# Patient Record
Sex: Female | Born: 1955 | Race: White | Hispanic: No | Smoking: Current every day smoker
Health system: Southern US, Community
[De-identification: ages and names within clinical notes are randomized; demographics above are authoritative.]

## PROBLEM LIST (undated history)

## (undated) DIAGNOSIS — Z8669 Personal history of other diseases of the nervous system and sense organs: Secondary | ICD-10-CM

## (undated) DIAGNOSIS — F419 Anxiety disorder, unspecified: Secondary | ICD-10-CM

## (undated) DIAGNOSIS — F32A Depression, unspecified: Secondary | ICD-10-CM

## (undated) DIAGNOSIS — E785 Hyperlipidemia, unspecified: Secondary | ICD-10-CM

## (undated) DIAGNOSIS — I25110 Atherosclerotic heart disease of native coronary artery with unstable angina pectoris: Secondary | ICD-10-CM

## (undated) DIAGNOSIS — K219 Gastro-esophageal reflux disease without esophagitis: Secondary | ICD-10-CM

## (undated) DIAGNOSIS — E669 Obesity, unspecified: Secondary | ICD-10-CM

## (undated) DIAGNOSIS — J431 Panlobular emphysema: Secondary | ICD-10-CM

## (undated) DIAGNOSIS — J449 Chronic obstructive pulmonary disease, unspecified: Secondary | ICD-10-CM

## (undated) HISTORY — DX: Atherosclerotic heart disease of native coronary artery with unstable angina pectoris: I25.110

## (undated) HISTORY — DX: Panlobular emphysema: J43.1

---

## 1998-03-03 ENCOUNTER — Ambulatory Visit (HOSPITAL_COMMUNITY): Admission: RE | Admit: 1998-03-03 | Discharge: 1998-03-03 | Payer: Self-pay | Admitting: Orthopedic Surgery

## 1998-07-16 ENCOUNTER — Encounter: Admission: RE | Admit: 1998-07-16 | Discharge: 1998-07-16 | Payer: Self-pay | Admitting: Infectious Diseases

## 2003-02-13 ENCOUNTER — Encounter: Admission: RE | Admit: 2003-02-13 | Discharge: 2003-02-13 | Payer: Self-pay | Admitting: Internal Medicine

## 2003-02-13 ENCOUNTER — Encounter: Payer: Self-pay | Admitting: Internal Medicine

## 2008-05-10 ENCOUNTER — Emergency Department (HOSPITAL_COMMUNITY): Admission: EM | Admit: 2008-05-10 | Discharge: 2008-05-10 | Payer: Self-pay | Admitting: Emergency Medicine

## 2009-04-21 ENCOUNTER — Other Ambulatory Visit: Admission: RE | Admit: 2009-04-21 | Discharge: 2009-04-21 | Payer: Self-pay | Admitting: Diagnostic Radiology

## 2009-04-21 ENCOUNTER — Encounter (INDEPENDENT_AMBULATORY_CARE_PROVIDER_SITE_OTHER): Payer: Self-pay | Admitting: Diagnostic Radiology

## 2009-04-21 ENCOUNTER — Encounter: Admission: RE | Admit: 2009-04-21 | Discharge: 2009-04-21 | Payer: Self-pay | Admitting: Family Medicine

## 2010-05-18 ENCOUNTER — Ambulatory Visit (HOSPITAL_COMMUNITY): Admission: RE | Admit: 2010-05-18 | Discharge: 2010-05-18 | Payer: Self-pay | Admitting: Family Medicine

## 2011-11-25 ENCOUNTER — Ambulatory Visit: Payer: BC Managed Care – PPO

## 2011-11-25 ENCOUNTER — Ambulatory Visit (INDEPENDENT_AMBULATORY_CARE_PROVIDER_SITE_OTHER): Payer: BC Managed Care – PPO | Admitting: Internal Medicine

## 2011-11-25 VITALS — BP 130/80 | HR 78 | Temp 98.5°F | Resp 16 | Ht <= 58 in | Wt 124.8 lb

## 2011-11-25 DIAGNOSIS — R071 Chest pain on breathing: Secondary | ICD-10-CM

## 2011-11-25 DIAGNOSIS — E785 Hyperlipidemia, unspecified: Secondary | ICD-10-CM

## 2011-11-25 DIAGNOSIS — F430 Acute stress reaction: Secondary | ICD-10-CM | POA: Insufficient documentation

## 2011-11-25 DIAGNOSIS — R059 Cough, unspecified: Secondary | ICD-10-CM

## 2011-11-25 DIAGNOSIS — K589 Irritable bowel syndrome without diarrhea: Secondary | ICD-10-CM

## 2011-11-25 DIAGNOSIS — F17200 Nicotine dependence, unspecified, uncomplicated: Secondary | ICD-10-CM

## 2011-11-25 DIAGNOSIS — Z720 Tobacco use: Secondary | ICD-10-CM

## 2011-11-25 DIAGNOSIS — R05 Cough: Secondary | ICD-10-CM

## 2011-11-25 DIAGNOSIS — F411 Generalized anxiety disorder: Secondary | ICD-10-CM

## 2011-11-25 DIAGNOSIS — K219 Gastro-esophageal reflux disease without esophagitis: Secondary | ICD-10-CM

## 2011-11-25 HISTORY — DX: Hyperlipidemia, unspecified: E78.5

## 2011-11-25 MED ORDER — AZITHROMYCIN 500 MG PO TABS: 500.0000 mg | ORAL_TABLET | Freq: Every day | ORAL | Status: AC

## 2011-11-25 MED ORDER — HYDROCODONE-HOMATROPINE 5-1.5 MG/5ML PO SYRP: 5.0000 mL | ORAL_SOLUTION | Freq: Four times a day (QID) | ORAL | Status: AC | PRN

## 2011-11-25 NOTE — Progress Notes (Signed)
  Subjective:    Patient ID: Colleen Fleming, female    DOB: [birth year], 56 y.o.   MRN: [id number]  HPI63 year old smoker comes for her first visit here with a history of 4 days of chest pain. For the last 2 days she hurts whenever she takes a deep breath or when she rolls over in the left side of her chest posterior laterally. She has no history of heart disease or hypertension but is on medicines for hyperlipidemia. She has less energy and a decreased appetite with this illness but notes no fever. She does report night sweats. She has had a cold for 2 or 3 weeks.    Review of SystemsBesides decreased appetite she has increased ureteral bowel symptoms. She has a diagnosis of IBS. She also is on omeprazole for GERD.     Objective:   Physical Exam Vital signs are stable HEENT is clear the neck is supple with no adenopathy The lungs are clear except diminished breath sounds in the left axilla She is very tender to palpation along the left posterolateral chest wall There is no ecchymosis or rib defect felt The heart is regular without murmurs rubs or gallops    [facility] reading (PRIMARY) by  Dr. Merla Riches: no clear infiltrate ? Fx 6th rib in axillary line     Assessment & Plan:  Problem #1 chest wall pain Problem #2 cough secondary to bronchitis  Plan Zithromax and Hycodan with close followup

## 2012-10-03 DIAGNOSIS — C3412 Malignant neoplasm of upper lobe, left bronchus or lung: Secondary | ICD-10-CM

## 2012-10-03 HISTORY — DX: Malignant neoplasm of upper lobe, left bronchus or lung: C34.12

## 2013-08-11 DIAGNOSIS — R569 Unspecified convulsions: Secondary | ICD-10-CM | POA: Insufficient documentation

## 2013-08-21 DIAGNOSIS — C3490 Malignant neoplasm of unspecified part of unspecified bronchus or lung: Secondary | ICD-10-CM | POA: Insufficient documentation

## 2015-01-03 DIAGNOSIS — E669 Obesity, unspecified: Secondary | ICD-10-CM | POA: Insufficient documentation

## 2016-10-03 DIAGNOSIS — M47814 Spondylosis without myelopathy or radiculopathy, thoracic region: Secondary | ICD-10-CM

## 2016-10-03 HISTORY — DX: Spondylosis without myelopathy or radiculopathy, thoracic region: M47.814

## 2021-01-30 ENCOUNTER — Inpatient Hospital Stay (HOSPITAL_COMMUNITY)
Admission: EM | Admit: 2021-01-30 | Discharge: 2021-02-04 | DRG: 247 | Disposition: A | Discharge status: Home Health | Payer: Medicaid Other | Attending: Cardiology | Admitting: Cardiology

## 2021-01-30 ENCOUNTER — Encounter: Payer: Self-pay | Admitting: Cardiology

## 2021-01-30 ENCOUNTER — Inpatient Hospital Stay (HOSPITAL_COMMUNITY)
Admission: EM | Disposition: A | Discharge status: Home Health | Payer: Self-pay | Source: Home / Self Care | Attending: Cardiology

## 2021-01-30 DIAGNOSIS — I25110 Atherosclerotic heart disease of native coronary artery with unstable angina pectoris: Secondary | ICD-10-CM | POA: Diagnosis not present

## 2021-01-30 DIAGNOSIS — J431 Panlobular emphysema: Secondary | ICD-10-CM

## 2021-01-30 DIAGNOSIS — Z809 Family history of malignant neoplasm, unspecified: Secondary | ICD-10-CM

## 2021-01-30 DIAGNOSIS — F17210 Nicotine dependence, cigarettes, uncomplicated: Secondary | ICD-10-CM | POA: Diagnosis present

## 2021-01-30 DIAGNOSIS — Z85118 Personal history of other malignant neoplasm of bronchus and lung: Secondary | ICD-10-CM

## 2021-01-30 DIAGNOSIS — Z880 Allergy status to penicillin: Secondary | ICD-10-CM

## 2021-01-30 DIAGNOSIS — Z79899 Other long term (current) drug therapy: Secondary | ICD-10-CM

## 2021-01-30 DIAGNOSIS — J449 Chronic obstructive pulmonary disease, unspecified: Secondary | ICD-10-CM | POA: Diagnosis present

## 2021-01-30 DIAGNOSIS — I313 Pericardial effusion (noninflammatory): Secondary | ICD-10-CM | POA: Diagnosis not present

## 2021-01-30 DIAGNOSIS — I498 Other specified cardiac arrhythmias: Secondary | ICD-10-CM | POA: Diagnosis present

## 2021-01-30 DIAGNOSIS — Z923 Personal history of irradiation: Secondary | ICD-10-CM

## 2021-01-30 DIAGNOSIS — G40909 Epilepsy, unspecified, not intractable, without status epilepticus: Secondary | ICD-10-CM | POA: Diagnosis present

## 2021-01-30 DIAGNOSIS — I2102 ST elevation (STEMI) myocardial infarction involving left anterior descending coronary artery: Secondary | ICD-10-CM | POA: Diagnosis not present

## 2021-01-30 DIAGNOSIS — K589 Irritable bowel syndrome without diarrhea: Secondary | ICD-10-CM | POA: Diagnosis present

## 2021-01-30 DIAGNOSIS — E876 Hypokalemia: Secondary | ICD-10-CM | POA: Diagnosis present

## 2021-01-30 DIAGNOSIS — I2510 Atherosclerotic heart disease of native coronary artery without angina pectoris: Secondary | ICD-10-CM | POA: Diagnosis present

## 2021-01-30 DIAGNOSIS — F419 Anxiety disorder, unspecified: Secondary | ICD-10-CM | POA: Diagnosis present

## 2021-01-30 DIAGNOSIS — I2109 ST elevation (STEMI) myocardial infarction involving other coronary artery of anterior wall: Secondary | ICD-10-CM | POA: Diagnosis present

## 2021-01-30 DIAGNOSIS — Z23 Encounter for immunization: Secondary | ICD-10-CM | POA: Diagnosis not present

## 2021-01-30 DIAGNOSIS — E785 Hyperlipidemia, unspecified: Secondary | ICD-10-CM | POA: Diagnosis present

## 2021-01-30 DIAGNOSIS — F32A Depression, unspecified: Secondary | ICD-10-CM | POA: Diagnosis present

## 2021-01-30 DIAGNOSIS — I950 Idiopathic hypotension: Secondary | ICD-10-CM | POA: Diagnosis not present

## 2021-01-30 DIAGNOSIS — E7800 Pure hypercholesterolemia, unspecified: Secondary | ICD-10-CM | POA: Diagnosis present

## 2021-01-30 DIAGNOSIS — Z9221 Personal history of antineoplastic chemotherapy: Secondary | ICD-10-CM | POA: Diagnosis not present

## 2021-01-30 DIAGNOSIS — K219 Gastro-esophageal reflux disease without esophagitis: Secondary | ICD-10-CM | POA: Diagnosis present

## 2021-01-30 DIAGNOSIS — M47814 Spondylosis without myelopathy or radiculopathy, thoracic region: Secondary | ICD-10-CM | POA: Diagnosis present

## 2021-01-30 DIAGNOSIS — Z20822 Contact with and (suspected) exposure to covid-19: Secondary | ICD-10-CM | POA: Diagnosis present

## 2021-01-30 DIAGNOSIS — Z955 Presence of coronary angioplasty implant and graft: Secondary | ICD-10-CM

## 2021-01-30 DIAGNOSIS — I213 ST elevation (STEMI) myocardial infarction of unspecified site: Secondary | ICD-10-CM | POA: Diagnosis present

## 2021-01-30 HISTORY — DX: Personal history of other diseases of the nervous system and sense organs: Z86.69

## 2021-01-30 HISTORY — DX: Depression, unspecified: F32.A

## 2021-01-30 HISTORY — DX: Anxiety disorder, unspecified: F41.9

## 2021-01-30 HISTORY — DX: Gastro-esophageal reflux disease without esophagitis: K21.9

## 2021-01-30 HISTORY — DX: Chronic obstructive pulmonary disease, unspecified: J44.9

## 2021-01-30 HISTORY — PX: LEFT HEART CATH AND CORONARY ANGIOGRAPHY: CATH118249

## 2021-01-30 HISTORY — DX: Hyperlipidemia, unspecified: E78.5

## 2021-01-30 HISTORY — DX: Obesity, unspecified: E66.9

## 2021-01-30 HISTORY — PX: CORONARY/GRAFT ACUTE MI REVASCULARIZATION: CATH118305

## 2021-01-30 HISTORY — DX: ST elevation (STEMI) myocardial infarction involving left anterior descending coronary artery: I21.02

## 2021-01-30 LAB — POCT I-STAT, CHEM 8
BUN: <3 mg/dL — ABNORMAL LOW (ref 8–23)
BUN: <3 mg/dL — ABNORMAL LOW (ref 8–23)
Calcium, Ion: 1.15 mmol/L (ref 1.15–1.40)
Calcium, Ion: 1.17 mmol/L (ref 1.15–1.40)
Chloride: 100 mmol/L (ref 98–111)
Chloride: 88 mmol/L — ABNORMAL LOW (ref 98–111)
Creatinine, Ser: 0.4 mg/dL — ABNORMAL LOW (ref 0.44–1.00)
Creatinine, Ser: 0.6 mg/dL (ref 0.44–1.00)
Glucose, Bld: 127 mg/dL — ABNORMAL HIGH (ref 70–99)
Glucose, Bld: 155 mg/dL — ABNORMAL HIGH (ref 70–99)
HCT: 34 % — ABNORMAL LOW (ref 36.0–46.0)
HCT: 38 % (ref 36.0–46.0)
Hemoglobin: 11.6 g/dL — ABNORMAL LOW (ref 12.0–15.0)
Hemoglobin: 12.9 g/dL (ref 12.0–15.0)
Potassium: 2.1 mmol/L — CL (ref 3.5–5.1)
Potassium: 2.7 mmol/L — CL (ref 3.5–5.1)
Sodium: 125 mmol/L — ABNORMAL LOW (ref 135–145)
Sodium: 135 mmol/L (ref 135–145)
TCO2: 19 mmol/L — ABNORMAL LOW (ref 22–32)
TCO2: 19 mmol/L — ABNORMAL LOW (ref 22–32)

## 2021-01-30 LAB — CBC
HCT: 35.5 % — ABNORMAL LOW (ref 36.0–46.0)
Hemoglobin: 12.6 g/dL (ref 12.0–15.0)
MCH: 31.8 pg (ref 26.0–34.0)
MCHC: 35.5 g/dL (ref 30.0–36.0)
MCV: 89.6 fL (ref 80.0–100.0)
Platelets: 340 10*3/uL (ref 150–400)
RBC: 3.96 MIL/uL (ref 3.87–5.11)
RDW: 12.1 % (ref 11.5–15.5)
WBC: 14.1 10*3/uL — ABNORMAL HIGH (ref 4.0–10.5)
nRBC: 0 % (ref 0.0–0.2)

## 2021-01-30 LAB — HIV ANTIBODY (ROUTINE TESTING W REFLEX): HIV Screen 4th Generation wRfx: NONREACTIVE

## 2021-01-30 LAB — TROPONIN I (HIGH SENSITIVITY): Troponin I (High Sensitivity): >27000 ng/L (ref <18)

## 2021-01-30 LAB — RESP PANEL BY RT-PCR (FLU A&B, COVID) ARPGX2
Influenza A by PCR: NEGATIVE
Influenza B by PCR: NEGATIVE
SARS Coronavirus 2 by RT PCR: NEGATIVE

## 2021-01-30 LAB — POCT ACTIVATED CLOTTING TIME: Activated Clotting Time: 285 seconds

## 2021-01-30 LAB — CREATININE, SERUM
Creatinine, Ser: 0.73 mg/dL (ref 0.44–1.00)
GFR, Estimated: >60 mL/min (ref >60)

## 2021-01-30 SURGERY — CORONARY/GRAFT ACUTE MI REVASCULARIZATION
Anesthesia: LOCAL

## 2021-01-30 MED ORDER — SODIUM CHLORIDE 0.9% FLUSH
10.0000 mL | Freq: Two times a day (BID) | INTRAVENOUS | Status: DC
  Administered 2021-01-30 – 2021-02-03 (×8): 10 mL

## 2021-01-30 MED ORDER — SODIUM CHLORIDE 0.9% FLUSH
3.0000 mL | Freq: Two times a day (BID) | INTRAVENOUS | Status: DC
  Administered 2021-01-31 – 2021-02-03 (×6): 3 mL via INTRAVENOUS

## 2021-01-30 MED ORDER — TIROFIBAN HCL IN NACL 5-0.9 MG/100ML-% IV SOLN
INTRAVENOUS | Status: AC | PRN
  Administered 2021-01-30: 0.15 ug/kg/min via INTRAVENOUS

## 2021-01-30 MED ORDER — TIROFIBAN (AGGRASTAT) BOLUS VIA INFUSION
INTRAVENOUS | Status: DC | PRN
  Administered 2021-01-30: 1075 ug via INTRAVENOUS

## 2021-01-30 MED ORDER — CHLORHEXIDINE GLUCONATE CLOTH 2 % EX PADS
6.0000 | MEDICATED_PAD | Freq: Every day | CUTANEOUS | Status: DC
  Administered 2021-01-30 – 2021-02-03 (×5): 6 via TOPICAL

## 2021-01-30 MED ORDER — SODIUM CHLORIDE 0.9 % IV SOLN: INTRAVENOUS | Status: AC

## 2021-01-30 MED ORDER — LABETALOL HCL 5 MG/ML IV SOLN: 10.0000 mg | INTRAVENOUS | Status: AC | PRN

## 2021-01-30 MED ORDER — ACETAMINOPHEN 325 MG PO TABS: 650.0000 mg | ORAL_TABLET | ORAL | Status: DC | PRN

## 2021-01-30 MED ORDER — NITROGLYCERIN 0.4 MG SL SUBL: 0.4000 mg | SUBLINGUAL_TABLET | SUBLINGUAL | Status: DC | PRN

## 2021-01-30 MED ORDER — SODIUM CHLORIDE 0.9 % IV SOLN: 250.0000 mL | INTRAVENOUS | Status: DC | PRN

## 2021-01-30 MED ORDER — IOHEXOL 350 MG/ML SOLN
INTRAVENOUS | Status: AC
  Filled 2021-01-30: qty 1

## 2021-01-30 MED ORDER — IOHEXOL 350 MG/ML SOLN
INTRAVENOUS | Status: DC | PRN
Start: 2021-01-30 — End: 2021-01-30
  Administered 2021-01-30: 125 mL

## 2021-01-30 MED ORDER — HEPARIN (PORCINE) IN NACL 1000-0.9 UT/500ML-% IV SOLN
INTRAVENOUS | Status: AC
  Filled 2021-01-30: qty 1000

## 2021-01-30 MED ORDER — SODIUM CHLORIDE 0.9% FLUSH: 10.0000 mL | INTRAVENOUS | Status: DC | PRN

## 2021-01-30 MED ORDER — ONDANSETRON HCL 4 MG/2ML IJ SOLN
INTRAMUSCULAR | Status: AC
  Filled 2021-01-30: qty 2

## 2021-01-30 MED ORDER — METOPROLOL TARTRATE 12.5 MG HALF TABLET
12.5000 mg | ORAL_TABLET | Freq: Two times a day (BID) | ORAL | Status: DC
  Administered 2021-01-30 – 2021-01-31 (×3): 12.5 mg via ORAL
  Filled 2021-01-30 (×3): qty 1

## 2021-01-30 MED ORDER — VERAPAMIL HCL 2.5 MG/ML IV SOLN
INTRAVENOUS | Status: AC
  Filled 2021-01-30: qty 2

## 2021-01-30 MED ORDER — HEPARIN SODIUM (PORCINE) 5000 UNIT/ML IJ SOLN
5000.0000 [IU] | Freq: Three times a day (TID) | INTRAMUSCULAR | Status: DC
  Filled 2021-01-30 (×2): qty 1

## 2021-01-30 MED ORDER — NITROGLYCERIN 1 MG/10 ML FOR IR/CATH LAB
INTRA_ARTERIAL | Status: AC
  Filled 2021-01-30: qty 10

## 2021-01-30 MED ORDER — ASPIRIN EC 81 MG PO TBEC
81.0000 mg | DELAYED_RELEASE_TABLET | Freq: Every day | ORAL | Status: DC
  Administered 2021-01-31 – 2021-02-04 (×5): 81 mg via ORAL
  Filled 2021-01-30 (×5): qty 1

## 2021-01-30 MED ORDER — TICAGRELOR 90 MG PO TABS
ORAL_TABLET | ORAL | Status: AC
  Filled 2021-01-30: qty 2

## 2021-01-30 MED ORDER — HYDRALAZINE HCL 20 MG/ML IJ SOLN: 10.0000 mg | INTRAMUSCULAR | Status: AC | PRN

## 2021-01-30 MED ORDER — VENLAFAXINE HCL 37.5 MG PO TABS: 37.5000 mg | ORAL_TABLET | Freq: Once | ORAL | Status: DC

## 2021-01-30 MED ORDER — HEPARIN SODIUM (PORCINE) 1000 UNIT/ML IJ SOLN
INTRAMUSCULAR | Status: AC
  Filled 2021-01-30: qty 1

## 2021-01-30 MED ORDER — ATROPINE SULFATE 1 MG/10ML IJ SOSY
PREFILLED_SYRINGE | INTRAMUSCULAR | Status: AC
  Filled 2021-01-30: qty 10

## 2021-01-30 MED ORDER — POTASSIUM CHLORIDE 10 MEQ/50ML IV SOLN
10.0000 meq | INTRAVENOUS | Status: AC
  Administered 2021-01-30 (×3): 10 meq via INTRAVENOUS
  Filled 2021-01-30 (×3): qty 50

## 2021-01-30 MED ORDER — ATORVASTATIN CALCIUM 80 MG PO TABS
80.0000 mg | ORAL_TABLET | Freq: Every day | ORAL | Status: DC
  Administered 2021-01-30 – 2021-02-04 (×6): 80 mg via ORAL
  Filled 2021-01-30 (×6): qty 1

## 2021-01-30 MED ORDER — TIROFIBAN HCL IN NACL 5-0.9 MG/100ML-% IV SOLN: 0.1500 ug/kg/min | INTRAVENOUS | Status: AC

## 2021-01-30 MED ORDER — MIDAZOLAM HCL 2 MG/2ML IJ SOLN
INTRAMUSCULAR | Status: DC | PRN
Start: 2021-01-30 — End: 2021-01-30
  Administered 2021-01-30: 1 mg via INTRAVENOUS

## 2021-01-30 MED ORDER — TIROFIBAN HCL IN NACL 5-0.9 MG/100ML-% IV SOLN
INTRAVENOUS | Status: AC
  Filled 2021-01-30: qty 100

## 2021-01-30 MED ORDER — FENTANYL CITRATE (PF) 100 MCG/2ML IJ SOLN
INTRAMUSCULAR | Status: AC
  Filled 2021-01-30: qty 2

## 2021-01-30 MED ORDER — FENTANYL CITRATE (PF) 100 MCG/2ML IJ SOLN
INTRAMUSCULAR | Status: DC | PRN
  Administered 2021-01-30: 25 ug via INTRAVENOUS

## 2021-01-30 MED ORDER — TICAGRELOR 90 MG PO TABS
ORAL_TABLET | ORAL | Status: DC | PRN
  Administered 2021-01-30: 180 mg via ORAL

## 2021-01-30 MED ORDER — LIDOCAINE HCL (PF) 1 % IJ SOLN
INTRAMUSCULAR | Status: AC
  Filled 2021-01-30: qty 30

## 2021-01-30 MED ORDER — MIDAZOLAM HCL 2 MG/2ML IJ SOLN
INTRAMUSCULAR | Status: AC
  Filled 2021-01-30: qty 2

## 2021-01-30 MED ORDER — MORPHINE SULFATE (PF) 2 MG/ML IV SOLN: 1.0000 mg | INTRAVENOUS | Status: DC | PRN

## 2021-01-30 MED ORDER — LIDOCAINE HCL (PF) 1 % IJ SOLN
INTRAMUSCULAR | Status: DC | PRN
  Administered 2021-01-30: 10 mL
  Administered 2021-01-30: 15 mL

## 2021-01-30 MED ORDER — ONDANSETRON HCL 4 MG/2ML IJ SOLN: 4.0000 mg | Freq: Four times a day (QID) | INTRAMUSCULAR | Status: DC | PRN

## 2021-01-30 MED ORDER — TICAGRELOR 90 MG PO TABS
90.0000 mg | ORAL_TABLET | Freq: Two times a day (BID) | ORAL | Status: DC
  Administered 2021-01-31 – 2021-02-04 (×9): 90 mg via ORAL
  Filled 2021-01-30 (×9): qty 1

## 2021-01-30 MED ORDER — DOPAMINE-DEXTROSE 3.2-5 MG/ML-% IV SOLN
INTRAVENOUS | Status: AC
  Filled 2021-01-30: qty 250

## 2021-01-30 MED ORDER — HEPARIN SODIUM (PORCINE) 1000 UNIT/ML IJ SOLN
INTRAMUSCULAR | Status: DC | PRN
Start: 2021-01-30 — End: 2021-01-30
  Administered 2021-01-30: 2000 [IU] via INTRAVENOUS

## 2021-01-30 MED ORDER — ONDANSETRON HCL 4 MG/2ML IJ SOLN
INTRAMUSCULAR | Status: DC | PRN
  Administered 2021-01-30: 4 mg via INTRAVENOUS

## 2021-01-30 MED ORDER — SODIUM CHLORIDE 0.9% FLUSH
3.0000 mL | INTRAVENOUS | Status: DC | PRN
  Administered 2021-02-03: 3 mL via INTRAVENOUS

## 2021-01-30 SURGICAL SUPPLY — 18 items
BALLN SAPPHIRE 2.0X15 (BALLOONS) ×2
BALLN SAPPHIRE ~~LOC~~ 2.75X12 (BALLOONS) ×2 IMPLANT
BALLOON SAPPHIRE 2.0X15 (BALLOONS) ×1 IMPLANT
CATH INFINITI 5FR MULTPACK ANG (CATHETERS) ×2 IMPLANT
CATH VISTA GUIDE 6FR XBLAD3.5 (CATHETERS) ×2 IMPLANT
KIT CV MULTILUMEN 7FR 20 (SET/KITS/TRAYS/PACK) ×2
KIT CV MULTILUMEN 7FR 20 SUB (SET/KITS/TRAYS/PACK) ×1 IMPLANT
KIT ENCORE 26 ADVANTAGE (KITS) ×2 IMPLANT
KIT HEART LEFT (KITS) ×2 IMPLANT
PACK CARDIAC CATHETERIZATION (CUSTOM PROCEDURE TRAY) ×2 IMPLANT
SHEATH PINNACLE 6F 10CM (SHEATH) ×2 IMPLANT
STENT SYNERGY XD 2.50X16 (Permanent Stent) ×1 IMPLANT
SYNERGY XD 2.50X16 (Permanent Stent) ×2 IMPLANT
TRANSDUCER W/STOPCOCK (MISCELLANEOUS) ×2 IMPLANT
TUBING CIL FLEX 10 FLL-RA (TUBING) ×2 IMPLANT
WIRE ASAHI PROWATER 180CM (WIRE) ×2 IMPLANT
WIRE EMERALD 3MM-J .035X150CM (WIRE) ×2 IMPLANT
WIRE RUNTHROUGH .014X180CM (WIRE) ×2 IMPLANT

## 2021-01-30 NOTE — Progress Notes (Signed)
   01/30/21 1818  Clinical Encounter Type  Visited With Patient not available  Visit Type Code  Referral From Nurse  Consult/Referral To Chaplain   Chaplain responded to Code STEMI. Pt being treated in the ED and no support person present. Chaplain remains available.   This note was prepared by Chaplain Resident, Dante Gang, MDiv. Chaplain remains available as needed through the on-call pager: [phone].

## 2021-01-30 NOTE — Progress Notes (Signed)
I called Lab for results of troponin sent at 2115.  Troponin greater than 27,000.  Patient is pain free.  Next troponin due at 0100 Paged Dr. Ellyn Hack and informed him of critical level.  No new orders will continue to monitor troponin level.

## 2021-01-30 NOTE — ED Notes (Signed)
Given 4000mg  heparin per MD verbal order

## 2021-01-30 NOTE — H&P (Addendum)
Cardiology Admission History and Physical:   Patient ID: Colleen Fleming MRN: [id number]; DOB: [birth year]   Admission date: 01/30/2021  PCP:  No primary care provider on file.   [place]  Cardiologist:  No primary care provider on file.  Advanced Practice Provider:  No care team member to display Electrophysiologist:  None       Chief Complaint:  Chest pain/STEMI  Patient Profile:   Colleen Fleming is a 65 y.o. female with history of lung cancer status post chemo and radiation on the left side, COPD, heavy tobacco abuse with a greater than 50-pack-year history and now currently smoking 2 packs/day who presents as a code STEMI.  History of Present Illness:   [name] 65 year old female with history of lung cancer status post chemo and radiation on the left side, COPD, heavy tobacco use, GERD, depression who had acute onset chest pain roughly 3 hours prior to presentation.  Noted the chest pain to be left-sided and substernal.  Progressively worsening.  Also having diaphoresis and lightheadedness.  No other acute symptoms.  Has had a cough persistently for over a year.  Notably was not not seen by a provider since 2018 and has been lost to follow-up.  Reported that she has not been taking any of her chronic medications.  Brought in via EMS.  On EKG to have ST elevation in anterior (~4-5 mm) and lateral leads (I & aVL ~3 mm).  With reciprocal depressions.  Blood pressures in the systolics 06T to 016W.  Heart rates intermittently dropping to the 50s to 60s with 2-1 AV block on telemetry.  Having active 8/10 chest pain down to the emergency department.  Cath Lab activated and brought up for intervention.   Past Medical History:  Diagnosis Date  . Anxiety and depression   . COPD (chronic obstructive pulmonary disease) (HCC)    Long-term smoker greater than 35 years 1 to 2 pack a day.  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 GERD (gastroesophageal reflux disease)   . History of seizure disorder   . Hyperlipidemia   . Obesity   . Small cell lung cancer, left upper lobe ([place]) 2014   Treated with radiation and chemotherapy [facility])   Family history unable to be obtained t given acuity. Noncontributory.   Medications Prior to Admission: Prior to Admission medications   Medication Sig Start Date End Date Taking? Authorizing Provider  hyoscyamine (ANASPAZ) 0.125 MG TBDP Place under the tongue.    [provider]  lovastatin (MEVACOR) 20 MG tablet Take 20 mg by mouth at bedtime.    [provider]  multivitamin [facility]) per tablet Take 1 tablet by mouth daily.    [provider]  omeprazole (PRILOSEC) 20 MG capsule Take 20 mg by mouth daily.    [provider]  venlafaxine (EFFEXOR) 37.5 MG tablet Take 37.5 mg by mouth once.    [provider]  -> Per her report, she has not been taking the medications for quite some time now.  Allergies:    Allergies  Allergen Reactions  . Penicillins Hives    Social History:   Social History   Socioeconomic History  . Marital status: Divorced    Spouse name: Not on file  . Number of children: 2  . Years of education: Not on file  . Highest education level: Not on file  Occupational History  . Not  on file  Tobacco Use  . Smoking status: Current Every Day Smoker    Packs/day: 1.50    Years: 40.00    Pack years: 60.00  . Smokeless tobacco: Never Used  Substance and Sexual Activity  . Alcohol use: Not Currently  . Drug use: Never  . Sexual activity: Not on file  Other Topics Concern  . Not on file  Social History Narrative   Currently divorced.  No longer working.  Previously worked in [employer].   Lives near [place].   Still smokes about 2 packs a day.   Social Determinants of Health   Financial Resource Strain: Not on file  Food Insecurity: Not on file  Transportation Needs:  Not on file  Physical Activity: Not on file  Stress: Not on file  Social Connections: Not on file  Intimate Partner Violence: Not on file   . Social History   Social History Narrative   Currently divorced.  No longer working.  Previously worked in [employer].   Lives near [place].   Still smokes about 2 packs a day.     Family History:   The patient's family history includes Cancer in her paternal aunt and paternal grandmother.    ROS:  Please see the history of present illness.  All other ROS reviewed and negative.     Physical Exam/Data:   Vitals:   01/30/21 2100 01/30/21 2115 01/30/21 2130 01/30/21 2200  BP: (!) 103/91 111/86 117/82 120/83  Pulse: 95 (!) 117 95 76  Resp: (!) 27 (!) 24 (!) 27 (!) 27  Temp:      TempSrc:      SpO2: 95% 93% 100% 98%   No intake or output data in the 24 hours ending 01/30/21 2232 Last 3 Weights 11/25/2011  Weight (lbs) 124 lb 12.8 oz  Weight (kg) 56.609 kg     There is no height or weight on file to calculate BMI.  Per her report, 4 feet, 9 inches General:  Pale appearing and in pain HEENT: normal Lymph: no adenopathy Neck: no JVD Endocrine:  No thryomegaly Vascular: No carotid bruits; FA pulses 2+ bilaterally without bruits  Cardiac:  normal S1, S2; RRR; no murmur  Lungs:  clear to auscultation bilaterally, no wheezing, rhonchi or rales  Abd: soft, nontender, no hepatomegaly  Ext: no edema Musculoskeletal:  No deformities, BUE and BLE strength normal and equal Skin: warm and dry  Neuro:  CNs 2-12 intact, no focal abnormalities noted Psych:  Normal affect    EKG:  The ECG that was done  was personally reviewed and demonstrates ST elevation anterior leads  Relevant CV Studies: none  Laboratory Data:  High Sensitivity Troponin:  No results for input(s): TROPONINIHS in the last 720 hours.    Chemistry Recent Labs  Lab 01/30/21 1911 01/30/21 1924  NA 135 125*  K 2.7* 2.1*  CL 100 88*  GLUCOSE 155* 127*   BUN <3* <3*  CREATININE 0.60 0.40*    No results for input(s): PROT, ALBUMIN, AST, ALT, ALKPHOS, BILITOT in the last 168 hours. Hematology Recent Labs  Lab 01/30/21 1924 01/30/21 2115  WBC  --  14.1*  RBC  --  3.96  HGB 11.6* 12.6  HCT 34.0* 35.5*  MCV  --  89.6  MCH  --  31.8  MCHC  --  35.5  RDW  --  12.1  PLT  --  340   BNPNo results for input(s): BNP, PROBNP  in the last 168 hours.  DDimer No results for input(s): DDIMER in the last 168 hours.   Radiology/Studies:  CARDIAC CATHETERIZATION  Result Date: 01/30/2021  The left ventricular ejection fraction is 45-50% by visual estimate. Mid to apical anterior hypokinesis.  LV end diastolic pressure is mildly elevated at 13 mmHg, with systolic 98 mmHg.  There is trivial (1+) mitral regurgitation.  --------------------------------------------  Culprit lesion: Prox LAD lesion is 99% stenosed. (Calcified, thrombotic)  A drug-eluting stent was successfully placed using a SYNERGY XD 2.50X16 -> postdilated 2.8 mm  Post intervention, there is a 0% residual stenosis.  Dist LAD lesion is 80% stenosed -> there is TIMI 0 flow to the apex, post PCI TIMI II flow, but still notable irregularities distally, likely distal embolism.  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  Distal LAD has TIMI I 0 flow.  Successful DES PCI of the LAD using a synergy DES 2.5 mm x 16 mm postdilated to 2.8 mm.  Otherwise angiographically normal coronary arteries with exception of the apical LAD.  Mild mid to apical anterior hypokinesis on LV gram with mildly reduced EF of roughly 45%.  Upper limit of normal LVEDP.  Borderline hypotension with systolic pressures ranging in the 95 to 105 mmHg range -> no evidence of shock  Severe hypOkalemia, i-STAT potassium read is 2.1, labs from ER read is 2.7.  Resolution of ventricular bigeminy. RECOMMENDATION  Admit to CVICU  Will run 3 rounds  of IV potassium and recheck in 3 hours.  Continue IV Aggrastat for another 1 hour post PCI.  Check 2D echocardiogram, A1c and lipid panel the morning.  High-dose high intensity statin  Will attempt to start low-dose beta-blocker given tachycardia, however with borderline pressures, may need to hold.  Cardiac rehab consult  Smoking cessation consult Glenetta Hew, MD    Assessment and Plan:   1. STEMI. Brought to the Cath lab and notably has LAD occlusion that will undergo PCI at this time. Loaded with ASA and started on heparin in the ED. We will continue daily asa, HD statin. No BB for now given AVB in ED. Ordered echo for post cath. Will monitor closely post procedure.  Cycle troponin levels 2. Hypokalemia-potassium 2.1 on i-STAT, 2.7 on ER labs.  Will replete.   Risk Assessment/Risk Scores:     TIMI Risk Score for ST  Elevation MI:   The patient's TIMI risk score is 8, which indicates a 26.8% risk of all cause mortality at 30 days.        Severity of Illness: The appropriate patient status for this patient is INPATIENT. Inpatient status is judged to be reasonable and necessary in order to provide the required intensity of service to ensure the patient's safety. The patient's presenting symptoms, physical exam findings, and initial radiographic and laboratory data in the context of their chronic comorbidities is felt to place them at high risk for further clinical deterioration. Furthermore, it is not anticipated that the patient will be medically stable for discharge from the hospital within 2 midnights of admission. The following factors support the patient status of inpatient.   " The patient's presenting symptoms include chest pain. " The worrisome physical exam findings include chest pain, SOB, distress. " The initial radiographic and laboratory data are worrisome because of STEMI - anterior ST elevations on EKG " The chronic co-morbidities include lung cancer, COPD, heavy  tobacco abuse.   * I certify  that at the point of admission it is my clinical judgment that the patient will require inpatient hospital care spanning beyond 2 midnights from the point of admission due to high intensity of service, high risk for further deterioration and high frequency of surveillance required.*    For questions or updates, please contact [place] Please consult [email or web address] for contact info under     Signed, Doyne Keel, MD  01/30/2021 7:16 PM   ATTENDING ATTESTATION  I have seen, examined and evaluated the patient this evening along with Dr. Marcelle Smiling.  After reviewing all the available data and chart, we discussed the patients laboratory, study & physical findings as well as symptoms in detail. I agree with his findings, examination as well as impression recommendations as per our discussion.    Attending adjustments noted in italics.   I personally performed the chart review including past medical history and medication review.  Also performed the interview and examination along with Dr.Narcisse.  With significant anterior ST elevation on EKG and ectopy on rhythm, she is taken to the cardiac catheterization lab with presumed anterior/LAD involvement- > confirmed on cardiac cath-LAD PCI.Marland Kitchen  We will need to monitor on telemetry post cath.  Labs been drawn, not available until i-STAT in the Cath Lab where she was found to have hypokalemia. . Will need to cycle troponin levels and check echocardiogram the morning. Potassium supplementation has been written for. Take lipid panel start statin Attempt to start low-dose beta-blocker tonight. Needs cardiac rehab and smoking cessation counseling.    Glenetta Hew, M.D     Glenetta Hew, MD., M.S. Interventional Cardiologist   [address] [place], [place] [id number] 01/30/2021 10:32 PM

## 2021-01-30 NOTE — Progress Notes (Signed)
Dr. Ellyn Hack at bedside.  Orders to stop aggrastat in one hour at 2130.  Troponin stat, another at 0100 and last troponin morning labs at 0500.

## 2021-01-30 NOTE — Progress Notes (Signed)
Patient admit to unit from cath lab via bed.  Patient A&Ox4 able to verbalize all needs. Aggratstat infusing per MD's orders.   Patient has sheath in right groin.  Right groin is benign Patient has  +2 bilateral pedal pulses.  Right leg warm and dry.  Patient denies all pain.  Patient has sensation to right leg cap refill <3 seconds.  Patient orient to room and call bell.  Patient instructed that she must lay flat until sheath has been from  Right groin for 6 hours.  Patient verbalizes understanding of all instructions.

## 2021-01-30 NOTE — ED Triage Notes (Signed)
Nausea, vomiting since this AM. Active chest pain since 1-2 hrs. Code STEMI. A&Ox4.

## 2021-01-30 NOTE — Progress Notes (Signed)
I called lab department asking for results of troponins.  They have not resulted they are still working on it.

## 2021-01-30 NOTE — ED Provider Notes (Signed)
[facility] CATH LAB Provider Note   CSN: [id number] Arrival date & time: 01/30/21  [place]     History Chief Complaint  Patient presents with  . Code STEMI    Colleen Fleming is a 65 y.o. female presented emerge department as a code STEMI.  The patient reports onset of chest pressure earlier today.  Describes like someone sitting on her chest.  Intensity is 8 out of 10.  She was given aspirin by EMS.  She is never had this pain before.  She denies any history of cardiac disease, MI, cardiac stents.  She reports she is a smoker.  She denies history of hypertension.  She does report high cholesterol.  She says she does not take any medications.  HPI     Past Medical History:  Diagnosis Date  . Anxiety and depression   . COPD (chronic obstructive pulmonary disease) (HCC)    Long-term smoker greater than 35 years 1 to 2 pack a day.  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 GERD (gastroesophageal reflux disease)   . History of seizure disorder   . Hyperlipidemia   . Obesity   . Small cell lung cancer, left upper lobe ([place]) 2014   Treated with radiation and chemotherapy [facility])    Patient Active Problem List   Diagnosis Date Noted  . STEMI (ST elevation myocardial infarction) ([place]) 01/30/2021  . GERD (gastroesophageal reflux disease) 11/25/2011  . Nicotine abuse 11/25/2011  . Hyperlipemia 11/25/2011  . IBS (irritable bowel syndrome) 11/25/2011  . Anxiety as acute reaction to exceptional stress 11/25/2011     OB History   No obstetric history on file.     Family History  Problem Relation Age of Onset  . Cancer Paternal Grandmother   . Cancer Paternal Aunt     Social History   Tobacco Use  . Smoking status: Current Every Day Smoker    Packs/day: 1.50    Years: 40.00    Pack years: 60.00  . Smokeless tobacco: Never Used  Substance Use Topics  . Alcohol use: Not Currently  . Drug use: Never     Home Medications Prior to Admission medications   Medication Sig Start Date End Date Taking? Authorizing Provider  hyoscyamine (ANASPAZ) 0.125 MG TBDP Place under the tongue.    [provider]  lovastatin (MEVACOR) 20 MG tablet Take 20 mg by mouth at bedtime.    [provider]  multivitamin [facility]) per tablet Take 1 tablet by mouth daily.    [provider]  omeprazole (PRILOSEC) 20 MG capsule Take 20 mg by mouth daily.    [provider]  venlafaxine (EFFEXOR) 37.5 MG tablet Take 37.5 mg by mouth once.    [provider]    Allergies    Penicillins  Review of Systems   Review of Systems  Constitutional: Negative for chills and fever.  Eyes: Negative for pain and visual disturbance.  Respiratory: Positive for shortness of breath. Negative for cough.   Cardiovascular: Positive for chest pain. Negative for palpitations.  Gastrointestinal: Positive for nausea. Negative for abdominal pain and vomiting.  Genitourinary: Negative for dysuria and hematuria.  Musculoskeletal: Negative for arthralgias and back pain.  Skin: Negative for color change and rash.  Neurological: Negative for seizures and syncope.  All other systems reviewed and are negative.   Physical Exam Updated Vital Signs BP 104/74 (BP Location: Right Arm)  Pulse 66   Temp (!) 97.4 F (36.3 C) (Oral)   Resp 15   SpO2 99%   Physical Exam Constitutional:      General: She is not in acute distress. HENT:     Head: Normocephalic and atraumatic.  Eyes:     Conjunctiva/sclera: Conjunctivae normal.     Pupils: Pupils are equal, round, and reactive to light.  Cardiovascular:     Rate and Rhythm: Normal rate and regular rhythm.  Pulmonary:     Effort: Pulmonary effort is normal. No respiratory distress.  Abdominal:     General: There is no distension.     Tenderness: There is no abdominal tenderness.  Skin:    General: Skin is warm and dry.  Neurological:      General: No focal deficit present.     Mental Status: She is alert. Mental status is at baseline.  Psychiatric:        Mood and Affect: Mood normal.        Behavior: Behavior normal.     ED Results / Procedures / Treatments   Labs (all labs ordered are listed, but only abnormal results are displayed) Labs Reviewed  HIV ANTIBODY (ROUTINE TESTING W REFLEX)  BASIC METABOLIC PANEL  LIPID PANEL  CBC  PROTIME-INR    EKG None  Radiology No results found.   Medications Ordered in ED Medications  aspirin EC tablet 81 mg (has no administration in time range)  nitroGLYCERIN (NITROSTAT) SL tablet 0.4 mg (has no administration in time range)  acetaminophen (TYLENOL) tablet 650 mg (has no administration in time range)  ondansetron (ZOFRAN) injection 4 mg (has no administration in time range)  atorvastatin (LIPITOR) tablet 80 mg (has no administration in time range)  lidocaine (PF) (XYLOCAINE) 1 % injection (15 mLs  Given 01/30/21 1905)  ondansetron (ZOFRAN) injection (4 mg Intravenous Given 01/30/21 1906)  fentaNYL (SUBLIMAZE) injection (25 mcg Intravenous Given 01/30/21 1907)  midazolam (VERSED) injection (1 mg Intravenous Given 01/30/21 1907)  heparin sodium (porcine) injection (2,000 Units Intravenous Given 01/30/21 1915)    ED Course  I have reviewed the triage vital signs and the nursing notes.  Pertinent labs & imaging results that were available during my care of the patient were reviewed by me and considered in my medical decision making (see chart for details).  65 year old female presented to ED as a code STEMI.  EKG on arrival shows ST elevations anterior leads consistent with myocardial infarct.  Heparin ordered by cardiology attending and staff, who are present at bedside, will be taken the patient to the Cath Lab.  Blood pressure stable.  1 L of IV fluids ordered.     Final Clinical Impression(s) / ED Diagnoses Final diagnoses:  ST elevation myocardial infarction  (STEMI), unspecified artery [facility])    Rx / DC Orders ED Discharge Orders    None       Tynlee Bayle, Carola Rhine, MD 01/30/21 Lurena Nida

## 2021-01-31 ENCOUNTER — Inpatient Hospital Stay (HOSPITAL_COMMUNITY): Payer: Medicaid Other

## 2021-01-31 ENCOUNTER — Other Ambulatory Visit: Payer: Self-pay

## 2021-01-31 DIAGNOSIS — J431 Panlobular emphysema: Secondary | ICD-10-CM | POA: Diagnosis not present

## 2021-01-31 DIAGNOSIS — I2102 ST elevation (STEMI) myocardial infarction involving left anterior descending coronary artery: Secondary | ICD-10-CM

## 2021-01-31 HISTORY — PX: TRANSTHORACIC ECHOCARDIOGRAM: SHX275

## 2021-01-31 LAB — TROPONIN I (HIGH SENSITIVITY)
Troponin I (High Sensitivity): >27000 ng/L (ref <18)
Troponin I (High Sensitivity): >27000 ng/L (ref <18)

## 2021-01-31 LAB — CBC
HCT: 34.5 % — ABNORMAL LOW (ref 36.0–46.0)
HCT: 34.4 % — ABNORMAL LOW (ref 36.0–46.0)
Hemoglobin: 11.8 g/dL — ABNORMAL LOW (ref 12.0–15.0)
Hemoglobin: 11.8 g/dL — ABNORMAL LOW (ref 12.0–15.0)
MCH: 31.6 pg (ref 26.0–34.0)
MCH: 31.9 pg (ref 26.0–34.0)
MCHC: 34.2 g/dL (ref 30.0–36.0)
MCHC: 34.3 g/dL (ref 30.0–36.0)
MCV: 92.2 fL (ref 80.0–100.0)
MCV: 93 fL (ref 80.0–100.0)
Platelets: 339 10*3/uL (ref 150–400)
Platelets: 323 10*3/uL (ref 150–400)
RBC: 3.74 MIL/uL — ABNORMAL LOW (ref 3.87–5.11)
RBC: 3.7 MIL/uL — ABNORMAL LOW (ref 3.87–5.11)
RDW: 12.4 % (ref 11.5–15.5)
RDW: 12.4 % (ref 11.5–15.5)
WBC: 10.5 10*3/uL (ref 4.0–10.5)
WBC: 10.9 10*3/uL — ABNORMAL HIGH (ref 4.0–10.5)
nRBC: 0 % (ref 0.0–0.2)
nRBC: 0 % (ref 0.0–0.2)

## 2021-01-31 LAB — BASIC METABOLIC PANEL
Anion gap: 7 (ref 5–15)
Anion gap: 9 (ref 5–15)
BUN: 5 mg/dL — ABNORMAL LOW (ref 8–23)
BUN: 5 mg/dL — ABNORMAL LOW (ref 8–23)
CO2: 23 mmol/L (ref 22–32)
CO2: 23 mmol/L (ref 22–32)
Calcium: 8.4 mg/dL — ABNORMAL LOW (ref 8.9–10.3)
Calcium: 8.7 mg/dL — ABNORMAL LOW (ref 8.9–10.3)
Chloride: 103 mmol/L (ref 98–111)
Chloride: 101 mmol/L (ref 98–111)
Creatinine, Ser: 0.63 mg/dL (ref 0.44–1.00)
Creatinine, Ser: 0.67 mg/dL (ref 0.44–1.00)
GFR, Estimated: >60 mL/min (ref >60)
GFR, Estimated: >60 mL/min (ref >60)
Glucose, Bld: 151 mg/dL — ABNORMAL HIGH (ref 70–99)
Glucose, Bld: 149 mg/dL — ABNORMAL HIGH (ref 70–99)
Potassium: 3.9 mmol/L (ref 3.5–5.1)
Potassium: 3.5 mmol/L (ref 3.5–5.1)
Sodium: 133 mmol/L — ABNORMAL LOW (ref 135–145)
Sodium: 133 mmol/L — ABNORMAL LOW (ref 135–145)

## 2021-01-31 LAB — HEMOGLOBIN A1C
Hgb A1c MFr Bld: 5.4 % (ref 4.8–5.6)
Mean Plasma Glucose: 108.28 mg/dL

## 2021-01-31 LAB — MAGNESIUM: Magnesium: 1.7 mg/dL (ref 1.7–2.4)

## 2021-01-31 LAB — ECHOCARDIOGRAM COMPLETE
AR max vel: 1.65 cm2
AV Area VTI: 1.59 cm2
AV Area mean vel: 1.6 cm2
AV Mean grad: 2 mmHg
AV Peak grad: 3.3 mmHg
Ao pk vel: 0.91 m/s
Area-P 1/2: 3.77 cm2
MV VTI: 1.81 cm2
S' Lateral: 2.3 cm
Weight: 1601.42 oz

## 2021-01-31 LAB — LIPID PANEL
Cholesterol: 209 mg/dL — ABNORMAL HIGH (ref 0–200)
HDL: 59 mg/dL (ref >40)
LDL Cholesterol: 139 mg/dL — ABNORMAL HIGH (ref 0–99)
Total CHOL/HDL Ratio: 3.5 RATIO
Triglycerides: 57 mg/dL (ref <150)
VLDL: 11 mg/dL (ref 0–40)

## 2021-01-31 LAB — PROTIME-INR
INR: 1 (ref 0.8–1.2)
Prothrombin Time: 12.9 seconds (ref 11.4–15.2)

## 2021-01-31 LAB — MRSA PCR SCREENING: MRSA by PCR: NEGATIVE

## 2021-01-31 MED ORDER — HEPARIN SODIUM (PORCINE) 5000 UNIT/ML IJ SOLN
5000.0000 [IU] | Freq: Three times a day (TID) | INTRAMUSCULAR | Status: DC
  Administered 2021-01-31 – 2021-02-03 (×11): 5000 [IU] via SUBCUTANEOUS
  Filled 2021-01-31 (×10): qty 1

## 2021-01-31 MED ORDER — POTASSIUM CHLORIDE CRYS ER 20 MEQ PO TBCR
20.0000 meq | EXTENDED_RELEASE_TABLET | Freq: Two times a day (BID) | ORAL | Status: AC
  Administered 2021-01-31 (×2): 20 meq via ORAL
  Filled 2021-01-31 (×2): qty 1

## 2021-01-31 MED ORDER — MAGNESIUM OXIDE -MG SUPPLEMENT 400 (240 MG) MG PO TABS
400.0000 mg | ORAL_TABLET | Freq: Every day | ORAL | Status: DC
  Administered 2021-01-31 – 2021-02-04 (×5): 400 mg via ORAL
  Filled 2021-01-31 (×5): qty 1

## 2021-01-31 MED ORDER — LOSARTAN POTASSIUM 25 MG PO TABS
25.0000 mg | ORAL_TABLET | Freq: Every day | ORAL | Status: DC
  Administered 2021-01-31 – 2021-02-01 (×2): 25 mg via ORAL
  Filled 2021-01-31 (×2): qty 1

## 2021-01-31 MED ORDER — ATROPINE SULFATE 1 MG/10ML IJ SOSY
PREFILLED_SYRINGE | INTRAMUSCULAR | Status: AC
  Filled 2021-01-31: qty 10

## 2021-01-31 MED FILL — Heparin Sod (Porcine)-NaCl IV Soln 1000 Unit/500ML-0.9%: INTRAVENOUS | Qty: 1000 | Status: AC

## 2021-01-31 MED FILL — Verapamil HCl IV Soln 2.5 MG/ML: INTRAVENOUS | Qty: 2 | Status: AC

## 2021-01-31 MED FILL — Dopamine in Dextrose 5% Inj 3.2 MG/ML: INTRAVENOUS | Qty: 250 | Status: AC

## 2021-01-31 NOTE — Progress Notes (Addendum)
Right groin site with mild bruising, soft,no hematoma,  palpable +2 Right pedal pulse.  Patient denies low back pain and abd pain.  VSS see epic. Patient instructed to lay flat for next six hours.

## 2021-01-31 NOTE — Progress Notes (Signed)
Progress Note  Patient Name: Colleen Fleming Date of Encounter: 01/31/2021  [facility] Cardiologist: Glenetta Hew, MD   Subjective   Denies angina or dyspnea. Minimal groin discomfort.  Inpatient Medications    Scheduled Meds: . aspirin EC  81 mg Oral Daily  . atorvastatin  80 mg Oral Daily  . Chlorhexidine Gluconate Cloth  6 each Topical Daily  . heparin  5,000 Units Subcutaneous Q8H  . metoprolol tartrate  12.5 mg Oral BID  . sodium chloride flush  10-40 mL Intracatheter Q12H  . sodium chloride flush  3 mL Intravenous Q12H  . ticagrelor  90 mg Oral BID   Continuous Infusions: . sodium chloride     PRN Meds: sodium chloride, acetaminophen, morphine injection, nitroGLYCERIN, ondansetron (ZOFRAN) IV, sodium chloride flush, sodium chloride flush   Vital Signs    Vitals:   01/31/21 0500 01/31/21 0600 01/31/21 0732 01/31/21 0800  BP: (!) 142/100 (!) 139/94  (!) 135/96  Pulse: 77 73  84  Resp: (!) 25 (!) 27  (!) 30  Temp:   98.2 F (36.8 C)   TempSrc:   Oral   SpO2: 98% 95%  96%  Weight: 45.4 kg       Intake/Output Summary (Last 24 hours) at 01/31/2021 0825 Last data filed at 01/31/2021 0600 Gross per 24 hour  Intake 675.22 ml  Output 550 ml  Net 125.22 ml   Last 3 Weights 01/31/2021 01/30/2021 11/25/2011  Weight (lbs) 100 lb 1.4 oz 97 lb 3.6 oz 124 lb 12.8 oz  Weight (kg) 45.4 kg 44.1 kg 56.609 kg      Telemetry    NSR, occ PVCs. Last eopisode of brief NSVT around midnight - Personally Reviewed  ECG    NSR, QS V1-V2, improving ST elevation V2-V4, evolving T waves - Personally Reviewed  Physical Exam  Lying fully horizontally in bed GEN: No acute distress.   Neck: No JVD Cardiac: RRR, no murmurs, rubs, or gallops.  Respiratory: Clear to auscultation bilaterally. GI: Soft, nontender, non-distended  MS: No edema; No deformity. There is a soft and flat ecchymosis, approx 8-9 cm diameter at R femoral access site. No palpable hematoma or audible bruit.  Mildly tender. Neuro:  Nonfocal  Psych: Normal affect   Labs    High Sensitivity Troponin:   Recent Labs  Lab 01/30/21 2115 01/31/21 0122  TROPONINIHS >27,000* >27,000*      Chemistry Recent Labs  Lab 01/30/21 1924 01/30/21 2115 01/31/21 0122 01/31/21 0448  NA 125*  --  133* 133*  K 2.1*  --  3.9 3.5  CL 88*  --  103 101  CO2  --   --  23 23  GLUCOSE 127*  --  151* 149*  BUN <3*  --  5* 5*  CREATININE 0.40* 0.73 0.63 0.67  CALCIUM  --   --  8.4* 8.7*  GFRNONAA  --  >60 >60 >60  ANIONGAP  --   --  7 9     Hematology Recent Labs  Lab 01/30/21 2115 01/31/21 0122 01/31/21 0448  WBC 14.1* 10.5 10.9*  RBC 3.96 3.74* 3.70*  HGB 12.6 11.8* 11.8*  HCT 35.5* 34.5* 34.4*  MCV 89.6 92.2 93.0  MCH 31.8 31.6 31.9  MCHC 35.5 34.2 34.3  RDW 12.1 12.4 12.4  PLT 340 339 323    BNPNo results for input(s): BNP, PROBNP in the last 168 hours.   DDimer No results for input(s): DDIMER in the last 168 hours.  Radiology    CARDIAC CATHETERIZATION  Result Date: 01/30/2021  The left ventricular ejection fraction is 45-50% by visual estimate. Mid to apical anterior hypokinesis.  LV end diastolic pressure is mildly elevated at 13 mmHg, with systolic 98 mmHg.  There is trivial (1+) mitral regurgitation.  --------------------------------------------  Culprit lesion: Prox LAD lesion is 99% stenosed. (Calcified, thrombotic)  A drug-eluting stent was successfully placed using a SYNERGY XD 2.50X16 -> postdilated 2.8 mm  Post intervention, there is a 0% residual stenosis.  Dist LAD lesion is 80% stenosed -> there is TIMI 0 flow to the apex, post PCI TIMI II flow, but still notable irregularities distally, likely distal embolism.  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  Distal LAD has TIMI I 0 flow.  Successful DES PCI of the LAD using a synergy DES 2.5 mm x 16 mm postdilated to 2.8 mm.  Otherwise  angiographically normal coronary arteries with exception of the apical LAD.  Mild mid to apical anterior hypokinesis on LV gram with mildly reduced EF of roughly 45%.  Upper limit of normal LVEDP.  Borderline hypotension with systolic pressures ranging in the 95 to 105 mmHg range -> no evidence of shock  Severe hypOkalemia, i-STAT potassium read is 2.1, labs from ER read is 2.7.  Resolution of ventricular bigeminy. RECOMMENDATION  Admit to CVICU  Will run 3 rounds of IV potassium and recheck in 3 hours.  Continue IV Aggrastat for another 1 hour post PCI.  Check 2D echocardiogram, A1c and lipid panel the morning.  High-dose high intensity statin  Will attempt to start low-dose beta-blocker given tachycardia, however with borderline pressures, may need to hold.  Cardiac rehab consult  Smoking cessation consult Glenetta Hew, MD   Cardiac Studies     The left ventricular ejection fraction is 45-50% by visual estimate. Mid to apical anterior hypokinesis.  LV end diastolic pressure is mildly elevated at 13 mmHg, with systolic 98 mmHg.  There is trivial (1+) mitral regurgitation.  --------------------------------------------  Culprit lesion: Prox LAD lesion is 99% stenosed. (Calcified, thrombotic)  A drug-eluting stent was successfully placed using a SYNERGY XD 2.50X16 -> postdilated 2.8 mm  Post intervention, there is a 0% residual stenosis.  Dist LAD lesion is 80% stenosed -> there is TIMI 0 flow to the apex, post PCI TIMI II flow, but still notable irregularities distally, likely distal embolism.  Otherwise normal coronaries   SUMMARY  Single-vessel CAD with ostial and proximal LAD subtotal 99% thrombotic stenosis.  Distal LAD has TIMI I 0 flow. ? Successful DES PCI of the LAD using a synergy DES 2.5 mm x 16 mm postdilated to 2.8 mm. ? Otherwise angiographically normal coronary arteries with exception of the apical LAD.  Mild mid to apical anterior hypokinesis on LV gram with  mildly reduced EF of roughly 45%.  Upper limit of normal LVEDP.  Borderline hypotension with systolic pressures ranging in the 95 to 105 mmHg range -> no evidence of shock  Severe hypOkalemia, i-STAT potassium read is 2.1, labs from ER read is 2.7.  Resolution of ventricular bigeminy.   RECOMMENDATION  Admit to CVICU  Will run 3 rounds of IV potassium and recheck in 3 hours.  Continue IV Aggrastat for another 1 hour post PCI.  Check 2D echocardiogram, A1c and lipid panel the morning.  High-dose high intensity statin  Will attempt to start low-dose beta-blocker given tachycardia, however with borderline pressures, may need to  hold.  Cardiac rehab consult  Smoking cessation consult  Diagnostic Dominance: Right    Intervention     Implants    Permanent Stent   Synergy Xd 2.50x16 - [id number] - Implanted  Inventory item: SYNERGY XD 2.50X16      Patient Profile     64 y.o. female with history of lung cancer status post chemo and radiation on the left side, COPD, heavy tobacco abuse with a greater than 50-pack-year history and now currently smoking 2 packs/day who presents with anterior STEMI and severe hypokalemia, now s/p PCI-DES proximal LAD.  Assessment & Plan    1. CAD s/p ant STEMI s/p LAD-DES: no further angina. No clinical CHF so far. Echo pending, expect EF moderately depressed. Mandatory DAPT x 12 months. Hi-dose statin. Beta blockers. Add ARB. 2. Hypokalemia: unexplained. Denies vomiting, diarrhea, diuretic or laxative use. K and Mg daily supplement. If recurs, consider w/u for renal artery stenosis or primary hyperaldosteronism, although BP not particularly high. 3. R groin hematoma: just a flat soft ecchymosis and stable Hgb today. 4. Hx lung Ca s/p chemo/XRT: last documented f/u was in 2018.   For questions or updates, please contact [place] Please consult [email or web address] for contact info under        Signed, Sanda Klein, MD   01/31/2021, 8:25 AM

## 2021-01-31 NOTE — Progress Notes (Signed)
Called Lab Requesting the result for the Mag add ons they still have yet to result.  They state they will add them to the blood in the lab.

## 2021-01-31 NOTE — Progress Notes (Addendum)
0126 Femstop removed from Right groin.  Right groin soft, no hematoma, mild bruising, doppler pedal pulses, cap refill ,3 seconds.  Patient denies low back or abdominal pain.  Stat labs collected and sent per MD's order.

## 2021-01-31 NOTE — Progress Notes (Signed)
Called Lab Magnesium level has not resulted.  Request that Magnesium level to be added to blood in lab.

## 2021-01-31 NOTE — Progress Notes (Signed)
Notified Cardiac Fellow Dr. Marcelle Smiling Critical Troponin >27,000 same as before Next troponin at 0730.  Potassium is now 3.9 requesting if I can have a mag level.  Patient continues to have frequent PVCs.

## 2021-01-31 NOTE — Progress Notes (Signed)
Called Lab and request that Mag level be added to blood send down to lab.

## 2021-01-31 NOTE — Progress Notes (Signed)
Sheath pulled at 0051 by this RN. Distal pulses palpable and site soft prior to pull. As RN was holding pressure, hematoma began to develop. Patient voided prior to sheath, patient bladder scanned during process and showed ~230 cc. Cardiology fellow called and ordered for femstop to be placed and held for 20 minutes - placed at [facility].  Distal pulses dopplered post fem-stop placement.    Vital signs remained stable throughout entire process.   0115: Patient having more ectopy, verbal orders for CBC, BMP.

## 2021-01-31 NOTE — Plan of Care (Signed)
Problem: Education: Goal: Knowledge of General Education information will improve Description: Including pain rating scale, medication(s)/side effects and non-pharmacologic comfort measures 01/31/2021 0338 by Teena Irani, RN Outcome: Progressing 01/31/2021 0337 by Teena Irani, RN Outcome: Progressing   Problem: Health Behavior/Discharge Planning: Goal: Ability to manage health-related needs will improve 01/31/2021 0338 by Teena Irani, RN Outcome: Progressing 01/31/2021 0337 by Teena Irani, RN Outcome: Progressing   Problem: Clinical Measurements: Goal: Ability to maintain clinical measurements within normal limits will improve 01/31/2021 0338 by Teena Irani, RN Outcome: Progressing 01/31/2021 0337 by Teena Irani, RN Outcome: Progressing Goal: Will remain free from infection 01/31/2021 0338 by Teena Irani, RN Outcome: Progressing 01/31/2021 0337 by Teena Irani, RN Outcome: Progressing Goal: Diagnostic test results will improve 01/31/2021 0338 by Teena Irani, RN Outcome: Progressing 01/31/2021 0337 by Teena Irani, RN Outcome: Progressing Goal: Respiratory complications will improve 01/31/2021 0338 by Teena Irani, RN Outcome: Progressing 01/31/2021 0337 by Teena Irani, RN Outcome: Progressing Goal: Cardiovascular complication will be avoided 01/31/2021 0338 by Teena Irani, RN Outcome: Progressing 01/31/2021 0337 by Teena Irani, RN Outcome: Progressing   Problem: Activity: Goal: Risk for activity intolerance will decrease 01/31/2021 0338 by Teena Irani, RN Outcome: Progressing 01/31/2021 0337 by Teena Irani, RN Outcome: Progressing   Problem: Nutrition: Goal: Adequate nutrition will be maintained 01/31/2021 0338 by Teena Irani, RN Outcome: Progressing 01/31/2021 0337 by Teena Irani, RN Outcome: Progressing   Problem: Nutrition: Goal:  Adequate nutrition will be maintained 01/31/2021 0338 by Teena Irani, RN Outcome: Progressing 01/31/2021 0337 by Teena Irani, RN Outcome: Progressing   Problem: Coping: Goal: Level of anxiety will decrease 01/31/2021 0338 by Teena Irani, RN Outcome: Progressing 01/31/2021 0337 by Teena Irani, RN Outcome: Progressing   Problem: Elimination: Goal: Will not experience complications related to bowel motility 01/31/2021 0338 by Teena Irani, RN Outcome: Progressing 01/31/2021 0337 by Teena Irani, RN Outcome: Progressing Goal: Will not experience complications related to urinary retention 01/31/2021 0338 by Teena Irani, RN Outcome: Progressing 01/31/2021 0337 by Teena Irani, RN Outcome: Progressing   Problem: Elimination: Goal: Will not experience complications related to bowel motility 01/31/2021 0338 by Teena Irani, RN Outcome: Progressing 01/31/2021 0337 by Teena Irani, RN Outcome: Progressing Goal: Will not experience complications related to urinary retention 01/31/2021 0338 by Teena Irani, RN Outcome: Progressing 01/31/2021 0337 by Teena Irani, RN Outcome: Progressing   Problem: Pain Managment: Goal: General experience of comfort will improve 01/31/2021 0338 by Teena Irani, RN Outcome: Progressing 01/31/2021 0337 by Teena Irani, RN Outcome: Progressing   Problem: Safety: Goal: Ability to remain free from injury will improve 01/31/2021 0338 by Teena Irani, RN Outcome: Progressing 01/31/2021 0337 by Teena Irani, RN Outcome: Progressing   Problem: Skin Integrity: Goal: Risk for impaired skin integrity will decrease 01/31/2021 0338 by Teena Irani, RN Outcome: Progressing 01/31/2021 0337 by Teena Irani, RN Outcome: Progressing   Problem: Education: Goal: Understanding of CV disease, CV risk reduction, and recovery process will  improve 01/31/2021 0338 by Teena Irani, RN Outcome: Progressing 01/31/2021 0337 by Teena Irani, RN Outcome: Progressing Goal: Individualized Educational Video(s) 01/31/2021 0338 by Teena Irani, RN Outcome: Progressing 01/31/2021 0337 by Teena Irani, RN Outcome: Progressing   Problem: Activity: Goal: Ability to return to baseline activity level will improve 01/31/2021 0338 by Teena Irani,  RN Outcome: Progressing 01/31/2021 0337 by Teena Irani, RN Outcome: Progressing   Problem: Cardiovascular: Goal: Ability to achieve and maintain adequate cardiovascular perfusion will improve 01/31/2021 0338 by Teena Irani, RN Outcome: Progressing 01/31/2021 0337 by Teena Irani, RN Outcome: Progressing Goal: Vascular access site(s) Level 0-1 will be maintained 01/31/2021 0338 by Teena Irani, RN Outcome: Progressing 01/31/2021 0337 by Teena Irani, RN Outcome: Progressing   Problem: Health Behavior/Discharge Planning: Goal: Ability to safely manage health-related needs after discharge will improve 01/31/2021 0338 by Teena Irani, RN Outcome: Progressing 01/31/2021 0337 by Teena Irani, RN Outcome: Progressing   Problem: Education: Goal: Understanding of cardiac disease, CV risk reduction, and recovery process will improve 01/31/2021 0338 by Teena Irani, RN Outcome: Progressing 01/31/2021 0337 by Teena Irani, RN Outcome: Progressing Goal: Understanding of medication regimen will improve 01/31/2021 0338 by Teena Irani, RN Outcome: Progressing 01/31/2021 0337 by Teena Irani, RN Outcome: Progressing Goal: Individualized Educational Video(s) 01/31/2021 0338 by Teena Irani, RN Outcome: Progressing 01/31/2021 0337 by Teena Irani, RN Outcome: Progressing   Problem: Activity: Goal: Ability to tolerate increased activity will improve 01/31/2021 0338 by Teena Irani, RN Outcome: Progressing 01/31/2021 0337 by Teena Irani, RN Outcome: Progressing   Problem: Cardiac: Goal: Ability to achieve and maintain adequate cardiopulmonary perfusion will improve 01/31/2021 0338 by Teena Irani, RN Outcome: Progressing 01/31/2021 0337 by Teena Irani, RN Outcome: Progressing Goal: Vascular access site(s) Level 0-1 will be maintained 01/31/2021 0338 by Teena Irani, RN Outcome: Progressing 01/31/2021 0337 by Teena Irani, RN Outcome: Progressing   Problem: Health Behavior/Discharge Planning: Goal: Ability to safely manage health-related needs after discharge will improve 01/31/2021 0338 by Teena Irani, RN Outcome: Progressing 01/31/2021 0337 by Teena Irani, RN Outcome: Progressing

## 2021-01-31 NOTE — Progress Notes (Signed)
  Echocardiogram 2D Echocardiogram has been performed.  Colleen Fleming 01/31/2021, 11:44 AM

## 2021-02-01 ENCOUNTER — Encounter (HOSPITAL_COMMUNITY): Payer: Self-pay | Admitting: Cardiology

## 2021-02-01 ENCOUNTER — Other Ambulatory Visit (HOSPITAL_COMMUNITY): Payer: Self-pay

## 2021-02-01 ENCOUNTER — Inpatient Hospital Stay (HOSPITAL_COMMUNITY): Payer: Medicaid Other

## 2021-02-01 DIAGNOSIS — I241 Dressler's syndrome: Secondary | ICD-10-CM | POA: Insufficient documentation

## 2021-02-01 DIAGNOSIS — I950 Idiopathic hypotension: Secondary | ICD-10-CM

## 2021-02-01 DIAGNOSIS — I313 Pericardial effusion (noninflammatory): Secondary | ICD-10-CM | POA: Diagnosis not present

## 2021-02-01 HISTORY — PX: TRANSTHORACIC ECHOCARDIOGRAM: SHX275

## 2021-02-01 LAB — BASIC METABOLIC PANEL
Anion gap: 9 (ref 5–15)
Anion gap: 9 (ref 5–15)
BUN: <5 mg/dL — ABNORMAL LOW (ref 8–23)
BUN: <5 mg/dL — ABNORMAL LOW (ref 8–23)
CO2: 25 mmol/L (ref 22–32)
CO2: 21 mmol/L — ABNORMAL LOW (ref 22–32)
Calcium: 8.7 mg/dL — ABNORMAL LOW (ref 8.9–10.3)
Calcium: 8.3 mg/dL — ABNORMAL LOW (ref 8.9–10.3)
Chloride: 101 mmol/L (ref 98–111)
Chloride: 103 mmol/L (ref 98–111)
Creatinine, Ser: 0.66 mg/dL (ref 0.44–1.00)
Creatinine, Ser: 0.77 mg/dL (ref 0.44–1.00)
GFR, Estimated: >60 mL/min (ref >60)
GFR, Estimated: >60 mL/min (ref >60)
Glucose, Bld: 114 mg/dL — ABNORMAL HIGH (ref 70–99)
Glucose, Bld: 110 mg/dL — ABNORMAL HIGH (ref 70–99)
Potassium: 3.7 mmol/L (ref 3.5–5.1)
Potassium: 3.2 mmol/L — ABNORMAL LOW (ref 3.5–5.1)
Sodium: 135 mmol/L (ref 135–145)
Sodium: 133 mmol/L — ABNORMAL LOW (ref 135–145)

## 2021-02-01 LAB — COOXEMETRY PANEL
Carboxyhemoglobin: 0.8 % (ref 0.5–1.5)
Carboxyhemoglobin: 0.8 % (ref 0.5–1.5)
Methemoglobin: 1.1 % (ref 0.0–1.5)
Methemoglobin: 0.7 % (ref 0.0–1.5)
O2 Saturation: 67.5 %
O2 Saturation: 67.8 %
Total hemoglobin: 11.4 g/dL — ABNORMAL LOW (ref 12.0–16.0)
Total hemoglobin: 10.9 g/dL — ABNORMAL LOW (ref 12.0–16.0)

## 2021-02-01 LAB — ECHOCARDIOGRAM LIMITED
Height: 57 in
Weight: 1552.04 oz

## 2021-02-01 LAB — CBC
HCT: 34.1 % — ABNORMAL LOW (ref 36.0–46.0)
Hemoglobin: 11.6 g/dL — ABNORMAL LOW (ref 12.0–15.0)
MCH: 31.6 pg (ref 26.0–34.0)
MCHC: 34 g/dL (ref 30.0–36.0)
MCV: 92.9 fL (ref 80.0–100.0)
Platelets: 273 10*3/uL (ref 150–400)
RBC: 3.67 MIL/uL — ABNORMAL LOW (ref 3.87–5.11)
RDW: 12.5 % (ref 11.5–15.5)
WBC: 8.4 10*3/uL (ref 4.0–10.5)
nRBC: 0 % (ref 0.0–0.2)

## 2021-02-01 LAB — POTASSIUM: Potassium: 3.5 mmol/L (ref 3.5–5.1)

## 2021-02-01 LAB — BRAIN NATRIURETIC PEPTIDE: B Natriuretic Peptide: 792.9 pg/mL — ABNORMAL HIGH (ref 0.0–100.0)

## 2021-02-01 LAB — POCT ACTIVATED CLOTTING TIME: Activated Clotting Time: 142 seconds

## 2021-02-01 MED ORDER — POTASSIUM CHLORIDE CRYS ER 20 MEQ PO TBCR
20.0000 meq | EXTENDED_RELEASE_TABLET | Freq: Once | ORAL | Status: AC
  Administered 2021-02-01: 20 meq via ORAL
  Filled 2021-02-01: qty 1

## 2021-02-01 MED ORDER — POTASSIUM CHLORIDE CRYS ER 20 MEQ PO TBCR
40.0000 meq | EXTENDED_RELEASE_TABLET | Freq: Once | ORAL | Status: AC
  Administered 2021-02-01: 40 meq via ORAL
  Filled 2021-02-01: qty 2

## 2021-02-01 MED ORDER — DAPAGLIFLOZIN PROPANEDIOL 5 MG PO TABS
5.0000 mg | ORAL_TABLET | Freq: Every day | ORAL | Status: DC
  Administered 2021-02-01: 5 mg via ORAL
  Filled 2021-02-01: qty 1

## 2021-02-01 MED ORDER — NOREPINEPHRINE 4 MG/250ML-% IV SOLN
0.0000 ug/min | INTRAVENOUS | Status: DC
  Administered 2021-02-01: 1 ug/min via INTRAVENOUS
  Filled 2021-02-01: qty 250

## 2021-02-01 MED ORDER — SODIUM CHLORIDE 0.9 % IV BOLUS
250.0000 mL | Freq: Once | INTRAVENOUS | Status: AC
  Administered 2021-02-01: 250 mL via INTRAVENOUS

## 2021-02-01 MED ORDER — SODIUM CHLORIDE 0.9 % IV SOLN: INTRAVENOUS | Status: AC

## 2021-02-01 MED ORDER — CARVEDILOL 3.125 MG PO TABS
3.1250 mg | ORAL_TABLET | Freq: Two times a day (BID) | ORAL | Status: DC
  Administered 2021-02-01: 3.125 mg via ORAL
  Filled 2021-02-01: qty 1

## 2021-02-01 MED ORDER — POTASSIUM CHLORIDE 10 MEQ/50ML IV SOLN
10.0000 meq | Freq: Once | INTRAVENOUS | Status: AC
  Administered 2021-02-01: 10 meq via INTRAVENOUS
  Filled 2021-02-01: qty 50

## 2021-02-01 NOTE — Care Management (Signed)
02-01-21 1720 Patient has Medicaid and the cost for Brilinta should be no more than $3.00. Graves-Bigelow, Ocie Cornfield, RN, BSN Case Manager

## 2021-02-01 NOTE — Progress Notes (Addendum)
Progress Note  Patient Name: Colleen Fleming Date of Encounter: 02/01/2021  [facility] Cardiologist: Glenetta Hew, MD   Subjective   She does not recognize a history of COPD and prior lung cancer.  States that she just wants to go home.  Says that she lives with her sons.  Inpatient Medications    Scheduled Meds: . aspirin EC  81 mg Oral Daily  . atorvastatin  80 mg Oral Daily  . carvedilol  3.125 mg Oral BID WC  . Chlorhexidine Gluconate Cloth  6 each Topical Daily  . dapagliflozin propanediol  5 mg Oral Daily  . heparin  5,000 Units Subcutaneous Q8H  . losartan  25 mg Oral Daily  . magnesium oxide  400 mg Oral Daily  . sodium chloride flush  10-40 mL Intracatheter Q12H  . sodium chloride flush  3 mL Intravenous Q12H  . ticagrelor  90 mg Oral BID   Continuous Infusions: . sodium chloride     PRN Meds: sodium chloride, acetaminophen, morphine injection, nitroGLYCERIN, ondansetron (ZOFRAN) IV, sodium chloride flush, sodium chloride flush   Vital Signs    Vitals:   02/01/21 0800 02/01/21 0900 02/01/21 0921 02/01/21 1000  BP: 106/81 105/75  95/74  Pulse: 85 81 87 91  Resp: (!) 24 (!) 29  20  Temp:      TempSrc:      SpO2: 96% 99%  97%  Weight:      Height:        Intake/Output Summary (Last 24 hours) at 02/01/2021 1006 Last data filed at 02/01/2021 0800 Gross per 24 hour  Intake 170 ml  Output 1500 ml  Net -1330 ml   Last 3 Weights 02/01/2021 01/31/2021 01/30/2021  Weight (lbs) 97 lb 100 lb 1.4 oz 97 lb 3.6 oz  Weight (kg) 44 kg 45.4 kg 44.1 kg      Telemetry    Normal sinus rhythm.  Occasional PVCs.- Personally Reviewed  ECG    QS pattern V1 and V2, T wave inversion V1 through V3, borderline long QT.  ST elevation V3, minimal.  Marked improvement compared to 01/30/2021- Personally Reviewed  Physical Exam  Frail-appearing older than stated age. GEN: No acute distress.   Neck: No JVD Cardiac: RRR, no murmurs, rubs, or gallops.  Vascular: Significant right  inguinal ecchymosis but no firm hematoma is noted.  The femoral pulses 2+. Respiratory: Clear to auscultation bilaterally. GI: Soft, nontender, non-distended  MS: No edema; No deformity. Neuro:  Nonfocal  Psych: Normal affect   Labs    High Sensitivity Troponin:   Recent Labs  Lab 01/30/21 2115 01/31/21 0122 01/31/21 0730  TROPONINIHS >27,000* >27,000* >27,000*      Chemistry Recent Labs  Lab 01/31/21 0122 01/31/21 0448 02/01/21 0516  NA 133* 133* 135  K 3.9 3.5 3.7  CL 103 101 101  CO2 23 23 25   GLUCOSE 151* 149* 114*  BUN 5* 5* <5*  CREATININE 0.63 0.67 0.66  CALCIUM 8.4* 8.7* 8.7*  GFRNONAA >60 >60 >60  ANIONGAP 7 9 9      Hematology Recent Labs  Lab 01/31/21 0122 01/31/21 0448 02/01/21 0516  WBC 10.5 10.9* 8.4  RBC 3.74* 3.70* 3.67*  HGB 11.8* 11.8* 11.6*  HCT 34.5* 34.4* 34.1*  MCV 92.2 93.0 92.9  MCH 31.6 31.9 31.6  MCHC 34.2 34.3 34.0  RDW 12.4 12.4 12.5  PLT 339 323 273    BNPNo results for input(s): BNP, PROBNP in the last 168 hours.  DDimer No results for input(s): DDIMER in the last 168 hours.   Radiology    DG Chest 2 View  Result Date: 02/01/2021 CLINICAL DATA:  History of lung cancer EXAM: CHEST - 2 VIEW COMPARISON:  Radiograph 11/25/2011 FINDINGS: Right IJ central venous catheter tip terminates at the right atrium. Telemetry leads overlie the chest. Chronically coarsened interstitial changes and bronchitic features. Bandlike opacity is seen in the left lung apex with some tenting of the left hemidiaphragm, could reflect subsegmental atelectasis or volume loss though underlying airspace disease or architectural distortion could have a similar appearance. No focal consolidative opacity is seen. The cardiomediastinal contours are unremarkable. Few coronary stents are noted. Age-indeterminate left sixth and seventh posterolateral left rib fractures. No other acute osseous or soft tissue abnormalities the chest wall. IMPRESSION: Chronically  coarsened interstitial and bronchitic features. New bandlike opacity in left lung apex with tenting of the left hemidiaphragm. Could reflect scarring or atelectatic change with volume loss. Underlying airspace disease or mass is less favored though not fully excluded. Right IJ approach central venous catheter at the right atrium Posterolateral left sixth and seventh rib fractures, age indeterminate, correlate for point tenderness. Electronically Signed   By: Lovena Le M.D.   On: 02/01/2021 04:25   CARDIAC CATHETERIZATION  Result Date: 01/30/2021  The left ventricular ejection fraction is 45-50% by visual estimate. Mid to apical anterior hypokinesis.  LV end diastolic pressure is mildly elevated at 13 mmHg, with systolic 98 mmHg.  There is trivial (1+) mitral regurgitation.  --------------------------------------------  Culprit lesion: Prox LAD lesion is 99% stenosed. (Calcified, thrombotic)  A drug-eluting stent was successfully placed using a SYNERGY XD 2.50X16 -> postdilated 2.8 mm  Post intervention, there is a 0% residual stenosis.  Dist LAD lesion is 80% stenosed -> there is TIMI 0 flow to the apex, post PCI TIMI II flow, but still notable irregularities distally, likely distal embolism.  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  Distal LAD has TIMI I 0 flow.  Successful DES PCI of the LAD using a synergy DES 2.5 mm x 16 mm postdilated to 2.8 mm.  Otherwise angiographically normal coronary arteries with exception of the apical LAD.  Mild mid to apical anterior hypokinesis on LV gram with mildly reduced EF of roughly 45%.  Upper limit of normal LVEDP.  Borderline hypotension with systolic pressures ranging in the 95 to 105 mmHg range -> no evidence of shock  Severe hypOkalemia, i-STAT potassium read is 2.1, labs from ER read is 2.7.  Resolution of ventricular bigeminy. RECOMMENDATION  Admit to CVICU  Will run 3  rounds of IV potassium and recheck in 3 hours.  Continue IV Aggrastat for another 1 hour post PCI.  Check 2D echocardiogram, A1c and lipid panel the morning.  High-dose high intensity statin  Will attempt to start low-dose beta-blocker given tachycardia, however with borderline pressures, may need to hold.  Cardiac rehab consult  Smoking cessation consult Glenetta Hew, MD  ECHOCARDIOGRAM COMPLETE  Result Date: 01/31/2021    ECHOCARDIOGRAM REPORT   Patient Name:   HARVEST DEIST Date of Exam: 01/31/2021 Medical Rec #:  [id number]         Height:       58.0 in Accession #:    [id number]        Weight:       100.1 lb Date of Birth:  10/01/1956  BSA:          1.357 m Patient Age:    27 years          BP:           133/91 mmHg Patient Gender: F                 HR:           73 bpm. Exam Location:  Inpatient Procedure: 2D Echo, Cardiac Doppler, Color Doppler and 3D Echo Indications:    Acute myocardial infarction  History:        Patient has no prior history of Echocardiogram examinations.                 Acute MI and CAD; Risk Factors:Current Smoker. GERD.  Sonographer:    Clayton Lefort RDCS (AE) Referring Phys: [place]  1. No left ventricular thrombus. Left ventricular ejection fraction, by estimation, is 25 to 30%. The left ventricle has severely decreased function. The left ventricle demonstrates regional wall motion abnormalities (see scoring diagram/findings for description). Left ventricular diastolic parameters are consistent with Grade II diastolic dysfunction (pseudonormalization). Elevated left atrial pressure. There is severe hypokinesis of the left ventricular, entire anteroseptal wall and anterior wall. There is mild dyskinesis of the left ventricular, apical anterior wall and apical segment.  2. Right ventricular systolic function is normal. The right ventricular size is normal. There is mildly elevated pulmonary artery systolic pressure.  3. A small pericardial  effusion is present. The pericardial effusion is circumferential.  4. The mitral valve is normal in structure. No evidence of mitral valve regurgitation.  5. The aortic valve is normal in structure. Aortic valve regurgitation is mild.  6. The inferior vena cava is normal in size with <50% respiratory variability, suggesting right atrial pressure of 8 mmHg. FINDINGS  Left Ventricle: No left ventricular thrombus. Left ventricular ejection fraction, by estimation, is 25 to 30%. The left ventricle has severely decreased function. The left ventricle demonstrates regional wall motion abnormalities. Severe hypokinesis of the left ventricular, entire anteroseptal wall and anterior wall. Mild dyskinesis of the left ventricular, apical anterior wall and apical segment. 3D left ventricular ejection fraction analysis performed but not reported based on interpreter judgement due to suboptimal quality. The left ventricular internal cavity size was normal in size. There is no left ventricular hypertrophy. Left ventricular diastolic parameters are consistent with Grade II diastolic dysfunction (pseudonormalization). Elevated left atrial pressure.  LV Wall Scoring: The apical anterior segment and apex are dyskinetic. The anterior wall, entire anterior septum, apical lateral segment, mid inferoseptal segment, and apical inferior segment are hypokinetic. The antero-lateral wall, inferior wall, posterior wall, and basal inferoseptal segment are normal. Right Ventricle: The right ventricular size is normal. No increase in right ventricular wall thickness. Right ventricular systolic function is normal. There is mildly elevated pulmonary artery systolic pressure. The tricuspid regurgitant velocity is 2.63  m/s, and with an assumed right atrial pressure of 8 mmHg, the estimated right ventricular systolic pressure is 95.6 mmHg. Left Atrium: Left atrial size was normal in size. Right Atrium: Right atrial size was normal in size. Pericardium:  A small pericardial effusion is present. The pericardial effusion is circumferential. Mitral Valve: The mitral valve is normal in structure. No evidence of mitral valve regurgitation. MV peak gradient, 2.7 mmHg. The mean mitral valve gradient is 1.0 mmHg. Tricuspid Valve: The tricuspid valve is normal in structure. Tricuspid valve regurgitation is mild. Aortic Valve: The aortic  valve is normal in structure. Aortic valve regurgitation is mild. Aortic valve mean gradient measures 2.0 mmHg. Aortic valve peak gradient measures 3.3 mmHg. Aortic valve area, by VTI measures 1.59 cm. Pulmonic Valve: The pulmonic valve was normal in structure. Pulmonic valve regurgitation is trivial. Aorta: The aortic root is normal in size and structure. Venous: The inferior vena cava is normal in size with less than 50% respiratory variability, suggesting right atrial pressure of 8 mmHg. IAS/Shunts: No atrial level shunt detected by color flow Doppler. Additional Comments: A venous catheter is visualized in the right atrium.  LEFT VENTRICLE PLAX 2D LVIDd:         3.80 cm  Diastology LVIDs:         2.30 cm  LV e' medial:    4.46 cm/s LV PW:         1.10 cm  LV E/e' medial:  11.5 LV IVS:        0.90 cm  LV e' lateral:   5.44 cm/s LVOT diam:     1.70 cm  LV E/e' lateral: 9.4 LV SV:         28 LV SV Index:   21 LVOT Area:     2.27 cm                          3D Volume EF:                         3D EF:        36 %                         LV EDV:       97 ml                         LV ESV:       62 ml                         LV SV:        35 ml RIGHT VENTRICLE            IVC RV Basal diam:  2.50 cm    IVC diam: 1.80 cm RV S prime:     9.79 cm/s TAPSE (M-mode): 1.2 cm LEFT ATRIUM             Index       RIGHT ATRIUM          Index LA diam:        1.90 cm 1.40 cm/m  RA Area:     7.28 cm LA Vol (A2C):   23.0 ml 16.95 ml/m RA Volume:   13.40 ml 9.88 ml/m LA Vol (A4C):   23.2 ml 17.10 ml/m LA Biplane Vol: 24.4 ml 17.98 ml/m  AORTIC VALVE AV  Area (Vmax):    1.65 cm AV Area (Vmean):   1.60 cm AV Area (VTI):     1.59 cm AV Vmax:           91.00 cm/s AV Vmean:          64.500 cm/s AV VTI:            0.176 m AV Peak Grad:      3.3 mmHg AV Mean Grad:      2.0 mmHg LVOT Vmax:  66.20 cm/s LVOT Vmean:        45.600 cm/s LVOT VTI:          0.123 m LVOT/AV VTI ratio: 0.70  AORTA Ao Root diam: 2.90 cm Ao Asc diam:  3.10 cm MITRAL VALVE               TRICUSPID VALVE MV Area (PHT): 3.77 cm    TR Peak grad:   27.7 mmHg MV Area VTI:   1.81 cm    TR Vmax:        263.00 cm/s MV Peak grad:  2.7 mmHg MV Mean grad:  1.0 mmHg    SHUNTS MV Vmax:       0.81 m/s    Systemic VTI:  0.12 m MV Vmean:      49.0 cm/s   Systemic Diam: 1.70 cm MV Decel Time: 201 msec MV E velocity: 51.40 cm/s MV A velocity: 41.60 cm/s MV E/A ratio:  1.24 Mihai Croitoru MD Electronically signed by Sanda Klein MD Signature Date/Time: 01/31/2021/12:09:51 PM    Final     Cardiac Studies   ECHOCARDIOGRAPHY 01/31/2021:  LVEF 25 to 30%.  Small pericardial effusion  Normal right ventricular function   Cardiac cath/PCI 01/30/2021: Diagnostic Dominance: Right    Intervention       Patient Profile     65 y.o. female with history of lung cancer status post chemo and radiation on the left side, COPD, heavy tobacco abuse with a greater than 50-pack-year history and now currently smoking 2 packs/day who presents with anterior STEMI and severe hypokalemia, now s/p PCI-DES proximal LAD.  Assessment & Plan    1. Anterior ST elevation MI: Troponin I serial determination suggests late presentation.  Initial troponin I greater than 27,000 although first determination was after reperfusion.  Begin cardiac rehab later today.  Ambulate as tolerated.  Okay to transfer out of unit if ambulates well. 2. Acute systolic heart failure: Switch to carvedilol 3.125 mg p.o. twice daily but give only if systolic pressures greater than 100 mmHg.  Continue low-dose losartan.  Hope to  transition to [place].  Farxiga 5 mg/day. 3. Hypotension: We will watch closely.  Not attempting diuresis.  No clinical evidence of heart failure on exam. 4. COPD: No specific therapy currently. 5. History of lung cancer: No prognostic information available but will research.  The EF by echo is much lower than anticipated.  Plan to use guideline directed medical therapy for systolic dysfunction as tolerated by blood pressure.  We need to watch for evidence of congestion.  Blood pressures are borderline.  We will attempt to begin some form of ambulation today with cardiac rehab looking in.   For questions or updates, please contact [place] Please consult [email or web address] for contact info under        Signed, Sinclair Grooms, MD  02/01/2021, 10:06 AM

## 2021-02-01 NOTE — Progress Notes (Addendum)
CARDIAC REHAB PHASE I   PRE:  Rate/Rhythm: 96 SR                 Before that normal beat followed by 2 to 3 irregular beats  With clumping appearance  BP:  Supine:   Sitting: 95/74  Standing:    SaO2: 99%RA  MODE:  Ambulation: 170 ft   POST:  Rate/Rhythm: 116 ST PVCs  BP:  Supine:   Sitting: 105/76  Standing:    SaO2: 100%RA 1000-1112 Pt constantly playing with phone. Playing connect the dots. Played with phone during education but could answer teach back questions. Left materials and encouraged her to have sons read also. She stated her sons take care of her. Pt had irregular rhythm prior to walk but became regular and stayed regular during walk. No complaints. Walked 173ft on RA with rolling walker and asst x 1. Pt stated that she has balance issues at home but does not fall. Has cane. Assisted to recliner with call bell. Gave MI and CHF booklets. Discussed MI restrictions, importance of brilinta with stent, NTG use, and smoking cessation. Pt stated she has tried many ways to quit including medication and patches and has not succeeded. Gave handout and encouraged her to call [phone]. Discussed CRP 2 and referred to [place]. Pt stated she was not interested but felt her sons would want her to do.  Will continue ed tomorrow. RN aware of irregular and then regular rhythm.   Graylon Good, RN BSN  02/01/2021 11:00 AM

## 2021-02-01 NOTE — Consult Note (Addendum)
Advanced Heart Failure Team Consult Note   Primary Physician: No primary care provider on file. PCP-Cardiologist:  Glenetta Hew, MD  Reason for Consultation: acute systolic heart failure, post anterior MI  HPI:    Colleen Fleming is seen today for evaluation of acute systolic heart failure, post anterior MI>> ? early developing cardiogenic shock, at the request of Dr. Tamala Julian, Cardiology.   65 y/o WF, long time heavy smoker, w/ COPD and h/o lung cancer treated w/ chemo and radiation, now in remission, admitted on 4/30 for acute anterior STEMI. Emergent LHC showed 99% pLAD occlusion treated w/ PCI + DES, also w/ 80% dLAD stenosis to be treated medically but no other coronary disease. Hs trop >27,000. LDL 139. Initial LVG at time of cath w/ mildly reduced LVEF, estimated ~45-50%. However 2D echo showed severely reduced LVEF, 25-30% w/ severe HK of the left ventricular, entire anteroseptal and anterior wall. No MR. RV normal. Small pericardial effusion.   She was placed on DAPT w/ ASA + Brilinta + high intensity statin + low dose  blocker and SGLT2i (hgb A1c 5.4).   Today, she became hypotensive w/ concerns for early developing CGS. CO2 21, though Co-ox was ok at 68%. She has been started on NE for hypotension. SBPs now improved, up from 93G>>18E systolic Repeat limited echo has been ordered. D/w w/ Dr. Tamala Julian who has reviewed images at bedside. RV is small. Small pericardial effusion also noted. No mechanical complications observed. CVP is low at 2. She was given 300 cc NaCl fluid bolus. Scr normal 0.77.   She is currently resting comfortably w/ sister present at beside. Denies CP. No dyspnea. CXR earlier today showed no overt edema but does show posterolateral left sixth and seventh rib fractures, age Indeterminate. She denies any recent falls and she did not require any CPR on admit. CXR did show new bandlike opacity in left lung apex with tenting of the left hemidiaphragm. Could reflect  scarring or atelectatic change with volume loss. Underlying airspace disease or mass is less favored though not fully excluded.    Emergent LHC 01/30/21  Culprit lesion: Prox LAD lesion is 99% stenosed. (Calcified, thrombotic)  A drug-eluting stent was successfully placed using a SYNERGY XD 2.50X16 -> postdilated 2.8 mm  Post intervention, there is a 0% residual stenosis.  Dist LAD lesion is 80% stenosed -> there is TIMI 0 flow to the apex, post PCI TIMI II flow, but still notable irregularities distally, likely distal embolism.  Otherwise normal coronaries     Echo 01/31/21 1. No left ventricular thrombus. Left ventricular ejection fraction, by estimation, is 25 to 30%. The left ventricle has severely decreased function. The left ventricle demonstrates regional wall motion abnormalities (see scoring diagram/findings for description). Left ventricular diastolic parameters are consistent with Grade II diastolic dysfunction (pseudonormalization). Elevated left atrial pressure. There is severe hypokinesis of the left ventricular, entire anteroseptal wall and anterior wall. There is mild dyskinesis of the left ventricular, apical anterior wall and apical segment. 2. Right ventricular systolic function is normal. The right ventricular size is normal. There is mildly elevated pulmonary artery systolic pressure. 3. A small pericardial effusion is present. The pericardial effusion is circumferential. 4. The mitral valve is normal in structure. No evidence of mitral valve regurgitation. 5. The aortic valve is normal in structure. Aortic valve regurgitation is mild. 6. The inferior vena cava is normal in size with <50% respiratory variability, suggesting right atrial pressure of 8 mmHg.  Review of Systems: [y] = yes, [ ]  = no   . General: Weight gain [ ] ; Weight loss [ ] ; Anorexia [ ] ; Fatigue [ ] ; Fever [ ] ; Chills [ ] ; Weakness [ ]   . Cardiac: Chest pain/pressure [Y on admit, now  resolved ]; Resting SOB [ ] ; Exertional SOB [ ] ; Orthopnea [ ] ; Pedal Edema [ ] ; Palpitations [ ] ; Syncope [ ] ; Presyncope [ ] ; Paroxysmal nocturnal dyspnea[ ]   . Pulmonary: Cough [ ] ; Wheezing[ ] ; Hemoptysis[ ] ; Sputum [ ] ; Snoring [ ]   . GI: Vomiting[ ] ; Dysphagia[ ] ; Melena[ ] ; Hematochezia [ ] ; Heartburn[ ] ; Abdominal pain [ ] ; Constipation [ ] ; Diarrhea [ ] ; BRBPR [ ]   . GU: Hematuria[ ] ; Dysuria [ ] ; Nocturia[ ]   . Vascular: Pain in legs with walking [ ] ; Pain in feet with lying flat [ ] ; Non-healing sores [ ] ; Stroke [ ] ; TIA [ ] ; Slurred speech [ ] ;  . Neuro: Headaches[ ] ; Vertigo[ ] ; Seizures[ ] ; Paresthesias[ ] ;Blurred vision [ ] ; Diplopia [ ] ; Vision changes [ ]   . Ortho/Skin: Arthritis [ ] ; Joint pain [Y ]; Muscle pain [ ] ; Joint swelling [ ] ; Back Pain [ Y]; Rash [ ]   . Psych: Depression[ ] ; Anxiety[ ]   . Heme: Bleeding problems [ ] ; Clotting disorders [ ] ; Anemia [ ]   . Endocrine: Diabetes [ ] ; Thyroid dysfunction[ ]   Home Medications Prior to Admission medications   Not on File    Past Medical History: Past Medical History:  Diagnosis Date  . Anxiety and depression   . COPD (chronic obstructive pulmonary disease) (HCC)    Long-term smoker greater than 35 years 1 to 2 pack a day.  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 GERD (gastroesophageal reflux disease)   . History of seizure disorder   . Hyperlipidemia   . Obesity   . Small cell lung cancer, left upper lobe ([place]) 2014   Treated with radiation and chemotherapy [facility])    Past Surgical History: Past Surgical History:  Procedure Laterality Date  . CORONARY/GRAFT ACUTE MI REVASCULARIZATION N/A 01/30/2021   Procedure: Coronary/Graft Acute MI Revascularization;  Surgeon: Leonie Man, MD;  Location: [facility];  Service: Cardiovascular;  Laterality: N/A;  . LEFT HEART CATH AND CORONARY ANGIOGRAPHY N/A 01/30/2021   Procedure: LEFT HEART CATH AND CORONARY  ANGIOGRAPHY;  Surgeon: Leonie Man, MD;  Location: [place] [facility];  Service: Cardiovascular;  Laterality: N/A;    Family History: Family History  Problem Relation Age of Onset  . Cancer Paternal Grandmother   . Cancer Paternal Aunt     Social History: Social History   Socioeconomic History  . Marital status: Divorced    Spouse name: Not on file  . Number of children: 2  . Years of education: Not on file  . Highest education level: Not on file  Occupational History  . Not on file  Tobacco Use  . Smoking status: Current Every Day Smoker    Packs/day: 1.50    Years: 40.00    Pack years: 60.00  . Smokeless tobacco: Never Used  Substance and Sexual Activity  . Alcohol use: Not Currently  . Drug use: Never  . Sexual activity: Not on file  Other Topics Concern  . Not on file  Social History Narrative   Currently divorced.  No longer working.  Previously worked in [employer].   [place].   Still smokes about 2 packs a day.   Social Determinants of Health   Financial Resource Strain: Not on file  Food Insecurity: Not on file  Transportation Needs: Not on file  Physical Activity: Not on file  Stress: Not on file  Social Connections: Not on file    Allergies:  Allergies  Allergen Reactions  . Penicillins Hives    Objective:    Vital Signs:   Temp:  [97.4 F (36.3 C)-98.2 F (36.8 C)] 97.4 F (36.3 C) (05/02 1112) Pulse Rate:  [33-117] 33 (05/02 1600) Resp:  [15-33] 29 (05/02 1600) BP: (75-138)/(58-109) 97/71 (05/02 1600) SpO2:  [96 %-100 %] 100 % (05/02 1600) Weight:  [44 kg] 44 kg (05/02 0500) Last BM Date: 01/30/21  Weight change: Filed Weights   01/30/21 2038 01/31/21 0500 02/01/21 0500  Weight: 44.1 kg 45.4 kg 44 kg    Intake/Output:   Intake/Output Summary (Last 24 hours) at 02/01/2021 1631 Last data filed at 02/01/2021 1600 Gross per 24 hour  Intake 470.81 ml  Output 1650 ml  Net -1179.19 ml      Physical  Exam    CVP 2  General:  Well appearing but looks older than actual age. No resp difficulty HEENT: + CVC Rt IJ, otherwise normal Neck: supple. JVP not elevated . Carotids 2+ bilat; no bruits. No lymphadenopathy or thyromegaly appreciated. Cor: PMI nondisplaced. Regular rate & rhythm. No rubs, gallops or murmurs. Lungs: clear Abdomen: soft, nontender, nondistended. No hepatosplenomegaly. No bruits or masses. Good bowel sounds. Extremities: no cyanosis, clubbing, rash, edema Neuro: alert & orientedx3, cranial nerves grossly intact. moves all 4 extremities w/o difficulty. Affect pleasant   Telemetry   SR w/ frequent PACs, runs of atrial tach   EKG    No new EKG to review today, admit EKG w/ anterior STE c/w acute MI   Labs   Basic Metabolic Panel: Recent Labs  Lab 01/30/21 1924 01/30/21 2115 01/31/21 0122 01/31/21 0122 01/31/21 0448 02/01/21 0516 02/01/21 1048  NA 125*  --  133*  --  133* 135 133*  K 2.1*  --  3.9  --  3.5 3.7 3.2*  CL 88*  --  103  --  101 101 103  CO2  --   --  23  --  23 25 21*  GLUCOSE 127*  --  151*  --  149* 114* 110*  BUN <3*  --  5*  --  5* <5* <5*  CREATININE 0.40* 0.73 0.63  --  0.67 0.66 0.77  CALCIUM  --   --  8.4*   < > 8.7* 8.7* 8.3*  MG  --   --   --   --  1.7  --   --    < > = values in this interval not displayed.    Liver Function Tests: No results for input(s): AST, ALT, ALKPHOS, BILITOT, PROT, ALBUMIN in the last 168 hours. No results for input(s): LIPASE, AMYLASE in the last 168 hours. No results for input(s): AMMONIA in the last 168 hours.  CBC: Recent Labs  Lab 01/30/21 1924 01/30/21 2115 01/31/21 0122 01/31/21 0448 02/01/21 0516  WBC  --  14.1* 10.5 10.9* 8.4  HGB 11.6* 12.6 11.8* 11.8* 11.6*  HCT 34.0* 35.5* 34.5* 34.4* 34.1*  MCV  --  89.6 92.2 93.0 92.9  PLT  --  340 339 323 273    Cardiac Enzymes: No results for input(s): CKTOTAL, CKMB, CKMBINDEX, TROPONINI in the last  168 hours.  BNP: BNP (last 3  results) Recent Labs    02/01/21 1100  BNP 792.9*    ProBNP (last 3 results) No results for input(s): PROBNP in the last 8760 hours.   CBG: No results for input(s): GLUCAP in the last 168 hours.  Coagulation Studies: Recent Labs    01/31/21 0448  LABPROT 12.9  INR 1.0     Imaging   DG Chest 2 View  Result Date: 02/01/2021 CLINICAL DATA:  History of lung cancer EXAM: CHEST - 2 VIEW COMPARISON:  Radiograph 11/25/2011 FINDINGS: Right IJ central venous catheter tip terminates at the right atrium. Telemetry leads overlie the chest. Chronically coarsened interstitial changes and bronchitic features. Bandlike opacity is seen in the left lung apex with some tenting of the left hemidiaphragm, could reflect subsegmental atelectasis or volume loss though underlying airspace disease or architectural distortion could have a similar appearance. No focal consolidative opacity is seen. The cardiomediastinal contours are unremarkable. Few coronary stents are noted. Age-indeterminate left sixth and seventh posterolateral left rib fractures. No other acute osseous or soft tissue abnormalities the chest wall. IMPRESSION: Chronically coarsened interstitial and bronchitic features. New bandlike opacity in left lung apex with tenting of the left hemidiaphragm. Could reflect scarring or atelectatic change with volume loss. Underlying airspace disease or mass is less favored though not fully excluded. Right IJ approach central venous catheter at the right atrium Posterolateral left sixth and seventh rib fractures, age indeterminate, correlate for point tenderness. Electronically Signed   By: Lovena Le M.D.   On: 02/01/2021 04:25     Medications:     Current Medications: . aspirin EC  81 mg Oral Daily  . atorvastatin  80 mg Oral Daily  . Chlorhexidine Gluconate Cloth  6 each Topical Daily  . dapagliflozin propanediol  5 mg Oral Daily  . heparin  5,000 Units Subcutaneous Q8H  . magnesium oxide  400 mg  Oral Daily  . sodium chloride flush  10-40 mL Intracatheter Q12H  . sodium chloride flush  3 mL Intravenous Q12H  . ticagrelor  90 mg Oral BID    Infusions: . sodium chloride    . sodium chloride 10 mL/hr at 02/01/21 1500  . sodium chloride 300 mL/hr at 02/01/21 1622  . norepinephrine (LEVOPHED) Adult infusion 1 mcg/min (02/01/21 1500)     Assessment/Plan   1. Acute Anterior STEMI/ CAD - cath w/ 99% pLAD occlusion treated w/ PCI/ DES + 80% dLAD to be treated medically - Hs trop >27,000 - Echo w/ EF 25-30%, repeat limited echo today w/o signs of mechanical complications  - Now CP free - Continue DAPT w/ ASA + Brilinta + high intensity statin - Smoking Cessation imperative    2. 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 No MR. RV normal. - initial concerns for early developing CGS after development of hypotension, now on NE at 13mcg. However Co-ox ok at 68%. STAT limited echo unchanged w/ no signs of new mechanical complications post MI. CXR showed no overt edema and CVP low at 2. Suspect she is dry, causing hypotension. BP now improved w/ low dose NE + after IVF bolus.  - Hold Farxiga for now w/ low CVP. No lasix - Wean NE as tolerated - Closely follow co-ox and CVPs  - BP will not tolerate addition of ARB/ARNi nor spiro at this time  3. HLD - LDL elevated at 139 mg/dL. Goal < 70 - now on high intensity statin - cardiology to follow lipids as outpatient   4. Rib Fractures - incidental finding on CXR, posterolateral left sixth and seventh rib - pt denies recent injury/ trauma - concern for pathologic fx in the setting of lung cancer history  - will need close f/u w/ PCP/oncology for further w/u. Consider CT scan while inpatient   5. H/o Lung Cancer - s/p chemo + radiation. Continues to smoke. CXR/ nontraumatic rib fx concerning for  recurrence. See above  6. COPD - active smoker    7. Hypokalemia - K 3.2 - supp KCl given  - follow BMP      Length of Stay: 2  Lyda Jester, [name]  02/01/2021, 4:31 PM  Advanced Heart Failure Team Pager [phone] (M-F; 7a - 5p)  Please contact [facility] for night-coverage after hours (4p -7a ) and weekends on [email or web address]   Agree with above.   65 y/o female smoker with COPD. Previous lung CA admitted with anterior STEMI. Found to have severe single vessel CAD and underwent emergent PCI of pLAD. We are asked to see for hypotension and possible developing cardiogenic shock.   Echo shows EF 25% with normal RV. No obvious mechanical complications.   Patient denies CP or SOB. Wants to go home.   SBP 80-90s (personally checked BP in both arms) on NE 1. CVP 1. Co-ox 68%. Frequent PACs on tele.   General:  Thin. Appears older than stated age 65: normal Neck: supple. no JVD. Carotids 2+ bilat; no bruits. No lymphadenopathy or thryomegaly appreciated. Cor: PMI nondisplaced. Regular rate & rhythm. No rubs, gallops or murmurs. Lungs: decreased throughout  Abdomen: soft, nontender, nondistended. No hepatosplenomegaly. No bruits or masses. Good bowel sounds. Extremities: no cyanosis, clubbing, rash, edema Neuro: alert & orientedx3, cranial nerves grossly intact. moves all 4 extremities w/o difficulty. Affect pleasant  She has severely depressed EF on echo but fortunately co-ox is ok. CVP is quite low. Will hold Farxiga and b-blocker for now. GIve 250-500cc NS and try to wean NE (keep MAP >= 70). Will add back GDMT slowly and follow CVP and co-ox. Frequent PACs may also be an issue and may need short course of amio  But we will follow for now.   Continue DAPT/statin. Continue CR. Place TED hose as needed.   AHF team till continue to follow. We appreciate the consult.   CRITICAL CARE Performed by: Glori Bickers  Total critical care time: 35 minutes  Critical care time  was exclusive of separately billable procedures and treating other patients.  Critical care was necessary to treat or prevent imminent or life-threatening deterioration.  Critical care was time spent personally by me (independent of midlevel providers or residents) on the following activities: development of treatment plan with patient and/or surrogate as well as nursing, discussions with consultants, evaluation of patient's response to treatment, examination of patient, obtaining history from patient or surrogate, ordering and performing treatments and interventions, ordering and review of laboratory studies, ordering and review of radiographic studies, pulse oximetry and re-evaluation of patient's condition.  Glori Bickers, MD  11:29 PM

## 2021-02-01 NOTE — Progress Notes (Signed)
  Echocardiogram 2D Echocardiogram has been performed.  Colleen Fleming F 02/01/2021, 3:49 PM

## 2021-02-01 NOTE — Progress Notes (Signed)
RN aware of order to remove CVC.

## 2021-02-01 NOTE — Progress Notes (Addendum)
   Having significant increase in supraventricular and ventricular ectopy.  Cannot exclude supraventricular ectopy with aberrancy.  Plan to replete potassium and pull central line.  Will give 10 mEq run intravenously over an hour and 20 mEq orally.  Repeat potassium at 6 PM.  Transduce the central line.  We will determine if a fluid challenge is possible.  We will give IV fluid only if right heart filling pressures are low.  Co. oximetry from the PICC line distal port.  We will hold on fluid challenge until this value is known. Initial CVP 8 mmHg. Repeat 2 mmHg. 300 cc saline bolus ordered.  Will hold beta-blocker therapy for now.  ? Developing cardiogenic shock. Start Levophed. Consult AHF service.  Stat repeat echo: Small to Moderate effusion about the same as yesterday. IVC collapses. RV is small.

## 2021-02-01 NOTE — TOC Benefit Eligibility Note (Signed)
Patient Teacher, English as a foreign language completed.    The patient is currently admitted and upon discharge could be taking Brilinta 90 mg.  The current 30 day co-pay is, $3.00.   The patient is insured through [employer] [place] Patient Advocate Specialist [place] Team Direct Number: [phone]  Fax: [phone]

## 2021-02-02 LAB — CBC
HCT: 31.5 % — ABNORMAL LOW (ref 36.0–46.0)
Hemoglobin: 10.6 g/dL — ABNORMAL LOW (ref 12.0–15.0)
MCH: 32.1 pg (ref 26.0–34.0)
MCHC: 33.7 g/dL (ref 30.0–36.0)
MCV: 95.5 fL (ref 80.0–100.0)
Platelets: 236 10*3/uL (ref 150–400)
RBC: 3.3 MIL/uL — ABNORMAL LOW (ref 3.87–5.11)
RDW: 12.5 % (ref 11.5–15.5)
WBC: 8.2 10*3/uL (ref 4.0–10.5)
nRBC: 0 % (ref 0.0–0.2)

## 2021-02-02 LAB — BASIC METABOLIC PANEL
Anion gap: 6 (ref 5–15)
BUN: <5 mg/dL — ABNORMAL LOW (ref 8–23)
CO2: 21 mmol/L — ABNORMAL LOW (ref 22–32)
Calcium: 8 mg/dL — ABNORMAL LOW (ref 8.9–10.3)
Chloride: 108 mmol/L (ref 98–111)
Creatinine, Ser: 0.66 mg/dL (ref 0.44–1.00)
GFR, Estimated: >60 mL/min (ref >60)
Glucose, Bld: 92 mg/dL (ref 70–99)
Potassium: 4.1 mmol/L (ref 3.5–5.1)
Sodium: 135 mmol/L (ref 135–145)

## 2021-02-02 LAB — COOXEMETRY PANEL
Carboxyhemoglobin: 1 % (ref 0.5–1.5)
Methemoglobin: 1 % (ref 0.0–1.5)
O2 Saturation: 72 %
Total hemoglobin: 10.4 g/dL — ABNORMAL LOW (ref 12.0–16.0)

## 2021-02-02 MED ORDER — PNEUMOCOCCAL VAC POLYVALENT 25 MCG/0.5ML IJ INJ
0.5000 mL | INJECTION | INTRAMUSCULAR | Status: AC
  Administered 2021-02-04: 0.5 mL via INTRAMUSCULAR
  Filled 2021-02-02: qty 0.5

## 2021-02-02 MED ORDER — LOPERAMIDE HCL 2 MG PO CAPS
2.0000 mg | ORAL_CAPSULE | ORAL | Status: DC | PRN
  Administered 2021-02-02: 2 mg via ORAL
  Filled 2021-02-02: qty 1

## 2021-02-02 MED ORDER — METOPROLOL SUCCINATE ER 25 MG PO TB24
25.0000 mg | ORAL_TABLET | Freq: Every day | ORAL | Status: DC
  Administered 2021-02-02 – 2021-02-04 (×3): 25 mg via ORAL
  Filled 2021-02-02 (×3): qty 1

## 2021-02-02 MED ORDER — SPIRONOLACTONE 12.5 MG HALF TABLET
12.5000 mg | ORAL_TABLET | Freq: Every day | ORAL | Status: DC
  Administered 2021-02-02 – 2021-02-04 (×3): 12.5 mg via ORAL
  Filled 2021-02-02 (×3): qty 1

## 2021-02-02 NOTE — Progress Notes (Addendum)
Advanced Heart Failure Rounding Note  PCP-Cardiologist: Glenetta Hew, MD   Subjective:    NE weaned off yesterday evening. No pressor requirements overnight. SBPs remain soft but stable, low 100s. CVP still low at 2. Co-ox pending.   K 4.1 Na 135 Scr 0.66  Continues w/ frequent PACs.   No events overnight. Resting comfortably. Denies CP. No dyspnea.    Objective:   Weight Range: 41.8 kg Body mass index is 19.94 kg/m.   Vital Signs:   Temp:  [97.4 F (36.3 C)-98.6 F (37 C)] 97.7 F (36.5 C) (05/03 0337) Pulse Rate:  [30-117] 87 (05/03 0600) Resp:  [15-31] 25 (05/03 0600) BP: (75-130)/(54-91) 102/91 (05/03 0600) SpO2:  [87 %-100 %] 99 % (05/03 0600) Weight:  [41.8 kg] 41.8 kg (05/03 0600) Last BM Date: 01/30/21  Weight change: Filed Weights   01/31/21 0500 02/01/21 0500 02/02/21 0600  Weight: 45.4 kg 44 kg 41.8 kg    Intake/Output:   Intake/Output Summary (Last 24 hours) at 02/02/2021 0734 Last data filed at 02/01/2021 2000 Gross per 24 hour  Intake 771.61 ml  Output 1200 ml  Net -428.39 ml      Physical Exam    CVP 2 General:  Well appearing appear looks much older than actual age. No resp difficulty HEENT: Normal Neck: Supple. JVP not elevated . + Rt IJ CVC. Carotids 2+ bilat; no bruits. No lymphadenopathy or thyromegaly appreciated. Cor: PMI nondisplaced. Irregular rhythm and rate. No rubs, gallops or murmurs. Lungs: Clear Abdomen: Soft, nontender, nondistended. No hepatosplenomegaly. No bruits or masses. Good bowel sounds. Extremities: No cyanosis, clubbing, rash, edema Neuro: Alert & orientedx3, cranial nerves grossly intact. moves all 4 extremities w/o difficulty. Affect pleasant   Telemetry   NSR w/ frequent PACs, 90s   EKG    No new EKG to review   Labs    CBC Recent Labs    02/01/21 0516 02/02/21 0531  WBC 8.4 8.2  HGB 11.6* 10.6*  HCT 34.1* 31.5*  MCV 92.9 95.5  PLT 273 944   Basic Metabolic Panel Recent Labs     01/31/21 0448 02/01/21 0516 02/01/21 1048 02/01/21 1800 02/02/21 0531  NA 133*   < > 133*  --  135  K 3.5   < > 3.2* 3.5 4.1  CL 101   < > 103  --  108  CO2 23   < > 21*  --  21*  GLUCOSE 149*   < > 110*  --  92  BUN 5*   < > <5*  --  <5*  CREATININE 0.67   < > 0.77  --  0.66  CALCIUM 8.7*   < > 8.3*  --  8.0*  MG 1.7  --   --   --   --    < > = values in this interval not displayed.   Liver Function Tests No results for input(s): AST, ALT, ALKPHOS, BILITOT, PROT, ALBUMIN in the last 72 hours. No results for input(s): LIPASE, AMYLASE in the last 72 hours. Cardiac Enzymes No results for input(s): CKTOTAL, CKMB, CKMBINDEX, TROPONINI in the last 72 hours.  BNP: BNP (last 3 results) Recent Labs    02/01/21 1100  BNP 792.9*    ProBNP (last 3 results) No results for input(s): PROBNP in the last 8760 hours.   D-Dimer No results for input(s): DDIMER in the last 72 hours. Hemoglobin A1C Recent Labs    01/31/21 0632  HGBA1C 5.4   Fasting  Lipid Panel Recent Labs    01/31/21 0632  CHOL 209*  HDL 59  LDLCALC 139*  TRIG 57  CHOLHDL 3.5   Thyroid Function Tests No results for input(s): TSH, T4TOTAL, T3FREE, THYROIDAB in the last 72 hours.  Invalid input(s): FREET3  Other results:   Imaging    ECHOCARDIOGRAM LIMITED  Result Date: 02/01/2021    ECHOCARDIOGRAM LIMITED REPORT   Patient Name:   Colleen Fleming Date of Exam: 02/01/2021 Medical Rec #:  [id number]     Height:       57.0 in Accession #:    [id number]    Weight:       97.0 lb Date of Birth:  26-Jan-1956     BSA:          1.322 m Patient Age:    65 years      BP:           94/64 mmHg Patient Gender: F             HR:           92 bpm. Exam Location:  Inpatient Procedure: Limited Color Doppler, Cardiac Doppler, Color Doppler and Limited            Echo STAT ECHO Indications:    Pericardial effusion with hypotension. Assess for tamponade  History:        Patient has prior history of Echocardiogram examinations, most                  recent 01/31/2021. CAD and Previous Myocardial Infarction,                 Abnormal ECG and Left heart cath; Signs/Symptoms:Hypotension.                 Frequent falls with broken ribs. Lung cancer.  Sonographer:    Merrie Roof Referring Phys: Wilson Comments: This was a limited echo to rule out tamponade. IMPRESSIONS  1. Left ventricular ejection fraction, by estimation, is 25%. The left ventricle has severely decreased function. The left ventricle demonstrates regional wall motion abnormalities with mid to apical anteroseptal/inferoseptal/anterior akinesis, apical lateral akinesis, and akinesis of the true apex. Consistent with large LAD territory MI. There is mild left ventricular hypertrophy.  2. Right ventricular systolic function is normal. The right ventricular size is normal.  3. Small to moderate pericardial effusion primarily adjacent to the RV. The IVC collapses around 50% with respiration. Difficult to interpret respirometry due to frequent ectopy. RV diastolic collapse is not present. No overt pericardial tamponade. Pericardial effusion is similar to the prior study.  4. The inferior vena cava is normal in size with <50% respiratory variability, suggesting right atrial pressure of 8 mmHg.  5. Limited echo FINDINGS  Left Ventricle: Left ventricular ejection fraction, by estimation, is 25%. The left ventricle has severely decreased function. The left ventricle demonstrates regional wall motion abnormalities. The left ventricular internal cavity size was normal in size. There is mild left ventricular hypertrophy. Right Ventricle: The right ventricular size is normal. No increase in right ventricular wall thickness. Right ventricular systolic function is normal. Left Atrium: Left atrial size was normal in size. Right Atrium: Right atrial size was normal in size. Pericardium: Small to moderate pericardial effusion primarily adjacent to the RV. The IVC collapses around 50%  with respiration. Difficult to interpret respirometry due to frequent ectopy. RV diastolic collapse is not present. No overt pericardial tamponade. Venous: The inferior  vena cava is normal in size with less than 50% respiratory variability, suggesting right atrial pressure of 8 mmHg. IAS/Shunts: No atrial level shunt detected by color flow Doppler. IVC IVC diam: 2.00 cm Loralie Champagne MD Electronically signed by Loralie Champagne MD Signature Date/Time: 02/01/2021/4:32:21 PM    Final       Medications:     Scheduled Medications: . aspirin EC  81 mg Oral Daily  . atorvastatin  80 mg Oral Daily  . Chlorhexidine Gluconate Cloth  6 each Topical Daily  . heparin  5,000 Units Subcutaneous Q8H  . magnesium oxide  400 mg Oral Daily  . sodium chloride flush  10-40 mL Intracatheter Q12H  . sodium chloride flush  3 mL Intravenous Q12H  . ticagrelor  90 mg Oral BID     Infusions: . sodium chloride    . sodium chloride 10 mL/hr at 02/01/21 1500  . sodium chloride 250 mL/hr at 02/01/21 1806  . norepinephrine (LEVOPHED) Adult infusion Stopped (02/01/21 1806)     PRN Medications:  sodium chloride, acetaminophen, morphine injection, nitroGLYCERIN, ondansetron (ZOFRAN) IV, sodium chloride flush, sodium chloride flush    Assessment/Plan   1. Acute Anterior STEMI/ CAD - cath w/ 99% pLAD occlusion treated w/ PCI/ DES + 80% dLAD to be treated medically - Hs trop >27,000 - Echo w/ EF 25-30%, no signs of mechanical complications  - Now CP free - Continue DAPT w/ ASA + Brilinta + high intensity statin - Continue to hold  blocker for now w/ low BP  - Smoking Cessation imperative    2. 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 No MR. RV normal. - initial concerns for early developing CGS after development of hypotension requiring initiation of NE. However Co-ox  ok at 68%. STAT limited echo unchanged w/ no signs of new mechanical complications post MI. CXR showed no overt edema and CVP low at 2. IVFs given. Diuretics and BP active meds held. - Now off NE. BP improved but remains soft.  - CVP still low at 2. Continue to hold [place].  - Closely follow co-ox and CVPs  - BP will not tolerate addition of ARB/ARNi nor spiro at this time    3. HLD - LDL elevated at 139 mg/dL. Goal < 70 - now on high intensity statin - cardiology to follow lipids as outpatient   4. Rib Fractures - incidental finding on CXR, posterolateral left sixth and seventh rib - pt denies recent injury/ trauma - concern for pathologic fx in the setting of lung cancer history  - will need close f/u w/ PCP/oncology for further w/u. Consider CT scan while inpatient   5. H/o Lung Cancer - s/p chemo + radiation. Continues to smoke. CXR/ nontraumatic rib fx concerning for recurrence. See above  6. COPD - active smoker    7. Hypokalemia - resolved w/ supplementation - K 4.1  8. PACs - still frequent, K 4.1 -  blocker on hold for soft BP - continue to monitor, may need short course of amio   Length of Stay: 3  Colleen Jester, [name]  02/02/2021, 7:34 AM  Advanced Heart Failure Team Pager [phone] (M-F; 7a - 5p)  Please contact [facility] for night-coverage after hours (5p -7a ) and weekends on [email or web address]  Patient seen and examined with the above-signed Advanced Practice Provider and/or Housestaff. I personally reviewed  laboratory data, imaging studies and relevant notes. I independently examined the patient and formulated the important aspects of the plan. I have edited the note to reflect any of my changes or salient points. I have personally discussed the plan with the patient and/or family.  BP soft but improved with IVFs. CVP still 2. Still with frequent PACs. Denies CP or SOB  General:  Weak appearing. No resp difficulty HEENT: normal Neck: supple. no JVD.  Carotids 2+ bilat; no bruits. No lymphadenopathy or thryomegaly appreciated. Cor: PMI nondisplaced. Irregular rate & rhythm. No rubs, gallops or murmurs. Lungs: clear Abdomen: soft, nontender, nondistended. No hepatosplenomegaly. No bruits or masses. Good bowel sounds. Extremities: no cyanosis, clubbing, rash, edema Neuro: alert & orientedx3, cranial nerves grossly intact. moves all 4 extremities w/o difficulty. Affect pleasant  BP improved. Co-ox stable. Will try to add low-dose b-blocker and spiro and see how she tolerates. Push po intake. Can go to the floor.   Glori Bickers, MD  8:22 AM

## 2021-02-02 NOTE — Progress Notes (Signed)
[id number] Talked with RN. Pt having diarrhea so will hold walk right now and focus on education. Pt to get immodium. Son in and education completed with him as per pt request. Discussed MI restrictions, NTG use, walking for ed, heart healthy and low sodium diets (keeping sodium to 2000 mg), daily weights and when to call MD, signs/symptoms of CHF, importance of brilinta with stent, and CRP 2. Discussed need for pt to quit smoking. Understanding voiced by son. Pt has also received education.  Will follow up tomorrow to walk. Graylon Good RN BSN 02/02/2021 2:07 PM

## 2021-02-02 NOTE — Progress Notes (Signed)
   She had a quiet night.  She looks comfortable.  Improved with hydration.  Heart failure team now managing.

## 2021-02-03 LAB — BASIC METABOLIC PANEL
Anion gap: 5 (ref 5–15)
BUN: <5 mg/dL — ABNORMAL LOW (ref 8–23)
CO2: 23 mmol/L (ref 22–32)
Calcium: 8.3 mg/dL — ABNORMAL LOW (ref 8.9–10.3)
Chloride: 103 mmol/L (ref 98–111)
Creatinine, Ser: 0.71 mg/dL (ref 0.44–1.00)
GFR, Estimated: >60 mL/min (ref >60)
Glucose, Bld: 111 mg/dL — ABNORMAL HIGH (ref 70–99)
Potassium: 3.8 mmol/L (ref 3.5–5.1)
Sodium: 131 mmol/L — ABNORMAL LOW (ref 135–145)

## 2021-02-03 LAB — MAGNESIUM: Magnesium: 1.7 mg/dL (ref 1.7–2.4)

## 2021-02-03 LAB — COOXEMETRY PANEL
Carboxyhemoglobin: 1 % (ref 0.5–1.5)
Methemoglobin: 1.6 % — ABNORMAL HIGH (ref 0.0–1.5)
O2 Saturation: 75 %
Total hemoglobin: 10.7 g/dL — ABNORMAL LOW (ref 12.0–16.0)

## 2021-02-03 MED ORDER — MAGNESIUM SULFATE 2 GM/50ML IV SOLN
2.0000 g | Freq: Once | INTRAVENOUS | Status: AC
  Administered 2021-02-03: 2 g via INTRAVENOUS
  Filled 2021-02-03: qty 50

## 2021-02-03 NOTE — Progress Notes (Signed)
Instructed patient on procedure. Monica RN @ BS. HOB less than 45*. Pt held breath. Pressure applied for 5 min. No s/sx of bleeding. Pressure drsg applied. Instructed pt to remain in bed for 34min. And report any s/sx of bleeding. Instructed to keep pressure drsg CDI for 24 hr. Fran Lowes, RN VAST

## 2021-02-03 NOTE — Progress Notes (Signed)
CARDIAC REHAB PHASE I   PRE:  Rate/Rhythm: 95 SR  BP:  Supine: 97/69  Sitting:   Standing:    SaO2: 96%RA  MODE:  Ambulation: 200 ft   POST:  Rate/Rhythm: 106 ST  BP:  Supine:   Sitting: 107/74  Standing:    SaO2: 96%RA 0939-1025 Pt walked 200 ft on RA with rolling walker, gait belt and asst x 1. Tolerated well with no CP or dizziness. To bed and then to [facility]. Pt with call bell. Encouraged to walk with sons at home after discharge.   Graylon Good, RN BSN  02/03/2021 10:21 AM

## 2021-02-03 NOTE — Progress Notes (Addendum)
Advanced Heart Failure Rounding Note  PCP-Cardiologist: Glenetta Hew, MD   Subjective:    Transferred out of ICU yesterday. No events overnight.    Tolerating  blocker and spiro ok. BP stable. Rhythm improved. No further PACs on tele. BMP pending. CVP still low 2-3. Not eating much. Dosent like the hospital food.   Much perkier today. Reports feeling better. No CP or dyspnea. Ambulated w/ CR and did well.    Objective:   Weight Range: 43.7 kg Body mass index is 20.84 kg/m.   Vital Signs:   Temp:  [98 F (36.7 C)-98.4 F (36.9 C)] 98 F (36.7 C) (05/04 0600) Pulse Rate:  [29-97] 84 (05/04 0950) Resp:  [17-32] 20 (05/03 2105) BP: (88-106)/(62-84) 97/69 (05/04 0950) SpO2:  [97 %-100 %] 99 % (05/04 0600) Weight:  [43.7 kg] 43.7 kg (05/04 0600) Last BM Date: 02/01/21  Weight change: Filed Weights   02/01/21 0500 02/02/21 0600 02/03/21 0600  Weight: 44 kg 41.8 kg 43.7 kg    Intake/Output:   Intake/Output Summary (Last 24 hours) at 02/03/2021 1041 Last data filed at 02/02/2021 2000 Gross per 24 hour  Intake 660 ml  Output --  Net 660 ml      Physical Exam    CVP 2-3 General:  Frail appearing. Looks older than actual age. No respiratory difficulty HEENT: normal Neck: supple. no JVD. +Rt IJ CVC Carotids 2+ bilat; no bruits. No lymphadenopathy or thyromegaly appreciated. Cor: PMI nondisplaced. Regular rate & rhythm. No rubs, gallops or murmurs. Lungs: clear Abdomen: soft, nontender, nondistended. No hepatosplenomegaly. No bruits or masses. Good bowel sounds. Extremities: no cyanosis, clubbing, rash, edema Neuro: alert & oriented x 3, cranial nerves grossly intact. moves all 4 extremities w/o difficulty. Affect pleasant.    Telemetry   NSR 80s. No further PACs  EKG    No new EKG to review   Labs    CBC Recent Labs    02/01/21 0516 02/02/21 0531  WBC 8.4 8.2  HGB 11.6* 10.6*  HCT 34.1* 31.5*  MCV 92.9 95.5  PLT 273 638   Basic Metabolic  Panel Recent Labs    02/01/21 1048 02/01/21 1800 02/02/21 0531 02/03/21 0500  NA 133*  --  135  --   K 3.2* 3.5 4.1  --   CL 103  --  108  --   CO2 21*  --  21*  --   GLUCOSE 110*  --  92  --   BUN <5*  --  <5*  --   CREATININE 0.77  --  0.66  --   CALCIUM 8.3*  --  8.0*  --   MG  --   --   --  1.7   Liver Function Tests No results for input(s): AST, ALT, ALKPHOS, BILITOT, PROT, ALBUMIN in the last 72 hours. No results for input(s): LIPASE, AMYLASE in the last 72 hours. Cardiac Enzymes No results for input(s): CKTOTAL, CKMB, CKMBINDEX, TROPONINI in the last 72 hours.  BNP: BNP (last 3 results) Recent Labs    02/01/21 1100  BNP 792.9*    ProBNP (last 3 results) No results for input(s): PROBNP in the last 8760 hours.   D-Dimer No results for input(s): DDIMER in the last 72 hours. Hemoglobin A1C No results for input(s): HGBA1C in the last 72 hours. Fasting Lipid Panel No results for input(s): CHOL, HDL, LDLCALC, TRIG, CHOLHDL, LDLDIRECT in the last 72 hours. Thyroid Function Tests No results for input(s): TSH, T4TOTAL, T3FREE,  THYROIDAB in the last 72 hours.  Invalid input(s): FREET3  Other results:   Imaging    No results found.   Medications:     Scheduled Medications: . aspirin EC  81 mg Oral Daily  . atorvastatin  80 mg Oral Daily  . Chlorhexidine Gluconate Cloth  6 each Topical Daily  . heparin  5,000 Units Subcutaneous Q8H  . magnesium oxide  400 mg Oral Daily  . metoprolol succinate  25 mg Oral Daily  . pneumococcal 23 valent vaccine  0.5 mL Intramuscular Tomorrow-1000  . sodium chloride flush  10-40 mL Intracatheter Q12H  . sodium chloride flush  3 mL Intravenous Q12H  . spironolactone  12.5 mg Oral Daily  . ticagrelor  90 mg Oral BID    Infusions: . sodium chloride    . norepinephrine (LEVOPHED) Adult infusion Stopped (02/01/21 1806)    PRN Medications: sodium chloride, acetaminophen, loperamide, morphine injection, nitroGLYCERIN,  ondansetron (ZOFRAN) IV, sodium chloride flush, sodium chloride flush    Assessment/Plan   1. Acute Anterior STEMI/ CAD - cath w/ 99% pLAD occlusion treated w/ PCI/ DES + 80% dLAD to be treated medically - Hs trop >27,000 - Echo w/ EF 25-30%, no signs of mechanical complications  - Now CP free - Continue DAPT w/ ASA + Brilinta + high intensity statin - Continue low dose Toprol 25 mg daily  - Continue CR  - Smoking Cessation imperative    2. 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 No MR. RV normal. - initial concerns for early developing CGS after development of hypotension requiring initiation of NE. However Co-ox ok at 68%. STAT limited echo unchanged w/ no signs of new mechanical complications post MI. CXR showed no overt edema and CVP low at 2. IVFs given. Diuretics and BP active meds held. - Now off NE. BP improved but remains soft, tolerating low dose  blocker and spiro ok  - Continue to hold [place].  - may try low dose losartan tomorrow if stable    3. HLD - LDL elevated at 139 mg/dL. Goal < 70 - now on high intensity statin - cardiology to follow lipids as outpatient   4. Rib Fractures - incidental finding on CXR, posterolateral left sixth and seventh rib - pt denies recent injury/ trauma - concern for pathologic fx in the setting of lung cancer history  - will need close f/u w/ PCP/oncology for further w/u. Consider CT scan while inpatient   5. H/o Lung Cancer - s/p chemo + radiation. Continues to smoke. CXR/ nontraumatic rib fx concerning for recurrence. See above  6. COPD - active smoker    7. Hypokalemia - BMP pendnig  8. PACs - resolved - continue low dose  blocker - keep K and Mg WNL   Continue to ambulate w/ CR. Will also place PT consult to assess for home health needs. Possible d/c home in next 24-48 hrs    Length of Stay: [address] [name]  02/03/2021, 10:41 AM  Advanced Heart Failure Team Pager [phone] (M-F; 7a - 5p)  Please contact [facility] for night-coverage after hours (5p -7a ) and weekends on [email or web address]   Patient seen and examined with the above-signed Advanced Practice Provider and/or Housestaff. I personally reviewed laboratory data, imaging studies and relevant notes. I independently examined the patient and formulated the important aspects of  the plan. I have edited the note to reflect any of my changes or salient points. I have personally discussed the plan with the patient and/or family.  She is much improved today. No CP or SOB. BP still a little soft but not symptomatic.   General:  Well appearing. No resp difficulty HEENT: normal Neck: supple. no JVD. Carotids 2+ bilat; no bruits. No lymphadenopathy or thryomegaly appreciated. Cor: PMI nondisplaced. Regular rate & rhythm. No rubs, gallops or murmurs. Lungs: clear Abdomen: soft, nontender, nondistended. No hepatosplenomegaly. No bruits or masses. Good bowel sounds. Extremities: no cyanosis, clubbing, rash, edema Neuro: alert & orientedx3, cranial nerves grossly intact. moves all 4 extremities w/o difficulty. Affect pleasant  She is tolerating low-dose GDMT. Will continue to ambulate. Possibly home tomorrow am.  Glori Bickers, MD  5:38 PM

## 2021-02-03 NOTE — TOC Initial Note (Addendum)
Transition of Care (TOC) - Initial/Assessment Note  Heart Failure   Patient Details  Name: Colleen Fleming MRN: [id number] Date of Birth: 1955/10/25  Transition of Care [facility]) CM/SW Contact:    Colleen Fleming Phone Number: 02/03/2021, 10:56 AM  Clinical Narrative:    CSW spoke with the patient at bedside and completed a very brief SDOH screening with the patient who denied having any needs at this time. Patient reported they do have a PCP, Colleen Fleming and they can get to the pharmacy to pick up their medications. [name] said that her son provides her with transportation and she is able to get to her appointments and pick up medications. [name] indicated that she used to receive Food Stamps roughly $300 monthly but hasn't had them for the past few months. CSW obtained [name]'s signature for the Food Stamp referral for the [facility] to reach out to them. CSW faxed over the Food Stamp application and informed the patient that the [facility] will be in touch. CSW provided [name] with the social workers name, number and position and to reach out to Colleen Fleming as other social needs arise.   4:30pm: CSW scheduled patient a hospital follow up with her primary care doctor, Colleen Fleming for Thursday 02/18/2021 at 11:00am.      TOC will continue to follow for d/c needs.     Barriers to Discharge: Continued Medical Work up   Patient Goals and CMS Choice        Expected Discharge Plan and Services   In-house Referral: Clinical Social Work                                            Prior Living Arrangements/Services   Lives with:: Colleen Fleming Patient language and need for interpreter reviewed:: Yes Do you feel safe going back to the place where you live?: Yes      Need for Family Participation in Patient Care: No (Comment) Care giver support system in place?: No (comment)   Criminal Activity/Legal Involvement Pertinent to Current  Situation/Hospitalization: No - Comment as needed  Activities of Daily Living Home Assistive Devices/Equipment: None ADL Screening (condition at time of admission) Patient's cognitive ability adequate to safely complete daily activities?: Yes Is the patient deaf or have difficulty hearing?: Yes Does the patient have difficulty seeing, even when wearing glasses/contacts?: No Does the patient have difficulty concentrating, remembering, or making decisions?: No Patient able to express need for assistance with ADLs?: No Does the patient have difficulty dressing or bathing?: No Independently performs ADLs?: Yes (appropriate for developmental age) Does the patient have difficulty walking or climbing stairs?: No Weakness of Legs: None Weakness of Arms/Hands: None  Permission Sought/Granted                  Emotional Assessment Appearance:: Appears stated age Attitude/Demeanor/Rapport: Engaged Affect (typically observed): Pleasant Orientation: : Oriented to Self,Oriented to Place,Oriented to  Time,Oriented to Situation Alcohol / Substance Use: Not Applicable Psych Involvement: No (comment)  Admission diagnosis:  STEMI (ST elevation myocardial infarction) [facility]) [I21.3] Patient Active Problem List   Diagnosis Date Noted  . STEMI (ST elevation myocardial infarction) ([place]) 01/30/2021  . Acute ST elevation myocardial infarction (STEMI) involving left anterior descending (LAD) coronary artery ([place])   . Hypokalemia   . Panlobular emphysema ([place])   . Coronary artery  disease involving native coronary artery of native heart with unstable angina pectoris ([place])   . GERD (gastroesophageal reflux disease) 11/25/2011  . Nicotine abuse 11/25/2011  . Hyperlipemia 11/25/2011  . IBS (irritable bowel syndrome) 11/25/2011  . Anxiety as acute reaction to exceptional stress 11/25/2011   PCP:  No primary care provider on file. Pharmacy:   [facility] [address], [place] [address] Phone: [phone] Fax: [place], [place]  [address] Phone: [phone] Fax: [phone]     Social Determinants of Health (SDOH) Interventions Food Insecurity Interventions: Intervention Not Indicated,Assist with SNAP Application (Patient reports she was receiving food stamps but needs to reapply. CSW to help with application.) Financial Strain Interventions: Intervention Not Indicated Housing Interventions: Intervention Not Indicated Transportation Interventions: Intervention Not Indicated  Readmission Risk Interventions No flowsheet data found.

## 2021-02-04 ENCOUNTER — Other Ambulatory Visit (HOSPITAL_COMMUNITY): Payer: Self-pay

## 2021-02-04 LAB — BASIC METABOLIC PANEL
Anion gap: 9 (ref 5–15)
BUN: 5 mg/dL — ABNORMAL LOW (ref 8–23)
CO2: 21 mmol/L — ABNORMAL LOW (ref 22–32)
Calcium: 8.2 mg/dL — ABNORMAL LOW (ref 8.9–10.3)
Chloride: 103 mmol/L (ref 98–111)
Creatinine, Ser: 0.72 mg/dL (ref 0.44–1.00)
GFR, Estimated: >60 mL/min (ref >60)
Glucose, Bld: 107 mg/dL — ABNORMAL HIGH (ref 70–99)
Potassium: 4.4 mmol/L (ref 3.5–5.1)
Sodium: 133 mmol/L — ABNORMAL LOW (ref 135–145)

## 2021-02-04 LAB — MAGNESIUM: Magnesium: 2.2 mg/dL (ref 1.7–2.4)

## 2021-02-04 MED ORDER — METOPROLOL SUCCINATE ER 25 MG PO TB24: 12.5000 mg | ORAL_TABLET | Freq: Every day | ORAL | Status: DC

## 2021-02-04 MED ORDER — TICAGRELOR 90 MG PO TABS
90.0000 mg | ORAL_TABLET | Freq: Two times a day (BID) | ORAL | 5 refills | Status: DC
  Filled 2021-02-04: qty 60, 30d supply, fill #0

## 2021-02-04 MED ORDER — ASPIRIN 81 MG PO TBEC
81.0000 mg | DELAYED_RELEASE_TABLET | Freq: Every day | ORAL | 11 refills | Status: DC
  Filled 2021-02-04: qty 30, 30d supply, fill #0

## 2021-02-04 MED ORDER — METOPROLOL SUCCINATE ER 25 MG PO TB24
12.5000 mg | ORAL_TABLET | Freq: Every day | ORAL | 5 refills | Status: DC
  Filled 2021-02-04: qty 30, 60d supply, fill #0

## 2021-02-04 MED ORDER — NITROGLYCERIN 0.4 MG SL SUBL
0.4000 mg | SUBLINGUAL_TABLET | SUBLINGUAL | 2 refills | Status: DC | PRN
  Filled 2021-02-04: qty 25, 8d supply, fill #0

## 2021-02-04 MED ORDER — ATORVASTATIN CALCIUM 80 MG PO TABS
80.0000 mg | ORAL_TABLET | Freq: Every day | ORAL | 5 refills | Status: AC
  Filled 2021-02-04: qty 30, 30d supply, fill #0

## 2021-02-04 MED ORDER — SPIRONOLACTONE 25 MG PO TABS
12.5000 mg | ORAL_TABLET | Freq: Every day | ORAL | 5 refills | Status: DC
  Filled 2021-02-04: qty 30, 60d supply, fill #0

## 2021-02-04 NOTE — Plan of Care (Signed)
  Problem: Education: Goal: Knowledge of General Education information will improve Description: Including pain rating scale, medication(s)/side effects and non-pharmacologic comfort measures Outcome: Adequate for Discharge   Problem: Health Behavior/Discharge Planning: Goal: Ability to manage health-related needs will improve Outcome: Adequate for Discharge   Problem: Clinical Measurements: Goal: Ability to maintain clinical measurements within normal limits will improve Outcome: Adequate for Discharge Goal: Will remain free from infection Outcome: Adequate for Discharge Goal: Diagnostic test results will improve Outcome: Adequate for Discharge Goal: Respiratory complications will improve Outcome: Adequate for Discharge Goal: Cardiovascular complication will be avoided Outcome: Adequate for Discharge   Problem: Activity: Goal: Risk for activity intolerance will decrease Outcome: Adequate for Discharge   Problem: Nutrition: Goal: Adequate nutrition will be maintained Outcome: Adequate for Discharge   Problem: Coping: Goal: Level of anxiety will decrease Outcome: Adequate for Discharge   Problem: Elimination: Goal: Will not experience complications related to bowel motility Outcome: Adequate for Discharge Goal: Will not experience complications related to urinary retention Outcome: Adequate for Discharge   Problem: Pain Managment: Goal: General experience of comfort will improve Outcome: Adequate for Discharge   Problem: Safety: Goal: Ability to remain free from injury will improve Outcome: Adequate for Discharge   Problem: Skin Integrity: Goal: Risk for impaired skin integrity will decrease Outcome: Adequate for Discharge   Problem: Education: Goal: Understanding of CV disease, CV risk reduction, and recovery process will improve Outcome: Adequate for Discharge Goal: Individualized Educational Video(s) Outcome: Adequate for Discharge   Problem:  Activity: Goal: Ability to return to baseline activity level will improve Outcome: Adequate for Discharge   Problem: Cardiovascular: Goal: Ability to achieve and maintain adequate cardiovascular perfusion will improve Outcome: Adequate for Discharge Goal: Vascular access site(s) Level 0-1 will be maintained Outcome: Adequate for Discharge   Problem: Health Behavior/Discharge Planning: Goal: Ability to safely manage health-related needs after discharge will improve Outcome: Adequate for Discharge   Problem: Education: Goal: Understanding of cardiac disease, CV risk reduction, and recovery process will improve Outcome: Adequate for Discharge Goal: Understanding of medication regimen will improve Outcome: Adequate for Discharge Goal: Individualized Educational Video(s) Outcome: Adequate for Discharge   Problem: Activity: Goal: Ability to tolerate increased activity will improve Outcome: Adequate for Discharge   Problem: Cardiac: Goal: Ability to achieve and maintain adequate cardiopulmonary perfusion will improve Outcome: Adequate for Discharge Goal: Vascular access site(s) Level 0-1 will be maintained Outcome: Adequate for Discharge   Problem: Health Behavior/Discharge Planning: Goal: Ability to safely manage health-related needs after discharge will improve Outcome: Adequate for Discharge

## 2021-02-04 NOTE — Progress Notes (Addendum)
Advanced Heart Failure Rounding Note  PCP-Cardiologist: Glenetta Hew, MD  AHF: Dr. Haroldine Laws   Subjective:    Feels well today. CVC out. BP remains soft, low 81X systolic but denies any orthostatic symptoms w/ standing/ambulation. Rhythm improved. No further PACs.   CO2 slightly low at 21  Denies CP. No dyspnea.    Objective:   Weight Range: 43.4 kg Body mass index is 20.71 kg/m.   Vital Signs:   Temp:  [97.7 F (36.5 C)-98.1 F (36.7 C)] 97.9 F (36.6 C) (05/05 0509) Pulse Rate:  [83-87] 83 (05/05 0509) Resp:  [15-16] 15 (05/04 2323) BP: (89-97)/(50-70) 89/60 (05/05 0509) SpO2:  [96 %-100 %] 96 % (05/04 2323) Weight:  [43.4 kg] 43.4 kg (05/05 0509) Last BM Date: 02/02/21  Weight change: Filed Weights   02/02/21 0600 02/03/21 0600 02/04/21 0509  Weight: 41.8 kg 43.7 kg 43.4 kg    Intake/Output:   Intake/Output Summary (Last 24 hours) at 02/04/2021 0946 Last data filed at 02/03/2021 2000 Gross per 24 hour  Intake 410 ml  Output --  Net 410 ml      Physical Exam    General:  Well appearing/ thin. No respiratory difficulty HEENT: normal Neck: supple. no JVD. Carotids 2+ bilat; no bruits. No lymphadenopathy or thyromegaly appreciated. Cor: PMI nondisplaced. Regular rate & rhythm. No rubs, gallops or murmurs. Lungs: clear Abdomen: soft, nontender, nondistended. No hepatosplenomegaly. No bruits or masses. Good bowel sounds. Extremities: no cyanosis, clubbing, rash, edema Neuro: alert & oriented x 3, cranial nerves grossly intact. moves all 4 extremities w/o difficulty. Affect pleasant.  Telemetry   NSR 80s. No further PACs  EKG    No new EKG to review   Labs    CBC Recent Labs    02/02/21 0531  WBC 8.2  HGB 10.6*  HCT 31.5*  MCV 95.5  PLT 914   Basic Metabolic Panel Recent Labs    02/03/21 0500 02/03/21 1122 02/04/21 0348  NA  --  131* 133*  K  --  3.8 4.4  CL  --  103 103  CO2  --  23 21*  GLUCOSE  --  111* 107*  BUN  --  <5* 5*   CREATININE  --  0.71 0.72  CALCIUM  --  8.3* 8.2*  MG 1.7  --  2.2   Liver Function Tests No results for input(s): AST, ALT, ALKPHOS, BILITOT, PROT, ALBUMIN in the last 72 hours. No results for input(s): LIPASE, AMYLASE in the last 72 hours. Cardiac Enzymes No results for input(s): CKTOTAL, CKMB, CKMBINDEX, TROPONINI in the last 72 hours.  BNP: BNP (last 3 results) Recent Labs    02/01/21 1100  BNP 792.9*    ProBNP (last 3 results) No results for input(s): PROBNP in the last 8760 hours.   D-Dimer No results for input(s): DDIMER in the last 72 hours. Hemoglobin A1C No results for input(s): HGBA1C in the last 72 hours. Fasting Lipid Panel No results for input(s): CHOL, HDL, LDLCALC, TRIG, CHOLHDL, LDLDIRECT in the last 72 hours. Thyroid Function Tests No results for input(s): TSH, T4TOTAL, T3FREE, THYROIDAB in the last 72 hours.  Invalid input(s): FREET3  Other results:   Imaging    No results found.   Medications:     Scheduled Medications: . aspirin EC  81 mg Oral Daily  . atorvastatin  80 mg Oral Daily  . Chlorhexidine Gluconate Cloth  6 each Topical Daily  . heparin  5,000 Units Subcutaneous Q8H  .  magnesium oxide  400 mg Oral Daily  . metoprolol succinate  25 mg Oral Daily  . pneumococcal 23 valent vaccine  0.5 mL Intramuscular Tomorrow-1000  . sodium chloride flush  10-40 mL Intracatheter Q12H  . sodium chloride flush  3 mL Intravenous Q12H  . spironolactone  12.5 mg Oral Daily  . ticagrelor  90 mg Oral BID    Infusions: . sodium chloride      PRN Medications: sodium chloride, acetaminophen, loperamide, morphine injection, nitroGLYCERIN, ondansetron (ZOFRAN) IV, sodium chloride flush, sodium chloride flush    Assessment/Plan   1. Acute Anterior STEMI/ CAD - cath w/ 99% pLAD occlusion treated w/ PCI/ DES + 80% dLAD to be treated medically - Hs trop >27,000 - Echo w/ EF 25-30%, no signs of mechanical complications  - Now CP free -  Continue DAPT w/ ASA + Brilinta + high intensity statin - Continue low dose Toprol, reduce to 12.5 mg daily  - Continue CR  - Smoking Cessation imperative    2. 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 No MR. RV normal. - initial concerns for early developing CGS after development of hypotension requiring initiation of NE. However Co-ox ok at 68%. STAT limited echo unchanged w/ no signs of new mechanical complications post MI. CXR showed no overt edema and CVP low at 2. IVFs given. Diuretics and BP active meds held. - Now off NE. BP improved but remains soft, tolerating low dose  blocker and spiro ok  - Continue to hold Farxiga.  - BP too soft for ARB/ARNi    3. HLD - LDL elevated at 139 mg/dL. Goal < 70 - now on high intensity statin - cardiology to follow lipids as outpatient   4. Rib Fractures - incidental finding on CXR, posterolateral left sixth and seventh rib - pt denies recent injury/ trauma - concern for pathologic fx in the setting of lung cancer history  - will need close f/u w/ PCP/oncology for further w/u. Consider CT scan while inpatient   5. H/o Lung Cancer - s/p chemo + radiation. Continues to smoke. CXR/ nontraumatic rib fx concerning for recurrence. See above  6. COPD - active smoker    7. Hypokalemia - BMP pendnig  8. PACs - resolved - continue low dose  blocker - keep K and Mg WNL   W/ soft BP, resolution of PACs and slightly low CO2 in setting of LV dysfunction, will reduce Toprol XL to 12.5 mg daily. Home later today.    Length of Stay: [address], [name]  02/04/2021, 9:46 AM  Advanced Heart Failure Team Pager [phone] (M-F; 7a - 5p)  Please contact [facility] for night-coverage after hours (5p -7a ) and weekends on [email or web address]   Patient seen and examined with the above-signed Advanced  Practice Provider and/or Housestaff. I personally reviewed laboratory data, imaging studies and relevant notes. I independently examined the patient and formulated the important aspects of the plan. I have edited the note to reflect any of my changes or salient points. I have personally discussed the plan with the patient and/or family.  Doing well. BP soft. Agree with med changes as above. Home today. F/u in HF Clinic next week.   Glori Bickers, MD  12:13 PM

## 2021-02-04 NOTE — TOC Progression Note (Addendum)
Transition of Care [facility] - Progression Note    Patient Details  Name: Colleen Fleming MRN: [id number] Date of Birth: 04-23-1956  Transition of Care [facility]) CM/SW Contact  Graves-Bigelow, Ocie Cornfield, RN Phone Number: 02/04/2021, 1:02 PM  Clinical Narrative: Case Manager spoke with patient regarding home health services. Patient is agreeable to home health services- patient has no preference for agency. [facility] was called to see if they can service- no staff availability. Case Manager then called [facility]- awaiting to hear back. Patient states she lives at home with her two sons-per patient her sons transport her to all appointments. Patient has a PCP Darnell Level. Case Manager will await to hear back from [facility].  02-04-21 [facility] will be able to service the patient with a delay in seeing the patient. Patient can be seen on Sunday at the earliest. Office to call the patient with updates. MD please place order for [facility] PT and F2F. No further needs from Case Manager at this time.      Expected Discharge Plan: [place] Barriers to Discharge: No Barriers Identified  Expected Discharge Plan and Services Expected Discharge Plan: [place] In-house Referral: NA Discharge Planning Services: CM Consult Post Acute Care Choice: [place] arrangements for the past 2 months: Single Family Home                 DME Arranged: N/A DME Agency: NA       HH Arranged: PT HH Agency: [place] ([place]) Date HH Agency Contacted: 02/04/21 Time [place]: 1301 Representative spoke with at [place]: [place] Determinants of Health ([place]) Interventions Food Insecurity Interventions: Intervention Not Indicated,Assist with [employer] Application (Patient reports she was receiving food stamps but needs to reapply. CSW to help with application.) Financial Strain Interventions: Intervention Not Indicated Housing  Interventions: Intervention Not Indicated Transportation Interventions: Intervention Not Indicated  Readmission Risk Interventions Readmission Risk Prevention Plan 02/04/2021  Post Dischage Appt Complete  Transportation Screening Complete  Some recent data might be hidden

## 2021-02-04 NOTE — Discharge Summary (Addendum)
Advanced Heart Failure Team  Discharge Summary   Patient ID: Colleen Fleming MRN: [id number], DOB/AGE: 02/09/1956 65 y.o. 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 Emergent LHC showed 99% pLAD occlusion treated w/ PCI + DES, also w/ 80% dLAD stenosis to be treated medically but no other coronary disease. Hs trop >27,000. LDL 139. Initial LVG at time of cath w/ mildly reduced LVEF, estimated ~45-50%. However 2D echo showed severely reduced LVEF, 25-30% w/ severe HK of the left ventricular, entire anteroseptal and anterior wall. No MR. RV normal. Small pericardial effusion.    She was placed on DAPT w/ ASA + Brilinta + high intensity statin + low dose  blocker and SGLT2i (hgb A1c 5.4).    On 5/2, she became hypotensive w/ concerns for early developing CGS and AHF team was consulted. CO2 was 21, though Co-ox was ok at 68%. She was started on NE for hypotension. Repeat limited echo showed no mechanical complications. RV was small. Small pericardial effusion also noted. CVP was low at 2.  Scr was normal at 0.77. Felt to be dry. Diuretics and [place] were discontinued and she was given IVF boluses. BP improved and she was able to wean off NE w/ stable co-ox. Her BP did remain soft but she was able to tolerate low dose Toprol XL (added for PACs) and low dose spiro. PACs resolved w/ low dose BP and correction of hypokalemia.   She ambulated w/ CR and did well. No exertional CP or dyspnea. PT was also consulted and recommended HHPT.   On 5/5, she was last seen and examined by Dr. Haroldine Laws and  felt stable for d/c home.  Close f/u in the [facility] arranged.   Cardiac Studies     Emergent LHC 01/30/21 Culprit lesion: Prox LAD lesion is 99% stenosed. (Calcified, thrombotic) A drug-eluting stent was successfully placed using a SYNERGY XD 2.50X16 -> postdilated 2.8 mm Post intervention, there is a 0% residual stenosis. Dist LAD lesion is 80% stenosed -> there is TIMI 0 flow to the apex, post PCI TIMI II flow, but still notable irregularities distally, likely distal embolism. Otherwise normal coronaries       Echo 01/31/21 1. No left ventricular thrombus. Left ventricular ejection fraction, by estimation, is 25 to 30%. The left ventricle has severely decreased function. The left ventricle demonstrates regional wall motion abnormalities (see scoring diagram/findings for description). Left ventricular diastolic parameters are consistent with Grade II diastolic dysfunction (pseudonormalization). Elevated left atrial pressure. There is severe hypokinesis of the left ventricular, entire anteroseptal wall and anterior wall. There is mild dyskinesis of the left ventricular, apical anterior wall and apical segment. 2. Right ventricular systolic function is normal. The right ventricular size is normal. There is mildly elevated pulmonary artery systolic pressure. 3. A small pericardial effusion is present. The pericardial effusion is circumferential. 4. The mitral valve is normal in structure. No evidence of mitral valve regurgitation. 5. The aortic valve is normal in structure. Aortic valve regurgitation is mild. 6. The inferior vena cava is normal in size with <  50% respiratory variability, suggesting right atrial pressure of 8 mmHg.  Discharge Weight Range: 95 lb Discharge Vitals: Blood pressure 91/72, pulse 79, temperature 97.7 F (36.5 C), temperature source Oral, resp. rate 16, height 4\' 9"  (1.448 m), weight 43.4 kg, SpO2 97 %.  Labs: Lab Results  Component Value Date   WBC 8.2 02/02/2021    HGB 10.6 (L) 02/02/2021   HCT 31.5 (L) 02/02/2021   MCV 95.5 02/02/2021   PLT 236 02/02/2021    Recent Labs  Lab 02/04/21 0348  NA 133*  K 4.4  CL 103  CO2 21*  BUN 5*  CREATININE 0.72  CALCIUM 8.2*  GLUCOSE 107*   Lab Results  Component Value Date   CHOL 209 (H) 01/31/2021   HDL 59 01/31/2021   LDLCALC 139 (H) 01/31/2021   TRIG 57 01/31/2021   BNP (last 3 results) Recent Labs    02/01/21 1100  BNP 792.9*    ProBNP (last 3 results) No results for input(s): PROBNP in the last 8760 hours.   Diagnostic Studies/Procedures   No results found.  Discharge Medications   Allergies as of 02/04/2021       Reactions   Penicillins Hives        Medication List     TAKE these medications    Aspirin Low Dose 81 MG EC tablet Generic drug: aspirin Take 1 tablet (81 mg total) by mouth daily. Swallow whole. Start taking on: Feb 05, 2021   atorvastatin 80 MG tablet Commonly known as: LIPITOR Take 1 tablet (80 mg total) by mouth daily. Start taking on: Feb 05, 2021   Brilinta 90 MG Tabs tablet Generic drug: ticagrelor Take 1 tablet (90 mg total) by mouth 2 (two) times daily.   metoprolol succinate 25 MG 24 hr tablet Commonly known as: TOPROL-XL Take 0.5 tablets (12.5 mg total) by mouth daily. Start taking on: Feb 05, 2021   nitroGLYCERIN 0.4 MG SL tablet Commonly known as: NITROSTAT Place 1 tablet (0.4 mg total) under the tongue every 5 (five) minutes x 3 doses as needed for chest pain.   spironolactone 25 MG tablet Commonly known as: ALDACTONE Take 0.5 tablets (12.5 mg total) by mouth daily.        Disposition   The patient will be discharged in stable condition to home. Discharge Instructions     Amb Referral to Cardiac Rehabilitation   Complete by: As directed    Diagnosis:  STEMI Coronary Stents     After initial evaluation and assessments completed: Virtual Based Care may be provided alone or in conjunction with Phase 2 Cardiac Rehab based  on patient barriers.: Yes       Follow-up Information     [place], [place], [name] Follow up.   Specialty: Family Medicine Why: Hospital follow up with your primary care doctor [facility] [name] for Thursday 02/18/21 at 2:00pm Contact information: [address] [place] [address] [phone]         [facility] Follow up.   Specialty: Home Health Services Why: Office to call with a visit time and start of care has a slight delay. Hope to receive a call by Sunday Feb 07, 2021. If you need to call the office please call [phone]         [facility] Follow up.   Specialty: Cardiology Why: Feb 18, 2021 and 10:30 AM  At The [facility] at [facility]  Hospital, [place] [phone] Contact information: [address] [id number] [place] [phone]                  Duration of Discharge Encounter: Greater than 35 minutes   Signed, Nelida Gores  02/04/2021, 5:27 PM  Patient seen and examined with the above-signed Advanced Practice Provider and/or Housestaff. I personally reviewed laboratory data, imaging studies and relevant notes. I independently examined the patient and formulated the important aspects of the plan. I have edited the note to reflect any of my changes or salient points. I have personally discussed the plan with the patient and/or family.  Of for d/c with close f/u in HF Clinic.  Glori Bickers, MD  10:43 PM

## 2021-02-04 NOTE — Evaluation (Signed)
Physical Therapy Evaluation Patient Details Name: Colleen Fleming MRN: [id number] DOB: 1956-05-24 Today's Date: 02/04/2021   History of Present Illness  65 y.o. female presents to [facility] ED on 01/30/2021 as a code STEMI. Pt underwent heart cath with successful DES PCI of the LAD on 01/30/2021. PMH of lung cancer, COPD, anxiety, DJD, GERD.  Clinical Impression  Pt demonstrates ability to ambulate for community distances with RW, without physical assistance. Pt tends to drift to the R and L during gait, but reports this is similar to her baseline. Pt feels she is limited by her balance at this time. When ambulating, pt becomes distracted by her surroundings, posing a safety concern. Pt will benefit from continued acute PT to address deficits in mobility and improve safety. Pt may benefit from HHPT for improved balance and independent mobility.    Follow Up Recommendations Home health PT    Equipment Recommendations  Rolling walker with 5" wheels    Recommendations for Other Services       Precautions / Restrictions Precautions Precautions: Fall Restrictions Weight Bearing Restrictions: No      Mobility  Bed Mobility Overal bed mobility: Needs Assistance Bed Mobility: Supine to Sit     Supine to sit: Modified independent (Device/Increase time) (HOB elevated)          Transfers Overall transfer level: Needs assistance Equipment used: Rolling walker (2 wheeled) Transfers: Sit to/from Stand Sit to Stand: Min guard            Ambulation/Gait Ambulation/Gait assistance: Min guard Gait Distance (Feet): 500 Feet Assistive device: Rolling walker (2 wheeled) Gait Pattern/deviations: Step-through pattern;Decreased stride length;Drifts right/left Gait velocity: reduced Gait velocity interpretation: 1.31 - 2.62 ft/sec, indicative of limited community ambulator General Gait Details: Pt ambulates at reduced speed, veering to R and L. Pt reports she's normally all over the place when  ambulating.  Stairs            Wheelchair Mobility    Modified Rankin (Stroke Patients Only)       Balance Overall balance assessment: Needs assistance Sitting-balance support: No upper extremity supported;Feet supported Sitting balance-Leahy Scale: Good     Standing balance support: No upper extremity supported;Bilateral upper extremity supported;During functional activity Standing balance-Leahy Scale: Fair Standing balance comment: Pt able to don face mask during static standing, not requiring UE support.                             Pertinent Vitals/Pain Pain Assessment: No/denies pain    Home Living Family/patient expects to be discharged to:: Private residence Living Arrangements: Children Available Help at Discharge: Family;Available 24 hours/day Type of Home: House Home Access: Level entry     Home Layout: One level Home Equipment: Cane - single point      Prior Function Level of Independence: Independent with assistive device(s)         Comments: independent with cane.     Hand Dominance        Extremity/Trunk Assessment   Upper Extremity Assessment Upper Extremity Assessment: Overall WFL for tasks assessed    Lower Extremity Assessment Lower Extremity Assessment: Overall WFL for tasks assessed    Cervical / Trunk Assessment Cervical / Trunk Assessment: Normal  Communication   Communication: HOH  Cognition Arousal/Alertness: Awake/alert Behavior During Therapy: WFL for tasks assessed/performed Overall Cognitive Status: Within Functional Limits for tasks assessed  General Comments General comments (skin integrity, edema, etc.): Pt demonstrates veering to R and L with ambulation, reporting that this is normal for her. Pt appears somewhat distracted during gait, scanning her environment and not paying attention to obstacles around her. Pt reports feelings of instability  with mobility.    Exercises     Assessment/Plan    PT Assessment Patient needs continued PT services  PT Problem List Decreased balance;Decreased mobility       PT Treatment Interventions DME instruction;Gait training;Functional mobility training;Therapeutic activities;Therapeutic exercise;Balance training    PT Goals (Current goals can be found in the Care Plan section)  Acute Rehab PT Goals Patient Stated Goal: To return home PT Goal Formulation: With patient Time For Goal Achievement: 02/18/21 Potential to Achieve Goals: Good Additional Goals Additional Goal #1: Pt will score >19/24 on DGI to indicate a reduced risk for falls    Frequency Min 3X/week   Barriers to discharge        Co-evaluation               AM-PAC PT "6 Clicks" Mobility  Outcome Measure Help needed turning from your back to your side while in a flat bed without using bedrails?: A Little Help needed moving from lying on your back to sitting on the side of a flat bed without using bedrails?: A Little Help needed moving to and from a bed to a chair (including a wheelchair)?: A Little Help needed standing up from a chair using your arms (e.g., wheelchair or bedside chair)?: A Little Help needed to walk in hospital room?: A Little Help needed climbing 3-5 steps with a railing? : A Little 6 Click Score: 18    End of Session Equipment Utilized During Treatment: Gait belt Activity Tolerance: Patient tolerated treatment well Patient left: in bed;with call bell/phone within reach;with bed alarm set Nurse Communication: Mobility status PT Visit Diagnosis: Unsteadiness on feet (R26.81);Other abnormalities of gait and mobility (R26.89)    Time: 1941-7408 PT Time Calculation (min) (ACUTE ONLY): 21 min   Charges:   PT Evaluation $PT Eval Low Complexity: 1 Low          Acute Rehab  Pager: [phone]   Garwin Brothers, SPT 02/04/2021, 11:26 AM

## 2021-02-04 NOTE — TOC Transition Note (Addendum)
Transition of Care [facility]) - CM/SW Discharge Note Heart Failure   Patient Details  Name: Colleen Fleming MRN: [id number] Date of Birth: 02-16-1956  [facility] CM/SW Contact:  [place], [place] Phone Number: 02/04/2021, 12:13 PM   Clinical Narrative:    CSW spoke with the patient at bedside and confirmed the patient has transportation home and patient reported her friend in the room would take her and her son can take her to her appointments. CSW brought the patient an appointment card and reiterated to the patient to follow up with the [place] clinic appointment. CSW scheduled the patient a hospital follow up with her primary care doctor and the patient confirmed both of those appointments would work for her. Ms. Dettloff asked for her medications to be sent to the [facility] on [address] and CSW informed the Cardiologist who reported we will give her the first 30 days and refills will go to [facility]. CSW informed the patient of this information and she reported understanding.  CSW will sign off for now as social work intervention is no longer needed. Please consult [place] again if new needs arise.      Barriers to Discharge: Continued Medical Work up   Patient Goals and CMS Choice        Discharge Placement                       Discharge Plan and Services In-house Referral: Clinical Social Work                                   Social Determinants of Health (SDOH) Interventions Food Insecurity Interventions: Intervention Not Indicated,Assist with [employer] Application (Patient reports she was receiving food stamps but needs to reapply. CSW to help with application.) Financial Strain Interventions: Intervention Not Indicated Housing Interventions: Intervention Not Indicated Transportation Interventions: Intervention Not Indicated   Readmission Risk Interventions No flowsheet data found.   Nasirah Sachs, MSW, [place] Heart Failure Social Worker

## 2021-02-05 ENCOUNTER — Telehealth (HOSPITAL_COMMUNITY): Payer: Self-pay | Admitting: Pharmacist

## 2021-02-05 ENCOUNTER — Other Ambulatory Visit (HOSPITAL_COMMUNITY): Payer: Self-pay

## 2021-02-05 NOTE — Progress Notes (Addendum)
Ms. Fiorello called CSW today 02/05/21 at 4:35pm asking about Medicare and finding a dentist. CSW informed Ms. Erney that typically Medicare is for individuals 29 years and older unless she has a qualifying condition. Ms. Courser reported she also needs a dentist. CSW informed Ms. Bovey that she will reach out to the outpatient social worker who will follow up with her at her [facility] outpatient appointment coming up.  CSW sent an in basket message to [place], the outpatient CSW with the [facility] clinic to follow up with the patient at her upcoming appointment.     Amel Gianino, MSW, [place] Heart Failure Social Worker

## 2021-02-05 NOTE — Telephone Encounter (Signed)
Reached pt to follow up on Brilinta.  Was able to review lovastatin is replaced by Atorvastatin and to continue Sertraline and Bupropion.  Son is primary caregiver and is helping patient with medications but was at work.  Discussed side effects of blood thinner and signs of bleeding.  (bleeding gums and coffee ground appearance of stool)  Advised patient to take all medications to office visit next week.

## 2021-02-08 ENCOUNTER — Telehealth (HOSPITAL_COMMUNITY): Payer: Self-pay | Admitting: Licensed Clinical Social Worker

## 2021-02-08 NOTE — Telephone Encounter (Signed)
CSW received consult from inpatient HF TOC CSW to follow up with pt regarding questions with getting Medicare and finding a dental provider.  Pt wondering when she will be eligible for Medicare.  Pt on SSI so will not be eligible for Medicare until she turns 66yo- informed pt who expressed understanding.  Pt also wondering about getting a dentist.  Informed pt that she can see a dentist under her Medicaid and should just call the member services number on her card in order to speak with someone and get list of in network dental providers.  No further questions at this time  Will continue to follow and assist as needed  Jorge Ny, [facility] Desk#: [phone] Cell#: [phone]

## 2021-02-09 ENCOUNTER — Inpatient Hospital Stay (HOSPITAL_COMMUNITY)
Admission: EM | Admit: 2021-02-09 | Discharge: 2021-02-20 | DRG: 177 | Disposition: A | Discharge status: Skilled Nursing Facility | Payer: Medicaid Other | Attending: Internal Medicine | Admitting: Internal Medicine

## 2021-02-09 ENCOUNTER — Other Ambulatory Visit: Payer: Self-pay

## 2021-02-09 ENCOUNTER — Encounter (HOSPITAL_COMMUNITY): Payer: Self-pay | Admitting: Emergency Medicine

## 2021-02-09 ENCOUNTER — Emergency Department (HOSPITAL_COMMUNITY): Payer: Medicaid Other

## 2021-02-09 DIAGNOSIS — M47814 Spondylosis without myelopathy or radiculopathy, thoracic region: Secondary | ICD-10-CM | POA: Diagnosis present

## 2021-02-09 DIAGNOSIS — J449 Chronic obstructive pulmonary disease, unspecified: Secondary | ICD-10-CM | POA: Diagnosis present

## 2021-02-09 DIAGNOSIS — U071 COVID-19: Principal | ICD-10-CM | POA: Diagnosis present

## 2021-02-09 DIAGNOSIS — I313 Pericardial effusion (noninflammatory): Secondary | ICD-10-CM | POA: Diagnosis present

## 2021-02-09 DIAGNOSIS — E871 Hypo-osmolality and hyponatremia: Secondary | ICD-10-CM | POA: Diagnosis present

## 2021-02-09 DIAGNOSIS — G40909 Epilepsy, unspecified, not intractable, without status epilepticus: Secondary | ICD-10-CM | POA: Diagnosis present

## 2021-02-09 DIAGNOSIS — F419 Anxiety disorder, unspecified: Secondary | ICD-10-CM | POA: Diagnosis present

## 2021-02-09 DIAGNOSIS — S2242XA Multiple fractures of ribs, left side, initial encounter for closed fracture: Secondary | ICD-10-CM | POA: Diagnosis present

## 2021-02-09 DIAGNOSIS — I951 Orthostatic hypotension: Secondary | ICD-10-CM | POA: Diagnosis not present

## 2021-02-09 DIAGNOSIS — F17210 Nicotine dependence, cigarettes, uncomplicated: Secondary | ICD-10-CM | POA: Diagnosis present

## 2021-02-09 DIAGNOSIS — Z880 Allergy status to penicillin: Secondary | ICD-10-CM

## 2021-02-09 DIAGNOSIS — Z9221 Personal history of antineoplastic chemotherapy: Secondary | ICD-10-CM

## 2021-02-09 DIAGNOSIS — I255 Ischemic cardiomyopathy: Secondary | ICD-10-CM | POA: Diagnosis present

## 2021-02-09 DIAGNOSIS — R531 Weakness: Secondary | ICD-10-CM

## 2021-02-09 DIAGNOSIS — Z955 Presence of coronary angioplasty implant and graft: Secondary | ICD-10-CM

## 2021-02-09 DIAGNOSIS — E872 Acidosis: Secondary | ICD-10-CM | POA: Diagnosis present

## 2021-02-09 DIAGNOSIS — Z28310 Unvaccinated for covid-19: Secondary | ICD-10-CM

## 2021-02-09 DIAGNOSIS — F32A Depression, unspecified: Secondary | ICD-10-CM | POA: Diagnosis present

## 2021-02-09 DIAGNOSIS — I2109 ST elevation (STEMI) myocardial infarction involving other coronary artery of anterior wall: Secondary | ICD-10-CM | POA: Diagnosis present

## 2021-02-09 DIAGNOSIS — K219 Gastro-esophageal reflux disease without esophagitis: Secondary | ICD-10-CM | POA: Diagnosis present

## 2021-02-09 DIAGNOSIS — Z923 Personal history of irradiation: Secondary | ICD-10-CM

## 2021-02-09 DIAGNOSIS — X58XXXA Exposure to other specified factors, initial encounter: Secondary | ICD-10-CM | POA: Diagnosis present

## 2021-02-09 DIAGNOSIS — K529 Noninfective gastroenteritis and colitis, unspecified: Secondary | ICD-10-CM | POA: Diagnosis present

## 2021-02-09 DIAGNOSIS — D649 Anemia, unspecified: Secondary | ICD-10-CM | POA: Diagnosis present

## 2021-02-09 DIAGNOSIS — I252 Old myocardial infarction: Secondary | ICD-10-CM

## 2021-02-09 DIAGNOSIS — Z79899 Other long term (current) drug therapy: Secondary | ICD-10-CM

## 2021-02-09 DIAGNOSIS — E861 Hypovolemia: Secondary | ICD-10-CM | POA: Diagnosis present

## 2021-02-09 DIAGNOSIS — I5022 Chronic systolic (congestive) heart failure: Secondary | ICD-10-CM | POA: Diagnosis present

## 2021-02-09 DIAGNOSIS — E785 Hyperlipidemia, unspecified: Secondary | ICD-10-CM | POA: Diagnosis present

## 2021-02-09 DIAGNOSIS — Z85118 Personal history of other malignant neoplasm of bronchus and lung: Secondary | ICD-10-CM

## 2021-02-09 DIAGNOSIS — E876 Hypokalemia: Secondary | ICD-10-CM | POA: Diagnosis present

## 2021-02-09 DIAGNOSIS — I2510 Atherosclerotic heart disease of native coronary artery without angina pectoris: Secondary | ICD-10-CM | POA: Diagnosis present

## 2021-02-09 DIAGNOSIS — Z7982 Long term (current) use of aspirin: Secondary | ICD-10-CM

## 2021-02-09 LAB — CBC
HCT: 32.7 % — ABNORMAL LOW (ref 36.0–46.0)
Hemoglobin: 11.5 g/dL — ABNORMAL LOW (ref 12.0–15.0)
MCH: 32.1 pg (ref 26.0–34.0)
MCHC: 35.2 g/dL (ref 30.0–36.0)
MCV: 91.3 fL (ref 80.0–100.0)
Platelets: 282 10*3/uL (ref 150–400)
RBC: 3.58 MIL/uL — ABNORMAL LOW (ref 3.87–5.11)
RDW: 12.1 % (ref 11.5–15.5)
WBC: 4.1 10*3/uL (ref 4.0–10.5)
nRBC: 0 % (ref 0.0–0.2)

## 2021-02-09 LAB — BASIC METABOLIC PANEL
Anion gap: 10 (ref 5–15)
BUN: 5 mg/dL — ABNORMAL LOW (ref 8–23)
CO2: 20 mmol/L — ABNORMAL LOW (ref 22–32)
Calcium: 7.7 mg/dL — ABNORMAL LOW (ref 8.9–10.3)
Chloride: 87 mmol/L — ABNORMAL LOW (ref 98–111)
Creatinine, Ser: 0.77 mg/dL (ref 0.44–1.00)
GFR, Estimated: >60 mL/min (ref >60)
Glucose, Bld: 134 mg/dL — ABNORMAL HIGH (ref 70–99)
Potassium: 2.8 mmol/L — ABNORMAL LOW (ref 3.5–5.1)
Sodium: 117 mmol/L — CL (ref 135–145)

## 2021-02-09 MED ORDER — LACTATED RINGERS IV BOLUS: 1000.0000 mL | Freq: Once | INTRAVENOUS | Status: DC

## 2021-02-09 MED ORDER — LACTATED RINGERS IV BOLUS
500.0000 mL | Freq: Once | INTRAVENOUS | Status: AC
  Administered 2021-02-09: 500 mL via INTRAVENOUS

## 2021-02-09 NOTE — ED Triage Notes (Signed)
Pt BIB [facility] from home, d/c from hospital yesterday after being admitted for a STEMI. Pt c/o diarrhea and increased weakness. Denies chest pain/shortness of breath. Hypotensive with EMS, initial SBP 90, given 500ccNS with improvement to 789 systolic.

## 2021-02-09 NOTE — ED Notes (Signed)
Received pt from lobby per wheelchair at this time.

## 2021-02-10 ENCOUNTER — Telehealth (HOSPITAL_COMMUNITY): Payer: Self-pay

## 2021-02-10 DIAGNOSIS — Z951 Presence of aortocoronary bypass graft: Secondary | ICD-10-CM | POA: Diagnosis not present

## 2021-02-10 DIAGNOSIS — Z7982 Long term (current) use of aspirin: Secondary | ICD-10-CM | POA: Diagnosis not present

## 2021-02-10 DIAGNOSIS — S0591XA Unspecified injury of right eye and orbit, initial encounter: Secondary | ICD-10-CM | POA: Diagnosis present

## 2021-02-10 DIAGNOSIS — E785 Hyperlipidemia, unspecified: Secondary | ICD-10-CM | POA: Diagnosis present

## 2021-02-10 DIAGNOSIS — I959 Hypotension, unspecified: Secondary | ICD-10-CM | POA: Diagnosis not present

## 2021-02-10 DIAGNOSIS — E876 Hypokalemia: Secondary | ICD-10-CM | POA: Diagnosis present

## 2021-02-10 DIAGNOSIS — K529 Noninfective gastroenteritis and colitis, unspecified: Secondary | ICD-10-CM | POA: Diagnosis present

## 2021-02-10 DIAGNOSIS — F17210 Nicotine dependence, cigarettes, uncomplicated: Secondary | ICD-10-CM | POA: Diagnosis not present

## 2021-02-10 DIAGNOSIS — Z85118 Personal history of other malignant neoplasm of bronchus and lung: Secondary | ICD-10-CM | POA: Diagnosis not present

## 2021-02-10 DIAGNOSIS — Z923 Personal history of irradiation: Secondary | ICD-10-CM | POA: Diagnosis not present

## 2021-02-10 DIAGNOSIS — G40909 Epilepsy, unspecified, not intractable, without status epilepticus: Secondary | ICD-10-CM | POA: Diagnosis present

## 2021-02-10 DIAGNOSIS — W010XXA Fall on same level from slipping, tripping and stumbling without subsequent striking against object, initial encounter: Secondary | ICD-10-CM | POA: Diagnosis not present

## 2021-02-10 DIAGNOSIS — U071 COVID-19: Principal | ICD-10-CM

## 2021-02-10 DIAGNOSIS — I5021 Acute systolic (congestive) heart failure: Secondary | ICD-10-CM | POA: Diagnosis not present

## 2021-02-10 DIAGNOSIS — I2510 Atherosclerotic heart disease of native coronary artery without angina pectoris: Secondary | ICD-10-CM | POA: Diagnosis not present

## 2021-02-10 DIAGNOSIS — K219 Gastro-esophageal reflux disease without esophagitis: Secondary | ICD-10-CM | POA: Diagnosis present

## 2021-02-10 DIAGNOSIS — S2242XA Multiple fractures of ribs, left side, initial encounter for closed fracture: Secondary | ICD-10-CM | POA: Diagnosis present

## 2021-02-10 DIAGNOSIS — I313 Pericardial effusion (noninflammatory): Secondary | ICD-10-CM | POA: Diagnosis present

## 2021-02-10 DIAGNOSIS — I5022 Chronic systolic (congestive) heart failure: Secondary | ICD-10-CM | POA: Diagnosis present

## 2021-02-10 DIAGNOSIS — E871 Hypo-osmolality and hyponatremia: Secondary | ICD-10-CM | POA: Diagnosis present

## 2021-02-10 DIAGNOSIS — E861 Hypovolemia: Secondary | ICD-10-CM | POA: Diagnosis present

## 2021-02-10 DIAGNOSIS — D649 Anemia, unspecified: Secondary | ICD-10-CM | POA: Diagnosis present

## 2021-02-10 DIAGNOSIS — M47814 Spondylosis without myelopathy or radiculopathy, thoracic region: Secondary | ICD-10-CM | POA: Diagnosis present

## 2021-02-10 DIAGNOSIS — S01111A Laceration without foreign body of right eyelid and periocular area, initial encounter: Secondary | ICD-10-CM | POA: Diagnosis not present

## 2021-02-10 DIAGNOSIS — J449 Chronic obstructive pulmonary disease, unspecified: Secondary | ICD-10-CM | POA: Diagnosis not present

## 2021-02-10 DIAGNOSIS — I255 Ischemic cardiomyopathy: Secondary | ICD-10-CM | POA: Diagnosis present

## 2021-02-10 DIAGNOSIS — X58XXXA Exposure to other specified factors, initial encounter: Secondary | ICD-10-CM | POA: Diagnosis present

## 2021-02-10 DIAGNOSIS — F32A Depression, unspecified: Secondary | ICD-10-CM | POA: Diagnosis present

## 2021-02-10 DIAGNOSIS — I2109 ST elevation (STEMI) myocardial infarction involving other coronary artery of anterior wall: Secondary | ICD-10-CM | POA: Diagnosis present

## 2021-02-10 DIAGNOSIS — E872 Acidosis: Secondary | ICD-10-CM | POA: Diagnosis present

## 2021-02-10 DIAGNOSIS — F419 Anxiety disorder, unspecified: Secondary | ICD-10-CM | POA: Diagnosis present

## 2021-02-10 DIAGNOSIS — Z23 Encounter for immunization: Secondary | ICD-10-CM | POA: Diagnosis not present

## 2021-02-10 LAB — COMPREHENSIVE METABOLIC PANEL
ALT: 25 U/L (ref 0–44)
AST: 28 U/L (ref 15–41)
Albumin: 3.1 g/dL — ABNORMAL LOW (ref 3.5–5.0)
Alkaline Phosphatase: 72 U/L (ref 38–126)
Anion gap: 10 (ref 5–15)
BUN: 5 mg/dL — ABNORMAL LOW (ref 8–23)
CO2: 20 mmol/L — ABNORMAL LOW (ref 22–32)
Calcium: 7.9 mg/dL — ABNORMAL LOW (ref 8.9–10.3)
Chloride: 91 mmol/L — ABNORMAL LOW (ref 98–111)
Creatinine, Ser: 0.76 mg/dL (ref 0.44–1.00)
GFR, Estimated: >60 mL/min (ref >60)
Glucose, Bld: 118 mg/dL — ABNORMAL HIGH (ref 70–99)
Potassium: 3.1 mmol/L — ABNORMAL LOW (ref 3.5–5.1)
Sodium: 121 mmol/L — ABNORMAL LOW (ref 135–145)
Total Bilirubin: 0.7 mg/dL (ref 0.3–1.2)
Total Protein: 5.4 g/dL — ABNORMAL LOW (ref 6.5–8.1)

## 2021-02-10 LAB — SODIUM, URINE, RANDOM: Sodium, Ur: 18 mmol/L

## 2021-02-10 LAB — URINALYSIS, ROUTINE W REFLEX MICROSCOPIC
Bilirubin Urine: NEGATIVE
Glucose, UA: NEGATIVE mg/dL
Ketones, ur: NEGATIVE mg/dL
Leukocytes,Ua: NEGATIVE
Nitrite: NEGATIVE
Protein, ur: NEGATIVE mg/dL
Specific Gravity, Urine: 1.004 — ABNORMAL LOW (ref 1.005–1.030)
pH: 6 (ref 5.0–8.0)

## 2021-02-10 LAB — TSH: TSH: 1.14 u[IU]/mL (ref 0.350–4.500)

## 2021-02-10 LAB — LACTATE DEHYDROGENASE: LDH: 225 U/L — ABNORMAL HIGH (ref 98–192)

## 2021-02-10 LAB — MAGNESIUM
Magnesium: 1.6 mg/dL — ABNORMAL LOW (ref 1.7–2.4)
Magnesium: 1.5 mg/dL — ABNORMAL LOW (ref 1.7–2.4)

## 2021-02-10 LAB — CBC
HCT: 30.9 % — ABNORMAL LOW (ref 36.0–46.0)
Hemoglobin: 10.6 g/dL — ABNORMAL LOW (ref 12.0–15.0)
MCH: 31.8 pg (ref 26.0–34.0)
MCHC: 34.3 g/dL (ref 30.0–36.0)
MCV: 92.8 fL (ref 80.0–100.0)
Platelets: 256 10*3/uL (ref 150–400)
RBC: 3.33 MIL/uL — ABNORMAL LOW (ref 3.87–5.11)
RDW: 12.2 % (ref 11.5–15.5)
WBC: 3.8 10*3/uL — ABNORMAL LOW (ref 4.0–10.5)
nRBC: 0 % (ref 0.0–0.2)

## 2021-02-10 LAB — SODIUM
Sodium: 121 mmol/L — ABNORMAL LOW (ref 135–145)
Sodium: 127 mmol/L — ABNORMAL LOW (ref 135–145)
Sodium: 129 mmol/L — ABNORMAL LOW (ref 135–145)
Sodium: 128 mmol/L — ABNORMAL LOW (ref 135–145)
Sodium: 126 mmol/L — ABNORMAL LOW (ref 135–145)

## 2021-02-10 LAB — SARS CORONAVIRUS 2 (TAT 6-24 HRS): SARS Coronavirus 2: POSITIVE — AB

## 2021-02-10 LAB — C-REACTIVE PROTEIN: CRP: 0.6 mg/dL (ref <1.0)

## 2021-02-10 LAB — D-DIMER, QUANTITATIVE: D-Dimer, Quant: 1.29 ug/mL-FEU — ABNORMAL HIGH (ref 0.00–0.50)

## 2021-02-10 LAB — PHOSPHORUS: Phosphorus: 3.1 mg/dL (ref 2.5–4.6)

## 2021-02-10 LAB — PROCALCITONIN: Procalcitonin: <0.1 ng/mL

## 2021-02-10 LAB — OSMOLALITY: Osmolality: 252 mOsm/kg — ABNORMAL LOW (ref 275–295)

## 2021-02-10 LAB — POTASSIUM: Potassium: 3.9 mmol/L (ref 3.5–5.1)

## 2021-02-10 LAB — OSMOLALITY, URINE: Osmolality, Ur: 152 mOsm/kg — ABNORMAL LOW (ref 300–900)

## 2021-02-10 MED ORDER — ASPIRIN EC 81 MG PO TBEC
81.0000 mg | DELAYED_RELEASE_TABLET | Freq: Every day | ORAL | Status: DC
  Administered 2021-02-10 – 2021-02-20 (×10): 81 mg via ORAL
  Filled 2021-02-10 (×11): qty 1

## 2021-02-10 MED ORDER — ACETAMINOPHEN 325 MG PO TABS: 650.0000 mg | ORAL_TABLET | Freq: Four times a day (QID) | ORAL | Status: DC | PRN

## 2021-02-10 MED ORDER — CALCIUM GLUCONATE 500 MG PO TABS
1.0000 | ORAL_TABLET | Freq: Three times a day (TID) | ORAL | Status: DC
  Filled 2021-02-10: qty 1

## 2021-02-10 MED ORDER — ATORVASTATIN CALCIUM 80 MG PO TABS
80.0000 mg | ORAL_TABLET | Freq: Every day | ORAL | Status: DC
  Administered 2021-02-10 – 2021-02-19 (×10): 80 mg via ORAL
  Filled 2021-02-10 (×11): qty 1

## 2021-02-10 MED ORDER — MELATONIN 3 MG PO TABS
3.0000 mg | ORAL_TABLET | Freq: Every evening | ORAL | Status: DC | PRN
  Administered 2021-02-14 – 2021-02-16 (×3): 3 mg via ORAL
  Filled 2021-02-10 (×4): qty 1

## 2021-02-10 MED ORDER — TICAGRELOR 90 MG PO TABS
90.0000 mg | ORAL_TABLET | Freq: Two times a day (BID) | ORAL | Status: DC
  Administered 2021-02-10 – 2021-02-20 (×22): 90 mg via ORAL
  Filled 2021-02-10 (×23): qty 1

## 2021-02-10 MED ORDER — SPIRONOLACTONE 12.5 MG HALF TABLET
12.5000 mg | ORAL_TABLET | Freq: Every day | ORAL | Status: DC
  Administered 2021-02-10 – 2021-02-11 (×2): 12.5 mg via ORAL
  Filled 2021-02-10 (×3): qty 1

## 2021-02-10 MED ORDER — SODIUM CHLORIDE 0.9 % IV SOLN: INTRAVENOUS | Status: DC

## 2021-02-10 MED ORDER — CALCIUM CARBONATE 1250 (500 CA) MG PO TABS
1.0000 | ORAL_TABLET | Freq: Three times a day (TID) | ORAL | Status: AC
  Administered 2021-02-10 (×3): 500 mg via ORAL
  Filled 2021-02-10 (×3): qty 1

## 2021-02-10 MED ORDER — PROCHLORPERAZINE EDISYLATE 10 MG/2ML IJ SOLN
10.0000 mg | Freq: Four times a day (QID) | INTRAMUSCULAR | Status: DC | PRN
  Administered 2021-02-13: 10 mg via INTRAVENOUS
  Filled 2021-02-10 (×2): qty 2

## 2021-02-10 MED ORDER — METOPROLOL SUCCINATE ER 25 MG PO TB24
12.5000 mg | ORAL_TABLET | Freq: Every day | ORAL | Status: DC
  Administered 2021-02-10 – 2021-02-12 (×3): 12.5 mg via ORAL
  Filled 2021-02-10 (×3): qty 1

## 2021-02-10 MED ORDER — SODIUM CHLORIDE 0.9 % IV SOLN
100.0000 mg | Freq: Every day | INTRAVENOUS | Status: AC
  Administered 2021-02-11 – 2021-02-12 (×2): 100 mg via INTRAVENOUS
  Filled 2021-02-10 (×2): qty 20

## 2021-02-10 MED ORDER — POTASSIUM CHLORIDE 10 MEQ/100ML IV SOLN
10.0000 meq | INTRAVENOUS | Status: AC
  Administered 2021-02-10 – 2021-02-11 (×3): 10 meq via INTRAVENOUS
  Filled 2021-02-10 (×5): qty 100

## 2021-02-10 MED ORDER — NITROGLYCERIN 0.4 MG SL SUBL: 0.4000 mg | SUBLINGUAL_TABLET | SUBLINGUAL | Status: DC | PRN

## 2021-02-10 MED ORDER — POTASSIUM CHLORIDE CRYS ER 20 MEQ PO TBCR
40.0000 meq | EXTENDED_RELEASE_TABLET | Freq: Two times a day (BID) | ORAL | Status: AC
  Administered 2021-02-10 (×2): 40 meq via ORAL
  Filled 2021-02-10 (×2): qty 2

## 2021-02-10 MED ORDER — SODIUM CHLORIDE 0.9 % IV SOLN
200.0000 mg | Freq: Once | INTRAVENOUS | Status: AC
  Administered 2021-02-10: 200 mg via INTRAVENOUS
  Filled 2021-02-10: qty 40

## 2021-02-10 NOTE — ED Notes (Signed)
Attempted to give report x2 

## 2021-02-10 NOTE — Telephone Encounter (Signed)
Pt insurance is active and benefits verified through Medicaid. Co-pay $0.00, DED $0.00/$0.00 met, out of pocket $0.00/$0.00 met, co-insurance 0%. No pre-authorization required. Passport, 02/10/21 @ 9:01AM, ZES#[id number]  Will contact patient to see if she is interested in the Cardiac Rehab Program. If interested, patient will need to complete follow up appt. Once completed, patient will be contacted for scheduling upon review by the RN Navigator.

## 2021-02-10 NOTE — Telephone Encounter (Signed)
Pt currently in the hospital.

## 2021-02-10 NOTE — Progress Notes (Signed)
Occupational Therapy Discharge Patient Details Name: ZORIANA OATS MRN: [id number] DOB: 1956/09/18 Today's Date: 02/10/2021 Time:  -     Patient discharged from OT services secondary to pt is toileting mod i in room at this time on isolation. NO acute needs after talking to PT and RN about currently admission course. will defer to home health.  Please see latest therapy progress note for current level of functioning and progress toward goals.    Progress and discharge plan discussed with patient and/or caregiver: Patient/Caregiver agrees with plan  GO     Jeri Modena 02/10/2021, 1:02 PM

## 2021-02-10 NOTE — Evaluation (Signed)
Physical Therapy Evaluation Patient Details Name: Colleen Fleming MRN: [id number] DOB: November 05, 1955 Today's Date: 02/10/2021   History of Present Illness  Pt is a 65 y.o. F who presents with acute onset of diarrhea and resulting hyponatremia, hypokalemia, hypocalcemia. Found to be covid+. Recently admitted for ST elevation MI, CAD s/p PCI. Significant PMH: HFrEF 25%, tobacco use, HLD, COPD, lung CA.  Clinical Impression  Pt admitted with above. Presents with generalized weakness, deconditioning, decreased activity tolerance. Ambulating to bathroom and back with a walker and min guard assist. Desaturation to 77% on RA (poor waveform) and once returned to sitting, SpO2 96%. Further distance deferred in setting of pt reporting fatigue and general malaise. Will continue to follow acutely to promote mobility as tolerated.     Follow Up Recommendations Home health PT    Equipment Recommendations  None recommended by PT    Recommendations for Other Services       Precautions / Restrictions Precautions Precautions: Fall;Other (comment) Precaution Comments: watch O2, covid+ Restrictions Weight Bearing Restrictions: No      Mobility  Bed Mobility Overal bed mobility: Modified Independent                  Transfers Overall transfer level: Needs assistance Equipment used: Rolling walker (2 wheeled) Transfers: Sit to/from Stand Sit to Stand: Min guard            Ambulation/Gait Ambulation/Gait assistance: Min guard Gait Distance (Feet): 25 Feet Assistive device: Rolling walker (2 wheeled) Gait Pattern/deviations: Step-through pattern;Decreased stride length     General Gait Details: Min guard assist, no overt LOB noted, slower speed  Stairs            Wheelchair Mobility    Modified Rankin (Stroke Patients Only)       Balance Overall balance assessment: Needs assistance Sitting-balance support: No upper extremity supported;Feet supported Sitting  balance-Leahy Scale: Good     Standing balance support: No upper extremity supported;During functional activity Standing balance-Leahy Scale: Fair                               Pertinent Vitals/Pain Pain Assessment: Faces Faces Pain Scale: Hurts a little bit Pain Location: general malaise Pain Intervention(s): Monitored during session    Home Living Family/patient expects to be discharged to:: Private residence Living Arrangements: Children Available Help at Discharge: Family;Available 24 hours/day Type of Home: House Home Access: Level entry     Home Layout: One level Home Equipment: Cane - single point;Walker - 2 wheels      Prior Function Level of Independence: Independent with assistive device(s)         Comments: using walker v cane     Hand Dominance        Extremity/Trunk Assessment   Upper Extremity Assessment Upper Extremity Assessment: Overall WFL for tasks assessed    Lower Extremity Assessment Lower Extremity Assessment: Overall WFL for tasks assessed       Communication   Communication: HOH  Cognition Arousal/Alertness: Awake/alert Behavior During Therapy: WFL for tasks assessed/performed Overall Cognitive Status: Within Functional Limits for tasks assessed                                 General Comments: WFL for basic mobility, Internally distracted by malaise, fatigue      General Comments      Exercises  Assessment/Plan    PT Assessment Patient needs continued PT services  PT Problem List Decreased balance;Decreased mobility       PT Treatment Interventions DME instruction;Gait training;Functional mobility training;Therapeutic activities;Therapeutic exercise;Balance training    PT Goals (Current goals can be found in the Care Plan section)  Acute Rehab PT Goals Potential to Achieve Goals: Good    Frequency Min 3X/week   Barriers to discharge        Co-evaluation                AM-PAC PT "6 Clicks" Mobility  Outcome Measure Help needed turning from your back to your side while in a flat bed without using bedrails?: None Help needed moving from lying on your back to sitting on the side of a flat bed without using bedrails?: None Help needed moving to and from a bed to a chair (including a wheelchair)?: A Little Help needed standing up from a chair using your arms (e.g., wheelchair or bedside chair)?: A Little Help needed to walk in hospital room?: A Little Help needed climbing 3-5 steps with a railing? : A Little 6 Click Score: 20    End of Session   Activity Tolerance: Patient tolerated treatment well Patient left: in bed;with call bell/phone within reach;with bed alarm set Nurse Communication: Mobility status PT Visit Diagnosis: Unsteadiness on feet (R26.81);Other abnormalities of gait and mobility (R26.89)    Time: 3244-0102 PT Time Calculation (min) (ACUTE ONLY): 33 min   Charges:   PT Evaluation $PT Eval Moderate Complexity: 1 Mod PT Treatments $Therapeutic Activity: 8-22 mins        Wyona Almas, PT, DPT Acute Rehabilitation Services Pager [phone] Office [phone]   Deno Etienne 02/10/2021, 1:10 PM

## 2021-02-10 NOTE — ED Provider Notes (Signed)
[facility] EMERGENCY DEPARTMENT Provider Note   CSN: [id number] Arrival date & time: 02/09/21  2229     History Chief Complaint  Patient presents with  . Weakness    Colleen Fleming is a 65 y.o. female.  The history is provided by the patient.  Weakness Severity:  Moderate Onset quality:  Gradual Duration:  1 day Timing:  Intermittent Progression:  Worsening Chronicity:  New Relieved by:  Rest Worsened by:  Activity Associated symptoms: diarrhea and headaches   Associated symptoms: no abdominal pain, no chest pain, no fever, no hematochezia, no loss of consciousness and no vomiting   Patient with history of COPD, CAD with recent STEMI presents with generalized weakness and diarrhea.  Patient reports over the past day she has been having multiple episodes of nonbloody diarrhea.  No vomiting.  No abdominal pain.  She reports her family encouraged her to call EMS to be evaluated.  EMS reported patient was mildly hypotensive and was given IV fluids prior to arrival No chest pain or shortness of breath is reported    Past Medical History:  Diagnosis Date  . Anxiety and depression   . COPD (chronic obstructive pulmonary disease) (HCC)    Long-term smoker greater than 35 years 1 to 2 pack a day.  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 GERD (gastroesophageal reflux disease)   . History of seizure disorder   . Hyperlipidemia   . Obesity   . Small cell lung cancer, left upper lobe ([place]) 2014   Treated with radiation and chemotherapy [facility])    Patient Active Problem List   Diagnosis Date Noted  . Hyponatremia 02/10/2021  . STEMI (ST elevation myocardial infarction) ([place]) 01/30/2021  . Acute ST elevation myocardial infarction (STEMI) involving left anterior descending (LAD) coronary artery ([place])   . Hypokalemia   . Panlobular emphysema ([place])   . Coronary artery disease involving native coronary artery of native  heart with unstable angina pectoris ([place])   . GERD (gastroesophageal reflux disease) 11/25/2011  . Nicotine abuse 11/25/2011  . Hyperlipemia 11/25/2011  . IBS (irritable bowel syndrome) 11/25/2011  . Anxiety as acute reaction to exceptional stress 11/25/2011    Past Surgical History:  Procedure Laterality Date  . CORONARY/GRAFT ACUTE MI REVASCULARIZATION N/A 01/30/2021   Procedure: Coronary/Graft Acute MI Revascularization;  Surgeon: Leonie Man, MD;  Location: [place] CV LAB;  Service: Cardiovascular;  Laterality: N/A;  . LEFT HEART CATH AND CORONARY ANGIOGRAPHY N/A 01/30/2021   Procedure: LEFT HEART CATH AND CORONARY ANGIOGRAPHY;  Surgeon: Leonie Man, MD;  Location: [place] CV LAB;  Service: Cardiovascular;  Laterality: N/A;     OB History   No obstetric history on file.     Family History  Problem Relation Age of Onset  . Cancer Paternal Grandmother   . Cancer Paternal Aunt     Social History   Tobacco Use  . Smoking status: Current Every Day Smoker    Packs/day: 1.50    Years: 40.00    Pack years: 60.00  . Smokeless tobacco: Never Used  Substance Use Topics  . Alcohol use: Not Currently  . Drug use: Never    Home Medications Prior to Admission medications   Medication Sig Start Date End Date Taking? Authorizing Provider  aspirin 81 MG EC tablet Take 1 tablet (81 mg total) by mouth daily. Swallow whole. 02/05/21   Consuelo Pandy, [name]  atorvastatin (LIPITOR) 80 MG tablet Take 1 tablet (80 mg total) by mouth daily. 02/05/21   Lyda Jester M, [name]  metoprolol succinate (TOPROL-XL) 25 MG 24 hr tablet Take 0.5 tablets (12.5 mg total) by mouth daily. 02/05/21   Lyda Jester M, [name]  nitroGLYCERIN (NITROSTAT) 0.4 MG SL tablet Place 1 tablet (0.4 mg total) under the tongue every 5 (five) minutes x 3 doses as needed for chest pain. 02/04/21   Consuelo Pandy, [name]  spironolactone (ALDACTONE) 25 MG tablet Take 0.5 tablets (12.5 mg total) by  mouth daily. 02/04/21   Consuelo Pandy, [name]  ticagrelor (BRILINTA) 90 MG TABS tablet Take 1 tablet (90 mg total) by mouth 2 (two) times daily. 02/04/21   Consuelo Pandy, [name]    Allergies    Penicillins  Review of Systems   Review of Systems  Constitutional: Negative for fever.  Cardiovascular: Negative for chest pain.  Gastrointestinal: Positive for diarrhea. Negative for abdominal pain, hematochezia and vomiting.  Neurological: Positive for weakness and headaches. Negative for loss of consciousness.  All other systems reviewed and are negative.   Physical Exam Updated Vital Signs BP 109/68   Pulse 80   Temp 97.8 F (36.6 C) (Oral)   Resp 16   SpO2 100%   Physical Exam CONSTITUTIONAL: Elderly and frail HEAD: Normocephalic/atraumatic EYES: EOMI/PERRL ENMT: Mucous membranes dry NECK: supple no meningeal signs SPINE/BACK:entire spine nontender CV: S1/S2 noted, no murmurs/rubs/gallops noted LUNGS: Lungs are clear to auscultation bilaterally, no apparent distress ABDOMEN: soft, nontender, no rebound or guarding, bowel sounds noted throughout abdomen GU:no cva tenderness NEURO: Pt is awake/alert/appropriate, moves all extremitiesx4.  No facial droop.  No arm or leg drift. EXTREMITIES: pulses normal/equal, full ROM SKIN: warm, color normal PSYCH: no abnormalities of mood noted, alert and oriented to situation  ED Results / Procedures / Treatments   Labs (all labs ordered are listed, but only abnormal results are displayed) Labs Reviewed  BASIC METABOLIC PANEL - Abnormal; Notable for the following components:      Result Value   Sodium 117 (*)    Potassium 2.8 (*)    Chloride 87 (*)    CO2 20 (*)    Glucose, Bld 134 (*)    BUN 5 (*)    Calcium 7.7 (*)    All other components within normal limits  CBC - Abnormal; Notable for the following components:   RBC 3.58 (*)    Hemoglobin 11.5 (*)    HCT 32.7 (*)    All other components within normal limits  SARS  CORONAVIRUS 2 (TAT 6-24 HRS)  URINALYSIS, ROUTINE W REFLEX MICROSCOPIC  TSH  OSMOLALITY  SODIUM, URINE, RANDOM  OSMOLALITY, URINE  SODIUM  SODIUM  SODIUM  SODIUM  SODIUM  MAGNESIUM  CBC  COMPREHENSIVE METABOLIC PANEL  MAGNESIUM  PHOSPHORUS    EKG EKG Interpretation  Date/Time:  Tuesday Feb 09 2021 22:40:29 EDT Ventricular Rate:  80 PR Interval:  174 QRS Duration: 76 QT Interval:  424 QTC Calculation: 489 R Axis:   94 Text Interpretation: Normal sinus rhythm Anterolateral infarct , age undetermined Abnormal ECG Confirmed by Ripley Fraise [phone]) on 02/09/2021 11:11:28 PM   Radiology DG Chest Port 1 View  Result Date: 02/09/2021 CLINICAL DATA:  Weakness, history of recent STEMI EXAM: PORTABLE CHEST 1 VIEW COMPARISON:  01/31/2021 FINDINGS: Cardiac shadow is at the upper limits of normal in size. Prior right jugular central line has been removed in the interval. The lungs are well aerated  without focal infiltrate or sizable effusion. Chronic band like density is noted in the left upper lobe medially consistent with prior radiation therapy. This is stable from a prior CT from 12/23/2020 IMPRESSION: No acute abnormality noted. Prior radiation change in the medial left upper lobe. Electronically Signed   By: Inez Catalina M.D.   On: 02/09/2021 23:34    Procedures .Critical Care Performed by: Ripley Fraise, MD Authorized by: Ripley Fraise, MD   Critical care provider statement:    Critical care time (minutes):  45   Critical care start time:  02/09/2021 11:15 PM   Critical care end time:  02/10/2021 12:00 AM   Critical care time was exclusive of:  Separately billable procedures and treating other patients   Critical care was necessary to treat or prevent imminent or life-threatening deterioration of the following conditions:  Metabolic crisis and dehydration   Critical care was time spent personally by me on the following activities:  Review of old charts, re-evaluation  of patient's condition, pulse oximetry, ordering and review of laboratory studies, examination of patient, ordering and review of radiographic studies, development of treatment plan with patient or surrogate, discussions with consultants and evaluation of patient's response to treatment   I assumed direction of critical care for this patient from another provider in my specialty: no     Care discussed with: admitting provider       Medications Ordered in ED Medications  lactated ringers bolus 500 mL (500 mLs Intravenous New Bag/Given 02/09/21 2336)    ED Course  I have reviewed the triage vital signs and the nursing notes.  Pertinent labs & imaging results that were available during my care of the patient were reviewed by me and considered in my medical decision making (see chart for details).    MDM Rules/Calculators/A&P                         12:46 AM Patient with recent admission for STEMI, now presenting with diarrhea and generalized weakness.  Patient had no vomiting or abdominal pain.  She denies any chest pain.  Patient's found to have significant dehydration, and hyponatremia.  Patient also noted to have hypokalemia Patient be admitted for rehydration and correction of hyponatremia.  Discussed with Dr. Nevada Crane with [facility] for admission Final Clinical Impression(s) / ED Diagnoses Final diagnoses:  Weakness  Hyponatremia    Rx / DC Orders ED Discharge Orders    None       Ripley Fraise, MD 02/10/21 [phone]

## 2021-02-10 NOTE — Progress Notes (Signed)
Briefly, patient is a 65 year old female with PMH significant for CAD s/p  recent STEMI with PCI of LAD on DAPT, HFrEF with EF of 25% who was discharged on 02/04/2021 was readmitted earlier this morning for profuse diarrhea for 1 to 2 days.  Patient states that she feels okay but came in primarily because her son said she was not looking well.  Valuation in the ER was notable for sodium of 117 with potassium of 2.8, museum 1.5 and creatinine of 0.77.  Patient was admitted with gentle hydration of normal saline at 50 cc an hour.  After admission, patient's COVID test came back positive.  Patient herself tells me she has no shortness of breath whatsoever.  No fevers or chills.  She does admit to some anorexia and general lethargy as well.  She is unvaccinated.  Signs are notable for improved blood pressure at 114/84 with 99% oxygenation on room air.  Diminished soft and benign.  Will hold normal saline at present as sodium is up to 129 today.  However since this is most likely acute secondary to intravascular volume depletion, there is much less concern for any intracranial pressure and fluid shift changes.  We started patient on remdesivir for 3 days.  Agree with continuation of other medications as per admission orders earlier today.

## 2021-02-10 NOTE — H&P (Addendum)
History and Physical  Colleen Fleming XHB:[id number] DOB: [birth year] DOA: 02/09/2021  Referring physician: Dr. Christy Gentles, [place]. PCP: No primary care provider on file.  Outpatient Specialists: Cardiology. Patient coming from: Home.  Chief Complaint: Diarrhea since this morning.  HPI: Colleen Fleming is a 65 y.o. female with medical history significant for recently admitted for ST elevation MI, coronary artery disease status post PCI of proximal LAD on DAPT and discharged on 05/03/174, acute systolic heart failure secondary to ischemic cardiomyopathy, HFrEF 25%, tobacco use disorder, hyperlipidemia, physical deconditioning who presented to [facility] ED due to acute onset of diarrhea since this morning.  She denies any associated nausea vomiting or abdominal cramping.  No reported fever chills sweats.  Her mentation is a little slow however she answers questions appropriately.  EMS was activated.  She was brought into the ED for further evaluation.  Work-up in the ED revealed electrolyte disturbances with serum sodium 117, potassium 2.8, serum bicarb 20, serum calcium 7.7, anion gap 10.  TRH, hospitalist team, was asked to admit.  ED Course: Afebrile.  BP 95/67, pulse 82, respiration rate 23, O2 saturation 98% on room air.  Lab studies remarkable for serum sodium 117, potassium 2.8, serum bicarb 20, serum calcium 7.7, anion gap 10.  WBC 4.1, hemoglobin 11.5, platelet count 282.  Review of Systems: Review of systems as noted in the HPI. All other systems reviewed and are negative.   Past Medical History:  Diagnosis Date  . Anxiety and depression   . COPD (chronic obstructive pulmonary disease) (HCC)    Long-term smoker greater than 35 years 1 to 2 pack a day.  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 GERD (gastroesophageal reflux disease)   . History of seizure disorder   . Hyperlipidemia   . Obesity   . Small cell lung cancer, left upper lobe ([place]) 2014   Treated with  radiation and chemotherapy [facility])   Past Surgical History:  Procedure Laterality Date  . CORONARY/GRAFT ACUTE MI REVASCULARIZATION N/A 01/30/2021   Procedure: Coronary/Graft Acute MI Revascularization;  Surgeon: Leonie Man, MD;  Location: [place] CV LAB;  Service: Cardiovascular;  Laterality: N/A;  . LEFT HEART CATH AND CORONARY ANGIOGRAPHY N/A 01/30/2021   Procedure: LEFT HEART CATH AND CORONARY ANGIOGRAPHY;  Surgeon: Leonie Man, MD;  Location: [place] CV LAB;  Service: Cardiovascular;  Laterality: N/A;    Social History:  reports that she has been smoking. She has a 60.00 pack-year smoking history. She has never used smokeless tobacco. She reports previous alcohol use. She reports that she does not use drugs.   Allergies  Allergen Reactions  . Penicillins Hives    Family History  Problem Relation Age of Onset  . Cancer Paternal Grandmother   . Cancer Paternal Aunt       Prior to Admission medications   Medication Sig Start Date End Date Taking? Authorizing Provider  aspirin 81 MG EC tablet Take 1 tablet (81 mg total) by mouth daily. Swallow whole. 02/05/21   Lyda Jester M, [name]  atorvastatin (LIPITOR) 80 MG tablet Take 1 tablet (80 mg total) by mouth daily. 02/05/21   Lyda Jester M, [name]  metoprolol succinate (TOPROL-XL) 25 MG 24 hr tablet Take 0.5 tablets (12.5 mg total) by mouth daily. 02/05/21   Lyda Jester M, [name]  nitroGLYCERIN (NITROSTAT) 0.4 MG SL tablet Place 1 tablet (0.4 mg total) under the tongue every 5 (five) minutes x  3 doses as needed for chest pain. 02/04/21   Consuelo Pandy, [name]  spironolactone (ALDACTONE) 25 MG tablet Take 0.5 tablets (12.5 mg total) by mouth daily. 02/04/21   Consuelo Pandy, [name]  ticagrelor (BRILINTA) 90 MG TABS tablet Take 1 tablet (90 mg total) by mouth 2 (two) times daily. 02/04/21   Consuelo Pandy, [name]    Physical Exam: BP 109/68   Pulse 80   Temp 97.8 F (36.6 C) (Oral)   Resp  16   SpO2 100%   . General: 65 y.o. year-old female frail-appearing in no acute distress.  Alert and oriented x3. . Cardiovascular: Regular rate and rhythm with no rubs or gallops.  No thyromegaly or JVD noted.  No lower extremity edema. 2/4 pulses in all 4 extremities. Marland Kitchen Respiratory: Clear to auscultation with no wheezes or rales. Good inspiratory effort. . Abdomen: Soft nontender nondistended with normal bowel sounds x4 quadrants. . Muskuloskeletal: No cyanosis, clubbing or edema noted bilaterally . Neuro: CN II-XII intact, strength, sensation, reflexes . Skin: No ulcerative lesions noted or rashes . Psychiatry: Judgement and insight appear normal. Mood is appropriate for condition and setting          Labs on Admission:  Basic Metabolic Panel: Recent Labs  Lab 02/03/21 0500 02/03/21 1122 02/04/21 0348 02/09/21 2248  NA  --  131* 133* 117*  K  --  3.8 4.4 2.8*  CL  --  103 103 87*  CO2  --  23 21* 20*  GLUCOSE  --  111* 107* 134*  BUN  --  <5* 5* 5*  CREATININE  --  0.71 0.72 0.77  CALCIUM  --  8.3* 8.2* 7.7*  MG 1.7  --  2.2  --    Liver Function Tests: No results for input(s): AST, ALT, ALKPHOS, BILITOT, PROT, ALBUMIN in the last 168 hours. No results for input(s): LIPASE, AMYLASE in the last 168 hours. No results for input(s): AMMONIA in the last 168 hours. CBC: Recent Labs  Lab 02/09/21 2248  WBC 4.1  HGB 11.5*  HCT 32.7*  MCV 91.3  PLT 282   Cardiac Enzymes: No results for input(s): CKTOTAL, CKMB, CKMBINDEX, TROPONINI in the last 168 hours.  BNP (last 3 results) Recent Labs    02/01/21 1100  BNP 792.9*    ProBNP (last 3 results) No results for input(s): PROBNP in the last 8760 hours.  CBG: No results for input(s): GLUCAP in the last 168 hours.  Radiological Exams on Admission: DG Chest Port 1 View  Result Date: 02/09/2021 CLINICAL DATA:  Weakness, history of recent STEMI EXAM: PORTABLE CHEST 1 VIEW COMPARISON:  01/31/2021 FINDINGS: Cardiac  shadow is at the upper limits of normal in size. Prior right jugular central line has been removed in the interval. The lungs are well aerated without focal infiltrate or sizable effusion. Chronic band like density is noted in the left upper lobe medially consistent with prior radiation therapy. This is stable from a prior CT from 12/23/2020 IMPRESSION: No acute abnormality noted. Prior radiation change in the medial left upper lobe. Electronically Signed   By: Inez Catalina M.D.   On: 02/09/2021 23:34    EKG: I independently viewed the EKG done and my findings are as followed: Sinus rhythm rate of 80.  Nonspecific ST-T changes.  QTc 489.  Assessment/Plan Present on Admission: . Hyponatremia  Active Problems:   Hyponatremia  Hypovolemic hyponatremia in the setting of acute diarrhea Sudden onset diarrhea that started the  morning of 02/09/2021. Presented with serum sodium 117, serum potassium 2.8 Continue IV fluid hydration normal saline at 50 cc/h x 2 days. Avoid rapid correction of hyponatremia no more than 8 mEq x 24-hour. Serum sodium every 4 hours. Obtain urine osmolality, urine sodium, serum osmolality Obtain TSH to rule out other causes of hyponatremia  Acute diarrhea, unclear etiology Rule out active infective process Obtain C. difficile PCR and GI panel  Hypokalemia likely from GI losses with diarrhea. Serum potassium 2.8, repleted orally. Repeat serum potassium level in the morning.  Hypocalcemia Serum calcium 7.7 Obtain albumin level frorm CMP in the morning to calculate calcium correction for albumin P.o. calcium gluconate 500 mg 3 times daily x3 doses.  Mild normal anion gap metabolic acidosis Serum bicarb 20 anion gap 10 Continue IV fluid hydration.  Recent STEMI/coronary artery disease status post PCI with stent to the proximal LAD She denies any anginal symptoms at the time of this visit. Resume home DAPT Resume home Lipitor  HFrEF 25%/ischemic  cardiomyopathy Resume home regimen Strict I's and O's and daily weight Closely monitor volume status while on IV fluid  Chronic normocytic anemia She appears to be at her baseline hemoglobin 11.5 with MCV of 91. No overt bleeding Continue to monitor.  Physical deconditioning PT OT to assess Fall precautions    DVT prophylaxis: SCDs.  Code Status: Full code  Family Communication: None at bedside.  Disposition Plan: Admit to progressive unit.  Consults called: None.  Admission status: Inpatient status.  Patient will require at least 2 midnights for further evaluation and treatment of present condition.   Status is: Inpatient    Dispo:  Patient From: Home  Planned Disposition: Home, possibly on 02/12/2021 or when symptomatology has improved.  Medically stable for discharge: No         Kayleen Memos MD [facility] Pager [phone]  If 7PM-7AM, please contact night-coverage [email or web address] Password [facility]  02/10/2021, 12:38 AM

## 2021-02-10 NOTE — Progress Notes (Signed)
Patient arrived to unit via ED RN. Patient is A&Ox4, vitals are WNL. Completed focused assessment.    Personal Items: Purse, clothes, shoes and cell phone at bedside.   Patient oriented to room, call bell in reach and bed in lowest position. As well as placed patient on enteric precautions per order.

## 2021-02-11 DIAGNOSIS — I5022 Chronic systolic (congestive) heart failure: Secondary | ICD-10-CM | POA: Diagnosis not present

## 2021-02-11 DIAGNOSIS — I2510 Atherosclerotic heart disease of native coronary artery without angina pectoris: Secondary | ICD-10-CM

## 2021-02-11 DIAGNOSIS — E871 Hypo-osmolality and hyponatremia: Secondary | ICD-10-CM | POA: Diagnosis not present

## 2021-02-11 DIAGNOSIS — U071 COVID-19: Secondary | ICD-10-CM | POA: Diagnosis not present

## 2021-02-11 LAB — COMPREHENSIVE METABOLIC PANEL
ALT: 24 U/L (ref 0–44)
AST: 27 U/L (ref 15–41)
Albumin: 3.2 g/dL — ABNORMAL LOW (ref 3.5–5.0)
Alkaline Phosphatase: 81 U/L (ref 38–126)
Anion gap: 6 (ref 5–15)
BUN: <5 mg/dL — ABNORMAL LOW (ref 8–23)
CO2: 21 mmol/L — ABNORMAL LOW (ref 22–32)
Calcium: 8.4 mg/dL — ABNORMAL LOW (ref 8.9–10.3)
Chloride: 98 mmol/L (ref 98–111)
Creatinine, Ser: 0.78 mg/dL (ref 0.44–1.00)
GFR, Estimated: >60 mL/min (ref >60)
Glucose, Bld: 92 mg/dL (ref 70–99)
Potassium: 4.7 mmol/L (ref 3.5–5.1)
Sodium: 125 mmol/L — ABNORMAL LOW (ref 135–145)
Total Bilirubin: 0.4 mg/dL (ref 0.3–1.2)
Total Protein: 5.9 g/dL — ABNORMAL LOW (ref 6.5–8.1)

## 2021-02-11 LAB — C-REACTIVE PROTEIN: CRP: 0.7 mg/dL (ref <1.0)

## 2021-02-11 LAB — CBC WITH DIFFERENTIAL/PLATELET
Abs Immature Granulocytes: 0.03 10*3/uL (ref 0.00–0.07)
Basophils Absolute: 0 10*3/uL (ref 0.0–0.1)
Basophils Relative: 0 %
Eosinophils Absolute: 0 10*3/uL (ref 0.0–0.5)
Eosinophils Relative: 0 %
HCT: 35 % — ABNORMAL LOW (ref 36.0–46.0)
Hemoglobin: 12.4 g/dL (ref 12.0–15.0)
Immature Granulocytes: 0 %
Lymphocytes Relative: 12 %
Lymphs Abs: 0.9 10*3/uL (ref 0.7–4.0)
MCH: 32.3 pg (ref 26.0–34.0)
MCHC: 35.4 g/dL (ref 30.0–36.0)
MCV: 91.1 fL (ref 80.0–100.0)
Monocytes Absolute: 0.7 10*3/uL (ref 0.1–1.0)
Monocytes Relative: 10 %
Neutro Abs: 5.7 10*3/uL (ref 1.7–7.7)
Neutrophils Relative %: 78 %
Platelets: 279 10*3/uL (ref 150–400)
RBC: 3.84 MIL/uL — ABNORMAL LOW (ref 3.87–5.11)
RDW: 12.8 % (ref 11.5–15.5)
WBC: 7.4 10*3/uL (ref 4.0–10.5)
nRBC: 0 % (ref 0.0–0.2)

## 2021-02-11 LAB — SODIUM: Sodium: 126 mmol/L — ABNORMAL LOW (ref 135–145)

## 2021-02-11 MED ORDER — ENOXAPARIN SODIUM 300 MG/3ML IJ SOLN
20.0000 mg | INTRAMUSCULAR | Status: DC
  Administered 2021-02-11 – 2021-02-19 (×7): 20 mg via SUBCUTANEOUS
  Filled 2021-02-11 (×10): qty 0.2

## 2021-02-11 MED ORDER — MAGNESIUM SULFATE 2 GM/50ML IV SOLN
2.0000 g | Freq: Once | INTRAVENOUS | Status: AC
  Administered 2021-02-11: 2 g via INTRAVENOUS
  Filled 2021-02-11: qty 50

## 2021-02-11 NOTE — Progress Notes (Signed)
   02/11/21 1922  Assess: MEWS Score  Temp 98.3 F (36.8 C)  BP (!) 71/58  Pulse Rate 74  ECG Heart Rate 74  Resp 19  Level of Consciousness Alert  SpO2 97 %  O2 Device Room Air  Assess: MEWS Score  MEWS Temp 0  MEWS Systolic 2  MEWS Pulse 0  MEWS RR 0  MEWS LOC 0  MEWS Score 2  MEWS Score Color Yellow  Assess: if the MEWS score is Yellow or Red  Were vital signs taken at a resting state? Yes  Focused Assessment No change from prior assessment  Early Detection of Sepsis Score *See Row Information* Low  MEWS guidelines implemented *See Row Information* Yes  Take Vital Signs  Increase Vital Sign Frequency  Yellow: Q 2hr X 2 then Q 4hr X 2, if remains yellow, continue Q 4hrs  Notify: Charge Nurse/RN  Name of Charge Nurse/RN Notified Daja, RN  Date Charge Nurse/RN Notified 02/11/21  Time Charge Nurse/RN Notified 1946  Document  Patient Outcome Other (Comment) (no intervention at this time)  Progress note created (see row info) Yes  Assess: SIRS CRITERIA  SIRS Temperature  0  SIRS Pulse 0  SIRS Respirations  0  SIRS WBC 0  SIRS Score Sum  0

## 2021-02-11 NOTE — Progress Notes (Signed)
PROGRESS NOTE                                                                                                                                                                                                             Patient Demographics:    Colleen Fleming, is a 65 y.o. female, DOB - [birth year], KWI:[id number]  Outpatient Primary MD for the patient is Colleen Fleming, [place]   Admit date - 02/09/2021   LOS - 1  Chief Complaint  Patient presents with  . Weakness       Brief Narrative: Patient is a 65 y.o. female with PMHx of CAD-recent PCI, chronic systolic heart failure-presented with diarrhea-found to have severe hyponatremia.  Found to have COVID-19 infection.  COVID-19 vaccinated status:  Significant Events: 5/10>> Admit to [facility] for diarrhea-vit D-found to have hyponatremia-sodium 117.  Significant studies: 5/10>>Chest x-ray: No pneumonia  COVID-19 medications: Remdesivir:5/11>>  Antibiotics: None  Microbiology data: None  Procedures: None  Consults: None  DVT prophylaxis: Place and maintain sequential compression device Start: 02/10/21 0202    Subjective:    Colleen Fleming today feels better-claims her diarrhea has slowed down quite a lot.  She did not have any BM overnight per patient   Assessment  & Plan :   Hyponatremia: Due to hypovolemia in a setting of diarrhea-improving with IV fluids.   Hypokalemia: Due to GI loss-diuretic use-resolved  Diarrhea: Likely from COVID 19 infection-improved-Per patient-no BM overnight-stool studies not sent due to lack of BM.  Watch closely-if diarrhea reoccurs-May be worth sending out stool studies.  COVID-19 infection: Likely causing diarrhea-on Remdesivir x3 days.  Recent STEMI-s/p PCI: No anginal symptoms-on antiplatelets/statin/beta-blocker.  Chronic systolic heart failure (EF 25% on echo 5/2): Volume status stable  COVID-19 Labs: Recent Labs     02/10/21 1629  DDIMER 1.29*  LDH 225*  CRP 0.6       Component Value Date/Time   BNP 792.9 (H) 02/01/2021 1100    Recent Labs  Lab 02/10/21 1629  PROCALCITON <0.10    Lab Results  Component Value Date   SARSCOV2NAA POSITIVE (A) 02/10/2021   [place] NEGATIVE 01/30/2021     ABG:    Component Value Date/Time   TCO2 19 (L) 01/30/2021 1924   O2SAT 75.0 02/03/2021 0555    Vent Settings: N/A  Condition - Stable  Family  Communication  : Son-Colleen Fleming-[phone] updated over the phone on 5/12  Code Status :  Full Code  Diet :  Diet Order            Diet Heart Room service appropriate? Yes; Fluid consistency: Thin  Diet effective now                  Disposition Plan  :   Status is: Inpatient  Remains inpatient appropriate because:Inpatient level of care appropriate due to severity of illness   Dispo:  Patient From: Home  Planned Disposition: Home  Medically stable for discharge: No      Barriers to discharge: Hypoxia requiring O2 supplementation/complete 5 days of IV Remdesivir  Antimicorbials  :    Anti-infectives (From admission, onward)   Start     Dose/Rate Route Frequency Ordered Stop   02/11/21 1000  remdesivir 100 mg in sodium chloride 0.9 % 100 mL IVPB       "Followed by" Linked Group Details   100 mg 200 mL/hr over 30 Minutes Intravenous Daily 02/10/21 1410 02/13/21 0959   02/10/21 1515  remdesivir 200 mg in sodium chloride 0.9% 250 mL IVPB       "Followed by" Linked Group Details   200 mg 580 mL/hr over 30 Minutes Intravenous Once 02/10/21 1410 02/10/21 1733      Inpatient Medications  Scheduled Meds: . aspirin EC  81 mg Oral Daily  . atorvastatin  80 mg Oral Daily  . metoprolol succinate  12.5 mg Oral Daily  . spironolactone  12.5 mg Oral Daily  . ticagrelor  90 mg Oral BID   Continuous Infusions: . remdesivir 100 mg in NS 100 mL     PRN Meds:.acetaminophen, melatonin, nitroGLYCERIN, prochlorperazine   Time Spent in  minutes  35    See all Orders from today for further details   Oren Binet M.D on 02/11/2021 at 10:42 AM  To page go to [email or web address] - use universal password  [facility] -  Office  [phone]    Objective:   Vitals:   02/10/21 0827 02/10/21 0900 02/10/21 2333 02/11/21 0850  BP: 114/84 100/67 (!) 104/58 (!) 111/57  Pulse: 81 75 75 71  Resp: 20 20  (!) 21  Temp: 97.8 F (36.6 C) 98.7 F (37.1 C) 98.2 F (36.8 C) 98.9 F (37.2 C)  TempSrc: Oral Oral Oral Oral  SpO2: 99% 98% 95% 98%    Wt Readings from Last 3 Encounters:  02/04/21 43.4 kg  11/25/11 56.6 kg     Intake/Output Summary (Last 24 hours) at 02/11/2021 1042 Last data filed at 02/10/2021 2200 Gross per 24 hour  Intake 240 ml  Output --  Net 240 ml     Physical Exam Gen Exam:Alert awake-not in any distress HEENT:atraumatic, normocephalic Chest: B/L clear to auscultation anteriorly CVS:S1S2 regular Abdomen:soft non tender, non distended Extremities:no edema Neurology: Non focal Skin: no rash   Data Review:    CBC Recent Labs  Lab 02/09/21 2248 02/10/21 0226 02/11/21 0914  WBC 4.1 3.8* 7.4  HGB 11.5* 10.6* 12.4  HCT 32.7* 30.9* 35.0*  PLT 282 256 279  MCV 91.3 92.8 91.1  MCH 32.1 31.8 32.3  MCHC 35.2 34.3 35.4  RDW 12.1 12.2 12.8  LYMPHSABS  --   --  PENDING  MONOABS  --   --  PENDING  EOSABS  --   --  PENDING  BASOSABS  --   --  PENDING    Chemistries  Recent  Labs  Lab 02/09/21 2248 02/10/21 0034 02/10/21 0226 02/10/21 0900 02/10/21 1253 02/10/21 1629 02/10/21 2048 02/11/21 0102  NA 117* 121* 121* 127* 129* 128* 126* 126*  K 2.8*  --  3.1*  --   --  3.9  --   --   CL 87*  --  91*  --   --   --   --   --   CO2 20*  --  20*  --   --   --   --   --   GLUCOSE 134*  --  118*  --   --   --   --   --   BUN 5*  --  5*  --   --   --   --   --   CREATININE 0.77  --  0.76  --   --   --   --   --   CALCIUM 7.7*  --  7.9*  --   --   --   --   --   MG  --  1.6* 1.5*  --    --   --   --   --   AST  --   --  28  --   --   --   --   --   ALT  --   --  25  --   --   --   --   --   ALKPHOS  --   --  72  --   --   --   --   --   BILITOT  --   --  0.7  --   --   --   --   --    ------------------------------------------------------------------------------------------------------------------ No results for input(s): CHOL, HDL, LDLCALC, TRIG, CHOLHDL, LDLDIRECT in the last 72 hours.  Lab Results  Component Value Date   HGBA1C 5.4 01/31/2021   ------------------------------------------------------------------------------------------------------------------ Recent Labs    02/10/21 0031  TSH 1.140   ------------------------------------------------------------------------------------------------------------------ No results for input(s): VITAMINB12, FOLATE, FERRITIN, TIBC, IRON, RETICCTPCT in the last 72 hours.  Coagulation profile No results for input(s): INR, PROTIME in the last 168 hours.  Recent Labs    02/10/21 1629  DDIMER 1.29*    Cardiac Enzymes No results for input(s): CKMB, TROPONINI, MYOGLOBIN in the last 168 hours.  Invalid input(s): CK ------------------------------------------------------------------------------------------------------------------    Component Value Date/Time   BNP 792.9 (H) 02/01/2021 1100    Micro Results Recent Results (from the past 240 hour(s))  SARS CORONAVIRUS 2 (TAT 6-24 HRS) Nasopharyngeal Nasopharyngeal Swab     Status: Abnormal   Collection Time: 02/10/21 12:18 AM   Specimen: Nasopharyngeal Swab  Result Value Ref Range Status   SARS Coronavirus 2 POSITIVE (A) NEGATIVE Final    Comment: (NOTE) SARS-CoV-2 target nucleic acids are DETECTED.  The SARS-CoV-2 RNA is generally detectable in upper and lower respiratory specimens during the acute phase of infection. Positive results are indicative of the presence of SARS-CoV-2 RNA. Clinical correlation with patient history and other diagnostic information is   necessary to determine patient infection status. Positive results do not rule out bacterial infection or co-infection with other viruses.  The expected result is Negative.  Fact Sheet for Patients: SugarRoll.be  Fact Sheet for Healthcare Providers: https://www.woods-mathews.com/  This test is not yet approved or cleared by the [place] FDA and  has been authorized for detection and/or diagnosis of SARS-CoV-2 by FDA under an Emergency Use Authorization (EUA). This  EUA will remain  in effect (meaning this test can be used) for the duration of the COVID-19 declaration under Section 564(b)(1) of the Act, 21 U. S.C. section 360bbb-3(b)(1), unless the authorization is terminated or revoked sooner.   Performed at [facility], [place] [address] [place], [place] [address]     Radiology Reports DG Chest 2 View  Result Date: 02/01/2021 CLINICAL DATA:  History of lung cancer EXAM: CHEST - 2 VIEW COMPARISON:  Radiograph 11/25/2011 FINDINGS: Right IJ central venous catheter tip terminates at the right atrium. Telemetry leads overlie the chest. Chronically coarsened interstitial changes and bronchitic features. Bandlike opacity is seen in the left lung apex with some tenting of the left hemidiaphragm, could reflect subsegmental atelectasis or volume loss though underlying airspace disease or architectural distortion could have a similar appearance. No focal consolidative opacity is seen. The cardiomediastinal contours are unremarkable. Few coronary stents are noted. Age-indeterminate left sixth and seventh posterolateral left rib fractures. No other acute osseous or soft tissue abnormalities the chest wall. IMPRESSION: Chronically coarsened interstitial and bronchitic features. New bandlike opacity in left lung apex with tenting of the left hemidiaphragm. Could reflect scarring or atelectatic change with volume loss. Underlying airspace disease or mass  is less favored though not fully excluded. Right IJ approach central venous catheter at the right atrium Posterolateral left sixth and seventh rib fractures, age indeterminate, correlate for point tenderness. Electronically Signed   By: Lovena Le M.D.   On: 02/01/2021 04:25   CARDIAC CATHETERIZATION  Result Date: 01/30/2021  The left ventricular ejection fraction is 45-50% by visual estimate. Mid to apical anterior hypokinesis.  LV end diastolic pressure is mildly elevated at 13 mmHg, with systolic 98 mmHg.  There is trivial (1+) mitral regurgitation.  --------------------------------------------  Culprit lesion: Prox LAD lesion is 99% stenosed. (Calcified, thrombotic)  A drug-eluting stent was successfully placed using a SYNERGY XD 2.50X16 -> postdilated 2.8 mm  Post intervention, there is a 0% residual stenosis.  Dist LAD lesion is 80% stenosed -> there is TIMI 0 flow to the apex, post PCI TIMI II flow, but still notable irregularities distally, likely distal embolism.  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  Distal LAD has TIMI I 0 flow.  Successful DES PCI of the LAD using a synergy DES 2.5 mm x 16 mm postdilated to 2.8 mm.  Otherwise angiographically normal coronary arteries with exception of the apical LAD.  Mild mid to apical anterior hypokinesis on LV gram with mildly reduced EF of roughly 45%.  Upper limit of normal LVEDP.  Borderline hypotension with systolic pressures ranging in the 95 to 105 mmHg range -> no evidence of shock  Severe hypOkalemia, i-STAT potassium read is 2.1, labs from ER read is 2.7.  Resolution of ventricular bigeminy. RECOMMENDATION  Admit to CVICU  Will run 3 rounds of IV potassium and recheck in 3 hours.  Continue IV Aggrastat for another 1 hour post PCI.  Check 2D echocardiogram, A1c and lipid panel the morning.  High-dose high intensity statin  Will attempt to start  low-dose beta-blocker given tachycardia, however with borderline pressures, may need to hold.  Cardiac rehab consult  Smoking cessation consult Glenetta Hew, MD  DG Chest [facility] 1 View  Result Date: 02/09/2021 CLINICAL DATA:  Weakness, history of recent STEMI EXAM: PORTABLE CHEST 1 VIEW COMPARISON:  01/31/2021 FINDINGS: Cardiac shadow is at the upper limits of normal in size. Prior  right jugular central line has been removed in the interval. The lungs are well aerated without focal infiltrate or sizable effusion. Chronic band like density is noted in the left upper lobe medially consistent with prior radiation therapy. This is stable from a prior CT from 12/23/2020 IMPRESSION: No acute abnormality noted. Prior radiation change in the medial left upper lobe. Electronically Signed   By: Inez Catalina M.D.   On: 02/09/2021 23:34   ECHOCARDIOGRAM COMPLETE  Result Date: 01/31/2021    ECHOCARDIOGRAM REPORT   Patient Name:   CHANTERIA HAGGARD Date of Exam: 01/31/2021 Medical Rec #:  [id number]         Height:       58.0 in Accession #:    [id number]        Weight:       100.1 lb Date of Birth:  1956-10-02         BSA:          1.357 m Patient Age:    89 years          BP:           133/91 mmHg Patient Gender: F                 HR:           73 bpm. Exam Location:  Inpatient Procedure: 2D Echo, Cardiac Doppler, Color Doppler and 3D Echo Indications:    Acute myocardial infarction  History:        Patient has no prior history of Echocardiogram examinations.                 Acute MI and CAD; Risk Factors:Current Smoker. GERD.  Sonographer:    Clayton Lefort RDCS (AE) Referring Phys: [place]  1. No left ventricular thrombus. Left ventricular ejection fraction, by estimation, is 25 to 30%. The left ventricle has severely decreased function. The left ventricle demonstrates regional wall motion abnormalities (see scoring diagram/findings for description). Left ventricular diastolic parameters are  consistent with Grade II diastolic dysfunction (pseudonormalization). Elevated left atrial pressure. There is severe hypokinesis of the left ventricular, entire anteroseptal wall and anterior wall. There is mild dyskinesis of the left ventricular, apical anterior wall and apical segment.  2. Right ventricular systolic function is normal. The right ventricular size is normal. There is mildly elevated pulmonary artery systolic pressure.  3. A small pericardial effusion is present. The pericardial effusion is circumferential.  4. The mitral valve is normal in structure. No evidence of mitral valve regurgitation.  5. The aortic valve is normal in structure. Aortic valve regurgitation is mild.  6. The inferior vena cava is normal in size with <50% respiratory variability, suggesting right atrial pressure of 8 mmHg. FINDINGS  Left Ventricle: No left ventricular thrombus. Left ventricular ejection fraction, by estimation, is 25 to 30%. The left ventricle has severely decreased function. The left ventricle demonstrates regional wall motion abnormalities. Severe hypokinesis of the left ventricular, entire anteroseptal wall and anterior wall. Mild dyskinesis of the left ventricular, apical anterior wall and apical segment. 3D left ventricular ejection fraction analysis performed but not reported based on interpreter judgement due to suboptimal quality. The left ventricular internal cavity size was normal in size. There is no left ventricular hypertrophy. Left ventricular diastolic parameters are consistent with Grade II diastolic dysfunction (pseudonormalization). Elevated left atrial pressure.  LV Wall Scoring: The apical anterior segment and apex are dyskinetic. The anterior wall, entire anterior septum,  apical lateral segment, mid inferoseptal segment, and apical inferior segment are hypokinetic. The antero-lateral wall, inferior wall, posterior wall, and basal inferoseptal segment are normal. Right Ventricle: The right  ventricular size is normal. No increase in right ventricular wall thickness. Right ventricular systolic function is normal. There is mildly elevated pulmonary artery systolic pressure. The tricuspid regurgitant velocity is 2.63  m/s, and with an assumed right atrial pressure of 8 mmHg, the estimated right ventricular systolic pressure is 90.2 mmHg. Left Atrium: Left atrial size was normal in size. Right Atrium: Right atrial size was normal in size. Pericardium: A small pericardial effusion is present. The pericardial effusion is circumferential. Mitral Valve: The mitral valve is normal in structure. No evidence of mitral valve regurgitation. MV peak gradient, 2.7 mmHg. The mean mitral valve gradient is 1.0 mmHg. Tricuspid Valve: The tricuspid valve is normal in structure. Tricuspid valve regurgitation is mild. Aortic Valve: The aortic valve is normal in structure. Aortic valve regurgitation is mild. Aortic valve mean gradient measures 2.0 mmHg. Aortic valve peak gradient measures 3.3 mmHg. Aortic valve area, by VTI measures 1.59 cm. Pulmonic Valve: The pulmonic valve was normal in structure. Pulmonic valve regurgitation is trivial. Aorta: The aortic root is normal in size and structure. Venous: The inferior vena cava is normal in size with less than 50% respiratory variability, suggesting right atrial pressure of 8 mmHg. IAS/Shunts: No atrial level shunt detected by color flow Doppler. Additional Comments: A venous catheter is visualized in the right atrium.  LEFT VENTRICLE PLAX 2D LVIDd:         3.80 cm  Diastology LVIDs:         2.30 cm  LV e' medial:    4.46 cm/s LV PW:         1.10 cm  LV E/e' medial:  11.5 LV IVS:        0.90 cm  LV e' lateral:   5.44 cm/s LVOT diam:     1.70 cm  LV E/e' lateral: 9.4 LV SV:         28 LV SV Index:   21 LVOT Area:     2.27 cm                          3D Volume EF:                         3D EF:        36 %                         LV EDV:       97 ml                         LV  ESV:       62 ml                         LV SV:        35 ml RIGHT VENTRICLE            IVC RV Basal diam:  2.50 cm    IVC diam: 1.80 cm RV S prime:     9.79 cm/s TAPSE (M-mode): 1.2 cm LEFT ATRIUM             Index       RIGHT ATRIUM  Index LA diam:        1.90 cm 1.40 cm/m  RA Area:     7.28 cm LA Vol (A2C):   23.0 ml 16.95 ml/m RA Volume:   13.40 ml 9.88 ml/m LA Vol (A4C):   23.2 ml 17.10 ml/m LA Biplane Vol: 24.4 ml 17.98 ml/m  AORTIC VALVE AV Area (Vmax):    1.65 cm AV Area (Vmean):   1.60 cm AV Area (VTI):     1.59 cm AV Vmax:           91.00 cm/s AV Vmean:          64.500 cm/s AV VTI:            0.176 m AV Peak Grad:      3.3 mmHg AV Mean Grad:      2.0 mmHg LVOT Vmax:         66.20 cm/s LVOT Vmean:        45.600 cm/s LVOT VTI:          0.123 m LVOT/AV VTI ratio: 0.70  AORTA Ao Root diam: 2.90 cm Ao Asc diam:  3.10 cm MITRAL VALVE               TRICUSPID VALVE MV Area (PHT): 3.77 cm    TR Peak grad:   27.7 mmHg MV Area VTI:   1.81 cm    TR Vmax:        263.00 cm/s MV Peak grad:  2.7 mmHg MV Mean grad:  1.0 mmHg    SHUNTS MV Vmax:       0.81 m/s    Systemic VTI:  0.12 m MV Vmean:      49.0 cm/s   Systemic Diam: 1.70 cm MV Decel Time: 201 msec MV E velocity: 51.40 cm/s MV A velocity: 41.60 cm/s MV E/A ratio:  1.24 Mihai Croitoru MD Electronically signed by Sanda Klein MD Signature Date/Time: 01/31/2021/12:09:51 PM    Final    ECHOCARDIOGRAM LIMITED  Result Date: 02/01/2021    ECHOCARDIOGRAM LIMITED REPORT   Patient Name:   MICHAILA KENNEY Date of Exam: 02/01/2021 Medical Rec #:  [id number]     Height:       57.0 in Accession #:    [id number]    Weight:       97.0 lb Date of Birth:  [birth year]     BSA:          1.322 m Patient Age:    47 years      BP:           94/64 mmHg Patient Gender: F             HR:           92 bpm. Exam Location:  Inpatient Procedure: Limited Color Doppler, Cardiac Doppler, Color Doppler and Limited            Echo STAT ECHO Indications:    Pericardial effusion with  hypotension. Assess for tamponade  History:        Patient has prior history of Echocardiogram examinations, most                 recent 01/31/2021. CAD and Previous Myocardial Infarction,                 Abnormal ECG and Left heart cath; Signs/Symptoms:Hypotension.                 Frequent falls with broken ribs.  Lung cancer.  Sonographer:    Merrie Roof Referring Phys: [place] Comments: This was a limited echo to rule out tamponade. IMPRESSIONS  1. Left ventricular ejection fraction, by estimation, is 25%. The left ventricle has severely decreased function. The left ventricle demonstrates regional wall motion abnormalities with mid to apical anteroseptal/inferoseptal/anterior akinesis, apical lateral akinesis, and akinesis of the true apex. Consistent with large LAD territory MI. There is mild left ventricular hypertrophy.  2. Right ventricular systolic function is normal. The right ventricular size is normal.  3. Small to moderate pericardial effusion primarily adjacent to the RV. The IVC collapses around 50% with respiration. Difficult to interpret respirometry due to frequent ectopy. RV diastolic collapse is not present. No overt pericardial tamponade. Pericardial effusion is similar to the prior study.  4. The inferior vena cava is normal in size with <50% respiratory variability, suggesting right atrial pressure of 8 mmHg.  5. Limited echo FINDINGS  Left Ventricle: Left ventricular ejection fraction, by estimation, is 25%. The left ventricle has severely decreased function. The left ventricle demonstrates regional wall motion abnormalities. The left ventricular internal cavity size was normal in size. There is mild left ventricular hypertrophy. Right Ventricle: The right ventricular size is normal. No increase in right ventricular wall thickness. Right ventricular systolic function is normal. Left Atrium: Left atrial size was normal in size. Right Atrium: Right atrial size was normal in  size. Pericardium: Small to moderate pericardial effusion primarily adjacent to the RV. The IVC collapses around 50% with respiration. Difficult to interpret respirometry due to frequent ectopy. RV diastolic collapse is not present. No overt pericardial tamponade. Venous: The inferior vena cava is normal in size with less than 50% respiratory variability, suggesting right atrial pressure of 8 mmHg. IAS/Shunts: No atrial level shunt detected by color flow Doppler. IVC IVC diam: 2.00 cm Loralie Champagne MD Electronically signed by Loralie Champagne MD Signature Date/Time: 02/01/2021/4:32:21 PM    Final

## 2021-02-12 ENCOUNTER — Other Ambulatory Visit (HOSPITAL_COMMUNITY): Payer: Medicaid Other

## 2021-02-12 ENCOUNTER — Inpatient Hospital Stay: Payer: Self-pay

## 2021-02-12 ENCOUNTER — Inpatient Hospital Stay (HOSPITAL_COMMUNITY): Payer: Medicaid Other

## 2021-02-12 DIAGNOSIS — I5022 Chronic systolic (congestive) heart failure: Secondary | ICD-10-CM | POA: Diagnosis not present

## 2021-02-12 DIAGNOSIS — U071 COVID-19: Secondary | ICD-10-CM

## 2021-02-12 DIAGNOSIS — E871 Hypo-osmolality and hyponatremia: Secondary | ICD-10-CM | POA: Diagnosis not present

## 2021-02-12 DIAGNOSIS — I2510 Atherosclerotic heart disease of native coronary artery without angina pectoris: Secondary | ICD-10-CM | POA: Diagnosis not present

## 2021-02-12 DIAGNOSIS — E876 Hypokalemia: Secondary | ICD-10-CM

## 2021-02-12 LAB — MAGNESIUM: Magnesium: 1.9 mg/dL (ref 1.7–2.4)

## 2021-02-12 LAB — COMPREHENSIVE METABOLIC PANEL
ALT: 23 U/L (ref 0–44)
AST: 23 U/L (ref 15–41)
Albumin: 3.1 g/dL — ABNORMAL LOW (ref 3.5–5.0)
Alkaline Phosphatase: 77 U/L (ref 38–126)
Anion gap: 8 (ref 5–15)
BUN: 8 mg/dL (ref 8–23)
CO2: 22 mmol/L (ref 22–32)
Calcium: 8.5 mg/dL — ABNORMAL LOW (ref 8.9–10.3)
Chloride: 93 mmol/L — ABNORMAL LOW (ref 98–111)
Creatinine, Ser: 0.76 mg/dL (ref 0.44–1.00)
GFR, Estimated: >60 mL/min (ref >60)
Glucose, Bld: 93 mg/dL (ref 70–99)
Potassium: 4.6 mmol/L (ref 3.5–5.1)
Sodium: 123 mmol/L — ABNORMAL LOW (ref 135–145)
Total Bilirubin: 0.6 mg/dL (ref 0.3–1.2)
Total Protein: 5.8 g/dL — ABNORMAL LOW (ref 6.5–8.1)

## 2021-02-12 LAB — BASIC METABOLIC PANEL
Anion gap: 6 (ref 5–15)
BUN: 12 mg/dL (ref 8–23)
CO2: 24 mmol/L (ref 22–32)
Calcium: 7.7 mg/dL — ABNORMAL LOW (ref 8.9–10.3)
Chloride: 95 mmol/L — ABNORMAL LOW (ref 98–111)
Creatinine, Ser: 0.75 mg/dL (ref 0.44–1.00)
GFR, Estimated: >60 mL/min (ref >60)
Glucose, Bld: 107 mg/dL — ABNORMAL HIGH (ref 70–99)
Potassium: 4.2 mmol/L (ref 3.5–5.1)
Sodium: 125 mmol/L — ABNORMAL LOW (ref 135–145)

## 2021-02-12 MED ORDER — SODIUM CHLORIDE 0.9% FLUSH
10.0000 mL | Freq: Two times a day (BID) | INTRAVENOUS | Status: DC
  Administered 2021-02-13: 3 mL
  Administered 2021-02-15 – 2021-02-19 (×8): 10 mL

## 2021-02-12 MED ORDER — SODIUM CHLORIDE 0.9 % IV SOLN: INTRAVENOUS | Status: AC

## 2021-02-12 MED ORDER — SODIUM CHLORIDE 0.9 % IV BOLUS
500.0000 mL | Freq: Once | INTRAVENOUS | Status: AC
  Administered 2021-02-12: 500 mL via INTRAVENOUS

## 2021-02-12 MED ORDER — MAGNESIUM SULFATE 2 GM/50ML IV SOLN
2.0000 g | Freq: Once | INTRAVENOUS | Status: AC
  Administered 2021-02-12: 2 g via INTRAVENOUS
  Filled 2021-02-12: qty 50

## 2021-02-12 MED ORDER — MIDODRINE HCL 5 MG PO TABS
5.0000 mg | ORAL_TABLET | Freq: Three times a day (TID) | ORAL | Status: DC
  Administered 2021-02-12 – 2021-02-13 (×3): 5 mg via ORAL
  Filled 2021-02-12 (×3): qty 1

## 2021-02-12 MED ORDER — CHLORHEXIDINE GLUCONATE CLOTH 2 % EX PADS
6.0000 | MEDICATED_PAD | Freq: Every day | CUTANEOUS | Status: DC
  Administered 2021-02-12 – 2021-02-18 (×5): 6 via TOPICAL

## 2021-02-12 MED ORDER — MIDODRINE HCL 5 MG PO TABS: 5.0000 mg | ORAL_TABLET | Freq: Three times a day (TID) | ORAL | Status: DC

## 2021-02-12 MED ORDER — SODIUM CHLORIDE 0.9% FLUSH: 10.0000 mL | INTRAVENOUS | Status: DC | PRN

## 2021-02-12 NOTE — Progress Notes (Addendum)
Patient pulled out PICC line, no bleeding noted.  Pressure held, occlusive dressing applied.  Physician notified,  Lab called to draw labs previously ordered.  IV team consulted to insert central line per Dr Sloan Leiter.   Line appears to be intact, 38 cm length noted

## 2021-02-12 NOTE — Progress Notes (Addendum)
PROGRESS NOTE                                                                                                                                                                                                             Patient Demographics:    Colleen Fleming, is a 65 y.o. female, DOB - [birth year], CLE:[id number]  Outpatient Primary MD for the patient is Belva Bertin, [place]   Admit date - 02/09/2021   LOS - 2  Chief Complaint  Patient presents with  . Weakness       Brief Narrative: Patient is a 65 y.o. female with PMHx of CAD-recent PCI, chronic systolic heart failure-presented with diarrhea-found to have severe hyponatremia.  Found to have COVID-19 infection.  COVID-19 vaccinated status:  Significant Events: 5/10>> Admit to [facility] for diarrhea-vit D-found to have hyponatremia-sodium 117.  Significant studies: 5/10>>Chest x-ray: No pneumonia  COVID-19 medications: Remdesivir:5/11>>  Antibiotics: None  Microbiology data: None  Procedures: None  Consults: Advanced heart failure team.  DVT prophylaxis: Place TED hose Start: 02/12/21 1320 Place and maintain sequential compression device Start: 02/10/21 0202    Subjective:   No shortness of breath-no leg edema.  Diarrhea has completely resolved.  Anxious to go home.   Assessment  & Plan :   Hyponatremia: Due to hypovolemia-initially improved with IVF-subsequently IVF stopped yesterday-however sodium levels have decreased to 123.  She claims she has no appetite-and has been mostly drinking water and soda.  Suspect that worsening sodium still may be from hypovolemia-worsened by excessive free water intake.  We will attempt volume expansion-resume normal saline at 50 cc an hour x10 hours and see if this helps correct hyponatremia.  Have asked patient to limit free water intake-will place on 1200 cc of fluid restrictions daily.  Repeat electrolytes later  today-if sodium still has not improved-we may need to consider initiation of diuretics however blood pressure is on the lower side today.  Hypokalemia: Due to GI losses-resolved.  Diarrhea: Likely from COVID 19 infection-resolved.  COVID-19 infection: Likely causing diarrhea-on Remdesivir x3 days.  Hypotension: She is asymptomatic-her volume status is much improved compared to yesterday-attempting to see if this improves with IV fluids.  Given her recent STEMI-and low EF-concerned that this may be cardiogenic in nature-however clinically she does not appear to have overt cardiogenic shock.  I will go  ahead and consult the advanced heart failure team for further assistance/guidance.  Recent STEMI-s/p PCI: No anginal symptoms-on antiplatelets/statin/beta-blocker.  Chronic systolic heart failure (EF 25% on echo 5/2): Volume status stable-see above.  COVID-19 Labs: Recent Labs    02/10/21 1629 02/11/21 0914  DDIMER 1.29*  --   LDH 225*  --   CRP 0.6 0.7       Component Value Date/Time   BNP 792.9 (H) 02/01/2021 1100    Recent Labs  Lab 02/10/21 1629  PROCALCITON <0.10    Lab Results  Component Value Date   SARSCOV2NAA POSITIVE (A) 02/10/2021   SARSCOV2NAA NEGATIVE 01/30/2021     ABG:    Component Value Date/Time   TCO2 19 (L) 01/30/2021 1924   O2SAT 75.0 02/03/2021 0555    Vent Settings: N/A  Condition - Stable  Family Communication  : Son-[name] updated over the phone on 5/13  Code Status :  Full Code  Diet :  Diet Order            Diet Heart Room service appropriate? Yes; Fluid consistency: Thin; Fluid restriction: 1200 mL Fluid  Diet effective now                  Disposition Plan  :   Status is: Inpatient  Remains inpatient appropriate because:Inpatient level of care appropriate due to severity of illness   Dispo:  Patient From: Home  Planned Disposition: Home  Medically stable for discharge: No      Barriers to discharge:  Hypoxia requiring O2 supplementation/complete 5 days of IV Remdesivir  Antimicorbials  :    Anti-infectives (From admission, onward)   Start     Dose/Rate Route Frequency Ordered Stop   02/11/21 1000  remdesivir 100 mg in sodium chloride 0.9 % 100 mL IVPB       "Followed by" Linked Group Details   100 mg 200 mL/hr over 30 Minutes Intravenous Daily 02/10/21 1410 02/12/21 0951   02/10/21 1515  remdesivir 200 mg in sodium chloride 0.9% 250 mL IVPB       "Followed by" Linked Group Details   200 mg 580 mL/hr over 30 Minutes Intravenous Once 02/10/21 1410 02/10/21 1733      Inpatient Medications  Scheduled Meds: . aspirin EC  81 mg Oral Daily  . atorvastatin  80 mg Oral Daily  . enoxaparin (LOVENOX) injection  20 mg Subcutaneous Q24H  . midodrine  5 mg Oral TID WC  . ticagrelor  90 mg Oral BID   Continuous Infusions: . sodium chloride 50 mL/hr at 02/12/21 0919  . sodium chloride     PRN Meds:.acetaminophen, melatonin, nitroGLYCERIN, prochlorperazine   Time Spent in minutes  35    See all Orders from today for further details   Oren Binet M.D on 02/12/2021 at 1:32 PM  To page go to [email or web address] - use universal password  [facility] -  Office  [phone]    Objective:   Vitals:   02/11/21 2200 02/12/21 0348 02/12/21 0819 02/12/21 1143  BP: (!) 74/60 106/75 (!) 83/61 (!) 80/56  Pulse: 71 75 63 66  Resp: 20 (!) 23 16 18   Temp: 98.2 F (36.8 C) 98.2 F (36.8 C) 98.7 F (37.1 C) 97.6 F (36.4 C)  TempSrc: Oral Oral Oral Oral  SpO2: 96% 95% 96% 97%    Wt Readings from Last 3 Encounters:  02/04/21 43.4 kg  11/25/11 56.6 kg     Intake/Output Summary (Last 24 hours) at 02/12/2021  1332 Last data filed at 02/12/2021 0353 Gross per 24 hour  Intake 340 ml  Output --  Net 340 ml     Physical Exam Gen Exam:Alert awake-not in any distress HEENT:atraumatic, normocephalic Chest: B/L clear to auscultation anteriorly CVS:S1S2 regular Abdomen:soft non  tender, non distended Extremities:no edema Neurology: Non focal Skin: no rash   Data Review:    CBC Recent Labs  Lab 02/09/21 2248 02/10/21 0226 02/11/21 0914  WBC 4.1 3.8* 7.4  HGB 11.5* 10.6* 12.4  HCT 32.7* 30.9* 35.0*  PLT 282 256 279  MCV 91.3 92.8 91.1  MCH 32.1 31.8 32.3  MCHC 35.2 34.3 35.4  RDW 12.1 12.2 12.8  LYMPHSABS  --   --  0.9  MONOABS  --   --  0.7  EOSABS  --   --  0.0  BASOSABS  --   --  0.0    Chemistries  Recent Labs  Lab 02/09/21 2248 02/10/21 0034 02/10/21 0226 02/10/21 0900 02/10/21 1629 02/10/21 2048 02/11/21 0102 02/11/21 0914 02/12/21 0544  NA 117* 121* 121*   < > 128* 126* 126* 125* 123*  K 2.8*  --  3.1*  --  3.9  --   --  4.7 4.6  CL 87*  --  91*  --   --   --   --  98 93*  CO2 20*  --  20*  --   --   --   --  21* 22  GLUCOSE 134*  --  118*  --   --   --   --  92 93  BUN 5*  --  5*  --   --   --   --  <5* 8  CREATININE 0.77  --  0.76  --   --   --   --  0.78 0.76  CALCIUM 7.7*  --  7.9*  --   --   --   --  8.4* 8.5*  MG  --  1.6* 1.5*  --   --   --   --   --  1.9  AST  --   --  28  --   --   --   --  27 23  ALT  --   --  25  --   --   --   --  24 23  ALKPHOS  --   --  72  --   --   --   --  81 77  BILITOT  --   --  0.7  --   --   --   --  0.4 0.6   < > = values in this interval not displayed.   ------------------------------------------------------------------------------------------------------------------ No results for input(s): CHOL, HDL, LDLCALC, TRIG, CHOLHDL, LDLDIRECT in the last 72 hours.  Lab Results  Component Value Date   HGBA1C 5.4 01/31/2021   ------------------------------------------------------------------------------------------------------------------ Recent Labs    02/10/21 0031  TSH 1.140   ------------------------------------------------------------------------------------------------------------------ No results for input(s): VITAMINB12, FOLATE, FERRITIN, TIBC, IRON, RETICCTPCT in the last 72  hours.  Coagulation profile No results for input(s): INR, PROTIME in the last 168 hours.  Recent Labs    02/10/21 1629  DDIMER 1.29*    Cardiac Enzymes No results for input(s): CKMB, TROPONINI, MYOGLOBIN in the last 168 hours.  Invalid input(s): CK ------------------------------------------------------------------------------------------------------------------    Component Value Date/Time   BNP 792.9 (H) 02/01/2021 1100    Micro Results Recent Results (from the past 240 hour(s))  SARS  CORONAVIRUS 2 (TAT 6-24 HRS) Nasopharyngeal Nasopharyngeal Swab     Status: Abnormal   Collection Time: 02/10/21 12:18 AM   Specimen: Nasopharyngeal Swab  Result Value Ref Range Status   SARS Coronavirus 2 POSITIVE (A) NEGATIVE Final    Comment: (NOTE) SARS-CoV-2 target nucleic acids are DETECTED.  The SARS-CoV-2 RNA is generally detectable in upper and lower respiratory specimens during the acute phase of infection. Positive results are indicative of the presence of SARS-CoV-2 RNA. Clinical correlation with patient history and other diagnostic information is  necessary to determine patient infection status. Positive results do not rule out bacterial infection or co-infection with other viruses.  The expected result is Negative.  Fact Sheet for Patients: SugarRoll.be  Fact Sheet for Healthcare Providers: https://www.woods-mathews.com/  This test is not yet approved or cleared by the [place] FDA and  has been authorized for detection and/or diagnosis of SARS-CoV-2 by FDA under an Emergency Use Authorization (EUA). This EUA will remain  in effect (meaning this test can be used) for the duration of the COVID-19 declaration under Section 564(b)(1) of the Act, 21 U. S.C. section 360bbb-3(b)(1), unless the authorization is terminated or revoked sooner.   Performed at [facility], [place] [address], [place], [place] [address]      Radiology Reports DG Chest 2 View  Result Date: 02/01/2021 CLINICAL DATA:  History of lung cancer EXAM: CHEST - 2 VIEW COMPARISON:  Radiograph 11/25/2011 FINDINGS: Right IJ central venous catheter tip terminates at the right atrium. Telemetry leads overlie the chest. Chronically coarsened interstitial changes and bronchitic features. Bandlike opacity is seen in the left lung apex with some tenting of the left hemidiaphragm, could reflect subsegmental atelectasis or volume loss though underlying airspace disease or architectural distortion could have a similar appearance. No focal consolidative opacity is seen. The cardiomediastinal contours are unremarkable. Few coronary stents are noted. Age-indeterminate left sixth and seventh posterolateral left rib fractures. No other acute osseous or soft tissue abnormalities the chest wall. IMPRESSION: Chronically coarsened interstitial and bronchitic features. New bandlike opacity in left lung apex with tenting of the left hemidiaphragm. Could reflect scarring or atelectatic change with volume loss. Underlying airspace disease or mass is less favored though not fully excluded. Right IJ approach central venous catheter at the right atrium Posterolateral left sixth and seventh rib fractures, age indeterminate, correlate for point tenderness. Electronically Signed   By: Lovena Le M.D.   On: 02/01/2021 04:25   CARDIAC CATHETERIZATION  Result Date: 01/30/2021  The left ventricular ejection fraction is 45-50% by visual estimate. Mid to apical anterior hypokinesis.  LV end diastolic pressure is mildly elevated at 13 mmHg, with systolic 98 mmHg.  There is trivial (1+) mitral regurgitation.  --------------------------------------------  Culprit lesion: Prox LAD lesion is 99% stenosed. (Calcified, thrombotic)  A drug-eluting stent was successfully placed using a SYNERGY XD 2.50X16 -> postdilated 2.8 mm  Post intervention, there is a 0% residual stenosis.  Dist  LAD lesion is 80% stenosed -> there is TIMI 0 flow to the apex, post PCI TIMI II flow, but still notable irregularities distally, likely distal embolism.  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  Distal LAD has TIMI I 0 flow.  Successful DES PCI of the LAD using a synergy DES 2.5 mm x 16 mm postdilated to 2.8 mm.  Otherwise angiographically normal coronary arteries with exception of the apical LAD.  Mild mid to apical anterior hypokinesis  on LV gram with mildly reduced EF of roughly 45%.  Upper limit of normal LVEDP.  Borderline hypotension with systolic pressures ranging in the 95 to 105 mmHg range -> no evidence of shock  Severe hypOkalemia, i-STAT potassium read is 2.1, labs from ER read is 2.7.  Resolution of ventricular bigeminy. RECOMMENDATION  Admit to CVICU  Will run 3 rounds of IV potassium and recheck in 3 hours.  Continue IV Aggrastat for another 1 hour post PCI.  Check 2D echocardiogram, A1c and lipid panel the morning.  High-dose high intensity statin  Will attempt to start low-dose beta-blocker given tachycardia, however with borderline pressures, may need to hold.  Cardiac rehab consult  Smoking cessation consult Glenetta Hew, MD  DG Chest [facility] 1 View  Result Date: 02/09/2021 CLINICAL DATA:  Weakness, history of recent STEMI EXAM: PORTABLE CHEST 1 VIEW COMPARISON:  01/31/2021 FINDINGS: Cardiac shadow is at the upper limits of normal in size. Prior right jugular central line has been removed in the interval. The lungs are well aerated without focal infiltrate or sizable effusion. Chronic band like density is noted in the left upper lobe medially consistent with prior radiation therapy. This is stable from a prior CT from 12/23/2020 IMPRESSION: No acute abnormality noted. Prior radiation change in the medial left upper lobe. Electronically Signed   By: Inez Catalina M.D.   On: 02/09/2021 23:34    ECHOCARDIOGRAM COMPLETE  Result Date: 01/31/2021    ECHOCARDIOGRAM REPORT   Patient Name:   TILA MILLIRONS Date of Exam: 01/31/2021 Medical Rec #:  [id number]         Height:       58.0 in Accession #:    [id number]        Weight:       100.1 lb Date of Birth:  17-Mar-1956         BSA:          1.357 m Patient Age:    33 years          BP:           133/91 mmHg Patient Gender: F                 HR:           73 bpm. Exam Location:  Inpatient Procedure: 2D Echo, Cardiac Doppler, Color Doppler and 3D Echo Indications:    Acute myocardial infarction  History:        Patient has no prior history of Echocardiogram examinations.                 Acute MI and CAD; Risk Factors:Current Smoker. GERD.  Sonographer:    Clayton Lefort RDCS (AE) Referring Phys: [place]  1. No left ventricular thrombus. Left ventricular ejection fraction, by estimation, is 25 to 30%. The left ventricle has severely decreased function. The left ventricle demonstrates regional wall motion abnormalities (see scoring diagram/findings for description). Left ventricular diastolic parameters are consistent with Grade II diastolic dysfunction (pseudonormalization). Elevated left atrial pressure. There is severe hypokinesis of the left ventricular, entire anteroseptal wall and anterior wall. There is mild dyskinesis of the left ventricular, apical anterior wall and apical segment.  2. Right ventricular systolic function is normal. The right ventricular size is normal. There is mildly elevated pulmonary artery systolic pressure.  3. A small pericardial effusion is present. The pericardial effusion is circumferential.  4. The mitral valve is normal in structure. No evidence  of mitral valve regurgitation.  5. The aortic valve is normal in structure. Aortic valve regurgitation is mild.  6. The inferior vena cava is normal in size with <50% respiratory variability, suggesting right atrial pressure of 8 mmHg. FINDINGS  Left Ventricle: No  left ventricular thrombus. Left ventricular ejection fraction, by estimation, is 25 to 30%. The left ventricle has severely decreased function. The left ventricle demonstrates regional wall motion abnormalities. Severe hypokinesis of the left ventricular, entire anteroseptal wall and anterior wall. Mild dyskinesis of the left ventricular, apical anterior wall and apical segment. 3D left ventricular ejection fraction analysis performed but not reported based on interpreter judgement due to suboptimal quality. The left ventricular internal cavity size was normal in size. There is no left ventricular hypertrophy. Left ventricular diastolic parameters are consistent with Grade II diastolic dysfunction (pseudonormalization). Elevated left atrial pressure.  LV Wall Scoring: The apical anterior segment and apex are dyskinetic. The anterior wall, entire anterior septum, apical lateral segment, mid inferoseptal segment, and apical inferior segment are hypokinetic. The antero-lateral wall, inferior wall, posterior wall, and basal inferoseptal segment are normal. Right Ventricle: The right ventricular size is normal. No increase in right ventricular wall thickness. Right ventricular systolic function is normal. There is mildly elevated pulmonary artery systolic pressure. The tricuspid regurgitant velocity is 2.63  m/s, and with an assumed right atrial pressure of 8 mmHg, the estimated right ventricular systolic pressure is 09.9 mmHg. Left Atrium: Left atrial size was normal in size. Right Atrium: Right atrial size was normal in size. Pericardium: A small pericardial effusion is present. The pericardial effusion is circumferential. Mitral Valve: The mitral valve is normal in structure. No evidence of mitral valve regurgitation. MV peak gradient, 2.7 mmHg. The mean mitral valve gradient is 1.0 mmHg. Tricuspid Valve: The tricuspid valve is normal in structure. Tricuspid valve regurgitation is mild. Aortic Valve: The aortic valve  is normal in structure. Aortic valve regurgitation is mild. Aortic valve mean gradient measures 2.0 mmHg. Aortic valve peak gradient measures 3.3 mmHg. Aortic valve area, by VTI measures 1.59 cm. Pulmonic Valve: The pulmonic valve was normal in structure. Pulmonic valve regurgitation is trivial. Aorta: The aortic root is normal in size and structure. Venous: The inferior vena cava is normal in size with less than 50% respiratory variability, suggesting right atrial pressure of 8 mmHg. IAS/Shunts: No atrial level shunt detected by color flow Doppler. Additional Comments: A venous catheter is visualized in the right atrium.  LEFT VENTRICLE PLAX 2D LVIDd:         3.80 cm  Diastology LVIDs:         2.30 cm  LV e' medial:    4.46 cm/s LV PW:         1.10 cm  LV E/e' medial:  11.5 LV IVS:        0.90 cm  LV e' lateral:   5.44 cm/s LVOT diam:     1.70 cm  LV E/e' lateral: 9.4 LV SV:         28 LV SV Index:   21 LVOT Area:     2.27 cm                          3D Volume EF:                         3D EF:        36 %  LV EDV:       97 ml                         LV ESV:       62 ml                         LV SV:        35 ml RIGHT VENTRICLE            IVC RV Basal diam:  2.50 cm    IVC diam: 1.80 cm RV S prime:     9.79 cm/s TAPSE (M-mode): 1.2 cm LEFT ATRIUM             Index       RIGHT ATRIUM          Index LA diam:        1.90 cm 1.40 cm/m  RA Area:     7.28 cm LA Vol (A2C):   23.0 ml 16.95 ml/m RA Volume:   13.40 ml 9.88 ml/m LA Vol (A4C):   23.2 ml 17.10 ml/m LA Biplane Vol: 24.4 ml 17.98 ml/m  AORTIC VALVE AV Area (Vmax):    1.65 cm AV Area (Vmean):   1.60 cm AV Area (VTI):     1.59 cm AV Vmax:           91.00 cm/s AV Vmean:          64.500 cm/s AV VTI:            0.176 m AV Peak Grad:      3.3 mmHg AV Mean Grad:      2.0 mmHg LVOT Vmax:         66.20 cm/s LVOT Vmean:        45.600 cm/s LVOT VTI:          0.123 m LVOT/AV VTI ratio: 0.70  AORTA Ao Root diam: 2.90 cm Ao Asc diam:  3.10 cm  MITRAL VALVE               TRICUSPID VALVE MV Area (PHT): 3.77 cm    TR Peak grad:   27.7 mmHg MV Area VTI:   1.81 cm    TR Vmax:        263.00 cm/s MV Peak grad:  2.7 mmHg MV Mean grad:  1.0 mmHg    SHUNTS MV Vmax:       0.81 m/s    Systemic VTI:  0.12 m MV Vmean:      49.0 cm/s   Systemic Diam: 1.70 cm MV Decel Time: 201 msec MV E velocity: 51.40 cm/s MV A velocity: 41.60 cm/s MV E/A ratio:  1.24 Mihai Croitoru MD Electronically signed by Sanda Klein MD Signature Date/Time: 01/31/2021/12:09:51 PM    Final    ECHOCARDIOGRAM LIMITED  Result Date: 02/01/2021    ECHOCARDIOGRAM LIMITED REPORT   Patient Name:   SHWETA AMAN Date of Exam: 02/01/2021 Medical Rec #:  [id number]     Height:       57.0 in Accession #:    [id number]    Weight:       97.0 lb Date of Birth:  April 16, 1956     BSA:          1.322 m Patient Age:    26 years      BP:           94/64 mmHg  Patient Gender: F             HR:           92 bpm. Exam Location:  Inpatient Procedure: Limited Color Doppler, Cardiac Doppler, Color Doppler and Limited            Echo STAT ECHO Indications:    Pericardial effusion with hypotension. Assess for tamponade  History:        Patient has prior history of Echocardiogram examinations, most                 recent 01/31/2021. CAD and Previous Myocardial Infarction,                 Abnormal ECG and Left heart cath; Signs/Symptoms:Hypotension.                 Frequent falls with broken ribs. Lung cancer.  Sonographer:    Merrie Roof Referring Phys: [place] Comments: This was a limited echo to rule out tamponade. IMPRESSIONS  1. Left ventricular ejection fraction, by estimation, is 25%. The left ventricle has severely decreased function. The left ventricle demonstrates regional wall motion abnormalities with mid to apical anteroseptal/inferoseptal/anterior akinesis, apical lateral akinesis, and akinesis of the true apex. Consistent with large LAD territory MI. There is mild left ventricular  hypertrophy.  2. Right ventricular systolic function is normal. The right ventricular size is normal.  3. Small to moderate pericardial effusion primarily adjacent to the RV. The IVC collapses around 50% with respiration. Difficult to interpret respirometry due to frequent ectopy. RV diastolic collapse is not present. No overt pericardial tamponade. Pericardial effusion is similar to the prior study.  4. The inferior vena cava is normal in size with <50% respiratory variability, suggesting right atrial pressure of 8 mmHg.  5. Limited echo FINDINGS  Left Ventricle: Left ventricular ejection fraction, by estimation, is 25%. The left ventricle has severely decreased function. The left ventricle demonstrates regional wall motion abnormalities. The left ventricular internal cavity size was normal in size. There is mild left ventricular hypertrophy. Right Ventricle: The right ventricular size is normal. No increase in right ventricular wall thickness. Right ventricular systolic function is normal. Left Atrium: Left atrial size was normal in size. Right Atrium: Right atrial size was normal in size. Pericardium: Small to moderate pericardial effusion primarily adjacent to the RV. The IVC collapses around 50% with respiration. Difficult to interpret respirometry due to frequent ectopy. RV diastolic collapse is not present. No overt pericardial tamponade. Venous: The inferior vena cava is normal in size with less than 50% respiratory variability, suggesting right atrial pressure of 8 mmHg. IAS/Shunts: No atrial level shunt detected by color flow Doppler. IVC IVC diam: 2.00 cm Loralie Champagne MD Electronically signed by Loralie Champagne MD Signature Date/Time: 02/01/2021/4:32:21 PM    Final    [place] EKG SITE RITE  Result Date: 02/12/2021 If Site Rite image not attached, placement could not be confirmed due to current cardiac rhythm.

## 2021-02-12 NOTE — Progress Notes (Signed)
Report called to [place], Therapist, sports. Low BP's,pt asymptomatic.

## 2021-02-12 NOTE — Progress Notes (Signed)
CVP set up. Co-ox sent to lab. Lab called to verified.

## 2021-02-12 NOTE — Consult Note (Addendum)
Advanced Heart Failure Team Consult Note   Primary Physician: Elisabeth Cara, [name] PCP-Cardiologist:  Glenetta Hew, MD  [facility]: Dr. Haroldine Laws   Reason for Consultation: Systolic Heart Failure Management in setting of hypotension and hyponatremia   HPI:    Colleen Fleming is seen today for evaluation of systolic heart failure management in the setting of hypotension and hyponatremia at the request of Dr. Evalee Mutton, Internal Medicine.   65 y/o WF, long time heavy smoker, w/ COPD and h/o lung cancer treated w/ chemo and radiation, now in remission, admitted on 4/30 for acute anterior STEMI. Emergent LHC showed 99% pLAD occlusion treated w/ PCI + DES, also w/ 80% dLAD stenosis to be treated medically but no other coronary disease. Hs trop >27,000. LDL 139. Initial LVG at time of cath w/ mildly reduced LVEF, estimated ~45-50%. However 2D echo showed severely reduced LVEF, 25-30% w/ severe HK of the left ventricular, entire anteroseptal and anterior wall. No MR. RV normal.Small pericardial effusion.  She was placed on DAPT w/ ASA + Brilinta + high intensity statin + low dose?blocker and SGLT2i (hgb A1c 5.4).   On 5/2, she became hypotensive w/ concerns for early developing CGS and AHF team was consulted. CO2 was 21, though Co-ox was ok at 68%. She was started on NE for hypotension. Repeat limited echo showed no mechanical complications. RV was small. Small pericardial effusion also noted. CVP was low at 2.  Scr was normal at 0.77. Felt to be dry. Diuretics and Wilder Glade were discontinued and she was given IVF boluses. BP improved and she was able to wean off NE w/ stable co-ox. Her BP did remain soft but she was able to tolerate low dose Toprol XL (added for PACs) and low dose spiro. PACs resolved w/ low dose BP and correction of hypokalemia.   Also of note, she had mild diarrhea during prior admit that resolved w/ imodium. Also w/ incidental finding of atraumatic posterolateral left sixth  and seventh rib fractures noted on CXR. Given h/o lung cancer and ongoing tobacco use, there was concern for pathologic fx from possible recurrence of lung CA. She was advised to f/u w/ her PCP and oncologist for further evaluation. She was discharged home on 5/5.  Returns back to the ED on 5/11 w/ diarrhea and weakness. Found to be severely hyponatremic w/ Na of 117 and hypokalemic at 2.8. Hypocalcemic 7.7 CO2 20. Hypotensive w/ SBPs 80s-90s. COVID +. She was admitted and started on IVF hydration w/ normal saline and started on K and Ca supplementation. C-diff testing and GI panel pending.   Na level improved up to 129, but steadily trending back down at 123 today. Net I/Os + 1.1L. Wt not charted this admit. She was 95 lb when discharged on 5/5.   SBPs remains low in the upper 70s. SCr/BUN ok 0.76/8.   CO2 20>>21>>22.    Echo 01/31/21 (previous admit) LVEF 25-30%, GIIDD, RV normal   Review of Systems: [y] = yes, [ ]  = no   . General: Weight gain [ ] ; Weight loss [ ] ; Anorexia [Y ]; Fatigue [ Y]; Fever [ ] ; Chills [ ] ; Weakness [ Y]  . Cardiac: Chest pain/pressure [ ] ; Resting SOB [ ] ; Exertional SOB [ ] ; Orthopnea [ ] ; Pedal Edema [ ] ; Palpitations [ ] ; Syncope [ ] ; Presyncope [ ] ; Paroxysmal nocturnal dyspnea[ ]   . Pulmonary: Cough [ ] ; Wheezing[ ] ; Hemoptysis[ ] ; Sputum [ ] ; Snoring [ ]   . GI: Vomiting[ ] ;  Dysphagia[ ] ; Melena[ ] ; Hematochezia [ ] ; Heartburn[ ] ; Abdominal pain [ ] ; Constipation [ ] ; Diarrhea [Y ]; BRBPR [ ]   . GU: Hematuria[ ] ; Dysuria [ ] ; Nocturia[ ]   . Vascular: Pain in legs with walking [ ] ; Pain in feet with lying flat [ ] ; Non-healing sores [ ] ; Stroke [ ] ; TIA [ ] ; Slurred speech [ ] ;  . Neuro: Headaches[ ] ; Vertigo[ ] ; Seizures[ ] ; Paresthesias[ ] ;Blurred vision [ ] ; Diplopia [ ] ; Vision changes [ ]   . Ortho/Skin: Arthritis [ ] ; Joint pain [ ] ; Muscle pain [ ] ; Joint swelling [ ] ; Back Pain [ ] ; Rash [ ]   . Psych: Depression[ ] ; Anxiety[ ]   . Heme: Bleeding problems  [ ] ; Clotting disorders [ ] ; Anemia [ ]   . Endocrine: Diabetes [ ] ; Thyroid dysfunction[ ]   Home Medications Prior to Admission medications   Medication Sig Start Date End Date Taking? Authorizing Provider  aspirin 81 MG EC tablet Take 1 tablet (81 mg total) by mouth daily. Swallow whole. 02/05/21   Lyda Jester M, [name]  atorvastatin (LIPITOR) 80 MG tablet Take 1 tablet (80 mg total) by mouth daily. 02/05/21   Lyda Jester M, [name]  metoprolol succinate (TOPROL-XL) 25 MG 24 hr tablet Take 0.5 tablets (12.5 mg total) by mouth daily. 02/05/21   Lyda Jester M, [name]  nitroGLYCERIN (NITROSTAT) 0.4 MG SL tablet Place 1 tablet (0.4 mg total) under the tongue every 5 (five) minutes x 3 doses as needed for chest pain. 02/04/21   Consuelo Pandy, [name]  spironolactone (ALDACTONE) 25 MG tablet Take 0.5 tablets (12.5 mg total) by mouth daily. 02/04/21   Consuelo Pandy, [name]  ticagrelor (BRILINTA) 90 MG TABS tablet Take 1 tablet (90 mg total) by mouth 2 (two) times daily. 02/04/21   Consuelo Pandy, [name]    Past Medical History: Past Medical History:  Diagnosis Date  . Anxiety and depression   . COPD (chronic obstructive pulmonary disease) (HCC)    Long-term smoker greater than 35 years 1 to 2 pack a day.  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 GERD (gastroesophageal reflux disease)   . History of seizure disorder   . Hyperlipidemia   . Obesity   . Small cell lung cancer, left upper lobe ([place]) 2014   Treated with radiation and chemotherapy [facility])    Past Surgical History: Past Surgical History:  Procedure Laterality Date  . CORONARY/GRAFT ACUTE MI REVASCULARIZATION N/A 01/30/2021   Procedure: Coronary/Graft Acute MI Revascularization;  Surgeon: Leonie Man, MD;  Location: [facility];  Service: Cardiovascular;  Laterality: N/A;  . LEFT HEART CATH AND CORONARY ANGIOGRAPHY N/A 01/30/2021   Procedure: LEFT HEART CATH  AND CORONARY ANGIOGRAPHY;  Surgeon: Leonie Man, MD;  Location: [facility];  Service: Cardiovascular;  Laterality: N/A;    Family History: Family History  Problem Relation Age of Onset  . Cancer Paternal Grandmother   . Cancer Paternal Aunt     Social History: Social History   Socioeconomic History  . Marital status: Divorced    Spouse name: Not on file  . Number of children: 2  . Years of education: Not on file  . Highest education level: Not on file  Occupational History  . Not on file  Tobacco Use  . Smoking status: Current Every Day Smoker    Packs/day: 1.50    Years: 40.00    Pack years:  60.00  . Smokeless tobacco: Never Used  Substance and Sexual Activity  . Alcohol use: Not Currently  . Drug use: Never  . Sexual activity: Not on file  Other Topics Concern  . Not on file  Social History Narrative   Currently divorced.  No longer working.  Previously worked in [employer].   Lives near [place].   Still smokes about 2 packs a day.   Social Determinants of Health   Financial Resource Strain: Low Risk   . Difficulty of Paying Living Expenses: Not very hard  Food Insecurity: No Food Insecurity  . Worried About Charity fundraiser in the Last Year: Never true  . Ran Out of Food in the Last Year: Never true  Transportation Needs: No Transportation Needs  . Lack of Transportation (Medical): No  . Lack of Transportation (Non-Medical): No  Physical Activity: Not on file  Stress: Not on file  Social Connections: Not on file    Allergies:  Allergies  Allergen Reactions  . Penicillins Hives    Objective:    Vital Signs:   Temp:  [97.6 F (36.4 C)-98.7 F (37.1 C)] 97.6 F (36.4 C) (05/13 1143) Pulse Rate:  [63-93] 66 (05/13 1143) Resp:  [16-23] 18 (05/13 1143) BP: (71-106)/(56-75) 80/56 (05/13 1143) SpO2:  [95 %-100 %] 97 % (05/13 1143) Last BM Date: 02/10/21  Weight change: There were no vitals filed for this  visit.  Intake/Output:   Intake/Output Summary (Last 24 hours) at 02/12/2021 1226 Last data filed at 02/12/2021 0353 Gross per 24 hour  Intake 391.55 ml  Output --  Net 391.55 ml      Physical Exam    General:  Frail/ weak appearing. Looks older than actual age No resp difficulty HEENT: normal Neck: supple. JVP not elevated . Carotids 2+ bilat; no bruits. No lymphadenopathy or thyromegaly appreciated. Cor: PMI nondisplaced. Regular rate & rhythm. No rubs, gallops or murmurs. Lungs: clear Abdomen: soft, nontender, nondistended. No hepatosplenomegaly. No bruits or masses. Good bowel sounds. Extremities: no cyanosis, clubbing, rash, edema Neuro: alert & orientedx3, cranial nerves grossly intact. moves all 4 extremities w/o difficulty. Affect pleasant   Telemetry   NSR 70s   EKG    Admit EKG NSR w/ ST abnormality   Labs   Basic Metabolic Panel: Recent Labs  Lab 02/09/21 2248 02/10/21 0034 02/10/21 0226 02/10/21 0900 02/10/21 1629 02/10/21 2048 02/11/21 0102 02/11/21 0914 02/12/21 0544  NA 117* 121* 121*   < > 128* 126* 126* 125* 123*  K 2.8*  --  3.1*  --  3.9  --   --  4.7 4.6  CL 87*  --  91*  --   --   --   --  98 93*  CO2 20*  --  20*  --   --   --   --  21* 22  GLUCOSE 134*  --  118*  --   --   --   --  92 93  BUN 5*  --  5*  --   --   --   --  <5* 8  CREATININE 0.77  --  0.76  --   --   --   --  0.78 0.76  CALCIUM 7.7*  --  7.9*  --   --   --   --  8.4* 8.5*  MG  --  1.6* 1.5*  --   --   --   --   --  1.9  PHOS  --   --  3.1  --   --   --   --   --   --    < > = values in this interval not displayed.    Liver Function Tests: Recent Labs  Lab 02/10/21 0226 02/11/21 0914 02/12/21 0544  AST 28 27 23   ALT 25 24 23   ALKPHOS 72 81 77  BILITOT 0.7 0.4 0.6  PROT 5.4* 5.9* 5.8*  ALBUMIN 3.1* 3.2* 3.1*   No results for input(s): LIPASE, AMYLASE in the last 168 hours. No results for input(s): AMMONIA in the last 168 hours.  CBC: Recent Labs  Lab  02/09/21 2248 02/10/21 0226 02/11/21 0914  WBC 4.1 3.8* 7.4  NEUTROABS  --   --  5.7  HGB 11.5* 10.6* 12.4  HCT 32.7* 30.9* 35.0*  MCV 91.3 92.8 91.1  PLT 282 256 279    Cardiac Enzymes: No results for input(s): CKTOTAL, CKMB, CKMBINDEX, TROPONINI in the last 168 hours.  BNP: BNP (last 3 results) Recent Labs    02/01/21 1100  BNP 792.9*    ProBNP (last 3 results) No results for input(s): PROBNP in the last 8760 hours.   CBG: No results for input(s): GLUCAP in the last 168 hours.  Coagulation Studies: No results for input(s): LABPROT, INR in the last 72 hours.   Imaging    No results found.   Medications:     Current Medications: . aspirin EC  81 mg Oral Daily  . atorvastatin  80 mg Oral Daily  . enoxaparin (LOVENOX) injection  20 mg Subcutaneous Q24H  . metoprolol succinate  12.5 mg Oral Daily  . ticagrelor  90 mg Oral BID     Infusions: . sodium chloride 50 mL/hr at 02/12/21 0919       Assessment/Plan   1. Hyponatremia  - Na 117 on admit. Due to hypovolemia in a setting of diarrhea-improving with IV fluids.  - 123 today   2. Hypokalemia - K 2.8 on admit, due to GI loses + diuretic use - improved w/ supplementation, 4.6 today   3. COVID-19 Infection - no respiratory symptoms - Remdesivir x 3 days   4. Diarrhea - suspect due to COVID 19  - GI panel and C-diff pending   5. CAD w/ Recent Anterior STEMI  - Admitted earlier this month w/ STEMI - cath w/ 99% pLAD occlusion treated w/ PCI/ DES + 80% dLAD to be treated medically - stable w/o CP. Admit EKG w/ no ischemic changes - c/w DAPT w/ ASA + Brilinta  - c/w statin   6. 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 No MR. RV normal.  - Required NE briefly previous admit for low BP but co-ox was ok. GDMT limited by low volume/ CVPs and soft BP - Now  readmitted w/ COVID and metabolic derangement in setting of diarrhea. Na low. CO low on admit. Hypotensive. ? Low output - place PICC line to check Co-ox and CVPs   - start midodrine 5 mg tid to support BP   7. H/o Lung Cancer - s/p chemo + radiation. Continues to smoke. CXR prior admit showed atraumatic rib fx. W/ hyponatremia, ? Recurrence  - consider chest CT. Differ w/u to primary team   Length of Stay: 2  Lyda Jester, [name]  02/12/2021, 12:26 PM  Advanced Heart Failure  Team Pager [phone] (M-F; 7a - 5p)  Please contact [facility] for night-coverage after hours (4p -7a ) and weekends on [email or web address]  Patient seen and examined with the above-signed Advanced Practice Provider and/or Housestaff. I personally reviewed laboratory data, imaging studies and relevant notes. I independently examined the patient and formulated the important aspects of the plan. I have edited the note to reflect any of my changes or salient points. I have personally discussed the plan with the patient and/or family.  65 y/o woman with COPD and recent cardiogenic shock in setting of large LAD infarct about 2 weeks ago. EF 25%. Now admitted with weakness, hypotension and severe electrolyte abnormalities. SBP 70s. Found to be covid +. Feels weak. Denies CP or SOB.   General:  NAD No resp difficulty. Lying flat HEENT: normal Neck: supple. no JVD. Carotids 2+ bilat; no bruits. No lymphadenopathy or thryomegaly appreciated. Cor: PMI nondisplaced. Regular rate & rhythm. No rubs, gallops or murmurs. Lungs: clear Abdomen: soft, nontender, nondistended. No hepatosplenomegaly. No bruits or masses. Good bowel sounds. Extremities: no cyanosis, clubbing, rash, edema Neuro: alert & orientedx3, cranial nerves grossly intact. moves all 4 extremities w/o difficulty. Affect pleasant  BP is low but does not appear to be in overt cardiogenic shock. Extremities are warm. Lungs clear.  Will give more IVFs. Add midodrine.  Repeat echo. Place PICC to check CVP and Co-ox. Continue DAPT.   Glori Bickers, MD  1:17 PM

## 2021-02-12 NOTE — Progress Notes (Signed)
OT NOTE  Pt screens on 5/11 via RN and PT without any new changes at this time. Ot will screen as pt is doing adls and self transfer to bathroom with RW. Energy conservation being covered by PT during last session.  Fleeta Emmer, OTR/L  Acute Rehabilitation Services Pager: [phone] Office: [phone] .

## 2021-02-12 NOTE — Progress Notes (Signed)
Peripherally Inserted Central Catheter Placement  The IV Nurse has discussed with the patient and/or persons authorized to consent for the patient, the purpose of this procedure and the potential benefits and risks involved with this procedure.  The benefits include less needle sticks, lab draws from the catheter, and the patient may be discharged home with the catheter. Risks include, but not limited to, infection, bleeding, blood clot (thrombus formation), and puncture of an artery; nerve damage and irregular heartbeat and possibility to perform a PICC exchange if needed/ordered by physician.  Alternatives to this procedure were also discussed.  B[employer] PICC patient education guide, fact sheet on infection prevention and patient information card has been provided to patient /or left at bedside.    PICC Placement Documentation  PICC Double Lumen 01/75/10 PICC Right Basilic 38 cm 2 cm (Active)  Indication for Insertion or Continuance of Line Chronic illness with exacerbations (CF, Sickle Cell, etc.) 02/12/21 1600  Exposed Catheter (cm) 2 cm 02/12/21 1600  Site Assessment Clean;Dry;Intact 02/12/21 1600  Lumen #1 Status Flushed;Blood return noted 02/12/21 1600  Lumen #2 Status Flushed;Blood return noted 02/12/21 1600  Dressing Type Transparent 02/12/21 1600  Dressing Status Clean;Dry;Intact 02/12/21 1600  Antimicrobial disc in place? Yes 02/12/21 1600  Dressing Change Due 02/19/21 02/12/21 1600       Nikaya, Nasby Horton 02/12/2021, 4:18 PM

## 2021-02-12 NOTE — Progress Notes (Signed)
Attempted echo, but patient getting central line placement at this time, then moving to a cardiac unit.

## 2021-02-13 ENCOUNTER — Inpatient Hospital Stay (HOSPITAL_COMMUNITY): Payer: Medicaid Other

## 2021-02-13 DIAGNOSIS — I5022 Chronic systolic (congestive) heart failure: Secondary | ICD-10-CM | POA: Diagnosis not present

## 2021-02-13 DIAGNOSIS — E876 Hypokalemia: Secondary | ICD-10-CM | POA: Diagnosis not present

## 2021-02-13 DIAGNOSIS — I5042 Chronic combined systolic (congestive) and diastolic (congestive) heart failure: Secondary | ICD-10-CM | POA: Insufficient documentation

## 2021-02-13 DIAGNOSIS — I2510 Atherosclerotic heart disease of native coronary artery without angina pectoris: Secondary | ICD-10-CM | POA: Diagnosis not present

## 2021-02-13 DIAGNOSIS — U071 COVID-19: Secondary | ICD-10-CM | POA: Diagnosis not present

## 2021-02-13 DIAGNOSIS — I5021 Acute systolic (congestive) heart failure: Secondary | ICD-10-CM | POA: Diagnosis not present

## 2021-02-13 DIAGNOSIS — E871 Hypo-osmolality and hyponatremia: Secondary | ICD-10-CM | POA: Diagnosis not present

## 2021-02-13 HISTORY — DX: Chronic combined systolic (congestive) and diastolic (congestive) heart failure: I50.42

## 2021-02-13 LAB — COMPREHENSIVE METABOLIC PANEL
ALT: 19 U/L (ref 0–44)
AST: 23 U/L (ref 15–41)
Albumin: 2.8 g/dL — ABNORMAL LOW (ref 3.5–5.0)
Alkaline Phosphatase: 66 U/L (ref 38–126)
Anion gap: 8 (ref 5–15)
BUN: 10 mg/dL (ref 8–23)
CO2: 21 mmol/L — ABNORMAL LOW (ref 22–32)
Calcium: 7.8 mg/dL — ABNORMAL LOW (ref 8.9–10.3)
Chloride: 96 mmol/L — ABNORMAL LOW (ref 98–111)
Creatinine, Ser: 0.69 mg/dL (ref 0.44–1.00)
GFR, Estimated: >60 mL/min (ref >60)
Glucose, Bld: 97 mg/dL (ref 70–99)
Potassium: 3.7 mmol/L (ref 3.5–5.1)
Sodium: 125 mmol/L — ABNORMAL LOW (ref 135–145)
Total Bilirubin: 0.7 mg/dL (ref 0.3–1.2)
Total Protein: 5.2 g/dL — ABNORMAL LOW (ref 6.5–8.1)

## 2021-02-13 LAB — ECHOCARDIOGRAM LIMITED
Calc EF: 27.3 %
S' Lateral: 2.7 cm
Single Plane A2C EF: 30.2 %
Single Plane A4C EF: 21.7 %
Weight: 1523.82 oz

## 2021-02-13 LAB — CORTISOL: Cortisol, Plasma: 10.7 ug/dL

## 2021-02-13 MED ORDER — MIDODRINE HCL 5 MG PO TABS
10.0000 mg | ORAL_TABLET | Freq: Three times a day (TID) | ORAL | Status: DC
  Administered 2021-02-13 – 2021-02-20 (×22): 10 mg via ORAL
  Filled 2021-02-13 (×22): qty 2

## 2021-02-13 NOTE — Progress Notes (Signed)
PROGRESS NOTE                                                                                                                                                                                                             Patient Demographics:    Colleen Fleming, is a 65 y.o. female, DOB - Oct 08, 1955, PNT:[id number]  Outpatient Primary MD for the patient is Belva Bertin, [place]   Admit date - 02/09/2021   LOS - 3  Chief Complaint  Patient presents with  . Weakness       Brief Narrative: Patient is a 65 y.o. female with PMHx of CAD-recent PCI, chronic systolic heart failure-presented with diarrhea-found to have severe hyponatremia.  Found to have COVID-19 infection.  COVID-19 vaccinated status:  Significant Events: 5/10>> Admit to [facility] for diarrhea-vit D-found to have hyponatremia-sodium 117.  Significant studies: 5/10>>Chest x-ray: No pneumonia  COVID-19 medications: Remdesivir:5/11>> 5/13  Antibiotics: None  Microbiology data: None  Procedures: None  Consults: Advanced heart failure team.  DVT prophylaxis: Place TED hose Start: 02/12/21 1320 Place and maintain sequential compression device Start: 02/10/21 0202    Subjective:   Lying comfortably in bed-denies any nausea vomiting.  She was lying flat.  Denies any shortness of breath.   Assessment  & Plan :   Hyponatremia: Due to hypervolemia in the setting of COVID-19 diarrhea-improved with IV fluids-but subsequently worsened due to excessive free water intake.  Developed hypotension on 5/13-given IV fluid-with some improvement in sodium overnight.  Has underlying chronic systolic heart failure-and we need to be cautious with hydration-however suspect she may have a little bit more room left for IV fluids.  We will await cardiology evaluation-but suspect at some point probably will require reinitiation of diuretic regimen-if her blood pressure permits-as  hyponatremia now could be related to her underlying CHF.  Have discussed with nursing staff-maintain oral fluid restriction.  Hypokalemia: Due to GI losses-resolved.  Diarrhea: Likely from COVID 19 infection-resolved.  COVID-19 infection: Likely causing diarrhea-on Remdesivir x3 days.  Hypotension: Asymptomatic-probably a combination of hypovolemia-and CHF.  On midodrine-BP still soft-increase midodrine to 10 mg.  Recent STEMI-s/p PCI: No anginal symptoms-on antiplatelets/statin/beta-blocker.  Chronic systolic heart failure (EF 25% on echo 5/2): Volume status stable-see above.  COVID-19 Labs: Recent Labs    02/10/21 1629 02/11/21 0914  DDIMER 1.29*  --  LDH 225*  --   CRP 0.6 0.7       Component Value Date/Time   BNP 792.9 (H) 02/01/2021 1100    Recent Labs  Lab 02/10/21 1629  PROCALCITON <0.10    Lab Results  Component Value Date   SARSCOV2NAA POSITIVE (A) 02/10/2021   SARSCOV2NAA NEGATIVE 01/30/2021     ABG:    Component Value Date/Time   TCO2 19 (L) 01/30/2021 1924   O2SAT 75.0 02/03/2021 0555    Vent Settings: N/A  Condition - Stable  Family Communication  : Son-Colleen [phone] updated over the phone on 5/14  Code Status :  Full Code  Diet :  Diet Order            Diet Heart Room service appropriate? Yes; Fluid consistency: Thin; Fluid restriction: 1200 mL Fluid  Diet effective now                  Disposition Plan  :   Status is: Inpatient  Remains inpatient appropriate because:Inpatient level of care appropriate due to severity of illness   Dispo:  Patient From: Home  Planned Disposition: Home  Medically stable for discharge: No      Barriers to discharge: Slowly improving hyponatremia-hypotension-see above documentation.  Antimicorbials  :    Anti-infectives (From admission, onward)   Start     Dose/Rate Route Frequency Ordered Stop   02/11/21 1000  remdesivir 100 mg in sodium chloride 0.9 % 100 mL IVPB       "Followed  by" Linked Group Details   100 mg 200 mL/hr over 30 Minutes Intravenous Daily 02/10/21 1410 02/12/21 0951   02/10/21 1515  remdesivir 200 mg in sodium chloride 0.9% 250 mL IVPB       "Followed by" Linked Group Details   200 mg 580 mL/hr over 30 Minutes Intravenous Once 02/10/21 1410 02/10/21 1733      Inpatient Medications  Scheduled Meds: . aspirin EC  81 mg Oral Daily  . atorvastatin  80 mg Oral Daily  . Chlorhexidine Gluconate Cloth  6 each Topical Daily  . enoxaparin (LOVENOX) injection  20 mg Subcutaneous Q24H  . midodrine  10 mg Oral TID WC  . sodium chloride flush  10-40 mL Intracatheter Q12H  . ticagrelor  90 mg Oral BID   Continuous Infusions:  PRN Meds:.acetaminophen, melatonin, nitroGLYCERIN, prochlorperazine, sodium chloride flush   Time Spent in minutes  35    See all Orders from today for further details   Oren Binet M.D on 02/13/2021 at 11:25 AM  To page go to [email or web address] - use universal password  [facility] -  Office  [phone]    Objective:   Vitals:   02/13/21 0143 02/13/21 0314 02/13/21 0531 02/13/21 0800  BP:      Pulse:      Resp:      Temp: (!) 97.4 F (36.3 C) 97.8 F (36.6 C) 97.7 F (36.5 C) 98.4 F (36.9 C)  TempSrc: Oral Oral Axillary Oral  SpO2:      Weight:        Wt Readings from Last 3 Encounters:  02/13/21 43.2 kg  02/04/21 43.4 kg  11/25/11 56.6 kg     Intake/Output Summary (Last 24 hours) at 02/13/2021 1125 Last data filed at 02/13/2021 0945 Gross per 24 hour  Intake 867.17 ml  Output 650 ml  Net 217.17 ml     Physical Exam Gen Exam:Alert awake-not in any distress HEENT:atraumatic, normocephalic Chest: B/L clear to  auscultation anteriorly CVS:S1S2 regular Abdomen:soft non tender, non distended Extremities:no edema Neurology: Non focal Skin: no rash  Data Review:    CBC Recent Labs  Lab 02/09/21 2248 02/10/21 0226 02/11/21 0914  WBC 4.1 3.8* 7.4  HGB 11.5* 10.6* 12.4  HCT 32.7*  30.9* 35.0*  PLT 282 256 279  MCV 91.3 92.8 91.1  MCH 32.1 31.8 32.3  MCHC 35.2 34.3 35.4  RDW 12.1 12.2 12.8  LYMPHSABS  --   --  0.9  MONOABS  --   --  0.7  EOSABS  --   --  0.0  BASOSABS  --   --  0.0    Chemistries  Recent Labs  Lab 02/10/21 0034 02/10/21 0226 02/10/21 0900 02/10/21 1629 02/10/21 2048 02/11/21 0102 02/11/21 0914 02/12/21 0544 02/12/21 2043 02/13/21 0319  NA 121* 121*   < > 128*   < > 126* 125* 123* 125* 125*  K  --  3.1*  --  3.9  --   --  4.7 4.6 4.2 3.7  CL  --  91*  --   --   --   --  98 93* 95* 96*  CO2  --  20*  --   --   --   --  21* 22 24 21*  GLUCOSE  --  118*  --   --   --   --  92 93 107* 97  BUN  --  5*  --   --   --   --  <5* 8 12 10   CREATININE  --  0.76  --   --   --   --  0.78 0.76 0.75 0.69  CALCIUM  --  7.9*  --   --   --   --  8.4* 8.5* 7.7* 7.8*  MG 1.6* 1.5*  --   --   --   --   --  1.9  --   --   AST  --  28  --   --   --   --  27 23  --  23  ALT  --  25  --   --   --   --  24 23  --  19  ALKPHOS  --  72  --   --   --   --  81 77  --  66  BILITOT  --  0.7  --   --   --   --  0.4 0.6  --  0.7   < > = values in this interval not displayed.   ------------------------------------------------------------------------------------------------------------------ No results for input(s): CHOL, HDL, LDLCALC, TRIG, CHOLHDL, LDLDIRECT in the last 72 hours.  Lab Results  Component Value Date   HGBA1C 5.4 01/31/2021   ------------------------------------------------------------------------------------------------------------------ No results for input(s): TSH, T4TOTAL, T3FREE, THYROIDAB in the last 72 hours.  Invalid input(s): FREET3 ------------------------------------------------------------------------------------------------------------------ No results for input(s): VITAMINB12, FOLATE, FERRITIN, TIBC, IRON, RETICCTPCT in the last 72 hours.  Coagulation profile No results for input(s): INR, PROTIME in the last 168 hours.  Recent  Labs    02/10/21 1629  DDIMER 1.29*    Cardiac Enzymes No results for input(s): CKMB, TROPONINI, MYOGLOBIN in the last 168 hours.  Invalid input(s): CK ------------------------------------------------------------------------------------------------------------------    Component Value Date/Time   BNP 792.9 (H) 02/01/2021 1100    Micro Results Recent Results (from the past 240 hour(s))  SARS CORONAVIRUS 2 (TAT 6-24 HRS) Nasopharyngeal Nasopharyngeal Swab     Status: Abnormal  Collection Time: 02/10/21 12:18 AM   Specimen: Nasopharyngeal Swab  Result Value Ref Range Status   SARS Coronavirus 2 POSITIVE (A) NEGATIVE Final    Comment: (NOTE) SARS-CoV-2 target nucleic acids are DETECTED.  The SARS-CoV-2 RNA is generally detectable in upper and lower respiratory specimens during the acute phase of infection. Positive results are indicative of the presence of SARS-CoV-2 RNA. Clinical correlation with patient history and other diagnostic information is  necessary to determine patient infection status. Positive results do not rule out bacterial infection or co-infection with other viruses.  The expected result is Negative.  Fact Sheet for Patients: SugarRoll.be  Fact Sheet for Healthcare Providers: https://www.woods-mathews.com/  This test is not yet approved or cleared by the [place] FDA and  has been authorized for detection and/or diagnosis of SARS-CoV-2 by FDA under an Emergency Use Authorization (EUA). This EUA will remain  in effect (meaning this test can be used) for the duration of the COVID-19 declaration under Section 564(b)(1) of the Act, 21 U. S.C. section 360bbb-3(b)(1), unless the authorization is terminated or revoked sooner.   Performed at [facility], [place] [address], [place], [place] [address]     Radiology Reports DG Chest 2 View  Result Date: 02/01/2021 CLINICAL DATA:  History of lung cancer EXAM:  CHEST - 2 VIEW COMPARISON:  Radiograph 11/25/2011 FINDINGS: Right IJ central venous catheter tip terminates at the right atrium. Telemetry leads overlie the chest. Chronically coarsened interstitial changes and bronchitic features. Bandlike opacity is seen in the left lung apex with some tenting of the left hemidiaphragm, could reflect subsegmental atelectasis or volume loss though underlying airspace disease or architectural distortion could have a similar appearance. No focal consolidative opacity is seen. The cardiomediastinal contours are unremarkable. Few coronary stents are noted. Age-indeterminate left sixth and seventh posterolateral left rib fractures. No other acute osseous or soft tissue abnormalities the chest wall. IMPRESSION: Chronically coarsened interstitial and bronchitic features. New bandlike opacity in left lung apex with tenting of the left hemidiaphragm. Could reflect scarring or atelectatic change with volume loss. Underlying airspace disease or mass is less favored though not fully excluded. Right IJ approach central venous catheter at the right atrium Posterolateral left sixth and seventh rib fractures, age indeterminate, correlate for point tenderness. Electronically Signed   By: Lovena Le M.D.   On: 02/01/2021 04:25   CARDIAC CATHETERIZATION  Result Date: 01/30/2021  The left ventricular ejection fraction is 45-50% by visual estimate. Mid to apical anterior hypokinesis.  LV end diastolic pressure is mildly elevated at 13 mmHg, with systolic 98 mmHg.  There is trivial (1+) mitral regurgitation.  --------------------------------------------  Culprit lesion: Prox LAD lesion is 99% stenosed. (Calcified, thrombotic)  A drug-eluting stent was successfully placed using a SYNERGY XD 2.50X16 -> postdilated 2.8 mm  Post intervention, there is a 0% residual stenosis.  Dist LAD lesion is 80% stenosed -> there is TIMI 0 flow to the apex, post PCI TIMI II flow, but still notable  irregularities distally, likely distal embolism.  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  Distal LAD has TIMI I 0 flow.  Successful DES PCI of the LAD using a synergy DES 2.5 mm x 16 mm postdilated to 2.8 mm.  Otherwise angiographically normal coronary arteries with exception of the apical LAD.  Mild mid to apical anterior hypokinesis on LV gram with mildly reduced EF of roughly 45%.  Upper limit of normal LVEDP.  Borderline hypotension with systolic pressures ranging in the 95 to 105 mmHg range -> no evidence of shock  Severe hypOkalemia, i-STAT potassium read is 2.1, labs from ER read is 2.7.  Resolution of ventricular bigeminy. RECOMMENDATION  Admit to CVICU  Will run 3 rounds of IV potassium and recheck in 3 hours.  Continue IV Aggrastat for another 1 hour post PCI.  Check 2D echocardiogram, A1c and lipid panel the morning.  High-dose high intensity statin  Will attempt to start low-dose beta-blocker given tachycardia, however with borderline pressures, may need to hold.  Cardiac rehab consult  Smoking cessation consult Glenetta Hew, MD  DG Chest [facility] 1 View  Result Date: 02/09/2021 CLINICAL DATA:  Weakness, history of recent STEMI EXAM: PORTABLE CHEST 1 VIEW COMPARISON:  01/31/2021 FINDINGS: Cardiac shadow is at the upper limits of normal in size. Prior right jugular central line has been removed in the interval. The lungs are well aerated without focal infiltrate or sizable effusion. Chronic band like density is noted in the left upper lobe medially consistent with prior radiation therapy. This is stable from a prior CT from 12/23/2020 IMPRESSION: No acute abnormality noted. Prior radiation change in the medial left upper lobe. Electronically Signed   By: Inez Catalina M.D.   On: 02/09/2021 23:34   ECHOCARDIOGRAM COMPLETE  Result Date: 01/31/2021    ECHOCARDIOGRAM REPORT   Patient Name:   Colleen Fleming  Date of Exam: 01/31/2021 Medical Rec #:  [id number]         Height:       58.0 in Accession #:    [id number]        Weight:       100.1 lb Date of Birth:  Jul 21, 1956         BSA:          1.357 m Patient Age:    22 years          BP:           133/91 mmHg Patient Gender: F                 HR:           73 bpm. Exam Location:  Inpatient Procedure: 2D Echo, Cardiac Doppler, Color Doppler and 3D Echo Indications:    Acute myocardial infarction  History:        Patient has no prior history of Echocardiogram examinations.                 Acute MI and CAD; Risk Factors:Current Smoker. GERD.  Sonographer:    Clayton Lefort RDCS (AE) Referring Phys: [place]  1. No left ventricular thrombus. Left ventricular ejection fraction, by estimation, is 25 to 30%. The left ventricle has severely decreased function. The left ventricle demonstrates regional wall motion abnormalities (see scoring diagram/findings for description). Left ventricular diastolic parameters are consistent with Grade II diastolic dysfunction (pseudonormalization). Elevated left atrial pressure. There is severe hypokinesis of the left ventricular, entire anteroseptal wall and anterior wall. There is mild dyskinesis of the left ventricular, apical anterior wall and apical segment.  2. Right ventricular systolic function is normal. The right ventricular size is normal. There is mildly elevated pulmonary artery systolic pressure.  3. A small pericardial effusion is present. The pericardial effusion is circumferential.  4. The mitral valve is normal in structure. No evidence of mitral valve regurgitation.  5. The aortic valve is normal in structure. Aortic valve regurgitation is  mild.  6. The inferior vena cava is normal in size with <50% respiratory variability, suggesting right atrial pressure of 8 mmHg. FINDINGS  Left Ventricle: No left ventricular thrombus. Left ventricular ejection fraction, by estimation, is 25 to 30%. The left ventricle has  severely decreased function. The left ventricle demonstrates regional wall motion abnormalities. Severe hypokinesis of the left ventricular, entire anteroseptal wall and anterior wall. Mild dyskinesis of the left ventricular, apical anterior wall and apical segment. 3D left ventricular ejection fraction analysis performed but not reported based on interpreter judgement due to suboptimal quality. The left ventricular internal cavity size was normal in size. There is no left ventricular hypertrophy. Left ventricular diastolic parameters are consistent with Grade II diastolic dysfunction (pseudonormalization). Elevated left atrial pressure.  LV Wall Scoring: The apical anterior segment and apex are dyskinetic. The anterior wall, entire anterior septum, apical lateral segment, mid inferoseptal segment, and apical inferior segment are hypokinetic. The antero-lateral wall, inferior wall, posterior wall, and basal inferoseptal segment are normal. Right Ventricle: The right ventricular size is normal. No increase in right ventricular wall thickness. Right ventricular systolic function is normal. There is mildly elevated pulmonary artery systolic pressure. The tricuspid regurgitant velocity is 2.63  m/s, and with an assumed right atrial pressure of 8 mmHg, the estimated right ventricular systolic pressure is 94.4 mmHg. Left Atrium: Left atrial size was normal in size. Right Atrium: Right atrial size was normal in size. Pericardium: A small pericardial effusion is present. The pericardial effusion is circumferential. Mitral Valve: The mitral valve is normal in structure. No evidence of mitral valve regurgitation. MV peak gradient, 2.7 mmHg. The mean mitral valve gradient is 1.0 mmHg. Tricuspid Valve: The tricuspid valve is normal in structure. Tricuspid valve regurgitation is mild. Aortic Valve: The aortic valve is normal in structure. Aortic valve regurgitation is mild. Aortic valve mean gradient measures 2.0 mmHg. Aortic  valve peak gradient measures 3.3 mmHg. Aortic valve area, by VTI measures 1.59 cm. Pulmonic Valve: The pulmonic valve was normal in structure. Pulmonic valve regurgitation is trivial. Aorta: The aortic root is normal in size and structure. Venous: The inferior vena cava is normal in size with less than 50% respiratory variability, suggesting right atrial pressure of 8 mmHg. IAS/Shunts: No atrial level shunt detected by color flow Doppler. Additional Comments: A venous catheter is visualized in the right atrium.  LEFT VENTRICLE PLAX 2D LVIDd:         3.80 cm  Diastology LVIDs:         2.30 cm  LV e' medial:    4.46 cm/s LV PW:         1.10 cm  LV E/e' medial:  11.5 LV IVS:        0.90 cm  LV e' lateral:   5.44 cm/s LVOT diam:     1.70 cm  LV E/e' lateral: 9.4 LV SV:         28 LV SV Index:   21 LVOT Area:     2.27 cm                          3D Volume EF:                         3D EF:        36 %  LV EDV:       97 ml                         LV ESV:       62 ml                         LV SV:        35 ml RIGHT VENTRICLE            IVC RV Basal diam:  2.50 cm    IVC diam: 1.80 cm RV S prime:     9.79 cm/s TAPSE (M-mode): 1.2 cm LEFT ATRIUM             Index       RIGHT ATRIUM          Index LA diam:        1.90 cm 1.40 cm/m  RA Area:     7.28 cm LA Vol (A2C):   23.0 ml 16.95 ml/m RA Volume:   13.40 ml 9.88 ml/m LA Vol (A4C):   23.2 ml 17.10 ml/m LA Biplane Vol: 24.4 ml 17.98 ml/m  AORTIC VALVE AV Area (Vmax):    1.65 cm AV Area (Vmean):   1.60 cm AV Area (VTI):     1.59 cm AV Vmax:           91.00 cm/s AV Vmean:          64.500 cm/s AV VTI:            0.176 m AV Peak Grad:      3.3 mmHg AV Mean Grad:      2.0 mmHg LVOT Vmax:         66.20 cm/s LVOT Vmean:        45.600 cm/s LVOT VTI:          0.123 m LVOT/AV VTI ratio: 0.70  AORTA Ao Root diam: 2.90 cm Ao Asc diam:  3.10 cm MITRAL VALVE               TRICUSPID VALVE MV Area (PHT): 3.77 cm    TR Peak grad:   27.7 mmHg MV Area VTI:    1.81 cm    TR Vmax:        263.00 cm/s MV Peak grad:  2.7 mmHg MV Mean grad:  1.0 mmHg    SHUNTS MV Vmax:       0.81 m/s    Systemic VTI:  0.12 m MV Vmean:      49.0 cm/s   Systemic Diam: 1.70 cm MV Decel Time: 201 msec MV E velocity: 51.40 cm/s MV A velocity: 41.60 cm/s MV E/A ratio:  1.24 Mihai Croitoru MD Electronically signed by Sanda Klein MD Signature Date/Time: 01/31/2021/12:09:51 PM    Final    ECHOCARDIOGRAM LIMITED  Result Date: 02/01/2021    ECHOCARDIOGRAM LIMITED REPORT   Patient Name:   Colleen Fleming Date of Exam: 02/01/2021 Medical Rec #:  [id number]     Height:       57.0 in Accession #:    [id number]    Weight:       97.0 lb Date of Birth:  1955/10/09     BSA:          1.322 m Patient Age:    3 years      BP:           94/64 mmHg  Patient Gender: F             HR:           92 bpm. Exam Location:  Inpatient Procedure: Limited Color Doppler, Cardiac Doppler, Color Doppler and Limited            Echo STAT ECHO Indications:    Pericardial effusion with hypotension. Assess for tamponade  History:        Patient has prior history of Echocardiogram examinations, most                 recent 01/31/2021. CAD and Previous Myocardial Infarction,                 Abnormal ECG and Left heart cath; Signs/Symptoms:Hypotension.                 Frequent falls with broken ribs. Lung cancer.  Sonographer:    Merrie Roof Referring Phys: [place] Comments: This was a limited echo to rule out tamponade. IMPRESSIONS  1. Left ventricular ejection fraction, by estimation, is 25%. The left ventricle has severely decreased function. The left ventricle demonstrates regional wall motion abnormalities with mid to apical anteroseptal/inferoseptal/anterior akinesis, apical lateral akinesis, and akinesis of the true apex. Consistent with large LAD territory MI. There is mild left ventricular hypertrophy.  2. Right ventricular systolic function is normal. The right ventricular size is normal.  3. Small to  moderate pericardial effusion primarily adjacent to the RV. The IVC collapses around 50% with respiration. Difficult to interpret respirometry due to frequent ectopy. RV diastolic collapse is not present. No overt pericardial tamponade. Pericardial effusion is similar to the prior study.  4. The inferior vena cava is normal in size with <50% respiratory variability, suggesting right atrial pressure of 8 mmHg.  5. Limited echo FINDINGS  Left Ventricle: Left ventricular ejection fraction, by estimation, is 25%. The left ventricle has severely decreased function. The left ventricle demonstrates regional wall motion abnormalities. The left ventricular internal cavity size was normal in size. There is mild left ventricular hypertrophy. Right Ventricle: The right ventricular size is normal. No increase in right ventricular wall thickness. Right ventricular systolic function is normal. Left Atrium: Left atrial size was normal in size. Right Atrium: Right atrial size was normal in size. Pericardium: Small to moderate pericardial effusion primarily adjacent to the RV. The IVC collapses around 50% with respiration. Difficult to interpret respirometry due to frequent ectopy. RV diastolic collapse is not present. No overt pericardial tamponade. Venous: The inferior vena cava is normal in size with less than 50% respiratory variability, suggesting right atrial pressure of 8 mmHg. IAS/Shunts: No atrial level shunt detected by color flow Doppler. IVC IVC diam: 2.00 cm Loralie Champagne MD Electronically signed by Loralie Champagne MD Signature Date/Time: 02/01/2021/4:32:21 PM    Final    [place] EKG SITE RITE  Result Date: 02/12/2021 If Site Rite image not attached, placement could not be confirmed due to current cardiac rhythm.  [place] EKG SITE RITE  Result Date: 02/12/2021 If Site Rite image not attached, placement could not be confirmed due to current cardiac rhythm.

## 2021-02-13 NOTE — Progress Notes (Signed)
Advanced Heart Failure Rounding Note   Subjective:    Patient had PICC placed but got dislodged.   Now on midodrine. No BPs charted this am.    Denies SOB, orthopnea or PND. Wants to go home     Objective:   Weight Range:  Vital Signs:   Temp:  [97.4 F (36.3 C)-98.4 F (36.9 C)] 98.4 F (36.9 C) (05/14 0800) Pulse Rate:  [66-77] 77 (05/13 1739) Resp:  [16-18] 16 (05/13 1739) BP: (80-114)/(56-72) 114/72 (05/13 1739) SpO2:  [96 %-97 %] 96 % (05/13 1739) Weight:  [41.6 kg-43.2 kg] 43.2 kg (05/14 0142) Last BM Date: 02/11/21  Weight change: Filed Weights   02/12/21 1400 02/12/21 1739 02/13/21 0142  Weight: 42.4 kg 41.6 kg 43.2 kg    Intake/Output:   Intake/Output Summary (Last 24 hours) at 02/13/2021 1119 Last data filed at 02/13/2021 0945 Gross per 24 hour  Intake 867.17 ml  Output 650 ml  Net 217.17 ml     Physical Exam: General:  Weak appearing. No resp difficulty HEENT: normal Neck: supple. JVP 8-9  . Carotids 2+ bilat; no bruits. No lymphadenopathy or thryomegaly appreciated. Cor: PMI nondisplaced. Regular rate & rhythm. No rubs, gallops or murmurs. Lungs: clear Abdomen: soft, nontender, nondistended. No hepatosplenomegaly. No bruits or masses. Good bowel sounds. Extremities: no cyanosis, clubbing, rash, edema Neuro: alert & orientedx3, cranial nerves grossly intact. moves all 4 extremities w/o difficulty. Affect pleasant  Telemetry:  Sinus 60 Personally reviewed  Labs: Basic Metabolic Panel: Recent Labs  Lab 02/10/21 0034 02/10/21 0226 02/10/21 0900 02/10/21 1629 02/10/21 2048 02/11/21 0102 02/11/21 0914 02/12/21 0544 02/12/21 2043 02/13/21 0319  NA 121* 121*   < > 128*   < > 126* 125* 123* 125* 125*  K  --  3.1*  --  3.9  --   --  4.7 4.6 4.2 3.7  CL  --  91*  --   --   --   --  98 93* 95* 96*  CO2  --  20*  --   --   --   --  21* 22 24 21*  GLUCOSE  --  118*  --   --   --   --  92 93 107* 97  BUN  --  5*  --   --   --   --  <5* 8 12  10   CREATININE  --  0.76  --   --   --   --  0.78 0.76 0.75 0.69  CALCIUM  --  7.9*  --   --   --   --  8.4* 8.5* 7.7* 7.8*  MG 1.6* 1.5*  --   --   --   --   --  1.9  --   --   PHOS  --  3.1  --   --   --   --   --   --   --   --    < > = values in this interval not displayed.    Liver Function Tests: Recent Labs  Lab 02/10/21 0226 02/11/21 0914 02/12/21 0544 02/13/21 0319  AST 28 27 23 23   ALT 25 24 23 19   ALKPHOS 72 81 77 66  BILITOT 0.7 0.4 0.6 0.7  PROT 5.4* 5.9* 5.8* 5.2*  ALBUMIN 3.1* 3.2* 3.1* 2.8*   No results for input(s): LIPASE, AMYLASE in the last 168 hours. No results for input(s): AMMONIA in the last 168 hours.  CBC: Recent Labs  Lab 02/09/21 2248 02/10/21 0226 02/11/21 0914  WBC 4.1 3.8* 7.4  NEUTROABS  --   --  5.7  HGB 11.5* 10.6* 12.4  HCT 32.7* 30.9* 35.0*  MCV 91.3 92.8 91.1  PLT 282 256 279    Cardiac Enzymes: No results for input(s): CKTOTAL, CKMB, CKMBINDEX, TROPONINI in the last 168 hours.  BNP: BNP (last 3 results) Recent Labs    02/01/21 1100  BNP 792.9*    ProBNP (last 3 results) No results for input(s): PROBNP in the last 8760 hours.    Other results:  Imaging: [place] EKG SITE RITE  Result Date: 02/12/2021 If Site Rite image not attached, placement could not be confirmed due to current cardiac rhythm.  [place] EKG SITE RITE  Result Date: 02/12/2021 If Site Rite image not attached, placement could not be confirmed due to current cardiac rhythm.     Medications:     Scheduled Medications: . aspirin EC  81 mg Oral Daily  . atorvastatin  80 mg Oral Daily  . Chlorhexidine Gluconate Cloth  6 each Topical Daily  . enoxaparin (LOVENOX) injection  20 mg Subcutaneous Q24H  . midodrine  5 mg Oral TID WC  . sodium chloride flush  10-40 mL Intracatheter Q12H  . ticagrelor  90 mg Oral BID     Infusions:   PRN Medications:  acetaminophen, melatonin, nitroGLYCERIN, prochlorperazine, sodium chloride flush   Assessment/Plan:     1. Hyponatremia  - Na 117 on admit. Due to hypovolemia in asetting of diarrhea-improving with IV fluids.  - 123 -> 125  today  - I think she is now volume replete. Would avoid more IVF.  - Restrict free water   2. Hypokalemia - K 2.8 on admit, due to GI loses + diuretic use - improved w/ supplementation, 3.7 today   3. COVID-19 Infection - no respiratory symptoms - Remdesivir x 3 days   4. Diarrhea - suspect due to COVID 19  - GI panel and C-diff pending per primary   5. CAD w/ Recent Anterior STEMI  - Admitted earlier this month w/ STEMI - cath w/ 99% pLAD occlusion treated w/ PCI/ DES + 80% dLAD to be treated medically - stable w/o CP. Admit EKG w/ no ischemic changes - c/w DAPT w/ ASA + Brilinta  - c/w statin   6. 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 No MR. RV normal.  - Required NE briefly previous admit for low BP but co-ox was ok. GDMT limited by low volume/ CVPs and soft BP - Now readmitted w/ COVID and metabolic derangement in setting of diarrhea. Na low. CO low on admit. Hypotensive. ? Low output - place PICC line to check Co-ox and CVPs   - Increase midodrine to 10 mg tid to support BP  - BP is low but does not appear to be in overt cardiogenic shock. Extremities are warm. Lungs clear.  Increase midodrine. Repeat echo. Place PICC to check CVP and Co-ox. Continue DAPT. In unable to get PICC can plan RHC on Monday   7. H/o Lung Cancer - s/p chemo + radiation. Continues to smoke. CXR prior admit showed atraumatic rib fx. W/ hyponatremia,   Length of Stay: 3   Glori Bickers MD 02/13/2021, 11:19 AM  Advanced Heart Failure Team Pager [phone] (M-F; 7a - 4p)  Please contact [facility]  for night-coverage after hours (4p -7a ) and weekends on [email or web address]

## 2021-02-13 NOTE — Progress Notes (Signed)
Physical Therapy Treatment Patient Details Name: Colleen Fleming MRN: [id number] DOB: [birth year] Today's Date: 02/13/2021    History of Present Illness Pt is a 65 y.o. F who presents with acute onset of diarrhea and resulting hyponatremia, hypokalemia, hypocalcemia. Found to be covid+. Recently admitted for ST elevation MI, CAD s/p PCI. Significant PMH: HFrEF 25%, tobacco use, HLD, COPD, lung CA.    PT Comments    [name] activity tolerance remains limited to short household distances. Pt reporting muscular fatigue and weakness as primary limiting factors, no SOB or increased work of breathing noted during session. Pt does demonstrate one LOB initially, but is otherwise stable with UE support of RW. PT encourages aggressive mobilization with staff assistance to aide in a return to the pt's baseline. PT continues to recommend discharge home with HHPT as the pt has 24/7 assistance from her son.  Follow Up Recommendations  Home health PT     Equipment Recommendations  None recommended by PT (pt owns RW already)    Recommendations for Other Services       Precautions / Restrictions Precautions Precautions: Fall;Other (comment) Precaution Comments: watch O2, covid+ Restrictions Weight Bearing Restrictions: No    Mobility  Bed Mobility Overal bed mobility: Modified Independent Bed Mobility: Sit to Supine;Supine to Sit           General bed mobility comments: increased time    Transfers Overall transfer level: Needs assistance Equipment used: Rolling walker (2 wheeled) Transfers: Sit to/from Stand Sit to Stand: Min guard         General transfer comment: definite need of RW, pt unable to stand with multiple attempts without device or physical assist  Ambulation/Gait Ambulation/Gait assistance: Min assist;Min guard Gait Distance (Feet): 25 Feet (additional trial of 15') Assistive device: Rolling walker (2 wheeled) Gait Pattern/deviations: Step-through pattern Gait  velocity: reduced Gait velocity interpretation: <1.8 ft/sec, indicate of risk for recurrent falls (gait speed possible effected by close quarters of hospital room) General Gait Details: pt with slowed step-through gait, one LOB leftward requiring minA to correct at start of ambulation, balance improves with increased distance   Stairs             Wheelchair Mobility    Modified Rankin (Stroke Patients Only)       Balance Overall balance assessment: Needs assistance Sitting-balance support: No upper extremity supported;Feet supported Sitting balance-Leahy Scale: Good     Standing balance support: Bilateral upper extremity supported;Single extremity supported Standing balance-Leahy Scale: Poor Standing balance comment: reliant on UE support of RW                            Cognition Arousal/Alertness: Awake/alert Behavior During Therapy: WFL for tasks assessed/performed Overall Cognitive Status: Within Functional Limits for tasks assessed                                        Exercises      General Comments General comments (skin integrity, edema, etc.): VSS on RA      Pertinent Vitals/Pain Pain Assessment: No/denies pain    Home Living                      Prior Function            PT Goals (current goals can now be found in the care  plan section) Acute Rehab PT Goals Patient Stated Goal: To return home Progress towards PT goals: Progressing toward goals    Frequency    Min 3X/week      PT Plan Current plan remains appropriate    Co-evaluation              AM-PAC PT "6 Clicks" Mobility   Outcome Measure  Help needed turning from your back to your side while in a flat bed without using bedrails?: None Help needed moving from lying on your back to sitting on the side of a flat bed without using bedrails?: None Help needed moving to and from a bed to a chair (including a wheelchair)?: A Little Help  needed standing up from a chair using your arms (e.g., wheelchair or bedside chair)?: A Little Help needed to walk in hospital room?: A Little Help needed climbing 3-5 steps with a railing? : A Little 6 Click Score: 20    End of Session   Activity Tolerance: Patient tolerated treatment well Patient left: in bed;with call bell/phone within reach;with bed alarm set Nurse Communication: Mobility status PT Visit Diagnosis: Unsteadiness on feet (R26.81);Other abnormalities of gait and mobility (R26.89)     Time: 4967-5916 PT Time Calculation (min) (ACUTE ONLY): 26 min  Charges:  $Gait Training: 8-22 mins $Therapeutic Activity: 8-22 mins                     Zenaida Niece, PT, DPT Acute Rehabilitation Pager: [place] 02/13/2021, 4:34 PM

## 2021-02-13 NOTE — Progress Notes (Signed)
Secure chat with Dr Sloan Leiter, new order to cancel PICC.

## 2021-02-13 NOTE — Progress Notes (Signed)
  Echocardiogram 2D Echocardiogram has been performed.  Colleen Fleming 02/13/2021, 2:58 PM

## 2021-02-13 NOTE — Plan of Care (Signed)

## 2021-02-14 DIAGNOSIS — E871 Hypo-osmolality and hyponatremia: Secondary | ICD-10-CM | POA: Diagnosis not present

## 2021-02-14 DIAGNOSIS — I959 Hypotension, unspecified: Secondary | ICD-10-CM

## 2021-02-14 DIAGNOSIS — E876 Hypokalemia: Secondary | ICD-10-CM | POA: Diagnosis not present

## 2021-02-14 DIAGNOSIS — U071 COVID-19: Secondary | ICD-10-CM | POA: Diagnosis not present

## 2021-02-14 DIAGNOSIS — I5022 Chronic systolic (congestive) heart failure: Secondary | ICD-10-CM | POA: Diagnosis not present

## 2021-02-14 LAB — COMPREHENSIVE METABOLIC PANEL
ALT: 20 U/L (ref 0–44)
AST: 25 U/L (ref 15–41)
Albumin: 2.9 g/dL — ABNORMAL LOW (ref 3.5–5.0)
Alkaline Phosphatase: 78 U/L (ref 38–126)
Anion gap: 8 (ref 5–15)
BUN: 8 mg/dL (ref 8–23)
CO2: 21 mmol/L — ABNORMAL LOW (ref 22–32)
Calcium: 8.3 mg/dL — ABNORMAL LOW (ref 8.9–10.3)
Chloride: 97 mmol/L — ABNORMAL LOW (ref 98–111)
Creatinine, Ser: 0.66 mg/dL (ref 0.44–1.00)
GFR, Estimated: >60 mL/min (ref >60)
Glucose, Bld: 96 mg/dL (ref 70–99)
Potassium: 3.7 mmol/L (ref 3.5–5.1)
Sodium: 126 mmol/L — ABNORMAL LOW (ref 135–145)
Total Bilirubin: 0.8 mg/dL (ref 0.3–1.2)
Total Protein: 5.6 g/dL — ABNORMAL LOW (ref 6.5–8.1)

## 2021-02-14 MED ORDER — SODIUM CHLORIDE 0.9% FLUSH
3.0000 mL | INTRAVENOUS | Status: DC | PRN
  Administered 2021-02-14: 3 mL via INTRAVENOUS

## 2021-02-14 MED ORDER — ASPIRIN 81 MG PO CHEW
81.0000 mg | CHEWABLE_TABLET | ORAL | Status: AC
  Administered 2021-02-15: 81 mg via ORAL
  Filled 2021-02-14: qty 1

## 2021-02-14 MED ORDER — SODIUM CHLORIDE 0.9% FLUSH
3.0000 mL | Freq: Two times a day (BID) | INTRAVENOUS | Status: DC
  Administered 2021-02-15: 3 mL via INTRAVENOUS

## 2021-02-14 MED ORDER — SODIUM CHLORIDE 0.9 % IV SOLN: INTRAVENOUS | Status: DC

## 2021-02-14 MED ORDER — SODIUM CHLORIDE 0.9 % IV SOLN: 250.0000 mL | INTRAVENOUS | Status: DC | PRN

## 2021-02-14 NOTE — Progress Notes (Signed)
Advanced Heart Failure Rounding Note   Subjective:    Patient had PICC placed but got dislodged.   Now on midodrine. BPs remain in 80s.   Denies CP or SOB. Wants to go home  Echo 02/13/21 EF 20-25% moderate pericardial effusion. No overt tamponade. Na 125 -> 126   Objective:   Weight Range:  Vital Signs:   Temp:  [97.5 F (36.4 C)-98.1 F (36.7 C)] 97.5 F (36.4 C) (05/15 0510) Pulse Rate:  [63-83] 80 (05/15 0510) Resp:  [20-22] 21 (05/15 0510) BP: (89-113)/(69-79) 89/75 (05/15 0510) SpO2:  [95 %-99 %] 98 % (05/15 0510) Weight:  [43.4 kg] 43.4 kg (05/15 0500) Last BM Date: 02/11/21  Weight change: Filed Weights   02/12/21 1739 02/13/21 0142 02/14/21 0500  Weight: 41.6 kg 43.2 kg 43.4 kg    Intake/Output:   Intake/Output Summary (Last 24 hours) at 02/14/2021 1239 Last data filed at 02/14/2021 0600 Gross per 24 hour  Intake 580 ml  Output 200 ml  Net 380 ml     Physical Exam: General:  Weak appearing. No resp difficulty HEENT: normal Neck: supple. no JVD. Carotids 2+ bilat; no bruits. No lymphadenopathy or thryomegaly appreciated. Cor: PMI nondisplaced. Regular rate & rhythm. No rubs, gallops or murmurs. Lungs: clear Abdomen: soft, nontender, nondistended. No hepatosplenomegaly. No bruits or masses. Good bowel sounds. Extremities: no cyanosis, clubbing, rash, edema Neuro: alert & orientedx3, cranial nerves grossly intact. moves all 4 extremities w/o difficulty. Affect pleasant  Telemetry:  Sinus 70-80s Personally reviewed  Labs: Basic Metabolic Panel: Recent Labs  Lab 02/10/21 0034 02/10/21 0226 02/10/21 0900 02/11/21 0914 02/12/21 0544 02/12/21 2043 02/13/21 0319 02/14/21 0550  NA 121* 121*   < > 125* 123* 125* 125* 126*  K  --  3.1*   < > 4.7 4.6 4.2 3.7 3.7  CL  --  91*  --  98 93* 95* 96* 97*  CO2  --  20*  --  21* 22 24 21* 21*  GLUCOSE  --  118*  --  92 93 107* 97 96  BUN  --  5*  --  <5* 8 12 10 8   CREATININE  --  0.76  --  0.78 0.76  0.75 0.69 0.66  CALCIUM  --  7.9*  --  8.4* 8.5* 7.7* 7.8* 8.3*  MG 1.6* 1.5*  --   --  1.9  --   --   --   PHOS  --  3.1  --   --   --   --   --   --    < > = values in this interval not displayed.    Liver Function Tests: Recent Labs  Lab 02/10/21 0226 02/11/21 0914 02/12/21 0544 02/13/21 0319 02/14/21 0550  AST 28 27 23 23 25   ALT 25 24 23 19 20   ALKPHOS 72 81 77 66 78  BILITOT 0.7 0.4 0.6 0.7 0.8  PROT 5.4* 5.9* 5.8* 5.2* 5.6*  ALBUMIN 3.1* 3.2* 3.1* 2.8* 2.9*   No results for input(s): LIPASE, AMYLASE in the last 168 hours. No results for input(s): AMMONIA in the last 168 hours.  CBC: Recent Labs  Lab 02/09/21 2248 02/10/21 0226 02/11/21 0914  WBC 4.1 3.8* 7.4  NEUTROABS  --   --  5.7  HGB 11.5* 10.6* 12.4  HCT 32.7* 30.9* 35.0*  MCV 91.3 92.8 91.1  PLT 282 256 279    Cardiac Enzymes: No results for input(s): CKTOTAL, CKMB, CKMBINDEX, TROPONINI in  the last 168 hours.  BNP: BNP (last 3 results) Recent Labs    02/01/21 1100  BNP 792.9*    ProBNP (last 3 results) No results for input(s): PROBNP in the last 8760 hours.    Other results:  Imaging: ECHOCARDIOGRAM LIMITED  Result Date: 02/13/2021    ECHOCARDIOGRAM LIMITED REPORT   Patient Name:   Colleen Fleming Date of Exam: 02/13/2021 Medical Rec #:  [id number]     Height:       57.0 in Accession #:    [id number]    Weight:       95.2 lb Date of Birth:  Aug 25, 1956     BSA:          1.312 m Patient Age:    65 years      BP:           114/72 mmHg Patient Gender: F             HR:           75 bpm. Exam Location:  Inpatient Procedure: 2D Echo, Cardiac Doppler and Color Doppler Indications:    CHF-Acute Systolic  History:        Patient has prior history of Echocardiogram examinations, most                 recent 02/01/2021. Previous Myocardial Infarction and CAD; Risk                 Factors:Dyslipidemia and Current Smoker. GERD.  Sonographer:    Clayton Lefort RDCS (AE) Referring Phys: [place]  1. Akinesis of the anteroseptal, apical and distal inferior walls with overall severe LV dysfunction; large, predominantly anterior pericardial effusion; respiratory flow variation across mitral valve and RA collapse suggestive of early tamponade physiology but no RV diastolic collapse and IVC not dilated and collapses. Compared to 02/01/21, pericardial effusion is larger.  2. Left ventricular ejection fraction, by estimation, is 20 to 25%. The left ventricle has severely decreased function. The left ventricle demonstrates regional wall motion abnormalities (see scoring diagram/findings for description). There is moderate left ventricular hypertrophy of the basal-septal segment.  3. Right ventricular systolic function is normal. The right ventricular size is normal.  4. Large pericardial effusion.  5. The mitral valve is normal in structure. No evidence of mitral valve regurgitation. No evidence of mitral stenosis.  6. The aortic valve is tricuspid. Aortic valve regurgitation is not visualized. No aortic stenosis is present.  7. The inferior vena cava is normal in size with greater than 50% respiratory variability, suggesting right atrial pressure of 3 mmHg. FINDINGS  Left Ventricle: Left ventricular ejection fraction, by estimation, is 20 to 25%. The left ventricle has severely decreased function. The left ventricle demonstrates regional wall motion abnormalities. The left ventricular internal cavity size was normal  in size. There is moderate left ventricular hypertrophy of the basal-septal segment. Right Ventricle: The right ventricular size is normal.Right ventricular systolic function is normal. Left Atrium: Left atrial size was normal in size. Right Atrium: Right atrial size was normal in size. Pericardium: A large pericardial effusion is present. Mitral Valve: The mitral valve is normal in structure. No evidence of mitral valve stenosis. Tricuspid Valve: The tricuspid valve is normal in structure.  Tricuspid valve regurgitation is trivial. No evidence of tricuspid stenosis. Aortic Valve: The aortic valve is tricuspid. Aortic valve regurgitation is not visualized. No aortic stenosis is present. Pulmonic Valve: The pulmonic valve was not well  visualized. No evidence of pulmonic stenosis. Aorta: The aortic root is normal in size and structure. Venous: The inferior vena cava is normal in size with greater than 50% respiratory variability, suggesting right atrial pressure of 3 mmHg.  Additional Comments: Akinesis of the anteroseptal, apical and distal inferior walls with overall severe LV dysfunction; large, predominantly anterior pericardial effusion; respiratory flow variation across mitral valve and RA collapse suggestive of early  tamponade physiology but no RV diastolic collapse and IVC not dilated and collapses. Compared to 02/01/21, pericardial effusion is larger. LEFT VENTRICLE PLAX 2D LVIDd:         3.50 cm LVIDs:         2.70 cm LV PW:         1.10 cm LV IVS:        1.40 cm LVOT diam:     1.80 cm LVOT Area:     2.54 cm  LV Volumes (MOD) LV vol d, MOD A2C: 54.7 ml LV vol d, MOD A4C: 54.4 ml LV vol s, MOD A2C: 38.2 ml LV vol s, MOD A4C: 42.6 ml LV SV MOD A2C:     16.5 ml LV SV MOD A4C:     54.4 ml LV SV MOD BP:      15.4 ml IVC IVC diam: 1.50 cm LEFT ATRIUM         Index LA diam:    3.00 cm 2.29 cm/m   AORTA Ao Root diam: 3.10 cm Ao Asc diam:  2.80 cm  SHUNTS Systemic Diam: 1.80 cm Kirk Ruths MD Electronically signed by Kirk Ruths MD Signature Date/Time: 02/13/2021/3:22:45 PM    Final    [place] EKG SITE RITE  Result Date: 02/12/2021 If Site Rite image not attached, placement could not be confirmed due to current cardiac rhythm.  [place] EKG SITE RITE  Result Date: 02/12/2021 If Site Rite image not attached, placement could not be confirmed due to current cardiac rhythm.    Medications:     Scheduled Medications: . aspirin EC  81 mg Oral Daily  . atorvastatin  80 mg Oral Daily  .  Chlorhexidine Gluconate Cloth  6 each Topical Daily  . enoxaparin (LOVENOX) injection  20 mg Subcutaneous Q24H  . midodrine  10 mg Oral TID WC  . sodium chloride flush  10-40 mL Intracatheter Q12H  . ticagrelor  90 mg Oral BID    Infusions:   PRN Medications: acetaminophen, melatonin, nitroGLYCERIN, prochlorperazine, sodium chloride flush   Assessment/Plan:   1. Hyponatremia  - Na 117 on admit. Due to hypovolemia in asetting of diarrhea-improving with IV fluids.  - 123 -> 125 -> 126 today  - I think she is now volume replete. Would avoid more IVF.  - Restrict free water   2. Hypokalemia - K 2.8 on admit, due to GI loses + diuretic use - improved w/ supplementation, 3.7 today   3. COVID-19 Infection - no respiratory symptoms - Remdesivir x 3 days   4. Diarrhea - suspect due to COVID 19  - GI panel and C-diff pending per primary   5. CAD w/ Recent Anterior STEMI  - Admitted earlier this month w/ STEMI - cath w/ 99% pLAD occlusion treated w/ PCI/ DES + 80% dLAD to be treated medically - stable w/o CP. Admit EKG w/ no ischemic changes - c/w DAPT w/ ASA + Brilinta  - c/w statin   6. 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 No MR. RV normal.  - Echo 02/13/21 EF 20-25% moderate pericardial effusion. No overt tamponade. - Remains on midodrine 10 mg tid to support BP  - BP is low but does not appear to be in overt cardiogenic shock. Extremities are warm. Lungs clear.PIC has been dislodged. Will plan RHC tomorrow to further assess hemodynamics  7. H/o Lung Cancer - s/p chemo + radiation. Continues to smoke. CXR prior admit showed atraumatic rib fx. W/ hyponatremia,   Length of Stay: 4   Glori Bickers MD 02/14/2021, 12:39 PM  Advanced Heart Failure Team Pager [phone] (M-F; [place])  Please contact [facility] for  night-coverage after hours (4p -7a ) and weekends on [email or web address]

## 2021-02-14 NOTE — Progress Notes (Signed)
PROGRESS NOTE                                                                                                                                                                                                             Patient Demographics:    Colleen Fleming, is a 65 y.o. female, DOB - 1955/12/05, PTW:[id number]  Outpatient Primary MD for the patient is Belva Bertin, [place]   Admit date - 02/09/2021   LOS - 4  Chief Complaint  Patient presents with  . Weakness       Brief Narrative: Patient is a 65 y.o. female with PMHx of CAD-recent PCI, chronic systolic heart failure-presented with diarrhea-found to have severe hyponatremia.  Found to have COVID-19 infection.  COVID-19 vaccinated status:  Significant Events: 5/10>> Admit to [facility] for diarrhea-vit D-found to have hyponatremia-sodium 117.  Significant studies: 5/10>>Chest x-ray: No pneumonia  COVID-19 medications: Remdesivir:5/11>> 5/13  Antibiotics: None  Microbiology data: None  Procedures: None  Consults: Advanced heart failure team.  DVT prophylaxis: Place TED hose Start: 02/12/21 1320 Place and maintain sequential compression device Start: 02/10/21 0202    Subjective:   Lying comfortably in bed-no chest pain or shortness of breath.  Frustrated that she is still hospitalized-and wants to go home.  Blood pressure remains soft-mostly in 70Y systolic range.   Assessment  & Plan :   Hyponatremia: Felt to be due to hypovolemia in the setting of COVID-19 related gastroenteritis/diarrhea.  Improved with IV fluids-she is now euvolemic-still with some amount of hyponatremia which probably is now due to CHF.  Soft blood pressure precludes the use of diuretics at this point.  Continue fluid restriction.  Hypokalemia: Due to GI losses-resolved.  Diarrhea: Likely from COVID 19 infection-resolved.  COVID-19 infection: Likely causing diarrhea-on  Remdesivir x3 days.  Hypotension: Asymptomatic-probably a combination of hypovolemia-and CHF.  BP remains soft in spite of being on midodrine.  Cardiology contemplating RHC tomorrow.  Recent STEMI-s/p PCI: No anginal symptoms-on antiplatelets/statin/beta-blocker.  Chronic systolic heart failure (EF 25% on echo 5/2): Volume status stable-see above.  COVID-19 Labs: No results for input(s): DDIMER, FERRITIN, LDH, CRP in the last 72 hours.     Component Value Date/Time   BNP 792.9 (H) 02/01/2021 1100    Recent Labs  Lab 02/10/21 1629  PROCALCITON <0.10    Lab Results  Component  Value Date   SARSCOV2NAA POSITIVE (A) 02/10/2021   [place] NEGATIVE 01/30/2021     ABG:    Component Value Date/Time   TCO2 19 (L) 01/30/2021 1924   O2SAT 75.0 02/03/2021 0555    Vent Settings: N/A  Condition - Stable  Family Communication  : [name] updated over the phone on 5/14  Code Status :  Full Code  Diet :  Diet Order            Diet Heart Room service appropriate? Yes; Fluid consistency: Thin; Fluid restriction: 1200 mL Fluid  Diet effective now                  Disposition Plan  :   Status is: Inpatient  Remains inpatient appropriate because:Inpatient level of care appropriate due to severity of illness   Dispo:  Patient From: Home  Planned Disposition: Home  Medically stable for discharge: No      Barriers to discharge: Slowly improving hyponatremia-hypotension-see above documentation.  Antimicorbials  :    Anti-infectives (From admission, onward)   Start     Dose/Rate Route Frequency Ordered Stop   02/11/21 1000  remdesivir 100 mg in sodium chloride 0.9 % 100 mL IVPB       "Followed by" Linked Group Details   100 mg 200 mL/hr over 30 Minutes Intravenous Daily 02/10/21 1410 02/12/21 0951   02/10/21 1515  remdesivir 200 mg in sodium chloride 0.9% 250 mL IVPB       "Followed by" Linked Group Details   200 mg 580 mL/hr over 30 Minutes  Intravenous Once 02/10/21 1410 02/10/21 1733      Inpatient Medications  Scheduled Meds: . aspirin EC  81 mg Oral Daily  . atorvastatin  80 mg Oral Daily  . Chlorhexidine Gluconate Cloth  6 each Topical Daily  . enoxaparin (LOVENOX) injection  20 mg Subcutaneous Q24H  . midodrine  10 mg Oral TID WC  . sodium chloride flush  10-40 mL Intracatheter Q12H  . ticagrelor  90 mg Oral BID   Continuous Infusions:  PRN Meds:.acetaminophen, melatonin, nitroGLYCERIN, prochlorperazine, sodium chloride flush   Time Spent in minutes  25    See all Orders from today for further details   Oren Binet M.D on 02/14/2021 at 10:10 AM  To page go to [email or web address] - use universal password  [facility] -  Office  [phone]    Objective:   Vitals:   02/14/21 0000 02/14/21 0200 02/14/21 0500 02/14/21 0510  BP: 104/79 108/69  (!) 89/75  Pulse: 83 74  80  Resp: 20 (!) 22  (!) 21  Temp: 98 F (36.7 C)   (!) 97.5 F (36.4 C)  TempSrc: Oral   Oral  SpO2: 95% 97%  98%  Weight:   43.4 kg     Wt Readings from Last 3 Encounters:  02/14/21 43.4 kg  02/04/21 43.4 kg  11/25/11 56.6 kg     Intake/Output Summary (Last 24 hours) at 02/14/2021 1010 Last data filed at 02/14/2021 0600 Gross per 24 hour  Intake 580 ml  Output 200 ml  Net 380 ml     Physical Exam Gen Exam:Alert awake-not in any distress HEENT:atraumatic, normocephalic Chest: B/L clear to auscultation anteriorly CVS:S1S2 regular Abdomen:soft non tender, non distended Extremities:no edema Neurology: Non focal Skin: no rash  Data Review:    CBC Recent Labs  Lab 02/09/21 2248 02/10/21 0226 02/11/21 0914  WBC 4.1 3.8* 7.4  HGB 11.5* 10.6* 12.4  HCT  32.7* 30.9* 35.0*  PLT 282 256 279  MCV 91.3 92.8 91.1  MCH 32.1 31.8 32.3  MCHC 35.2 34.3 35.4  RDW 12.1 12.2 12.8  LYMPHSABS  --   --  0.9  MONOABS  --   --  0.7  EOSABS  --   --  0.0  BASOSABS  --   --  0.0    Chemistries  Recent Labs  Lab  02/10/21 0034 02/10/21 0226 02/10/21 0900 02/11/21 0914 02/12/21 0544 02/12/21 2043 02/13/21 0319 02/14/21 0550  NA 121* 121*   < > 125* 123* 125* 125* 126*  K  --  3.1*   < > 4.7 4.6 4.2 3.7 3.7  CL  --  91*  --  98 93* 95* 96* 97*  CO2  --  20*  --  21* 22 24 21* 21*  GLUCOSE  --  118*  --  92 93 107* 97 96  BUN  --  5*  --  <5* 8 12 10 8   CREATININE  --  0.76  --  0.78 0.76 0.75 0.69 0.66  CALCIUM  --  7.9*  --  8.4* 8.5* 7.7* 7.8* 8.3*  MG 1.6* 1.5*  --   --  1.9  --   --   --   AST  --  28  --  27 23  --  23 25  ALT  --  25  --  24 23  --  19 20  ALKPHOS  --  72  --  81 77  --  66 78  BILITOT  --  0.7  --  0.4 0.6  --  0.7 0.8   < > = values in this interval not displayed.   ------------------------------------------------------------------------------------------------------------------ No results for input(s): CHOL, HDL, LDLCALC, TRIG, CHOLHDL, LDLDIRECT in the last 72 hours.  Lab Results  Component Value Date   HGBA1C 5.4 01/31/2021   ------------------------------------------------------------------------------------------------------------------ No results for input(s): TSH, T4TOTAL, T3FREE, THYROIDAB in the last 72 hours.  Invalid input(s): FREET3 ------------------------------------------------------------------------------------------------------------------ No results for input(s): VITAMINB12, FOLATE, FERRITIN, TIBC, IRON, RETICCTPCT in the last 72 hours.  Coagulation profile No results for input(s): INR, PROTIME in the last 168 hours.  No results for input(s): DDIMER in the last 72 hours.  Cardiac Enzymes No results for input(s): CKMB, TROPONINI, MYOGLOBIN in the last 168 hours.  Invalid input(s): CK ------------------------------------------------------------------------------------------------------------------    Component Value Date/Time   BNP 792.9 (H) 02/01/2021 1100    Micro Results Recent Results (from the past 240 hour(s))  SARS  CORONAVIRUS 2 (TAT 6-24 HRS) Nasopharyngeal Nasopharyngeal Swab     Status: Abnormal   Collection Time: 02/10/21 12:18 AM   Specimen: Nasopharyngeal Swab  Result Value Ref Range Status   SARS Coronavirus 2 POSITIVE (A) NEGATIVE Final    Comment: (NOTE) SARS-CoV-2 target nucleic acids are DETECTED.  The SARS-CoV-2 RNA is generally detectable in upper and lower respiratory specimens during the acute phase of infection. Positive results are indicative of the presence of SARS-CoV-2 RNA. Clinical correlation with patient history and other diagnostic information is  necessary to determine patient infection status. Positive results do not rule out bacterial infection or co-infection with other viruses.  The expected result is Negative.  Fact Sheet for Patients: SugarRoll.be  Fact Sheet for Healthcare Providers: https://www.woods-mathews.com/  This test is not yet approved or cleared by the [place] FDA and  has been authorized for detection and/or diagnosis of SARS-CoV-2 by FDA under an Emergency Use Authorization (EUA). This  EUA will remain  in effect (meaning this test can be used) for the duration of the COVID-19 declaration under Section 564(b)(1) of the Act, 21 U. S.C. section 360bbb-3(b)(1), unless the authorization is terminated or revoked sooner.   Performed at [facility], [place] [address] [place], [place] [address]     Radiology Reports DG Chest 2 View  Result Date: 02/01/2021 CLINICAL DATA:  History of lung cancer EXAM: CHEST - 2 VIEW COMPARISON:  Radiograph 11/25/2011 FINDINGS: Right IJ central venous catheter tip terminates at the right atrium. Telemetry leads overlie the chest. Chronically coarsened interstitial changes and bronchitic features. Bandlike opacity is seen in the left lung apex with some tenting of the left hemidiaphragm, could reflect subsegmental atelectasis or volume loss though underlying airspace disease  or architectural distortion could have a similar appearance. No focal consolidative opacity is seen. The cardiomediastinal contours are unremarkable. Few coronary stents are noted. Age-indeterminate left sixth and seventh posterolateral left rib fractures. No other acute osseous or soft tissue abnormalities the chest wall. IMPRESSION: Chronically coarsened interstitial and bronchitic features. New bandlike opacity in left lung apex with tenting of the left hemidiaphragm. Could reflect scarring or atelectatic change with volume loss. Underlying airspace disease or mass is less favored though not fully excluded. Right IJ approach central venous catheter at the right atrium Posterolateral left sixth and seventh rib fractures, age indeterminate, correlate for point tenderness. Electronically Signed   By: Lovena Le M.D.   On: 02/01/2021 04:25   CARDIAC CATHETERIZATION  Result Date: 01/30/2021  The left ventricular ejection fraction is 45-50% by visual estimate. Mid to apical anterior hypokinesis.  LV end diastolic pressure is mildly elevated at 13 mmHg, with systolic 98 mmHg.  There is trivial (1+) mitral regurgitation.  --------------------------------------------  Culprit lesion: Prox LAD lesion is 99% stenosed. (Calcified, thrombotic)  A drug-eluting stent was successfully placed using a SYNERGY XD 2.50X16 -> postdilated 2.8 mm  Post intervention, there is a 0% residual stenosis.  Dist LAD lesion is 80% stenosed -> there is TIMI 0 flow to the apex, post PCI TIMI II flow, but still notable irregularities distally, likely distal embolism.  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  Distal LAD has TIMI I 0 flow.  Successful DES PCI of the LAD using a synergy DES 2.5 mm x 16 mm postdilated to 2.8 mm.  Otherwise angiographically normal coronary arteries with exception of the apical LAD.  Mild mid to apical anterior hypokinesis  on LV gram with mildly reduced EF of roughly 45%.  Upper limit of normal LVEDP.  Borderline hypotension with systolic pressures ranging in the 95 to 105 mmHg range -> no evidence of shock  Severe hypOkalemia, i-STAT potassium read is 2.1, labs from ER read is 2.7.  Resolution of ventricular bigeminy. RECOMMENDATION  Admit to CVICU  Will run 3 rounds of IV potassium and recheck in 3 hours.  Continue IV Aggrastat for another 1 hour post PCI.  Check 2D echocardiogram, A1c and lipid panel the morning.  High-dose high intensity statin  Will attempt to start low-dose beta-blocker given tachycardia, however with borderline pressures, may need to hold.  Cardiac rehab consult  Smoking cessation consult Glenetta Hew, MD  DG Chest [facility] 1 View  Result Date: 02/09/2021 CLINICAL DATA:  Weakness, history of recent STEMI EXAM: PORTABLE CHEST 1 VIEW COMPARISON:  01/31/2021 FINDINGS: Cardiac shadow is at the upper limits of normal in size. Prior  right jugular central line has been removed in the interval. The lungs are well aerated without focal infiltrate or sizable effusion. Chronic band like density is noted in the left upper lobe medially consistent with prior radiation therapy. This is stable from a prior CT from 12/23/2020 IMPRESSION: No acute abnormality noted. Prior radiation change in the medial left upper lobe. Electronically Signed   By: Inez Catalina M.D.   On: 02/09/2021 23:34   ECHOCARDIOGRAM COMPLETE  Result Date: 01/31/2021    ECHOCARDIOGRAM REPORT   Patient Name:   ELYSE PREVO Date of Exam: 01/31/2021 Medical Rec #:  [id number]         Height:       58.0 in Accession #:    [id number]        Weight:       100.1 lb Date of Birth:  [birth year]         BSA:          1.357 m Patient Age:    8 years          BP:           133/91 mmHg Patient Gender: F                 HR:           73 bpm. Exam Location:  Inpatient Procedure: 2D Echo, Cardiac Doppler, Color Doppler and 3D Echo Indications:    Acute  myocardial infarction  History:        Patient has no prior history of Echocardiogram examinations.                 Acute MI and CAD; Risk Factors:Current Smoker. GERD.  Sonographer:    Clayton Lefort RDCS (AE) Referring Phys: [place]  1. No left ventricular thrombus. Left ventricular ejection fraction, by estimation, is 25 to 30%. The left ventricle has severely decreased function. The left ventricle demonstrates regional wall motion abnormalities (see scoring diagram/findings for description). Left ventricular diastolic parameters are consistent with Grade II diastolic dysfunction (pseudonormalization). Elevated left atrial pressure. There is severe hypokinesis of the left ventricular, entire anteroseptal wall and anterior wall. There is mild dyskinesis of the left ventricular, apical anterior wall and apical segment.  2. Right ventricular systolic function is normal. The right ventricular size is normal. There is mildly elevated pulmonary artery systolic pressure.  3. A small pericardial effusion is present. The pericardial effusion is circumferential.  4. The mitral valve is normal in structure. No evidence of mitral valve regurgitation.  5. The aortic valve is normal in structure. Aortic valve regurgitation is mild.  6. The inferior vena cava is normal in size with <50% respiratory variability, suggesting right atrial pressure of 8 mmHg. FINDINGS  Left Ventricle: No left ventricular thrombus. Left ventricular ejection fraction, by estimation, is 25 to 30%. The left ventricle has severely decreased function. The left ventricle demonstrates regional wall motion abnormalities. Severe hypokinesis of the left ventricular, entire anteroseptal wall and anterior wall. Mild dyskinesis of the left ventricular, apical anterior wall and apical segment. 3D left ventricular ejection fraction analysis performed but not reported based on interpreter judgement due to suboptimal quality. The left ventricular  internal cavity size was normal in size. There is no left ventricular hypertrophy. Left ventricular diastolic parameters are consistent with Grade II diastolic dysfunction (pseudonormalization). Elevated left atrial pressure.  LV Wall Scoring: The apical anterior segment and apex are dyskinetic. The anterior wall, entire anterior  septum, apical lateral segment, mid inferoseptal segment, and apical inferior segment are hypokinetic. The antero-lateral wall, inferior wall, posterior wall, and basal inferoseptal segment are normal. Right Ventricle: The right ventricular size is normal. No increase in right ventricular wall thickness. Right ventricular systolic function is normal. There is mildly elevated pulmonary artery systolic pressure. The tricuspid regurgitant velocity is 2.63  m/s, and with an assumed right atrial pressure of 8 mmHg, the estimated right ventricular systolic pressure is 67.1 mmHg. Left Atrium: Left atrial size was normal in size. Right Atrium: Right atrial size was normal in size. Pericardium: A small pericardial effusion is present. The pericardial effusion is circumferential. Mitral Valve: The mitral valve is normal in structure. No evidence of mitral valve regurgitation. MV peak gradient, 2.7 mmHg. The mean mitral valve gradient is 1.0 mmHg. Tricuspid Valve: The tricuspid valve is normal in structure. Tricuspid valve regurgitation is mild. Aortic Valve: The aortic valve is normal in structure. Aortic valve regurgitation is mild. Aortic valve mean gradient measures 2.0 mmHg. Aortic valve peak gradient measures 3.3 mmHg. Aortic valve area, by VTI measures 1.59 cm. Pulmonic Valve: The pulmonic valve was normal in structure. Pulmonic valve regurgitation is trivial. Aorta: The aortic root is normal in size and structure. Venous: The inferior vena cava is normal in size with less than 50% respiratory variability, suggesting right atrial pressure of 8 mmHg. IAS/Shunts: No atrial level shunt detected  by color flow Doppler. Additional Comments: A venous catheter is visualized in the right atrium.  LEFT VENTRICLE PLAX 2D LVIDd:         3.80 cm  Diastology LVIDs:         2.30 cm  LV e' medial:    4.46 cm/s LV PW:         1.10 cm  LV E/e' medial:  11.5 LV IVS:        0.90 cm  LV e' lateral:   5.44 cm/s LVOT diam:     1.70 cm  LV E/e' lateral: 9.4 LV SV:         28 LV SV Index:   21 LVOT Area:     2.27 cm                          3D Volume EF:                         3D EF:        36 %                         LV EDV:       97 ml                         LV ESV:       62 ml                         LV SV:        35 ml RIGHT VENTRICLE            IVC RV Basal diam:  2.50 cm    IVC diam: 1.80 cm RV S prime:     9.79 cm/s TAPSE (M-mode): 1.2 cm LEFT ATRIUM             Index       RIGHT ATRIUM  Index LA diam:        1.90 cm 1.40 cm/m  RA Area:     7.28 cm LA Vol (A2C):   23.0 ml 16.95 ml/m RA Volume:   13.40 ml 9.88 ml/m LA Vol (A4C):   23.2 ml 17.10 ml/m LA Biplane Vol: 24.4 ml 17.98 ml/m  AORTIC VALVE AV Area (Vmax):    1.65 cm AV Area (Vmean):   1.60 cm AV Area (VTI):     1.59 cm AV Vmax:           91.00 cm/s AV Vmean:          64.500 cm/s AV VTI:            0.176 m AV Peak Grad:      3.3 mmHg AV Mean Grad:      2.0 mmHg LVOT Vmax:         66.20 cm/s LVOT Vmean:        45.600 cm/s LVOT VTI:          0.123 m LVOT/AV VTI ratio: 0.70  AORTA Ao Root diam: 2.90 cm Ao Asc diam:  3.10 cm MITRAL VALVE               TRICUSPID VALVE MV Area (PHT): 3.77 cm    TR Peak grad:   27.7 mmHg MV Area VTI:   1.81 cm    TR Vmax:        263.00 cm/s MV Peak grad:  2.7 mmHg MV Mean grad:  1.0 mmHg    SHUNTS MV Vmax:       0.81 m/s    Systemic VTI:  0.12 m MV Vmean:      49.0 cm/s   Systemic Diam: 1.70 cm MV Decel Time: 201 msec MV E velocity: 51.40 cm/s MV A velocity: 41.60 cm/s MV E/A ratio:  1.24 Mihai Croitoru MD Electronically signed by Sanda Klein MD Signature Date/Time: 01/31/2021/12:09:51 PM    Final     ECHOCARDIOGRAM LIMITED  Result Date: 02/13/2021    ECHOCARDIOGRAM LIMITED REPORT   Patient Name:   NOBUKO GSELL Date of Exam: 02/13/2021 Medical Rec #:  [id number]     Height:       57.0 in Accession #:    [id number]    Weight:       95.2 lb Date of Birth:  03-Nov-1955     BSA:          1.312 m Patient Age:    9 years      BP:           114/72 mmHg Patient Gender: F             HR:           75 bpm. Exam Location:  Inpatient Procedure: 2D Echo, Cardiac Doppler and Color Doppler Indications:    CHF-Acute Systolic  History:        Patient has prior history of Echocardiogram examinations, most                 recent 02/01/2021. Previous Myocardial Infarction and CAD; Risk                 Factors:Dyslipidemia and Current Smoker. GERD.  Sonographer:    Clayton Lefort RDCS (AE) Referring Phys: [place]  1. Akinesis of the anteroseptal, apical and distal inferior walls with overall severe LV dysfunction; large, predominantly anterior pericardial effusion; respiratory flow variation across mitral valve  and RA collapse suggestive of early tamponade physiology but no RV diastolic collapse and IVC not dilated and collapses. Compared to 02/01/21, pericardial effusion is larger.  2. Left ventricular ejection fraction, by estimation, is 20 to 25%. The left ventricle has severely decreased function. The left ventricle demonstrates regional wall motion abnormalities (see scoring diagram/findings for description). There is moderate left ventricular hypertrophy of the basal-septal segment.  3. Right ventricular systolic function is normal. The right ventricular size is normal.  4. Large pericardial effusion.  5. The mitral valve is normal in structure. No evidence of mitral valve regurgitation. No evidence of mitral stenosis.  6. The aortic valve is tricuspid. Aortic valve regurgitation is not visualized. No aortic stenosis is present.  7. The inferior vena cava is normal in size with greater than 50%  respiratory variability, suggesting right atrial pressure of 3 mmHg. FINDINGS  Left Ventricle: Left ventricular ejection fraction, by estimation, is 20 to 25%. The left ventricle has severely decreased function. The left ventricle demonstrates regional wall motion abnormalities. The left ventricular internal cavity size was normal  in size. There is moderate left ventricular hypertrophy of the basal-septal segment. Right Ventricle: The right ventricular size is normal.Right ventricular systolic function is normal. Left Atrium: Left atrial size was normal in size. Right Atrium: Right atrial size was normal in size. Pericardium: A large pericardial effusion is present. Mitral Valve: The mitral valve is normal in structure. No evidence of mitral valve stenosis. Tricuspid Valve: The tricuspid valve is normal in structure. Tricuspid valve regurgitation is trivial. No evidence of tricuspid stenosis. Aortic Valve: The aortic valve is tricuspid. Aortic valve regurgitation is not visualized. No aortic stenosis is present. Pulmonic Valve: The pulmonic valve was not well visualized. No evidence of pulmonic stenosis. Aorta: The aortic root is normal in size and structure. Venous: The inferior vena cava is normal in size with greater than 50% respiratory variability, suggesting right atrial pressure of 3 mmHg.  Additional Comments: Akinesis of the anteroseptal, apical and distal inferior walls with overall severe LV dysfunction; large, predominantly anterior pericardial effusion; respiratory flow variation across mitral valve and RA collapse suggestive of early  tamponade physiology but no RV diastolic collapse and IVC not dilated and collapses. Compared to 02/01/21, pericardial effusion is larger. LEFT VENTRICLE PLAX 2D LVIDd:         3.50 cm LVIDs:         2.70 cm LV PW:         1.10 cm LV IVS:        1.40 cm LVOT diam:     1.80 cm LVOT Area:     2.54 cm  LV Volumes (MOD) LV vol d, MOD A2C: 54.7 ml LV vol d, MOD A4C: 54.4 ml LV  vol s, MOD A2C: 38.2 ml LV vol s, MOD A4C: 42.6 ml LV SV MOD A2C:     16.5 ml LV SV MOD A4C:     54.4 ml LV SV MOD BP:      15.4 ml IVC IVC diam: 1.50 cm LEFT ATRIUM         Index LA diam:    3.00 cm 2.29 cm/m   AORTA Ao Root diam: 3.10 cm Ao Asc diam:  2.80 cm  SHUNTS Systemic Diam: 1.80 cm Kirk Ruths MD Electronically signed by Kirk Ruths MD Signature Date/Time: 02/13/2021/3:22:45 PM    Final    ECHOCARDIOGRAM LIMITED  Result Date: 02/01/2021    ECHOCARDIOGRAM LIMITED REPORT   Patient Name:  DHRITHI RICHE Redinger Date of Exam: 02/01/2021 Medical Rec #:  [id number]     Height:       57.0 in Accession #:    [id number]    Weight:       97.0 lb Date of Birth:  [birth year]     BSA:          1.322 m Patient Age:    71 years      BP:           94/64 mmHg Patient Gender: F             HR:           92 bpm. Exam Location:  Inpatient Procedure: Limited Color Doppler, Cardiac Doppler, Color Doppler and Limited            Echo STAT ECHO Indications:    Pericardial effusion with hypotension. Assess for tamponade  History:        Patient has prior history of Echocardiogram examinations, most                 recent 01/31/2021. CAD and Previous Myocardial Infarction,                 Abnormal ECG and Left heart cath; Signs/Symptoms:Hypotension.                 Frequent falls with broken ribs. Lung cancer.  Sonographer:    Merrie Roof Referring Phys: [email or web address] Comments: This was a limited echo to rule out tamponade. IMPRESSIONS  1. Left ventricular ejection fraction, by estimation, is 25%. The left ventricle has severely decreased function. The left ventricle demonstrates regional wall motion abnormalities with mid to apical anteroseptal/inferoseptal/anterior akinesis, apical lateral akinesis, and akinesis of the true apex. Consistent with large LAD territory MI. There is mild left ventricular hypertrophy.  2. Right ventricular systolic function is normal. The right ventricular size is normal.  3. Small to  moderate pericardial effusion primarily adjacent to the RV. The IVC collapses around 50% with respiration. Difficult to interpret respirometry due to frequent ectopy. RV diastolic collapse is not present. No overt pericardial tamponade. Pericardial effusion is similar to the prior study.  4. The inferior vena cava is normal in size with <50% respiratory variability, suggesting right atrial pressure of 8 mmHg.  5. Limited echo FINDINGS  Left Ventricle: Left ventricular ejection fraction, by estimation, is 25%. The left ventricle has severely decreased function. The left ventricle demonstrates regional wall motion abnormalities. The left ventricular internal cavity size was normal in size. There is mild left ventricular hypertrophy. Right Ventricle: The right ventricular size is normal. No increase in right ventricular wall thickness. Right ventricular systolic function is normal. Left Atrium: Left atrial size was normal in size. Right Atrium: Right atrial size was normal in size. Pericardium: Small to moderate pericardial effusion primarily adjacent to the RV. The IVC collapses around 50% with respiration. Difficult to interpret respirometry due to frequent ectopy. RV diastolic collapse is not present. No overt pericardial tamponade. Venous: The inferior vena cava is normal in size with less than 50% respiratory variability, suggesting right atrial pressure of 8 mmHg. IAS/Shunts: No atrial level shunt detected by color flow Doppler. IVC IVC diam: 2.00 cm Loralie Champagne MD Electronically signed by Loralie Champagne MD Signature Date/Time: 02/01/2021/4:32:21 PM    Final    [place] EKG SITE RITE  Result Date: 02/12/2021 If Site Rite image not attached, placement could not be confirmed due  to current cardiac rhythm.  [place] EKG SITE RITE  Result Date: 02/12/2021 If Site Rite image not attached, placement could not be confirmed due to current cardiac rhythm.

## 2021-02-15 ENCOUNTER — Encounter (HOSPITAL_COMMUNITY): Payer: Self-pay | Admitting: Internal Medicine

## 2021-02-15 ENCOUNTER — Encounter (HOSPITAL_COMMUNITY)
Admission: EM | Disposition: A | Discharge status: Skilled Nursing Facility | Payer: Self-pay | Source: Home / Self Care | Attending: Internal Medicine

## 2021-02-15 DIAGNOSIS — I5022 Chronic systolic (congestive) heart failure: Secondary | ICD-10-CM | POA: Diagnosis not present

## 2021-02-15 DIAGNOSIS — U071 COVID-19: Secondary | ICD-10-CM | POA: Diagnosis not present

## 2021-02-15 DIAGNOSIS — I959 Hypotension, unspecified: Secondary | ICD-10-CM | POA: Diagnosis not present

## 2021-02-15 DIAGNOSIS — E876 Hypokalemia: Secondary | ICD-10-CM | POA: Diagnosis not present

## 2021-02-15 DIAGNOSIS — E871 Hypo-osmolality and hyponatremia: Secondary | ICD-10-CM | POA: Diagnosis not present

## 2021-02-15 HISTORY — PX: RIGHT HEART CATH: CATH118263

## 2021-02-15 LAB — POCT I-STAT EG7
Acid-base deficit: 1 mmol/L (ref 0.0–2.0)
Acid-base deficit: 1 mmol/L (ref 0.0–2.0)
Bicarbonate: 22.6 mmol/L (ref 20.0–28.0)
Bicarbonate: 23.3 mmol/L (ref 20.0–28.0)
Calcium, Ion: 1.14 mmol/L — ABNORMAL LOW (ref 1.15–1.40)
Calcium, Ion: 1.19 mmol/L (ref 1.15–1.40)
HCT: 34 % — ABNORMAL LOW (ref 36.0–46.0)
HCT: 34 % — ABNORMAL LOW (ref 36.0–46.0)
Hemoglobin: 11.6 g/dL — ABNORMAL LOW (ref 12.0–15.0)
Hemoglobin: 11.6 g/dL — ABNORMAL LOW (ref 12.0–15.0)
O2 Saturation: 64 %
O2 Saturation: 65 %
Potassium: 3.3 mmol/L — ABNORMAL LOW (ref 3.5–5.1)
Potassium: 3.4 mmol/L — ABNORMAL LOW (ref 3.5–5.1)
Sodium: 129 mmol/L — ABNORMAL LOW (ref 135–145)
Sodium: 130 mmol/L — ABNORMAL LOW (ref 135–145)
TCO2: 24 mmol/L (ref 22–32)
TCO2: 24 mmol/L (ref 22–32)
pCO2, Ven: 35.2 mmHg — ABNORMAL LOW (ref 44.0–60.0)
pCO2, Ven: 36.3 mmHg — ABNORMAL LOW (ref 44.0–60.0)
pH, Ven: 7.416 (ref 7.250–7.430)
pH, Ven: 7.416 (ref 7.250–7.430)
pO2, Ven: 32 mmHg (ref 32.0–45.0)
pO2, Ven: 33 mmHg (ref 32.0–45.0)

## 2021-02-15 LAB — COMPREHENSIVE METABOLIC PANEL
ALT: 22 U/L (ref 0–44)
AST: 29 U/L (ref 15–41)
Albumin: 2.9 g/dL — ABNORMAL LOW (ref 3.5–5.0)
Alkaline Phosphatase: 84 U/L (ref 38–126)
Anion gap: 8 (ref 5–15)
BUN: 7 mg/dL — ABNORMAL LOW (ref 8–23)
CO2: 22 mmol/L (ref 22–32)
Calcium: 8.4 mg/dL — ABNORMAL LOW (ref 8.9–10.3)
Chloride: 98 mmol/L (ref 98–111)
Creatinine, Ser: 0.68 mg/dL (ref 0.44–1.00)
GFR, Estimated: >60 mL/min (ref >60)
Glucose, Bld: 95 mg/dL (ref 70–99)
Potassium: 3.5 mmol/L (ref 3.5–5.1)
Sodium: 128 mmol/L — ABNORMAL LOW (ref 135–145)
Total Bilirubin: 0.9 mg/dL (ref 0.3–1.2)
Total Protein: 5.6 g/dL — ABNORMAL LOW (ref 6.5–8.1)

## 2021-02-15 SURGERY — RIGHT HEART CATH
Anesthesia: LOCAL

## 2021-02-15 MED ORDER — HEPARIN (PORCINE) IN NACL 1000-0.9 UT/500ML-% IV SOLN
INTRAVENOUS | Status: DC | PRN
  Administered 2021-02-15: 500 mL

## 2021-02-15 MED ORDER — LIDOCAINE HCL (PF) 1 % IJ SOLN
INTRAMUSCULAR | Status: DC | PRN
  Administered 2021-02-15: 2 mL

## 2021-02-15 MED ORDER — HEPARIN (PORCINE) IN NACL 1000-0.9 UT/500ML-% IV SOLN
INTRAVENOUS | Status: AC
  Filled 2021-02-15: qty 500

## 2021-02-15 MED ORDER — LIDOCAINE HCL (PF) 1 % IJ SOLN
INTRAMUSCULAR | Status: AC
  Filled 2021-02-15: qty 30

## 2021-02-15 SURGICAL SUPPLY — 9 items
CATH SWAN GANZ 7F STRAIGHT (CATHETERS) ×2 IMPLANT
GLIDESHEATH SLENDER 7FR .021G (SHEATH) ×2 IMPLANT
GUIDEWIRE .025 260CM (WIRE) ×2 IMPLANT
KIT MICROPUNCTURE NIT STIFF (SHEATH) ×2 IMPLANT
PACK CARDIAC CATHETERIZATION (CUSTOM PROCEDURE TRAY) ×2 IMPLANT
SHEATH PROBE COVER 6X72 (BAG) ×2 IMPLANT
TRANSDUCER W/STOPCOCK (MISCELLANEOUS) ×2 IMPLANT
TUBING ART PRESS 72  MALE/MALE (TUBING) ×2 IMPLANT
WIRE EMERALD 3MM-J .025X260CM (WIRE) ×2 IMPLANT

## 2021-02-15 NOTE — Progress Notes (Signed)
PROGRESS NOTE                                                                                                                                                                                                             Patient Demographics:    Colleen Fleming, is a 65 y.o. female, DOB - 1956/06/20, EBR:[id number]  Outpatient Primary MD for the patient is Belva Bertin, [place]   Admit date - 02/09/2021   LOS - 5  Chief Complaint  Patient presents with  . Weakness       Brief Narrative: Patient is a 65 y.o. female with PMHx of CAD-recent PCI, chronic systolic heart failure-presented with diarrhea-found to have severe hyponatremia.  Found to have COVID-19 infection.  COVID-19 vaccinated status:  Significant Events: 5/10>> Admit to [facility] for diarrhea-vit D-found to have hyponatremia-sodium 117.  Significant studies: 5/10>>Chest x-ray: No pneumonia  COVID-19 medications: Remdesivir:5/11>> 5/13  Antibiotics: None  Microbiology data: None  Procedures: None  Consults: Advanced heart failure team.  DVT prophylaxis: Place TED hose Start: 02/12/21 1320 Place and maintain sequential compression device Start: 02/10/21 0202    Subjective:   No chest pain or shortness of breath-lying comfortably in bed.   Assessment  & Plan :   Hyponatremia: Felt to be due to hypovolemia in the setting of COVID-19 related gastroenteritis/diarrhea.  She improved with IV fluids-she is now on just fluid restriction with slow improvement in her sodium levels have been up to 128 today.  Suspect that hyponatremia now more likely due to her underlying CHF.    Hypokalemia: Due to GI losses-resolved.  Diarrhea: Likely from COVID 19 infection-resolved.  COVID-19 infection: Likely causing diarrhea-on Remdesivir x3 days.  Hypotension: Asymptomatic-probably a combination of hypovolemia-and CHF.  BP remains soft in spite of being on  midodrine.  Cardiology planning on RHC today.  Recent STEMI-s/p PCI: No anginal symptoms-on antiplatelets/statin  Chronic systolic heart failure (EF 25% on echo 5/2): Volume status stable-see above.  COVID-19 Labs: No results for input(s): DDIMER, FERRITIN, LDH, CRP in the last 72 hours.     Component Value Date/Time   BNP 792.9 (H) 02/01/2021 1100    Recent Labs  Lab 02/10/21 1629  PROCALCITON <0.10    Lab Results  Component Value Date   SARSCOV2NAA POSITIVE (A) 02/10/2021   [place] NEGATIVE 01/30/2021  ABG:    Component Value Date/Time   TCO2 19 (L) 01/30/2021 1924   O2SAT 75.0 02/03/2021 0555    Vent Settings: N/A  Condition - Stable  Family Communication  : Son-[name] updated over the phone on 5/14  Code Status :  Full Code  Diet :  Diet Order            Diet NPO time specified Except for: Sips with Meds  Diet effective midnight                  Disposition Plan  :   Status is: Inpatient  Remains inpatient appropriate because:Inpatient level of care appropriate due to severity of illness   Dispo:  Patient From: Home  Planned Disposition: Home  Medically stable for discharge: No      Barriers to discharge: For RHC today  Antimicorbials  :    Anti-infectives (From admission, onward)   Start     Dose/Rate Route Frequency Ordered Stop   02/11/21 1000  remdesivir 100 mg in sodium chloride 0.9 % 100 mL IVPB       "Followed by" Linked Group Details   100 mg 200 mL/hr over 30 Minutes Intravenous Daily 02/10/21 1410 02/12/21 0951   02/10/21 1515  remdesivir 200 mg in sodium chloride 0.9% 250 mL IVPB       "Followed by" Linked Group Details   200 mg 580 mL/hr over 30 Minutes Intravenous Once 02/10/21 1410 02/10/21 1733      Inpatient Medications  Scheduled Meds: . aspirin EC  81 mg Oral Daily  . atorvastatin  80 mg Oral Daily  . Chlorhexidine Gluconate Cloth  6 each Topical Daily  . enoxaparin (LOVENOX) injection  20  mg Subcutaneous Q24H  . midodrine  10 mg Oral TID WC  . sodium chloride flush  10-40 mL Intracatheter Q12H  . sodium chloride flush  3 mL Intravenous Q12H  . ticagrelor  90 mg Oral BID   Continuous Infusions: . sodium chloride    . sodium chloride 10 mL/hr at 02/15/21 0511   PRN Meds:.sodium chloride, acetaminophen, melatonin, nitroGLYCERIN, prochlorperazine, sodium chloride flush, sodium chloride flush   Time Spent in minutes  25    See all Orders from today for further details   Oren Binet M.D on 02/15/2021 at 10:54 AM  To page go to [email or web address] - use universal password  [facility] -  Office  [phone]    Objective:   Vitals:   02/15/21 0200 02/15/21 0400 02/15/21 0817 02/15/21 1040  BP: 103/68 92/66 102/66 97/67  Pulse: 73 73  69  Resp: 19 19  20   Temp:  98.1 F (36.7 C) (!) 97.5 F (36.4 C) 98.1 F (36.7 C)  TempSrc:  Oral Oral Oral  SpO2: 96% 96%  98%  Weight:        Wt Readings from Last 3 Encounters:  02/15/21 40.1 kg  02/04/21 43.4 kg  11/25/11 56.6 kg     Intake/Output Summary (Last 24 hours) at 02/15/2021 1054 Last data filed at 02/15/2021 0805 Gross per 24 hour  Intake 364.65 ml  Output 400 ml  Net -35.35 ml     Physical Exam Gen Exam:Alert awake-not in any distress HEENT:atraumatic, normocephalic Chest: B/L clear to auscultation anteriorly CVS:S1S2 regular Abdomen:soft non tender, non distended Extremities:no edema Neurology: Non focal Skin: no rash   Data Review:    CBC Recent Labs  Lab 02/09/21 2248 02/10/21 0226 02/11/21 0914  WBC 4.1 3.8* 7.4  HGB 11.5* 10.6* 12.4  HCT 32.7* 30.9* 35.0*  PLT 282 256 279  MCV 91.3 92.8 91.1  MCH 32.1 31.8 32.3  MCHC 35.2 34.3 35.4  RDW 12.1 12.2 12.8  LYMPHSABS  --   --  0.9  MONOABS  --   --  0.7  EOSABS  --   --  0.0  BASOSABS  --   --  0.0    Chemistries  Recent Labs  Lab 02/09/21 2248 02/10/21 0034 02/10/21 0226 02/10/21 0900 02/11/21 0914 02/12/21 0544  02/12/21 2043 02/13/21 0319 02/14/21 0550 02/15/21 0650  NA  --  121* 121*   < > 125* 123* 125* 125* 126* 128*  K  --   --  3.1*   < > 4.7 4.6 4.2 3.7 3.7 3.5  CL  --   --  91*  --  98 93* 95* 96* 97* 98  CO2  --   --  20*  --  21* 22 24 21* 21* 22  GLUCOSE  --   --  118*  --  92 93 107* 97 96 95  BUN  --   --  5*  --  <5* 8 12 10 8  7*  CREATININE  --   --  0.76  --  0.78 0.76 0.75 0.69 0.66 0.68  CALCIUM  --   --  7.9*  --  8.4* 8.5* 7.7* 7.8* 8.3* 8.4*  MG  --  1.6* 1.5*  --   --  1.9  --   --   --   --   AST   < >  --  28  --  27 23  --  23 25 29   ALT   < >  --  25  --  24 23  --  19 20 22   ALKPHOS   < >  --  72  --  81 77  --  66 78 84  BILITOT   < >  --  0.7  --  0.4 0.6  --  0.7 0.8 0.9   < > = values in this interval not displayed.   ------------------------------------------------------------------------------------------------------------------ No results for input(s): CHOL, HDL, LDLCALC, TRIG, CHOLHDL, LDLDIRECT in the last 72 hours.  Lab Results  Component Value Date   HGBA1C 5.4 01/31/2021   ------------------------------------------------------------------------------------------------------------------ No results for input(s): TSH, T4TOTAL, T3FREE, THYROIDAB in the last 72 hours.  Invalid input(s): FREET3 ------------------------------------------------------------------------------------------------------------------ No results for input(s): VITAMINB12, FOLATE, FERRITIN, TIBC, IRON, RETICCTPCT in the last 72 hours.  Coagulation profile No results for input(s): INR, PROTIME in the last 168 hours.  No results for input(s): DDIMER in the last 72 hours.  Cardiac Enzymes No results for input(s): CKMB, TROPONINI, MYOGLOBIN in the last 168 hours.  Invalid input(s): CK ------------------------------------------------------------------------------------------------------------------    Component Value Date/Time   BNP 792.9 (H) 02/01/2021 1100    Micro  Results Recent Results (from the past 240 hour(s))  SARS CORONAVIRUS 2 (TAT 6-24 HRS) Nasopharyngeal Nasopharyngeal Swab     Status: Abnormal   Collection Time: 02/10/21 12:18 AM   Specimen: Nasopharyngeal Swab  Result Value Ref Range Status   SARS Coronavirus 2 POSITIVE (A) NEGATIVE Final    Comment: (NOTE) SARS-CoV-2 target nucleic acids are DETECTED.  The SARS-CoV-2 RNA is generally detectable in upper and lower respiratory specimens during the acute phase of infection. Positive results are indicative of the presence of SARS-CoV-2 RNA. Clinical correlation with patient history and other diagnostic information is  necessary to determine patient  infection status. Positive results do not rule out bacterial infection or co-infection with other viruses.  The expected result is Negative.  Fact Sheet for Patients: SugarRoll.be  Fact Sheet for Healthcare Providers: https://www.woods-mathews.com/  This test is not yet approved or cleared by the [place] FDA and  has been authorized for detection and/or diagnosis of SARS-CoV-2 by FDA under an Emergency Use Authorization (EUA). This EUA will remain  in effect (meaning this test can be used) for the duration of the COVID-19 declaration under Section 564(b)(1) of the Act, 21 U. S.C. section 360bbb-3(b)(1), unless the authorization is terminated or revoked sooner.   Performed at [facility], [place] [address] [place], [place] [address]     Radiology Reports DG Chest 2 View  Result Date: 02/01/2021 CLINICAL DATA:  History of lung cancer EXAM: CHEST - 2 VIEW COMPARISON:  Radiograph 11/25/2011 FINDINGS: Right IJ central venous catheter tip terminates at the right atrium. Telemetry leads overlie the chest. Chronically coarsened interstitial changes and bronchitic features. Bandlike opacity is seen in the left lung apex with some tenting of the left hemidiaphragm, could reflect subsegmental  atelectasis or volume loss though underlying airspace disease or architectural distortion could have a similar appearance. No focal consolidative opacity is seen. The cardiomediastinal contours are unremarkable. Few coronary stents are noted. Age-indeterminate left sixth and seventh posterolateral left rib fractures. No other acute osseous or soft tissue abnormalities the chest wall. IMPRESSION: Chronically coarsened interstitial and bronchitic features. New bandlike opacity in left lung apex with tenting of the left hemidiaphragm. Could reflect scarring or atelectatic change with volume loss. Underlying airspace disease or mass is less favored though not fully excluded. Right IJ approach central venous catheter at the right atrium Posterolateral left sixth and seventh rib fractures, age indeterminate, correlate for point tenderness. Electronically Signed   By: Lovena Le M.D.   On: 02/01/2021 04:25   CARDIAC CATHETERIZATION  Result Date: 01/30/2021  The left ventricular ejection fraction is 45-50% by visual estimate. Mid to apical anterior hypokinesis.  LV end diastolic pressure is mildly elevated at 13 mmHg, with systolic 98 mmHg.  There is trivial (1+) mitral regurgitation.  --------------------------------------------  Culprit lesion: Prox LAD lesion is 99% stenosed. (Calcified, thrombotic)  A drug-eluting stent was successfully placed using a SYNERGY XD 2.50X16 -> postdilated 2.8 mm  Post intervention, there is a 0% residual stenosis.  Dist LAD lesion is 80% stenosed -> there is TIMI 0 flow to the apex, post PCI TIMI II flow, but still notable irregularities distally, likely distal embolism.  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  Distal LAD has TIMI I 0 flow.  Successful DES PCI of the LAD using a synergy DES 2.5 mm x 16 mm postdilated to 2.8 mm.  Otherwise angiographically normal coronary arteries with exception  of the apical LAD.  Mild mid to apical anterior hypokinesis on LV gram with mildly reduced EF of roughly 45%.  Upper limit of normal LVEDP.  Borderline hypotension with systolic pressures ranging in the 95 to 105 mmHg range -> no evidence of shock  Severe hypOkalemia, i-STAT potassium read is 2.1, labs from ER read is 2.7.  Resolution of ventricular bigeminy. RECOMMENDATION  Admit to CVICU  Will run 3 rounds of IV potassium and recheck in 3 hours.  Continue IV Aggrastat for another 1 hour post PCI.  Check 2D echocardiogram, A1c and lipid panel the morning.  High-dose high intensity  statin  Will attempt to start low-dose beta-blocker given tachycardia, however with borderline pressures, may need to hold.  Cardiac rehab consult  Smoking cessation consult Glenetta Hew, MD  DG Chest [facility] 1 View  Result Date: 02/09/2021 CLINICAL DATA:  Weakness, history of recent STEMI EXAM: PORTABLE CHEST 1 VIEW COMPARISON:  01/31/2021 FINDINGS: Cardiac shadow is at the upper limits of normal in size. Prior right jugular central line has been removed in the interval. The lungs are well aerated without focal infiltrate or sizable effusion. Chronic band like density is noted in the left upper lobe medially consistent with prior radiation therapy. This is stable from a prior CT from 12/23/2020 IMPRESSION: No acute abnormality noted. Prior radiation change in the medial left upper lobe. Electronically Signed   By: Inez Catalina M.D.   On: 02/09/2021 23:34   ECHOCARDIOGRAM COMPLETE  Result Date: 01/31/2021    ECHOCARDIOGRAM REPORT   Patient Name:   SEEMA BLUM Date of Exam: 01/31/2021 Medical Rec #:  [id number]         Height:       58.0 in Accession #:    [id number]        Weight:       100.1 lb Date of Birth:  [birth year]         BSA:          1.357 m Patient Age:    36 years          BP:           133/91 mmHg Patient Gender: F                 HR:           73 bpm. Exam Location:  Inpatient Procedure: 2D Echo, Cardiac  Doppler, Color Doppler and 3D Echo Indications:    Acute myocardial infarction  History:        Patient has no prior history of Echocardiogram examinations.                 Acute MI and CAD; Risk Factors:Current Smoker. GERD.  Sonographer:    Clayton Lefort RDCS (AE) Referring Phys: [place]  1. No left ventricular thrombus. Left ventricular ejection fraction, by estimation, is 25 to 30%. The left ventricle has severely decreased function. The left ventricle demonstrates regional wall motion abnormalities (see scoring diagram/findings for description). Left ventricular diastolic parameters are consistent with Grade II diastolic dysfunction (pseudonormalization). Elevated left atrial pressure. There is severe hypokinesis of the left ventricular, entire anteroseptal wall and anterior wall. There is mild dyskinesis of the left ventricular, apical anterior wall and apical segment.  2. Right ventricular systolic function is normal. The right ventricular size is normal. There is mildly elevated pulmonary artery systolic pressure.  3. A small pericardial effusion is present. The pericardial effusion is circumferential.  4. The mitral valve is normal in structure. No evidence of mitral valve regurgitation.  5. The aortic valve is normal in structure. Aortic valve regurgitation is mild.  6. The inferior vena cava is normal in size with <50% respiratory variability, suggesting right atrial pressure of 8 mmHg. FINDINGS  Left Ventricle: No left ventricular thrombus. Left ventricular ejection fraction, by estimation, is 25 to 30%. The left ventricle has severely decreased function. The left ventricle demonstrates regional wall motion abnormalities. Severe hypokinesis of the left ventricular, entire anteroseptal wall and anterior wall. Mild dyskinesis of the left ventricular, apical anterior wall and apical segment.  3D left ventricular ejection fraction analysis performed but not reported based on interpreter  judgement due to suboptimal quality. The left ventricular internal cavity size was normal in size. There is no left ventricular hypertrophy. Left ventricular diastolic parameters are consistent with Grade II diastolic dysfunction (pseudonormalization). Elevated left atrial pressure.  LV Wall Scoring: The apical anterior segment and apex are dyskinetic. The anterior wall, entire anterior septum, apical lateral segment, mid inferoseptal segment, and apical inferior segment are hypokinetic. The antero-lateral wall, inferior wall, posterior wall, and basal inferoseptal segment are normal. Right Ventricle: The right ventricular size is normal. No increase in right ventricular wall thickness. Right ventricular systolic function is normal. There is mildly elevated pulmonary artery systolic pressure. The tricuspid regurgitant velocity is 2.63  m/s, and with an assumed right atrial pressure of 8 mmHg, the estimated right ventricular systolic pressure is 00.9 mmHg. Left Atrium: Left atrial size was normal in size. Right Atrium: Right atrial size was normal in size. Pericardium: A small pericardial effusion is present. The pericardial effusion is circumferential. Mitral Valve: The mitral valve is normal in structure. No evidence of mitral valve regurgitation. MV peak gradient, 2.7 mmHg. The mean mitral valve gradient is 1.0 mmHg. Tricuspid Valve: The tricuspid valve is normal in structure. Tricuspid valve regurgitation is mild. Aortic Valve: The aortic valve is normal in structure. Aortic valve regurgitation is mild. Aortic valve mean gradient measures 2.0 mmHg. Aortic valve peak gradient measures 3.3 mmHg. Aortic valve area, by VTI measures 1.59 cm. Pulmonic Valve: The pulmonic valve was normal in structure. Pulmonic valve regurgitation is trivial. Aorta: The aortic root is normal in size and structure. Venous: The inferior vena cava is normal in size with less than 50% respiratory variability, suggesting right atrial  pressure of 8 mmHg. IAS/Shunts: No atrial level shunt detected by color flow Doppler. Additional Comments: A venous catheter is visualized in the right atrium.  LEFT VENTRICLE PLAX 2D LVIDd:         3.80 cm  Diastology LVIDs:         2.30 cm  LV e' medial:    4.46 cm/s LV PW:         1.10 cm  LV E/e' medial:  11.5 LV IVS:        0.90 cm  LV e' lateral:   5.44 cm/s LVOT diam:     1.70 cm  LV E/e' lateral: 9.4 LV SV:         28 LV SV Index:   21 LVOT Area:     2.27 cm                          3D Volume EF:                         3D EF:        36 %                         LV EDV:       97 ml                         LV ESV:       62 ml                         LV SV:  35 ml RIGHT VENTRICLE            IVC RV Basal diam:  2.50 cm    IVC diam: 1.80 cm RV S prime:     9.79 cm/s TAPSE (M-mode): 1.2 cm LEFT ATRIUM             Index       RIGHT ATRIUM          Index LA diam:        1.90 cm 1.40 cm/m  RA Area:     7.28 cm LA Vol (A2C):   23.0 ml 16.95 ml/m RA Volume:   13.40 ml 9.88 ml/m LA Vol (A4C):   23.2 ml 17.10 ml/m LA Biplane Vol: 24.4 ml 17.98 ml/m  AORTIC VALVE AV Area (Vmax):    1.65 cm AV Area (Vmean):   1.60 cm AV Area (VTI):     1.59 cm AV Vmax:           91.00 cm/s AV Vmean:          64.500 cm/s AV VTI:            0.176 m AV Peak Grad:      3.3 mmHg AV Mean Grad:      2.0 mmHg LVOT Vmax:         66.20 cm/s LVOT Vmean:        45.600 cm/s LVOT VTI:          0.123 m LVOT/AV VTI ratio: 0.70  AORTA Ao Root diam: 2.90 cm Ao Asc diam:  3.10 cm MITRAL VALVE               TRICUSPID VALVE MV Area (PHT): 3.77 cm    TR Peak grad:   27.7 mmHg MV Area VTI:   1.81 cm    TR Vmax:        263.00 cm/s MV Peak grad:  2.7 mmHg MV Mean grad:  1.0 mmHg    SHUNTS MV Vmax:       0.81 m/s    Systemic VTI:  0.12 m MV Vmean:      49.0 cm/s   Systemic Diam: 1.70 cm MV Decel Time: 201 msec MV E velocity: 51.40 cm/s MV A velocity: 41.60 cm/s MV E/A ratio:  1.24 Mihai Croitoru MD Electronically signed by Sanda Klein MD  Signature Date/Time: 01/31/2021/12:09:51 PM    Final    ECHOCARDIOGRAM LIMITED  Result Date: 02/13/2021    ECHOCARDIOGRAM LIMITED REPORT   Patient Name:   KATALEA UCCI Date of Exam: 02/13/2021 Medical Rec #:  [id number]     Height:       57.0 in Accession #:    [id number]    Weight:       95.2 lb Date of Birth:  Jul 20, 1956     BSA:          1.312 m Patient Age:    56 years      BP:           114/72 mmHg Patient Gender: F             HR:           75 bpm. Exam Location:  Inpatient Procedure: 2D Echo, Cardiac Doppler and Color Doppler Indications:    CHF-Acute Systolic  History:        Patient has prior history of Echocardiogram examinations, most  recent 02/01/2021. Previous Myocardial Infarction and CAD; Risk                 Factors:Dyslipidemia and Current Smoker. GERD.  Sonographer:    Clayton Lefort RDCS (AE) Referring Phys: [place]  1. Akinesis of the anteroseptal, apical and distal inferior walls with overall severe LV dysfunction; large, predominantly anterior pericardial effusion; respiratory flow variation across mitral valve and RA collapse suggestive of early tamponade physiology but no RV diastolic collapse and IVC not dilated and collapses. Compared to 02/01/21, pericardial effusion is larger.  2. Left ventricular ejection fraction, by estimation, is 20 to 25%. The left ventricle has severely decreased function. The left ventricle demonstrates regional wall motion abnormalities (see scoring diagram/findings for description). There is moderate left ventricular hypertrophy of the basal-septal segment.  3. Right ventricular systolic function is normal. The right ventricular size is normal.  4. Large pericardial effusion.  5. The mitral valve is normal in structure. No evidence of mitral valve regurgitation. No evidence of mitral stenosis.  6. The aortic valve is tricuspid. Aortic valve regurgitation is not visualized. No aortic stenosis is present.  7. The inferior vena  cava is normal in size with greater than 50% respiratory variability, suggesting right atrial pressure of 3 mmHg. FINDINGS  Left Ventricle: Left ventricular ejection fraction, by estimation, is 20 to 25%. The left ventricle has severely decreased function. The left ventricle demonstrates regional wall motion abnormalities. The left ventricular internal cavity size was normal  in size. There is moderate left ventricular hypertrophy of the basal-septal segment. Right Ventricle: The right ventricular size is normal.Right ventricular systolic function is normal. Left Atrium: Left atrial size was normal in size. Right Atrium: Right atrial size was normal in size. Pericardium: A large pericardial effusion is present. Mitral Valve: The mitral valve is normal in structure. No evidence of mitral valve stenosis. Tricuspid Valve: The tricuspid valve is normal in structure. Tricuspid valve regurgitation is trivial. No evidence of tricuspid stenosis. Aortic Valve: The aortic valve is tricuspid. Aortic valve regurgitation is not visualized. No aortic stenosis is present. Pulmonic Valve: The pulmonic valve was not well visualized. No evidence of pulmonic stenosis. Aorta: The aortic root is normal in size and structure. Venous: The inferior vena cava is normal in size with greater than 50% respiratory variability, suggesting right atrial pressure of 3 mmHg.  Additional Comments: Akinesis of the anteroseptal, apical and distal inferior walls with overall severe LV dysfunction; large, predominantly anterior pericardial effusion; respiratory flow variation across mitral valve and RA collapse suggestive of early  tamponade physiology but no RV diastolic collapse and IVC not dilated and collapses. Compared to 02/01/21, pericardial effusion is larger. LEFT VENTRICLE PLAX 2D LVIDd:         3.50 cm LVIDs:         2.70 cm LV PW:         1.10 cm LV IVS:        1.40 cm LVOT diam:     1.80 cm LVOT Area:     2.54 cm  LV Volumes (MOD) LV vol d,  MOD A2C: 54.7 ml LV vol d, MOD A4C: 54.4 ml LV vol s, MOD A2C: 38.2 ml LV vol s, MOD A4C: 42.6 ml LV SV MOD A2C:     16.5 ml LV SV MOD A4C:     54.4 ml LV SV MOD BP:      15.4 ml IVC IVC diam: 1.50 cm LEFT ATRIUM  Index LA diam:    3.00 cm 2.29 cm/m   AORTA Ao Root diam: 3.10 cm Ao Asc diam:  2.80 cm  SHUNTS Systemic Diam: 1.80 cm Kirk Ruths MD Electronically signed by Kirk Ruths MD Signature Date/Time: 02/13/2021/3:22:45 PM    Final    ECHOCARDIOGRAM LIMITED  Result Date: 02/01/2021    ECHOCARDIOGRAM LIMITED REPORT   Patient Name:   RAYLAN TROIANI Date of Exam: 02/01/2021 Medical Rec #:  [id number]     Height:       57.0 in Accession #:    [id number]    Weight:       97.0 lb Date of Birth:  06-Aug-1956     BSA:          1.322 m Patient Age:    41 years      BP:           94/64 mmHg Patient Gender: F             HR:           92 bpm. Exam Location:  Inpatient Procedure: Limited Color Doppler, Cardiac Doppler, Color Doppler and Limited            Echo STAT ECHO Indications:    Pericardial effusion with hypotension. Assess for tamponade  History:        Patient has prior history of Echocardiogram examinations, most                 recent 01/31/2021. CAD and Previous Myocardial Infarction,                 Abnormal ECG and Left heart cath; Signs/Symptoms:Hypotension.                 Frequent falls with broken ribs. Lung cancer.  Sonographer:    Merrie Roof Referring Phys: [place] Comments: This was a limited echo to rule out tamponade. IMPRESSIONS  1. Left ventricular ejection fraction, by estimation, is 25%. The left ventricle has severely decreased function. The left ventricle demonstrates regional wall motion abnormalities with mid to apical anteroseptal/inferoseptal/anterior akinesis, apical lateral akinesis, and akinesis of the true apex. Consistent with large LAD territory MI. There is mild left ventricular hypertrophy.  2. Right ventricular systolic function is normal. The right  ventricular size is normal.  3. Small to moderate pericardial effusion primarily adjacent to the RV. The IVC collapses around 50% with respiration. Difficult to interpret respirometry due to frequent ectopy. RV diastolic collapse is not present. No overt pericardial tamponade. Pericardial effusion is similar to the prior study.  4. The inferior vena cava is normal in size with <50% respiratory variability, suggesting right atrial pressure of 8 mmHg.  5. Limited echo FINDINGS  Left Ventricle: Left ventricular ejection fraction, by estimation, is 25%. The left ventricle has severely decreased function. The left ventricle demonstrates regional wall motion abnormalities. The left ventricular internal cavity size was normal in size. There is mild left ventricular hypertrophy. Right Ventricle: The right ventricular size is normal. No increase in right ventricular wall thickness. Right ventricular systolic function is normal. Left Atrium: Left atrial size was normal in size. Right Atrium: Right atrial size was normal in size. Pericardium: Small to moderate pericardial effusion primarily adjacent to the RV. The IVC collapses around 50% with respiration. Difficult to interpret respirometry due to frequent ectopy. RV diastolic collapse is not present. No overt pericardial tamponade. Venous: The inferior vena cava is normal in size  with less than 50% respiratory variability, suggesting right atrial pressure of 8 mmHg. IAS/Shunts: No atrial level shunt detected by color flow Doppler. IVC IVC diam: 2.00 cm Loralie Champagne MD Electronically signed by Loralie Champagne MD Signature Date/Time: 02/01/2021/4:32:21 PM    Final    [place] EKG SITE RITE  Result Date: 02/12/2021 If Site Rite image not attached, placement could not be confirmed due to current cardiac rhythm.  [place] EKG SITE RITE  Result Date: 02/12/2021 If Site Rite image not attached, placement could not be confirmed due to current cardiac rhythm.

## 2021-02-15 NOTE — Progress Notes (Addendum)
Advanced Heart Failure Rounding Note   Subjective:    Patient had PICC placed but got dislodged.   Now on midodrine. SBPs low 100s.   Na up to 128 today.   Echo 02/13/21 EF 20-25% moderate pericardial effusion. No overt tamponade.   On for RHC today.   Feels weak. Denies SOB    Objective:   Weight Range:  Vital Signs:   Temp:  [97.5 F (36.4 C)-98.5 F (36.9 C)] 97.5 F (36.4 C) (05/16 0817) Pulse Rate:  [73-87] 73 (05/16 0400) Resp:  [18-20] 19 (05/16 0400) BP: (92-118)/(66-78) 102/66 (05/16 0817) SpO2:  [96 %-97 %] 96 % (05/16 0400) Weight:  [40.1 kg] 40.1 kg (05/16 0044) Last BM Date: 02/11/21  Weight change: Filed Weights   02/13/21 0142 02/14/21 0500 02/15/21 0044  Weight: 43.2 kg 43.4 kg 40.1 kg    Intake/Output:   Intake/Output Summary (Last 24 hours) at 02/15/2021 0957 Last data filed at 02/15/2021 0805 Gross per 24 hour  Intake 364.65 ml  Output 400 ml  Net -35.35 ml     PHYSICAL EXAM: General:  Weak/ frail appearing. No respiratory difficulty HEENT: normal Neck: supple. no JVD. Carotids 2+ bilat; no bruits. No lymphadenopathy or thyromegaly appreciated. Cor: PMI nondisplaced. Regular rate & rhythm. No rubs, gallops or murmurs. Lungs: clear Abdomen: soft, nontender, nondistended. No hepatosplenomegaly. No bruits or masses. Good bowel sounds. Extremities: no cyanosis, clubbing, rash, edema Neuro: alert & oriented x 3, cranial nerves grossly intact. moves all 4 extremities w/o difficulty. Affect pleasant.   Telemetry:  Sinus 70-80s Personally reviewed  Labs: Basic Metabolic Panel: Recent Labs  Lab 02/10/21 0034 02/10/21 0226 02/10/21 0900 02/12/21 0544 02/12/21 2043 02/13/21 0319 02/14/21 0550 02/15/21 0650  NA 121* 121*   < > 123* 125* 125* 126* 128*  K  --  3.1*   < > 4.6 4.2 3.7 3.7 3.5  CL  --  91*   < > 93* 95* 96* 97* 98  CO2  --  20*   < > 22 24 21* 21* 22  GLUCOSE  --  118*   < > 93 107* 97 96 95  BUN  --  5*   < > 8 12  10 8  7*  CREATININE  --  0.76   < > 0.76 0.75 0.69 0.66 0.68  CALCIUM  --  7.9*   < > 8.5* 7.7* 7.8* 8.3* 8.4*  MG 1.6* 1.5*  --  1.9  --   --   --   --   PHOS  --  3.1  --   --   --   --   --   --    < > = values in this interval not displayed.    Liver Function Tests: Recent Labs  Lab 02/11/21 0914 02/12/21 0544 02/13/21 0319 02/14/21 0550 02/15/21 0650  AST 27 23 23 25 29   ALT 24 23 19 20 22   ALKPHOS 81 77 66 78 84  BILITOT 0.4 0.6 0.7 0.8 0.9  PROT 5.9* 5.8* 5.2* 5.6* 5.6*  ALBUMIN 3.2* 3.1* 2.8* 2.9* 2.9*   No results for input(s): LIPASE, AMYLASE in the last 168 hours. No results for input(s): AMMONIA in the last 168 hours.  CBC: Recent Labs  Lab 02/09/21 2248 02/10/21 0226 02/11/21 0914  WBC 4.1 3.8* 7.4  NEUTROABS  --   --  5.7  HGB 11.5* 10.6* 12.4  HCT 32.7* 30.9* 35.0*  MCV 91.3 92.8 91.1  PLT 282  256 279    Cardiac Enzymes: No results for input(s): CKTOTAL, CKMB, CKMBINDEX, TROPONINI in the last 168 hours.  BNP: BNP (last 3 results) Recent Labs    02/01/21 1100  BNP 792.9*    ProBNP (last 3 results) No results for input(s): PROBNP in the last 8760 hours.    Other results:  Imaging: ECHOCARDIOGRAM LIMITED  Result Date: 02/13/2021    ECHOCARDIOGRAM LIMITED REPORT   Patient Name:   Colleen Fleming Date of Exam: 02/13/2021 Medical Rec #:  [id number]     Height:       57.0 in Accession #:    [id number]    Weight:       95.2 lb Date of Birth:  [birth year]     BSA:          1.312 m Patient Age:    65 years      BP:           114/72 mmHg Patient Gender: F             HR:           75 bpm. Exam Location:  Inpatient Procedure: 2D Echo, Cardiac Doppler and Color Doppler Indications:    CHF-Acute Systolic  History:        Patient has prior history of Echocardiogram examinations, most                 recent 02/01/2021. Previous Myocardial Infarction and CAD; Risk                 Factors:Dyslipidemia and Current Smoker. GERD.  Sonographer:    Clayton Lefort RDCS (AE)  Referring Phys: [place]  1. Akinesis of the anteroseptal, apical and distal inferior walls with overall severe LV dysfunction; large, predominantly anterior pericardial effusion; respiratory flow variation across mitral valve and RA collapse suggestive of early tamponade physiology but no RV diastolic collapse and IVC not dilated and collapses. Compared to 02/01/21, pericardial effusion is larger.  2. Left ventricular ejection fraction, by estimation, is 20 to 25%. The left ventricle has severely decreased function. The left ventricle demonstrates regional wall motion abnormalities (see scoring diagram/findings for description). There is moderate left ventricular hypertrophy of the basal-septal segment.  3. Right ventricular systolic function is normal. The right ventricular size is normal.  4. Large pericardial effusion.  5. The mitral valve is normal in structure. No evidence of mitral valve regurgitation. No evidence of mitral stenosis.  6. The aortic valve is tricuspid. Aortic valve regurgitation is not visualized. No aortic stenosis is present.  7. The inferior vena cava is normal in size with greater than 50% respiratory variability, suggesting right atrial pressure of 3 mmHg. FINDINGS  Left Ventricle: Left ventricular ejection fraction, by estimation, is 20 to 25%. The left ventricle has severely decreased function. The left ventricle demonstrates regional wall motion abnormalities. The left ventricular internal cavity size was normal  in size. There is moderate left ventricular hypertrophy of the basal-septal segment. Right Ventricle: The right ventricular size is normal.Right ventricular systolic function is normal. Left Atrium: Left atrial size was normal in size. Right Atrium: Right atrial size was normal in size. Pericardium: A large pericardial effusion is present. Mitral Valve: The mitral valve is normal in structure. No evidence of mitral valve stenosis. Tricuspid Valve: The  tricuspid valve is normal in structure. Tricuspid valve regurgitation is trivial. No evidence of tricuspid stenosis. Aortic Valve: The aortic valve is tricuspid. Aortic valve regurgitation  is not visualized. No aortic stenosis is present. Pulmonic Valve: The pulmonic valve was not well visualized. No evidence of pulmonic stenosis. Aorta: The aortic root is normal in size and structure. Venous: The inferior vena cava is normal in size with greater than 50% respiratory variability, suggesting right atrial pressure of 3 mmHg.  Additional Comments: Akinesis of the anteroseptal, apical and distal inferior walls with overall severe LV dysfunction; large, predominantly anterior pericardial effusion; respiratory flow variation across mitral valve and RA collapse suggestive of early  tamponade physiology but no RV diastolic collapse and IVC not dilated and collapses. Compared to 02/01/21, pericardial effusion is larger. LEFT VENTRICLE PLAX 2D LVIDd:         3.50 cm LVIDs:         2.70 cm LV PW:         1.10 cm LV IVS:        1.40 cm LVOT diam:     1.80 cm LVOT Area:     2.54 cm  LV Volumes (MOD) LV vol d, MOD A2C: 54.7 ml LV vol d, MOD A4C: 54.4 ml LV vol s, MOD A2C: 38.2 ml LV vol s, MOD A4C: 42.6 ml LV SV MOD A2C:     16.5 ml LV SV MOD A4C:     54.4 ml LV SV MOD BP:      15.4 ml IVC IVC diam: 1.50 cm LEFT ATRIUM         Index LA diam:    3.00 cm 2.29 cm/m   AORTA Ao Root diam: 3.10 cm Ao Asc diam:  2.80 cm  SHUNTS Systemic Diam: 1.80 cm Kirk Ruths MD Electronically signed by Kirk Ruths MD Signature Date/Time: 02/13/2021/3:22:45 PM    Final      Medications:     Scheduled Medications: . aspirin EC  81 mg Oral Daily  . atorvastatin  80 mg Oral Daily  . Chlorhexidine Gluconate Cloth  6 each Topical Daily  . enoxaparin (LOVENOX) injection  20 mg Subcutaneous Q24H  . midodrine  10 mg Oral TID WC  . sodium chloride flush  10-40 mL Intracatheter Q12H  . sodium chloride flush  3 mL Intravenous Q12H  .  ticagrelor  90 mg Oral BID    Infusions: . sodium chloride    . sodium chloride 10 mL/hr at 02/15/21 0511    PRN Medications: sodium chloride, acetaminophen, melatonin, nitroGLYCERIN, prochlorperazine, sodium chloride flush, sodium chloride flush   Assessment/Plan:   1. Hyponatremia  - Na 117 on admit. Due to hypovolemia in asetting of diarrhea-improving with IV fluids.  - 123 -> 125 ->126->128 today  - I think she is now volume replete. Would avoid more IVF.  - Restrict free water   2. Hypokalemia - K 2.8 on admit, due to GI loses + diuretic use - improved w/ supplementation, 3.5 today   3. COVID-19 Infection - no respiratory symptoms - Remdesivir x 3 days   4. Diarrhea - suspect due to COVID 19  - GI panel and C-diff pending per primary   5. CAD w/ Recent Anterior STEMI  - Admitted earlier this month w/ STEMI - cath w/ 99% pLAD occlusion treated w/ PCI/ DES + 80% dLAD to be treated medically - stable w/o CP. Admit EKG w/ no ischemic changes - c/w DAPT w/ ASA + Brilinta  - c/w statin   6. 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 No MR. RV normal.  - Echo 02/13/21 EF 20-25% moderate pericardial effusion. No overt tamponade. - Remains on midodrine 10 mg tid to support BP  - BP is low but does not appear to be in overt cardiogenic shock. Extremities are warm. Lungs clear. PICC has been dislodged. Will plan RHC today to further assess hemodynamics  7. H/o Lung Cancer - s/p chemo + radiation. Continues to smoke. CXR prior admit showed atraumatic rib fx. W/ hyponatremia,   Length of Stay: [place] [name]  02/15/2021, 9:57 AM  Advanced Heart Failure Team Pager [phone] (M-F; 7a - 4p)  Please contact [facility] for night-coverage after hours (4p -7a ) and weekends on [email or web address]  Patient seen and examined with  the above-signed Advanced Practice Provider and/or Housestaff. I personally reviewed laboratory data, imaging studies and relevant notes. I independently examined the patient and formulated the important aspects of the plan. I have edited the note to reflect any of my changes or salient points. I have personally discussed the plan with the patient and/or family.  Continues to feel weak. Denies SOB, orthopnea or PND. Na up to 128.   General:  Weak appearing. No resp difficulty HEENT: normal Neck: supple. no JVD. Carotids 2+ bilat; no bruits. No lymphadenopathy or thryomegaly appreciated. Cor: PMI nondisplaced. Regular rate & rhythm. No rubs, gallops or murmurs. Lungs: clear Abdomen: soft, nontender, nondistended. No hepatosplenomegaly. No bruits or masses. Good bowel sounds. Extremities: no cyanosis, clubbing, rash, edema Neuro: alert & orientedx3, cranial nerves grossly intact. moves all 4 extremities w/o difficulty. Affect pleasant  Will plan RHC today to further evaluate HF status. Echo with moderate effusion but no overt tamponade.   Sodium improving slowly.   Glori Bickers, MD  12:06 PM

## 2021-02-15 NOTE — Interval H&P Note (Signed)
History and Physical Interval Note:  02/15/2021 1:24 PM  Colleen Fleming  has presented today for surgery, with the diagnosis of HF.  The various methods of treatment have been discussed with the patient and family. After consideration of risks, benefits and other options for treatment, the patient has consented to  Procedure(s): RIGHT HEART CATH (N/A) as a surgical intervention.  The patient's history has been reviewed, patient examined, no change in status, stable for surgery.  I have reviewed the patient's chart and labs.  Questions were answered to the patient's satisfaction.     Cassey Hurrell

## 2021-02-15 NOTE — H&P (View-Only) (Signed)
Advanced Heart Failure Rounding Note   Subjective:    Patient had PICC placed but got dislodged.   Now on midodrine. SBPs low 100s.   Na up to 128 today.   Echo 02/13/21 EF 20-25% moderate pericardial effusion. No overt tamponade.   On for RHC today.   Feels weak. Denies SOB    Objective:   Weight Range:  Vital Signs:   Temp:  [97.5 F (36.4 C)-98.5 F (36.9 C)] 97.5 F (36.4 C) (05/16 0817) Pulse Rate:  [73-87] 73 (05/16 0400) Resp:  [18-20] 19 (05/16 0400) BP: (92-118)/(66-78) 102/66 (05/16 0817) SpO2:  [96 %-97 %] 96 % (05/16 0400) Weight:  [40.1 kg] 40.1 kg (05/16 0044) Last BM Date: 02/11/21  Weight change: Filed Weights   02/13/21 0142 02/14/21 0500 02/15/21 0044  Weight: 43.2 kg 43.4 kg 40.1 kg    Intake/Output:   Intake/Output Summary (Last 24 hours) at 02/15/2021 0957 Last data filed at 02/15/2021 0805 Gross per 24 hour  Intake 364.65 ml  Output 400 ml  Net -35.35 ml     PHYSICAL EXAM: General:  Weak/ frail appearing. No respiratory difficulty HEENT: normal Neck: supple. no JVD. Carotids 2+ bilat; no bruits. No lymphadenopathy or thyromegaly appreciated. Cor: PMI nondisplaced. Regular rate & rhythm. No rubs, gallops or murmurs. Lungs: clear Abdomen: soft, nontender, nondistended. No hepatosplenomegaly. No bruits or masses. Good bowel sounds. Extremities: no cyanosis, clubbing, rash, edema Neuro: alert & oriented x 3, cranial nerves grossly intact. moves all 4 extremities w/o difficulty. Affect pleasant.   Telemetry:  Sinus 70-80s Personally reviewed  Labs: Basic Metabolic Panel: Recent Labs  Lab 02/10/21 0034 02/10/21 0226 02/10/21 0900 02/12/21 0544 02/12/21 2043 02/13/21 0319 02/14/21 0550 02/15/21 0650  NA 121* 121*   < > 123* 125* 125* 126* 128*  K  --  3.1*   < > 4.6 4.2 3.7 3.7 3.5  CL  --  91*   < > 93* 95* 96* 97* 98  CO2  --  20*   < > 22 24 21* 21* 22  GLUCOSE  --  118*   < > 93 107* 97 96 95  BUN  --  5*   < > 8 12  10 8  7*  CREATININE  --  0.76   < > 0.76 0.75 0.69 0.66 0.68  CALCIUM  --  7.9*   < > 8.5* 7.7* 7.8* 8.3* 8.4*  MG 1.6* 1.5*  --  1.9  --   --   --   --   PHOS  --  3.1  --   --   --   --   --   --    < > = values in this interval not displayed.    Liver Function Tests: Recent Labs  Lab 02/11/21 0914 02/12/21 0544 02/13/21 0319 02/14/21 0550 02/15/21 0650  AST 27 23 23 25 29   ALT 24 23 19 20 22   ALKPHOS 81 77 66 78 84  BILITOT 0.4 0.6 0.7 0.8 0.9  PROT 5.9* 5.8* 5.2* 5.6* 5.6*  ALBUMIN 3.2* 3.1* 2.8* 2.9* 2.9*   No results for input(s): LIPASE, AMYLASE in the last 168 hours. No results for input(s): AMMONIA in the last 168 hours.  CBC: Recent Labs  Lab 02/09/21 2248 02/10/21 0226 02/11/21 0914  WBC 4.1 3.8* 7.4  NEUTROABS  --   --  5.7  HGB 11.5* 10.6* 12.4  HCT 32.7* 30.9* 35.0*  MCV 91.3 92.8 91.1  PLT 282  256 279    Cardiac Enzymes: No results for input(s): CKTOTAL, CKMB, CKMBINDEX, TROPONINI in the last 168 hours.  BNP: BNP (last 3 results) Recent Labs    02/01/21 1100  BNP 792.9*    ProBNP (last 3 results) No results for input(s): PROBNP in the last 8760 hours.    Other results:  Imaging: ECHOCARDIOGRAM LIMITED  Result Date: 02/13/2021    ECHOCARDIOGRAM LIMITED REPORT   Patient Name:   Colleen Fleming Date of Exam: 02/13/2021 Medical Rec #:  [id number]     Height:       57.0 in Accession #:    [id number]    Weight:       95.2 lb Date of Birth:  01/03/1956     BSA:          1.312 m Patient Age:    65 years      BP:           114/72 mmHg Patient Gender: F             HR:           75 bpm. Exam Location:  Inpatient Procedure: 2D Echo, Cardiac Doppler and Color Doppler Indications:    CHF-Acute Systolic  History:        Patient has prior history of Echocardiogram examinations, most                 recent 02/01/2021. Previous Myocardial Infarction and CAD; Risk                 Factors:Dyslipidemia and Current Smoker. GERD.  Sonographer:    Clayton Lefort RDCS (AE)  Referring Phys: Sharon  1. Akinesis of the anteroseptal, apical and distal inferior walls with overall severe LV dysfunction; large, predominantly anterior pericardial effusion; respiratory flow variation across mitral valve and RA collapse suggestive of early tamponade physiology but no RV diastolic collapse and IVC not dilated and collapses. Compared to 02/01/21, pericardial effusion is larger.  2. Left ventricular ejection fraction, by estimation, is 20 to 25%. The left ventricle has severely decreased function. The left ventricle demonstrates regional wall motion abnormalities (see scoring diagram/findings for description). There is moderate left ventricular hypertrophy of the basal-septal segment.  3. Right ventricular systolic function is normal. The right ventricular size is normal.  4. Large pericardial effusion.  5. The mitral valve is normal in structure. No evidence of mitral valve regurgitation. No evidence of mitral stenosis.  6. The aortic valve is tricuspid. Aortic valve regurgitation is not visualized. No aortic stenosis is present.  7. The inferior vena cava is normal in size with greater than 50% respiratory variability, suggesting right atrial pressure of 3 mmHg. FINDINGS  Left Ventricle: Left ventricular ejection fraction, by estimation, is 20 to 25%. The left ventricle has severely decreased function. The left ventricle demonstrates regional wall motion abnormalities. The left ventricular internal cavity size was normal  in size. There is moderate left ventricular hypertrophy of the basal-septal segment. Right Ventricle: The right ventricular size is normal.Right ventricular systolic function is normal. Left Atrium: Left atrial size was normal in size. Right Atrium: Right atrial size was normal in size. Pericardium: A large pericardial effusion is present. Mitral Valve: The mitral valve is normal in structure. No evidence of mitral valve stenosis. Tricuspid Valve: The  tricuspid valve is normal in structure. Tricuspid valve regurgitation is trivial. No evidence of tricuspid stenosis. Aortic Valve: The aortic valve is tricuspid. Aortic valve regurgitation  is not visualized. No aortic stenosis is present. Pulmonic Valve: The pulmonic valve was not well visualized. No evidence of pulmonic stenosis. Aorta: The aortic root is normal in size and structure. Venous: The inferior vena cava is normal in size with greater than 50% respiratory variability, suggesting right atrial pressure of 3 mmHg.  Additional Comments: Akinesis of the anteroseptal, apical and distal inferior walls with overall severe LV dysfunction; large, predominantly anterior pericardial effusion; respiratory flow variation across mitral valve and RA collapse suggestive of early  tamponade physiology but no RV diastolic collapse and IVC not dilated and collapses. Compared to 02/01/21, pericardial effusion is larger. LEFT VENTRICLE PLAX 2D LVIDd:         3.50 cm LVIDs:         2.70 cm LV PW:         1.10 cm LV IVS:        1.40 cm LVOT diam:     1.80 cm LVOT Area:     2.54 cm  LV Volumes (MOD) LV vol d, MOD A2C: 54.7 ml LV vol d, MOD A4C: 54.4 ml LV vol s, MOD A2C: 38.2 ml LV vol s, MOD A4C: 42.6 ml LV SV MOD A2C:     16.5 ml LV SV MOD A4C:     54.4 ml LV SV MOD BP:      15.4 ml IVC IVC diam: 1.50 cm LEFT ATRIUM         Index LA diam:    3.00 cm 2.29 cm/m   AORTA Ao Root diam: 3.10 cm Ao Asc diam:  2.80 cm  SHUNTS Systemic Diam: 1.80 cm Kirk Ruths MD Electronically signed by Kirk Ruths MD Signature Date/Time: 02/13/2021/3:22:45 PM    Final      Medications:     Scheduled Medications: . aspirin EC  81 mg Oral Daily  . atorvastatin  80 mg Oral Daily  . Chlorhexidine Gluconate Cloth  6 each Topical Daily  . enoxaparin (LOVENOX) injection  20 mg Subcutaneous Q24H  . midodrine  10 mg Oral TID WC  . sodium chloride flush  10-40 mL Intracatheter Q12H  . sodium chloride flush  3 mL Intravenous Q12H  .  ticagrelor  90 mg Oral BID    Infusions: . sodium chloride    . sodium chloride 10 mL/hr at 02/15/21 0511    PRN Medications: sodium chloride, acetaminophen, melatonin, nitroGLYCERIN, prochlorperazine, sodium chloride flush, sodium chloride flush   Assessment/Plan:   1. Hyponatremia  - Na 117 on admit. Due to hypovolemia in asetting of diarrhea-improving with IV fluids.  - 123 -> 125 ->126->128 today  - I think she is now volume replete. Would avoid more IVF.  - Restrict free water   2. Hypokalemia - K 2.8 on admit, due to GI loses + diuretic use - improved w/ supplementation, 3.5 today   3. COVID-19 Infection - no respiratory symptoms - Remdesivir x 3 days   4. Diarrhea - suspect due to COVID 19  - GI panel and C-diff pending per primary   5. CAD w/ Recent Anterior STEMI  - Admitted earlier this month w/ STEMI - cath w/ 99% pLAD occlusion treated w/ PCI/ DES + 80% dLAD to be treated medically - stable w/o CP. Admit EKG w/ no ischemic changes - c/w DAPT w/ ASA + Brilinta  - c/w statin   6. 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 No MR. RV normal.  - Echo 02/13/21 EF 20-25% moderate pericardial effusion. No overt tamponade. - Remains on midodrine 10 mg tid to support BP  - BP is low but does not appear to be in overt cardiogenic shock. Extremities are warm. Lungs clear. PICC has been dislodged. Will plan RHC today to further assess hemodynamics  7. H/o Lung Cancer - s/p chemo + radiation. Continues to smoke. CXR prior admit showed atraumatic rib fx. W/ hyponatremia,   Length of Stay: [place] [name]  02/15/2021, 9:57 AM  Advanced Heart Failure Team Pager [phone] (M-F; 7a - 4p)  Please contact [facility] for night-coverage after hours (4p -7a ) and weekends on [email or web address]  Patient seen and examined with  the above-signed Advanced Practice Provider and/or Housestaff. I personally reviewed laboratory data, imaging studies and relevant notes. I independently examined the patient and formulated the important aspects of the plan. I have edited the note to reflect any of my changes or salient points. I have personally discussed the plan with the patient and/or family.  Continues to feel weak. Denies SOB, orthopnea or PND. Na up to 128.   General:  Weak appearing. No resp difficulty HEENT: normal Neck: supple. no JVD. Carotids 2+ bilat; no bruits. No lymphadenopathy or thryomegaly appreciated. Cor: PMI nondisplaced. Regular rate & rhythm. No rubs, gallops or murmurs. Lungs: clear Abdomen: soft, nontender, nondistended. No hepatosplenomegaly. No bruits or masses. Good bowel sounds. Extremities: no cyanosis, clubbing, rash, edema Neuro: alert & orientedx3, cranial nerves grossly intact. moves all 4 extremities w/o difficulty. Affect pleasant  Will plan RHC today to further evaluate HF status. Echo with moderate effusion but no overt tamponade.   Sodium improving slowly.   Glori Bickers, MD  12:06 PM

## 2021-02-15 NOTE — Progress Notes (Signed)
PT Cancellation Note  Patient Details Name: Colleen Fleming MRN: [id number] DOB: 28-Feb-1956   Cancelled Treatment:    Reason Eval/Treat Not Completed: Patient at procedure or test/unavailable ([place]). Will follow-up for PT treatment as schedule permits.  Mabeline Caras, PT, DPT Acute Rehabilitation Services  Pager [phone] Office [place] 02/15/2021, 1:40 PM

## 2021-02-16 ENCOUNTER — Other Ambulatory Visit (HOSPITAL_COMMUNITY): Payer: Self-pay

## 2021-02-16 DIAGNOSIS — I5022 Chronic systolic (congestive) heart failure: Secondary | ICD-10-CM | POA: Diagnosis not present

## 2021-02-16 DIAGNOSIS — I959 Hypotension, unspecified: Secondary | ICD-10-CM | POA: Diagnosis not present

## 2021-02-16 DIAGNOSIS — E871 Hypo-osmolality and hyponatremia: Secondary | ICD-10-CM | POA: Diagnosis not present

## 2021-02-16 LAB — BASIC METABOLIC PANEL
Anion gap: 9 (ref 5–15)
BUN: 7 mg/dL — ABNORMAL LOW (ref 8–23)
CO2: 22 mmol/L (ref 22–32)
Calcium: 8.4 mg/dL — ABNORMAL LOW (ref 8.9–10.3)
Chloride: 98 mmol/L (ref 98–111)
Creatinine, Ser: 0.74 mg/dL (ref 0.44–1.00)
GFR, Estimated: >60 mL/min (ref >60)
Glucose, Bld: 84 mg/dL (ref 70–99)
Potassium: 3.3 mmol/L — ABNORMAL LOW (ref 3.5–5.1)
Sodium: 129 mmol/L — ABNORMAL LOW (ref 135–145)

## 2021-02-16 MED ORDER — DIGOXIN 125 MCG PO TABS
0.1250 mg | ORAL_TABLET | Freq: Every day | ORAL | Status: DC
  Administered 2021-02-16 – 2021-02-20 (×5): 0.125 mg via ORAL
  Filled 2021-02-16 (×5): qty 1

## 2021-02-16 MED ORDER — MIDODRINE HCL 10 MG PO TABS
10.0000 mg | ORAL_TABLET | Freq: Three times a day (TID) | ORAL | 1 refills | Status: DC
  Filled 2021-02-16: qty 90, 30d supply, fill #0

## 2021-02-16 MED ORDER — DIGOXIN 125 MCG PO TABS
0.1250 mg | ORAL_TABLET | Freq: Every day | ORAL | 0 refills | Status: DC
  Filled 2021-02-16: qty 30, 30d supply, fill #0

## 2021-02-16 MED ORDER — POTASSIUM CHLORIDE CRYS ER 20 MEQ PO TBCR
40.0000 meq | EXTENDED_RELEASE_TABLET | ORAL | Status: AC
  Administered 2021-02-16: 40 meq via ORAL
  Filled 2021-02-16: qty 2

## 2021-02-16 NOTE — Discharge Summary (Signed)
PATIENT DETAILS Name: Colleen Fleming Age: 65 y.o. Sex: female Date of Birth: 1955/10/10 MRN: [id number]. Admitting Physician: Kayleen Memos, DO FXT:[id number], [place], [place]  Admit Date: 02/09/2021 Discharge date: 02/16/2021  Recommendations for Outpatient Follow-up:  1. Follow up with PCP in 1-2 weeks 2. Please obtain CMP/CBC in one week 3. Please ensure follow-up with CHF clinic.  Admitted From:  Home  Disposition: Home with home health services   [place]: No  Equipment/Devices: None  Discharge Condition: Stable  CODE STATUS: FULL CODE  Diet recommendation:  Diet Order            Diet - low sodium heart healthy           Diet 2 gram sodium Room service appropriate? Yes; Fluid consistency: Thin; Fluid restriction: 1200 mL Fluid  Diet effective now                  Brief Narrative: Patient is a 65 y.o. female with PMHx of CAD-recent PCI, chronic systolic heart failure-presented with diarrhea-found to have severe hyponatremia.  Found to have COVID-19 infection.  COVID-19 vaccinated status:  Significant Events: 5/10>> Admit to [facility] for diarrhea-vit D-found to have hyponatremia-sodium 117.  Significant studies: 5/10>>Chest x-ray: No pneumonia  COVID-19 medications: Remdesivir:5/11>> 5/13  Antibiotics: None  Microbiology data: None  Procedures: 5/16>> RHC: RA = 2 RV = 17/2 PA = 17/4 (8) PCW = 4 Fick cardiac output/index = 3.0/2.4 Thermo CO/CI = 2.8/2.2 PVR = 1.3 Ao sat = 98% PA sat = 64%, 65  Consults: Advanced heart failure team.  Brief Hospital Course: Hyponatremia: Felt to be due to hypovolemia in the setting of COVID-19 related gastroenteritis/diarrhea.    She was provided supportive care-given IV fluid until she was euvolemic-following diet-she was placed on fluid restriction.  Sodium continues to slowly improve.  She will need close outpatient follow-up-and will need to remain on fluid restriction as an outpatient.     Hypokalemia: Due to GI losses-had resolved-but has mild hyponatremia and a day of discharge-will be repleted prior to discharge.  Please recheck electrolytes at next visit with PCP.  Diarrhea: Likely from COVID 19 infection-resolved.  COVID-19 infection: Likely causing diarrhea-on Remdesivir x3 days.  Hypotension: Asymptomatic-probably a combination of hypovolemia-and CHF.  BP remains soft in spite of being on midodrine.    Patient underwent RHC on 5/16-low filling pressures-blood pressure has now stabilized with midodrine.  Recent STEMI-s/p PCI: No anginal symptoms-on antiplatelets/statin  Chronic systolic heart failure (EF 25% on echo 5/2): Volume status stable-see above.  Cardiology does not recommend adding beta-blocker/Spiro/diuretics with low blood pressure on discharge.  Can be added upon follow-up depending on how she does.   Discharge Diagnoses:  Active Problems:   Hyponatremia   Discharge Instructions:  Activity:  As tolerated  Discharge Instructions    (HEART FAILURE PATIENTS) Call MD:  Anytime you have any of the following symptoms: 1) 3 pound weight gain in 24 hours or 5 pounds in 1 week 2) shortness of breath, with or without a dry hacking cough 3) swelling in the hands, feet or stomach 4) if you have to sleep on extra pillows at night in order to breathe.   Complete by: As directed    Call MD for:  difficulty breathing, headache or visual disturbances   Complete by: As directed    Diet - low sodium heart healthy   Complete by: As directed    Discharge instructions   Complete by: As directed  Follow with Primary MD  Belva Bertin, [place], [name] in 1-2 weeks  Please follow-up with the CHF clinic as instructed.  Please get a complete blood count and chemistry panel checked by your Primary MD at your next visit, and again as instructed by your Primary MD.  Get Medicines reviewed and adjusted: Please take all your medications with you for your next visit  with your Primary MD  Laboratory/radiological data: Please request your Primary MD to go over all hospital tests and procedure/radiological results at the follow up, please ask your Primary MD to get all Hospital records sent to his/her office.  In some cases, they will be blood work, cultures and biopsy results pending at the time of your discharge. Please request that your primary care M.D. follows up on these results.  Also Note the following: If you experience worsening of your admission symptoms, develop shortness of breath, life threatening emergency, suicidal or homicidal thoughts you must seek medical attention immediately by calling 911 or calling your MD immediately  if symptoms less severe.  You must read complete instructions/literature along with all the possible adverse reactions/side effects for all the Medicines you take and that have been prescribed to you. Take any new Medicines after you have completely understood and accpet all the possible adverse reactions/side effects.   Do not drive when taking Pain medications or sleeping medications (Benzodaizepines)  Do not take more than prescribed Pain, Sleep and Anxiety Medications. It is not advisable to combine anxiety,sleep and pain medications without talking with your primary care practitioner  Special Instructions: If you have smoked or chewed Tobacco  in the last 2 yrs please stop smoking, stop any regular Alcohol  and or any Recreational drug use.  Wear Seat belts while driving.  Please note: You were cared for by a hospitalist during your hospital stay. Once you are discharged, your primary care physician will handle any further medical issues. Please note that NO REFILLS for any discharge medications will be authorized once you are discharged, as it is imperative that you return to your primary care physician (or establish a relationship with a primary care physician if you do not have one) for your post hospital discharge  needs so that they can reassess your need for medications and monitor your lab values.   Fluid restriction of 1200 cc a day.   Increase activity slowly   Complete by: As directed      Allergies as of 02/16/2021      Reactions   Penicillins Hives      Medication List    STOP taking these medications   metoprolol succinate 25 MG 24 hr tablet Commonly known as: TOPROL-XL   spironolactone 25 MG tablet Commonly known as: ALDACTONE     TAKE these medications   Aspirin Low Dose 81 MG EC tablet Generic drug: aspirin Take 1 tablet (81 mg total) by mouth daily. Swallow whole.   atorvastatin 80 MG tablet Commonly known as: LIPITOR Take 1 tablet (80 mg total) by mouth daily.   Brilinta 90 MG Tabs tablet Generic drug: ticagrelor Take 1 tablet (90 mg total) by mouth 2 (two) times daily.   digoxin 0.125 MG tablet Commonly known as: LANOXIN Take 1 tablet (0.125 mg total) by mouth daily.   midodrine 10 MG tablet Commonly known as: PROAMATINE Take 1 tablet (10 mg total) by mouth 3 (three) times daily with meals.   nitroGLYCERIN 0.4 MG SL tablet Commonly known as: NITROSTAT Place 1 tablet (0.4 mg  total) under the tongue every 5 (five) minutes x 3 doses as needed for chest pain.       Follow-up Information    [facility], [place], [place]. Schedule an appointment as soon as possible for a visit in 1 week(s).   Specialty: Family Medicine Contact information: [address] [place] [address] [phone]        Leonie Man, MD Follow up in 1 month(s).   Specialty: Cardiology Contact information: [address] [place] [address] [phone]        [facility] Follow up on 02/18/2021.   Specialty: Cardiology Why: appt at 10:30 am Contact information: [address] [id number] [place] [phone]             Allergies  Allergen Reactions  .  Penicillins Hives      Other Procedures/Studies: DG Chest 2 View  Result Date: 02/01/2021 CLINICAL DATA:  History of lung cancer EXAM: CHEST - 2 VIEW COMPARISON:  Radiograph 11/25/2011 FINDINGS: Right IJ central venous catheter tip terminates at the right atrium. Telemetry leads overlie the chest. Chronically coarsened interstitial changes and bronchitic features. Bandlike opacity is seen in the left lung apex with some tenting of the left hemidiaphragm, could reflect subsegmental atelectasis or volume loss though underlying airspace disease or architectural distortion could have a similar appearance. No focal consolidative opacity is seen. The cardiomediastinal contours are unremarkable. Few coronary stents are noted. Age-indeterminate left sixth and seventh posterolateral left rib fractures. No other acute osseous or soft tissue abnormalities the chest wall. IMPRESSION: Chronically coarsened interstitial and bronchitic features. New bandlike opacity in left lung apex with tenting of the left hemidiaphragm. Could reflect scarring or atelectatic change with volume loss. Underlying airspace disease or mass is less favored though not fully excluded. Right IJ approach central venous catheter at the right atrium Posterolateral left sixth and seventh rib fractures, age indeterminate, correlate for point tenderness. Electronically Signed   By: Lovena Le M.D.   On: 02/01/2021 04:25   CARDIAC CATHETERIZATION  Result Date: 02/15/2021 Findings: RA = 2 RV = 17/2 PA = 17/4 (8) PCW = 4 Fick cardiac output/index = 3.0/2.4 Thermo CO/CI = 2.8/2.2 PVR = 1.3 Ao sat = 98% PA sat = 64%, 65% Assessment: 1. Low filling pressures with moderately reduced cardiac output Plan/Discussion: Hydrate gently. No role for inotropic support at this time. Glori Bickers, MD 2:24 PM   CARDIAC CATHETERIZATION  Result Date: 01/30/2021  The left ventricular ejection fraction is 45-50% by visual estimate. Mid to apical anterior  hypokinesis.  LV end diastolic pressure is mildly elevated at 13 mmHg, with systolic 98 mmHg.  There is trivial (1+) mitral regurgitation.  --------------------------------------------  Culprit lesion: Prox LAD lesion is 99% stenosed. (Calcified, thrombotic)  A drug-eluting stent was successfully placed using a SYNERGY XD 2.50X16 -> postdilated 2.8 mm  Post intervention, there is a 0% residual stenosis.  Dist LAD lesion is 80% stenosed -> there is TIMI 0 flow to the apex, post PCI TIMI II flow, but still notable irregularities distally, likely distal embolism.  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  Distal LAD has TIMI I 0 flow.  Successful DES PCI of the LAD using a synergy DES 2.5 mm x 16 mm postdilated to 2.8 mm.  Otherwise angiographically normal coronary arteries with exception of the apical LAD.  Mild  mid to apical anterior hypokinesis on LV gram with mildly reduced EF of roughly 45%.  Upper limit of normal LVEDP.  Borderline hypotension with systolic pressures ranging in the 95 to 105 mmHg range -> no evidence of shock  Severe hypOkalemia, i-STAT potassium read is 2.1, labs from ER read is 2.7.  Resolution of ventricular bigeminy. RECOMMENDATION  Admit to CVICU  Will run 3 rounds of IV potassium and recheck in 3 hours.  Continue IV Aggrastat for another 1 hour post PCI.  Check 2D echocardiogram, A1c and lipid panel the morning.  High-dose high intensity statin  Will attempt to start low-dose beta-blocker given tachycardia, however with borderline pressures, may need to hold.  Cardiac rehab consult  Smoking cessation consult Glenetta Hew, MD  DG Chest [facility] 1 View  Result Date: 02/09/2021 CLINICAL DATA:  Weakness, history of recent STEMI EXAM: PORTABLE CHEST 1 VIEW COMPARISON:  01/31/2021 FINDINGS: Cardiac shadow is at the upper limits of normal in size. Prior right jugular central line has been removed  in the interval. The lungs are well aerated without focal infiltrate or sizable effusion. Chronic band like density is noted in the left upper lobe medially consistent with prior radiation therapy. This is stable from a prior CT from 12/23/2020 IMPRESSION: No acute abnormality noted. Prior radiation change in the medial left upper lobe. Electronically Signed   By: Inez Catalina M.D.   On: 02/09/2021 23:34   ECHOCARDIOGRAM COMPLETE  Result Date: 01/31/2021    ECHOCARDIOGRAM REPORT   Patient Name:   LIZZETH MEDER Date of Exam: 01/31/2021 Medical Rec #:  [id number]         Height:       58.0 in Accession #:    [id number]        Weight:       100.1 lb Date of Birth:  March 16, 1956         BSA:          1.357 m Patient Age:    23 years          BP:           133/91 mmHg Patient Gender: F                 HR:           73 bpm. Exam Location:  Inpatient Procedure: 2D Echo, Cardiac Doppler, Color Doppler and 3D Echo Indications:    Acute myocardial infarction  History:        Patient has no prior history of Echocardiogram examinations.                 Acute MI and CAD; Risk Factors:Current Smoker. GERD.  Sonographer:    Clayton Lefort RDCS (AE) Referring Phys: [place]  1. No left ventricular thrombus. Left ventricular ejection fraction, by estimation, is 25 to 30%. The left ventricle has severely decreased function. The left ventricle demonstrates regional wall motion abnormalities (see scoring diagram/findings for description). Left ventricular diastolic parameters are consistent with Grade II diastolic dysfunction (pseudonormalization). Elevated left atrial pressure. There is severe hypokinesis of the left ventricular, entire anteroseptal wall and anterior wall. There is mild dyskinesis of the left ventricular, apical anterior wall and apical segment.  2. Right ventricular systolic function is normal. The right ventricular size is normal. There is mildly elevated pulmonary artery systolic pressure.  3.  A small pericardial effusion is present. The pericardial effusion is circumferential.  4. The mitral valve  is normal in structure. No evidence of mitral valve regurgitation.  5. The aortic valve is normal in structure. Aortic valve regurgitation is mild.  6. The inferior vena cava is normal in size with <50% respiratory variability, suggesting right atrial pressure of 8 mmHg. FINDINGS  Left Ventricle: No left ventricular thrombus. Left ventricular ejection fraction, by estimation, is 25 to 30%. The left ventricle has severely decreased function. The left ventricle demonstrates regional wall motion abnormalities. Severe hypokinesis of the left ventricular, entire anteroseptal wall and anterior wall. Mild dyskinesis of the left ventricular, apical anterior wall and apical segment. 3D left ventricular ejection fraction analysis performed but not reported based on interpreter judgement due to suboptimal quality. The left ventricular internal cavity size was normal in size. There is no left ventricular hypertrophy. Left ventricular diastolic parameters are consistent with Grade II diastolic dysfunction (pseudonormalization). Elevated left atrial pressure.  LV Wall Scoring: The apical anterior segment and apex are dyskinetic. The anterior wall, entire anterior septum, apical lateral segment, mid inferoseptal segment, and apical inferior segment are hypokinetic. The antero-lateral wall, inferior wall, posterior wall, and basal inferoseptal segment are normal. Right Ventricle: The right ventricular size is normal. No increase in right ventricular wall thickness. Right ventricular systolic function is normal. There is mildly elevated pulmonary artery systolic pressure. The tricuspid regurgitant velocity is 2.63  m/s, and with an assumed right atrial pressure of 8 mmHg, the estimated right ventricular systolic pressure is 32.2 mmHg. Left Atrium: Left atrial size was normal in size. Right Atrium: Right atrial size was normal  in size. Pericardium: A small pericardial effusion is present. The pericardial effusion is circumferential. Mitral Valve: The mitral valve is normal in structure. No evidence of mitral valve regurgitation. MV peak gradient, 2.7 mmHg. The mean mitral valve gradient is 1.0 mmHg. Tricuspid Valve: The tricuspid valve is normal in structure. Tricuspid valve regurgitation is mild. Aortic Valve: The aortic valve is normal in structure. Aortic valve regurgitation is mild. Aortic valve mean gradient measures 2.0 mmHg. Aortic valve peak gradient measures 3.3 mmHg. Aortic valve area, by VTI measures 1.59 cm. Pulmonic Valve: The pulmonic valve was normal in structure. Pulmonic valve regurgitation is trivial. Aorta: The aortic root is normal in size and structure. Venous: The inferior vena cava is normal in size with less than 50% respiratory variability, suggesting right atrial pressure of 8 mmHg. IAS/Shunts: No atrial level shunt detected by color flow Doppler. Additional Comments: A venous catheter is visualized in the right atrium.  LEFT VENTRICLE PLAX 2D LVIDd:         3.80 cm  Diastology LVIDs:         2.30 cm  LV e' medial:    4.46 cm/s LV PW:         1.10 cm  LV E/e' medial:  11.5 LV IVS:        0.90 cm  LV e' lateral:   5.44 cm/s LVOT diam:     1.70 cm  LV E/e' lateral: 9.4 LV SV:         28 LV SV Index:   21 LVOT Area:     2.27 cm                          3D Volume EF:                         3D EF:  36 %                         LV EDV:       97 ml                         LV ESV:       62 ml                         LV SV:        35 ml RIGHT VENTRICLE            IVC RV Basal diam:  2.50 cm    IVC diam: 1.80 cm RV S prime:     9.79 cm/s TAPSE (M-mode): 1.2 cm LEFT ATRIUM             Index       RIGHT ATRIUM          Index LA diam:        1.90 cm 1.40 cm/m  RA Area:     7.28 cm LA Vol (A2C):   23.0 ml 16.95 ml/m RA Volume:   13.40 ml 9.88 ml/m LA Vol (A4C):   23.2 ml 17.10 ml/m LA Biplane Vol: 24.4 ml 17.98  ml/m  AORTIC VALVE AV Area (Vmax):    1.65 cm AV Area (Vmean):   1.60 cm AV Area (VTI):     1.59 cm AV Vmax:           91.00 cm/s AV Vmean:          64.500 cm/s AV VTI:            0.176 m AV Peak Grad:      3.3 mmHg AV Mean Grad:      2.0 mmHg LVOT Vmax:         66.20 cm/s LVOT Vmean:        45.600 cm/s LVOT VTI:          0.123 m LVOT/AV VTI ratio: 0.70  AORTA Ao Root diam: 2.90 cm Ao Asc diam:  3.10 cm MITRAL VALVE               TRICUSPID VALVE MV Area (PHT): 3.77 cm    TR Peak grad:   27.7 mmHg MV Area VTI:   1.81 cm    TR Vmax:        263.00 cm/s MV Peak grad:  2.7 mmHg MV Mean grad:  1.0 mmHg    SHUNTS MV Vmax:       0.81 m/s    Systemic VTI:  0.12 m MV Vmean:      49.0 cm/s   Systemic Diam: 1.70 cm MV Decel Time: 201 msec MV E velocity: 51.40 cm/s MV A velocity: 41.60 cm/s MV E/A ratio:  1.24 Mihai Croitoru MD Electronically signed by Sanda Klein MD Signature Date/Time: 01/31/2021/12:09:51 PM    Final    ECHOCARDIOGRAM LIMITED  Result Date: 02/13/2021    ECHOCARDIOGRAM LIMITED REPORT   Patient Name:   CERENITI CURB Date of Exam: 02/13/2021 Medical Rec #:  [id number]     Height:       57.0 in Accession #:    [id number]    Weight:       95.2 lb Date of Birth:  1955/10/31     BSA:          1.312  m Patient Age:    75 years      BP:           114/72 mmHg Patient Gender: F             HR:           75 bpm. Exam Location:  Inpatient Procedure: 2D Echo, Cardiac Doppler and Color Doppler Indications:    CHF-Acute Systolic  History:        Patient has prior history of Echocardiogram examinations, most                 recent 02/01/2021. Previous Myocardial Infarction and CAD; Risk                 Factors:Dyslipidemia and Current Smoker. GERD.  Sonographer:    Clayton Lefort RDCS (AE) Referring Phys: [place]  1. Akinesis of the anteroseptal, apical and distal inferior walls with overall severe LV dysfunction; large, predominantly anterior pericardial effusion; respiratory flow variation  across mitral valve and RA collapse suggestive of early tamponade physiology but no RV diastolic collapse and IVC not dilated and collapses. Compared to 02/01/21, pericardial effusion is larger.  2. Left ventricular ejection fraction, by estimation, is 20 to 25%. The left ventricle has severely decreased function. The left ventricle demonstrates regional wall motion abnormalities (see scoring diagram/findings for description). There is moderate left ventricular hypertrophy of the basal-septal segment.  3. Right ventricular systolic function is normal. The right ventricular size is normal.  4. Large pericardial effusion.  5. The mitral valve is normal in structure. No evidence of mitral valve regurgitation. No evidence of mitral stenosis.  6. The aortic valve is tricuspid. Aortic valve regurgitation is not visualized. No aortic stenosis is present.  7. The inferior vena cava is normal in size with greater than 50% respiratory variability, suggesting right atrial pressure of 3 mmHg. FINDINGS  Left Ventricle: Left ventricular ejection fraction, by estimation, is 20 to 25%. The left ventricle has severely decreased function. The left ventricle demonstrates regional wall motion abnormalities. The left ventricular internal cavity size was normal  in size. There is moderate left ventricular hypertrophy of the basal-septal segment. Right Ventricle: The right ventricular size is normal.Right ventricular systolic function is normal. Left Atrium: Left atrial size was normal in size. Right Atrium: Right atrial size was normal in size. Pericardium: A large pericardial effusion is present. Mitral Valve: The mitral valve is normal in structure. No evidence of mitral valve stenosis. Tricuspid Valve: The tricuspid valve is normal in structure. Tricuspid valve regurgitation is trivial. No evidence of tricuspid stenosis. Aortic Valve: The aortic valve is tricuspid. Aortic valve regurgitation is not visualized. No aortic stenosis is  present. Pulmonic Valve: The pulmonic valve was not well visualized. No evidence of pulmonic stenosis. Aorta: The aortic root is normal in size and structure. Venous: The inferior vena cava is normal in size with greater than 50% respiratory variability, suggesting right atrial pressure of 3 mmHg.  Additional Comments: Akinesis of the anteroseptal, apical and distal inferior walls with overall severe LV dysfunction; large, predominantly anterior pericardial effusion; respiratory flow variation across mitral valve and RA collapse suggestive of early  tamponade physiology but no RV diastolic collapse and IVC not dilated and collapses. Compared to 02/01/21, pericardial effusion is larger. LEFT VENTRICLE PLAX 2D LVIDd:         3.50 cm LVIDs:         2.70 cm LV PW:  1.10 cm LV IVS:        1.40 cm LVOT diam:     1.80 cm LVOT Area:     2.54 cm  LV Volumes (MOD) LV vol d, MOD A2C: 54.7 ml LV vol d, MOD A4C: 54.4 ml LV vol s, MOD A2C: 38.2 ml LV vol s, MOD A4C: 42.6 ml LV SV MOD A2C:     16.5 ml LV SV MOD A4C:     54.4 ml LV SV MOD BP:      15.4 ml IVC IVC diam: 1.50 cm LEFT ATRIUM         Index LA diam:    3.00 cm 2.29 cm/m   AORTA Ao Root diam: 3.10 cm Ao Asc diam:  2.80 cm  SHUNTS Systemic Diam: 1.80 cm Kirk Ruths MD Electronically signed by Kirk Ruths MD Signature Date/Time: 02/13/2021/3:22:45 PM    Final    ECHOCARDIOGRAM LIMITED  Result Date: 02/01/2021    ECHOCARDIOGRAM LIMITED REPORT   Patient Name:   BRECKEN WALTH Date of Exam: 02/01/2021 Medical Rec #:  [id number]     Height:       57.0 in Accession #:    [id number]    Weight:       97.0 lb Date of Birth:  [birth year]     BSA:          1.322 m Patient Age:    41 years      BP:           94/64 mmHg Patient Gender: F             HR:           92 bpm. Exam Location:  Inpatient Procedure: Limited Color Doppler, Cardiac Doppler, Color Doppler and Limited            Echo STAT ECHO Indications:    Pericardial effusion with hypotension. Assess for tamponade   History:        Patient has prior history of Echocardiogram examinations, most                 recent 01/31/2021. CAD and Previous Myocardial Infarction,                 Abnormal ECG and Left heart cath; Signs/Symptoms:Hypotension.                 Frequent falls with broken ribs. Lung cancer.  Sonographer:    Merrie Roof Referring Phys: [place] Comments: This was a limited echo to rule out tamponade. IMPRESSIONS  1. Left ventricular ejection fraction, by estimation, is 25%. The left ventricle has severely decreased function. The left ventricle demonstrates regional wall motion abnormalities with mid to apical anteroseptal/inferoseptal/anterior akinesis, apical lateral akinesis, and akinesis of the true apex. Consistent with large LAD territory MI. There is mild left ventricular hypertrophy.  2. Right ventricular systolic function is normal. The right ventricular size is normal.  3. Small to moderate pericardial effusion primarily adjacent to the RV. The IVC collapses around 50% with respiration. Difficult to interpret respirometry due to frequent ectopy. RV diastolic collapse is not present. No overt pericardial tamponade. Pericardial effusion is similar to the prior study.  4. The inferior vena cava is normal in size with <50% respiratory variability, suggesting right atrial pressure of 8 mmHg.  5. Limited echo FINDINGS  Left Ventricle: Left ventricular ejection fraction, by estimation, is 25%. The left ventricle has severely decreased function. The  left ventricle demonstrates regional wall motion abnormalities. The left ventricular internal cavity size was normal in size. There is mild left ventricular hypertrophy. Right Ventricle: The right ventricular size is normal. No increase in right ventricular wall thickness. Right ventricular systolic function is normal. Left Atrium: Left atrial size was normal in size. Right Atrium: Right atrial size was normal in size. Pericardium: Small to  moderate pericardial effusion primarily adjacent to the RV. The IVC collapses around 50% with respiration. Difficult to interpret respirometry due to frequent ectopy. RV diastolic collapse is not present. No overt pericardial tamponade. Venous: The inferior vena cava is normal in size with less than 50% respiratory variability, suggesting right atrial pressure of 8 mmHg. IAS/Shunts: No atrial level shunt detected by color flow Doppler. IVC IVC diam: 2.00 cm Loralie Champagne MD Electronically signed by Loralie Champagne MD Signature Date/Time: 02/01/2021/4:32:21 PM    Final    [place] EKG SITE RITE  Result Date: 02/12/2021 If Site Rite image not attached, placement could not be confirmed due to current cardiac rhythm.  [place] EKG SITE RITE  Result Date: 02/12/2021 If Site Rite image not attached, placement could not be confirmed due to current cardiac rhythm.    TODAY-DAY OF DISCHARGE:  Subjective:   Aariel Ems today has no headache,no chest abdominal pain,no new weakness tingling or numbness, feels much better wants to go home today.   Objective:   Blood pressure 107/73, pulse 81, temperature 98.1 F (36.7 C), temperature source Oral, resp. rate (!) 22, weight 39.8 kg, SpO2 97 %.  Intake/Output Summary (Last 24 hours) at 02/16/2021 1110 Last data filed at 02/16/2021 0800 Gross per 24 hour  Intake 313.98 ml  Output 300 ml  Net 13.98 ml   Filed Weights   02/14/21 0500 02/15/21 0044 02/16/21 0400  Weight: 43.4 kg 40.1 kg 39.8 kg    Exam: Awake Alert, Oriented *3, No new F.N deficits, Normal affect [place].AT,PERRAL Supple Neck,No JVD, No cervical lymphadenopathy appriciated.  Symmetrical Chest wall movement, Good air movement bilaterally, CTAB RRR,No Gallops,Rubs or new Murmurs, No Parasternal Heave +ve B.Sounds, Abd Soft, Non tender, No organomegaly appriciated, No rebound -guarding or rigidity. No Cyanosis, Clubbing or edema, No new Rash or bruise   PERTINENT RADIOLOGIC STUDIES: CARDIAC  CATHETERIZATION  Result Date: 02/15/2021 Findings: RA = 2 RV = 17/2 PA = 17/4 (8) PCW = 4 Fick cardiac output/index = 3.0/2.4 Thermo CO/CI = 2.8/2.2 PVR = 1.3 Ao sat = 98% PA sat = 64%, 65% Assessment: 1. Low filling pressures with moderately reduced cardiac output Plan/Discussion: Hydrate gently. No role for inotropic support at this time. Glori Bickers, MD 2:24 PM     PERTINENT LAB RESULTS: CBC: Recent Labs    02/15/21 1353 02/15/21 1354  HGB 11.6* 11.6*  HCT 34.0* 34.0*   CMET CMP     Component Value Date/Time   NA 129 (L) 02/16/2021 0352   K 3.3 (L) 02/16/2021 0352   CL 98 02/16/2021 0352   CO2 22 02/16/2021 0352   GLUCOSE 84 02/16/2021 0352   BUN 7 (L) 02/16/2021 0352   CREATININE 0.74 02/16/2021 0352   CALCIUM 8.4 (L) 02/16/2021 0352   PROT 5.6 (L) 02/15/2021 0650   ALBUMIN 2.9 (L) 02/15/2021 0650   AST 29 02/15/2021 0650   ALT 22 02/15/2021 0650   ALKPHOS 84 02/15/2021 0650   BILITOT 0.9 02/15/2021 0650   GFRNONAA >60 02/16/2021 0352    GFR Estimated Creatinine Clearance: 43.9 mL/min (by C-G formula based on SCr  of 0.74 mg/dL). No results for input(s): LIPASE, AMYLASE in the last 72 hours. No results for input(s): CKTOTAL, CKMB, CKMBINDEX, TROPONINI in the last 72 hours. Invalid input(s): POCBNP No results for input(s): DDIMER in the last 72 hours. No results for input(s): HGBA1C in the last 72 hours. No results for input(s): CHOL, HDL, LDLCALC, TRIG, CHOLHDL, LDLDIRECT in the last 72 hours. No results for input(s): TSH, T4TOTAL, T3FREE, THYROIDAB in the last 72 hours.  Invalid input(s): FREET3 No results for input(s): VITAMINB12, FOLATE, FERRITIN, TIBC, IRON, RETICCTPCT in the last 72 hours. Coags: No results for input(s): INR in the last 72 hours.  Invalid input(s): PT Microbiology: Recent Results (from the past 240 hour(s))  SARS CORONAVIRUS 2 (TAT 6-24 HRS) Nasopharyngeal Nasopharyngeal Swab     Status: Abnormal   Collection Time: 02/10/21 12:18 AM    Specimen: Nasopharyngeal Swab  Result Value Ref Range Status   SARS Coronavirus 2 POSITIVE (A) NEGATIVE Final    Comment: (NOTE) SARS-CoV-2 target nucleic acids are DETECTED.  The SARS-CoV-2 RNA is generally detectable in upper and lower respiratory specimens during the acute phase of infection. Positive results are indicative of the presence of SARS-CoV-2 RNA. Clinical correlation with patient history and other diagnostic information is  necessary to determine patient infection status. Positive results do not rule out bacterial infection or co-infection with other viruses.  The expected result is Negative.  Fact Sheet for Patients: SugarRoll.be  Fact Sheet for Healthcare Providers: https://www.woods-mathews.com/  This test is not yet approved or cleared by the [place] FDA and  has been authorized for detection and/or diagnosis of SARS-CoV-2 by FDA under an Emergency Use Authorization (EUA). This EUA will remain  in effect (meaning this test can be used) for the duration of the COVID-19 declaration under Section 564(b)(1) of the Act, 21 U. S.C. section 360bbb-3(b)(1), unless the authorization is terminated or revoked sooner.   Performed at [facility], [place] [address], [place], [place] [address]     FURTHER DISCHARGE INSTRUCTIONS:  Get Medicines reviewed and adjusted: Please take all your medications with you for your next visit with your Primary MD  Laboratory/radiological data: Please request your Primary MD to go over all hospital tests and procedure/radiological results at the follow up, please ask your Primary MD to get all Hospital records sent to his/her office.  In some cases, they will be blood work, cultures and biopsy results pending at the time of your discharge. Please request that your primary care M.D. goes through all the records of your hospital data and follows up on these results.  Also Note the  following: If you experience worsening of your admission symptoms, develop shortness of breath, life threatening emergency, suicidal or homicidal thoughts you must seek medical attention immediately by calling 911 or calling your MD immediately  if symptoms less severe.  You must read complete instructions/literature along with all the possible adverse reactions/side effects for all the Medicines you take and that have been prescribed to you. Take any new Medicines after you have completely understood and accpet all the possible adverse reactions/side effects.   Do not drive when taking Pain medications or sleeping medications (Benzodaizepines)  Do not take more than prescribed Pain, Sleep and Anxiety Medications. It is not advisable to combine anxiety,sleep and pain medications without talking with your primary care practitioner  Special Instructions: If you have smoked or chewed Tobacco  in the last 2 yrs please stop smoking, stop any regular Alcohol  and  or any Recreational drug use.  Wear Seat belts while driving.  Please note: You were cared for by a hospitalist during your hospital stay. Once you are discharged, your primary care physician will handle any further medical issues. Please note that NO REFILLS for any discharge medications will be authorized once you are discharged, as it is imperative that you return to your primary care physician (or establish a relationship with a primary care physician if you do not have one) for your post hospital discharge needs so that they can reassess your need for medications and monitor your lab values.  Total Time spent coordinating discharge including counseling, education and face to face time equals 35 minutes.  SignedOren Binet 02/16/2021 11:10 AM

## 2021-02-16 NOTE — Progress Notes (Signed)
Per RN staff-patient now much weaker than the past few days-PT re-eval completed-recommendations are for [facility] consulted SW. Hold discharge-will complete covid isolation on 5/20.  Son-[name] updated

## 2021-02-16 NOTE — TOC Transition Note (Addendum)
Transition of Care [facility]) - CM/SW Note Heart Failure   Patient Details  Name: SHELONDA SAXE MRN: [id number] Date of Birth: 09/29/1956  Transition of Care [facility]) CM/SW Contact:  [place], [place] Phone Number: 02/16/2021, 12:26 PM   Clinical Narrative:    CSW spoke with the patients nurse for the nurse to bring Ms. Peterkin an appointment card for the [facility] outpatient clinic and encouraged them to follow up and to attend the appointment and bring her medications and if anything changes to please reach out so that CSW/HV clinic team can provide support. CSW spoke with patients attending MD in the hallway who reported that the patient is now needing SNF and will not d/c today.  CSW received consult for possible SNF placement at time of discharge. CSW spoke with patient's son as CSW attempted to contact the patient and the patients sister but no answer. Patient's son expressed understanding of PT recommendation and is agreeable to SNF placement at time of discharge. Patient has not received the COVID vaccines and is currently COVID positive. Patient's son expressed being hopeful for rehab and for his mom to feel better soon. No further questions reported at this time.   TOC will continue to follow through discharge.   Final next level of care: Home/Self Care Barriers to Discharge: No Barriers Identified   Patient Goals and CMS Choice        Discharge Placement                       Discharge Plan and Services                DME Arranged: N/A DME Agency: NA                  Social Determinants of Health ([place]) Interventions     Readmission Risk Interventions Readmission Risk Prevention Plan 02/04/2021  Post Dischage Appt Complete  Transportation Screening Complete  Some recent data might be hidden    Kristeena Meineke, MSW, LCSWA [phone] Heart Failure Social Worker

## 2021-02-16 NOTE — Progress Notes (Signed)
Physical Therapy Treatment Patient Details Name: Colleen Fleming MRN: [id number] DOB: 1956/07/14 Today's Date: 02/16/2021    History of Present Illness Pt is a 65 y.o. female admitted 02/09/21 with diarrhea; found to have severe hyponatremia, (+) COVID-19. CXR negative for PNA. S/p RHC 5/16 which showed low filling pressures, moderately reduced CO. Of note, recent admission 01/30/21 with STEMI s/p cath with PCA. PMH includes lung CA, COPD, DJD, anxiety.   PT Comments    Pt requiring increased assist for mobility this session; tolerated brief bouts of standing activity, including short ambulation distance, requiring mod-maxA to maintain stability with RW. Pt limited by generalized weakness, decreased activity tolerance, poor balance strategies and cognitive impairment, including decreased awareness, difficulty problem solving. Recommend SNF-level therapies to maximize functional mobility and independence prior to return home; pt and family in agreement. Will continue to follow acutely to address established goals.  HR up to 130s with standing   Follow Up Recommendations  SNF;Supervision for mobility/OOB     Equipment Recommendations  None recommended by PT    Recommendations for Other Services       Precautions / Restrictions Precautions Precautions: Fall;Other (comment) Precaution Comments: (+) COVID; watch HR (tachycardia 5/17) Restrictions Weight Bearing Restrictions: No    Mobility  Bed Mobility Overal bed mobility: Needs Assistance Bed Mobility: Supine to Sit;Sit to Supine     Supine to sit: Min assist Sit to supine: Mod assist   General bed mobility comments: MinA for trunk elevation to sit EOB; pt returning to sidelying keeping feet on floor, cues to correct posture but pt just scooting trunk back further in bed, eventually requiring modA for safe BLE management    Transfers Overall transfer level: Needs assistance Equipment used: Rolling walker (2 wheeled) Transfers:  Sit to/from Stand Sit to Stand: Min assist;Mod assist         General transfer comment: Multiple sit<>stands from EOB to RW fluctuating from min-modA for trunk elevation and stability; pt with poor safety awareness, especially with return to sit; attempt to sit prematurely requiring maxA to prevent fall; max verbal cues for sequencing and safety  Ambulation/Gait Ambulation/Gait assistance: Min assist;Mod assist Gait Distance (Feet): 4 Feet Assistive device: Rolling walker (2 wheeled) Gait Pattern/deviations: Step-through pattern;Decreased stride length;Trunk flexed;Narrow base of support;Leaning posteriorly     General Gait Details: Very unsteady gait with poor safety awareness; pt initially taking one large step forward with bilateral knee instability requiring modA to maintain balance; max verbal cues for sequencing/safety; pt stepping back towards bed and attempting to sit prematurely; return to standing for side steps at EOB, pt frequently removing hand from RW, modA for stability and RW management   Stairs             Wheelchair Mobility    Modified Rankin (Stroke Patients Only)       Balance Overall balance assessment: Needs assistance Sitting-balance support: No upper extremity supported;Feet supported Sitting balance-Leahy Scale: Fair     Standing balance support: Bilateral upper extremity supported;Single extremity supported;During functional activity Standing balance-Leahy Scale: Poor Standing balance comment: Reliant on UE support and external assist                            Cognition Arousal/Alertness: Awake/alert Behavior During Therapy: WFL for tasks assessed/performed Overall Cognitive Status: History of cognitive impairments - at baseline Area of Impairment: Attention;Memory;Following commands;Safety/judgement;Awareness;Problem solving  Current Attention Level: Sustained;Selective Memory: Decreased short-term  memory Following Commands: Follows one step commands inconsistently;Follows one step commands with increased time Safety/Judgement: Decreased awareness of safety;Decreased awareness of deficits Awareness: Intellectual;Emergent Problem Solving: Slow processing;Decreased initiation;Difficulty sequencing;Requires verbal cues General Comments: pt with decreased awareness/attention and poor problem solving. Sister reports baseline cognitive impairment      Exercises      General Comments General comments (skin integrity, edema, etc.): Pt and pt's sister agreeable with recommendation for post-acute rehab at SNF based on changes in pt's current functional mobility status, unsafe to return home at this point      Pertinent Vitals/Pain Pain Assessment: No/denies pain Pain Intervention(s): Monitored during session    Home Living                      Prior Function            PT Goals (current goals can now be found in the care plan section) Acute Rehab PT Goals Patient Stated Goal: Agreeable to post-acute rehab at SNF PT Goal Formulation: With patient/family Time For Goal Achievement: 03/02/21 Potential to Achieve Goals: Good Progress towards PT goals: Not progressing toward goals - comment (fatigue, weakness)    Frequency    Min 2X/week      PT Plan Discharge plan needs to be updated;Frequency needs to be updated    Co-evaluation              AM-PAC PT "6 Clicks" Mobility   Outcome Measure  Help needed turning from your back to your side while in a flat bed without using bedrails?: A Little Help needed moving from lying on your back to sitting on the side of a flat bed without using bedrails?: A Lot Help needed moving to and from a bed to a chair (including a wheelchair)?: A Lot Help needed standing up from a chair using your arms (e.g., wheelchair or bedside chair)?: A Lot Help needed to walk in hospital room?: A Lot Help needed climbing 3-5 steps with a  railing? : A Lot 6 Click Score: 13    End of Session Equipment Utilized During Treatment: Gait belt Activity Tolerance: Patient limited by fatigue Patient left: in bed;with call bell/phone within reach;with bed alarm set;with nursing/sitter in room Nurse Communication: Mobility status PT Visit Diagnosis: Unsteadiness on feet (R26.81);Other abnormalities of gait and mobility (R26.89)     Time: 2244-9753 PT Time Calculation (min) (ACUTE ONLY): 19 min  Charges:  $Therapeutic Activity: 8-22 mins                     Mabeline Caras, PT, DPT Acute Rehabilitation Services  Pager [phone] Office [place] 02/16/2021, 1:27 PM

## 2021-02-16 NOTE — Progress Notes (Addendum)
Advanced Heart Failure Rounding Note   Subjective:    Echo 02/13/21 EF 20-25% moderate pericardial effusion. No overt tamponade.   RHC yesterday showed low filling pressures w/ moderately reduced CO.   Wt continues to trend down.   Na 129 K 3.3    Remains on COVID precautions.    RHC Findings:  RA = 2 RV = 17/2 PA = 17/4 (8) PCW = 4 Fick cardiac output/index = 3.0/2.4 Thermo CO/CI = 2.8/2.2 PVR = 1.3 Ao sat = 98% PA sat = 64%, 65%    Objective:   Weight Range:  Vital Signs:   Temp:  [97.6 F (36.4 C)-98.2 F (36.8 C)] 98.1 F (36.7 C) (05/17 0800) Pulse Rate:  [47-135] 81 (05/17 0749) Resp:  [14-32] 22 (05/17 0749) BP: (91-146)/(62-100) 107/73 (05/17 0749) SpO2:  [83 %-100 %] 97 % (05/17 0749) Weight:  [39.8 kg] 39.8 kg (05/17 0400) Last BM Date: 02/11/21  Weight change: Filed Weights   02/14/21 0500 02/15/21 0044 02/16/21 0400  Weight: 43.4 kg 40.1 kg 39.8 kg    Intake/Output:   Intake/Output Summary (Last 24 hours) at 02/16/2021 1013 Last data filed at 02/16/2021 0800 Gross per 24 hour  Intake 313.98 ml  Output 300 ml  Net 13.98 ml    PHYSICAL EXAM: General:  Thin/ Frail appearing. No respiratory difficulty HEENT: normal Neck: supple. no JVD. Carotids 2+ bilat; no bruits. No lymphadenopathy or thyromegaly appreciated. Cor: PMI nondisplaced. Regular rate & rhythm. No rubs, gallops or murmurs. Lungs: clear Abdomen: soft, nontender, nondistended. No hepatosplenomegaly. No bruits or masses. Good bowel sounds. Extremities: no cyanosis, clubbing, rash, edema Neuro: alert & oriented x 3, cranial nerves grossly intact. moves all 4 extremities w/o difficulty. Affect pleasant.   Telemetry:  NSR 80s Personally reviewed  Labs: Basic Metabolic Panel: Recent Labs  Lab 02/10/21 0034 02/10/21 0226 02/10/21 0900 02/12/21 0544 02/12/21 2043 02/13/21 0319 02/14/21 0550 02/15/21 0650 02/15/21 1353 02/15/21 1354 02/16/21 0352  NA 121* 121*    < > 123* 125* 125* 126* 128* 130* 129* 129*  K  --  3.1*   < > 4.6 4.2 3.7 3.7 3.5 3.3* 3.4* 3.3*  CL  --  91*   < > 93* 95* 96* 97* 98  --   --  98  CO2  --  20*   < > 22 24 21* 21* 22  --   --  22  GLUCOSE  --  118*   < > 93 107* 97 96 95  --   --  84  BUN  --  5*   < > 8 12 10 8  7*  --   --  7*  CREATININE  --  0.76   < > 0.76 0.75 0.69 0.66 0.68  --   --  0.74  CALCIUM  --  7.9*   < > 8.5* 7.7* 7.8* 8.3* 8.4*  --   --  8.4*  MG 1.6* 1.5*  --  1.9  --   --   --   --   --   --   --   PHOS  --  3.1  --   --   --   --   --   --   --   --   --    < > = values in this interval not displayed.    Liver Function Tests: Recent Labs  Lab 02/11/21 0914 02/12/21 0544 02/13/21 0319 02/14/21 0550 02/15/21 0650  AST  27 23 23 25 29   ALT 24 23 19 20 22   ALKPHOS 81 77 66 78 84  BILITOT 0.4 0.6 0.7 0.8 0.9  PROT 5.9* 5.8* 5.2* 5.6* 5.6*  ALBUMIN 3.2* 3.1* 2.8* 2.9* 2.9*   No results for input(s): LIPASE, AMYLASE in the last 168 hours. No results for input(s): AMMONIA in the last 168 hours.  CBC: Recent Labs  Lab 02/09/21 2248 02/10/21 0226 02/11/21 0914 02/15/21 1353 02/15/21 1354  WBC 4.1 3.8* 7.4  --   --   NEUTROABS  --   --  5.7  --   --   HGB 11.5* 10.6* 12.4 11.6* 11.6*  HCT 32.7* 30.9* 35.0* 34.0* 34.0*  MCV 91.3 92.8 91.1  --   --   PLT 282 256 279  --   --     Cardiac Enzymes: No results for input(s): CKTOTAL, CKMB, CKMBINDEX, TROPONINI in the last 168 hours.  BNP: BNP (last 3 results) Recent Labs    02/01/21 1100  BNP 792.9*    ProBNP (last 3 results) No results for input(s): PROBNP in the last 8760 hours.    Other results:  Imaging: CARDIAC CATHETERIZATION  Result Date: 02/15/2021 Findings: RA = 2 RV = 17/2 PA = 17/4 (8) PCW = 4 Fick cardiac output/index = 3.0/2.4 Thermo CO/CI = 2.8/2.2 PVR = 1.3 Ao sat = 98% PA sat = 64%, 65% Assessment: 1. Low filling pressures with moderately reduced cardiac output Plan/Discussion: Hydrate gently. No role for  inotropic support at this time. Glori Bickers, MD 2:24 PM     Medications:     Scheduled Medications: . aspirin EC  81 mg Oral Daily  . atorvastatin  80 mg Oral Daily  . Chlorhexidine Gluconate Cloth  6 each Topical Daily  . enoxaparin (LOVENOX) injection  20 mg Subcutaneous Q24H  . midodrine  10 mg Oral TID WC  . potassium chloride  40 mEq Oral Q4H  . sodium chloride flush  10-40 mL Intracatheter Q12H  . ticagrelor  90 mg Oral BID    Infusions:   PRN Medications: acetaminophen, melatonin, nitroGLYCERIN, prochlorperazine, sodium chloride flush   Assessment/Plan:   1. Hyponatremia  - Na 117 on admit. Due to hypovolemia in asetting of diarrhea-improving with IV fluids.  - 123 -> 125 ->126->128->129 today  - Filling pressures low on RHC yesterday - can hydrate gently w/ sodium chloride  - Restrict free water   2. Hypokalemia - K 2.8 on admit, due to GI loses + diuretic use - 3.3 today - KCl ordered   3. COVID-19 Infection - no respiratory symptoms - Remdesivir x 3 days   4. Diarrhea - suspect due to COVID 19  - GI panel and C-diff pending per primary   5. CAD w/ Recent Anterior STEMI  - Admitted earlier this month w/ STEMI - cath w/ 99% pLAD occlusion treated w/ PCI/ DES + 80% dLAD to be treated medically - stable w/o CP. Admit EKG w/ no ischemic changes - c/w DAPT w/ ASA + Brilinta  - c/w statin   6. 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 No MR. RV normal.  - Echo 02/13/21 EF 20-25% moderate pericardial effusion. No overt tamponade. - Remains on midodrine 10 mg tid to support BP  - BP is low but does not appear to be in overt cardiogenic shock. RHC yesterday w/ marginal  CO 2.2. No indication for inotropes.  - Filling pressures low on RHC, can hydrate gently w/ IVFs.    7. H/o Lung Cancer - s/p chemo +  radiation. Continues to smoke. CXR prior admit showed atraumatic rib fx.   8. Pericardial Effusion  - Moderate on echo but not tamponade   Length of Stay: [place] [name]  02/16/2021, 10:13 AM  Advanced Heart Failure Team Pager [phone] (M-F; 7a - 4p)  Please contact [facility] for night-coverage after hours (4p -7a ) and weekends on [email or web address]   Patient seen and examined with the above-signed Advanced Practice Provider and/or Housestaff. I personally reviewed laboratory data, imaging studies and relevant notes. I independently examined the patient and formulated the important aspects of the plan. I have edited the note to reflect any of my changes or salient points. I have personally discussed the plan with the patient and/or family.  RHC numbers reviewed. Cardiac output sufficient. Filling pressures low. No SOB. Wants to go home. SNA 129-130.   General:  Weak appearing. No resp difficulty HEENT: normal Neck: supple. no JVD. Carotids 2+ bilat; no bruits. No lymphadenopathy or thryomegaly appreciated. Cor: PMI nondisplaced. Regular rate & rhythm. No rubs, gallops or murmurs. Lungs: clear Abdomen: soft, nontender, nondistended. No hepatosplenomegaly. No bruits or masses. Good bowel sounds. Extremities: no cyanosis, clubbing, rash, edema Neuro: alert & orientedx3, cranial nerves grossly intact. moves all 4 extremities w/o difficulty. Affect pleasant  Remains tenuous but stable from HF perspective. GDMT limited by low BP and volume depletion.   OK to d/c home from HF standpoint with close f/u once K supped,   Home cardiac meds  ECASA 81 Brilinta Midodrine 10 tid  Atorva 80 daily.  Digoxin 0.125 daily  No b-blocker or spiro with low BP.   Glori Bickers, MD  10:29 AM

## 2021-02-16 NOTE — NC FL2 (Signed)
[place] LEVEL OF CARE SCREENING TOOL     IDENTIFICATION  Patient Name: Colleen Fleming Birthdate: [birth year] Sex: female Admission Date (Current Location): 02/09/2021  [facility] and [place] Number:  Herbalist and Address:  [facility] [place] [place] [address] [place], [place] [address]      Provider Number: [id number]  Attending Physician Name and Address:  Jonetta Osgood, MD  Relative Name and Phone Number:  Jadia, Capers   [phone]    Current Level of Care: Hospital Recommended Level of Care: [place] Prior Approval Number:    Date Approved/Denied:   PASRR Number: [id number]  Discharge Plan: SNF    Current Diagnoses: Patient Active Problem List   Diagnosis Date Noted  . Hyponatremia 02/10/2021  . STEMI (ST elevation myocardial infarction) ([place]) 01/30/2021  . Acute ST elevation myocardial infarction (STEMI) involving left anterior descending (LAD) coronary artery ([place])   . Hypokalemia   . Panlobular emphysema ([place])   . Coronary artery disease involving native coronary artery of native heart with unstable angina pectoris ([place])   . GERD (gastroesophageal reflux disease) 11/25/2011  . Nicotine abuse 11/25/2011  . Hyperlipemia 11/25/2011  . IBS (irritable bowel syndrome) 11/25/2011  . Anxiety as acute reaction to exceptional stress 11/25/2011    Orientation RESPIRATION BLADDER Height & Weight     Time,Self,Situation,Place  Normal Incontinent,External catheter Weight: 87 lb 11.9 oz (39.8 kg) Height:     BEHAVIORAL SYMPTOMS/MOOD NEUROLOGICAL BOWEL NUTRITION STATUS      Continent Diet (see d/c summary)  AMBULATORY STATUS COMMUNICATION OF NEEDS Skin   Extensive Assist Verbally Normal                       Personal Care Assistance Level of Assistance  Feeding,Bathing,Dressing Bathing Assistance: Maximum assistance Feeding assistance: Independent (able to feed self) Dressing Assistance: Limited  assistance     Functional Limitations Info  Sight,Hearing,Speech Sight Info: Adequate Hearing Info: Adequate Speech Info: Adequate    SPECIAL CARE FACTORS FREQUENCY  PT (By licensed PT),OT (By licensed OT)     PT Frequency: 5 X per week OT Frequency: 5 X per week            Contractures Contractures Info: Not present    Additional Factors Info  Code Status,Allergies,Isolation Precautions Code Status Info: Full Allergies Info: Penicillins     Isolation Precautions Info: [id number] + on  02/10/21     Current Medications (02/16/2021):  This is the current hospital active medication list Current Facility-Administered Medications  Medication Dose Route Frequency Provider Last Rate Last Admin  . acetaminophen (TYLENOL) tablet 650 mg  650 mg Oral Q6H PRN Irene Pap N, DO      . aspirin EC tablet 81 mg  81 mg Oral Daily Irene Pap N, DO   81 mg at 02/16/21 0813  . atorvastatin (LIPITOR) tablet 80 mg  80 mg Oral Daily Irene Pap N, DO   80 mg at 02/16/21 0813  . Chlorhexidine Gluconate Cloth 2 % PADS 6 each  6 each Topical Daily Jonetta Osgood, MD   6 each at 02/15/21 1049  . digoxin (LANOXIN) tablet 0.125 mg  0.125 mg Oral Daily Bensimhon, Shaune Pascal, MD   0.125 mg at 02/16/21 1310  . enoxaparin (LOVENOX) 100 mg/mL injection 20 mg  20 mg Subcutaneous Q24H Jonetta Osgood, MD   20 mg at 02/15/21 1723  . melatonin tablet 3 mg  3 mg Oral QHS PRN Irene Pap N, DO   3 mg at 02/15/21 2215  . midodrine (PROAMATINE) tablet 10 mg  10 mg Oral TID WC Ghimire, Henreitta Leber, MD   10 mg at 02/16/21 1232  . nitroGLYCERIN (NITROSTAT) SL tablet 0.4 mg  0.4 mg Sublingual Q5 Min x 3 PRN Hall, Carole N, DO      . potassium chloride SA (KLOR-CON) CR tablet 40 mEq  40 mEq Oral Q4H Jonetta Osgood, MD   40 mEq at 02/16/21 1233  . prochlorperazine (COMPAZINE) injection 10 mg  10 mg Intravenous Q6H PRN Irene Pap N, DO   10 mg at 02/13/21 0316  . sodium chloride flush (NS) 0.9 % injection  10-40 mL  10-40 mL Intracatheter Q12H Ghimire, Henreitta Leber, MD   10 mL at 02/16/21 0814  . sodium chloride flush (NS) 0.9 % injection 10-40 mL  10-40 mL Intracatheter PRN Ghimire, Henreitta Leber, MD      . ticagrelor [facility]) tablet 90 mg  90 mg Oral BID Kayleen Memos, DO   90 mg at 02/16/21 6160     Discharge Medications: Please see discharge summary for a list of discharge medications.  Relevant Imaging Results:  Relevant Lab Results:   Additional Information SS#: [id number] unvaccinated  [place]

## 2021-02-17 DIAGNOSIS — E871 Hypo-osmolality and hyponatremia: Secondary | ICD-10-CM | POA: Diagnosis not present

## 2021-02-17 DIAGNOSIS — I5022 Chronic systolic (congestive) heart failure: Secondary | ICD-10-CM | POA: Diagnosis not present

## 2021-02-17 LAB — BASIC METABOLIC PANEL
Anion gap: 7 (ref 5–15)
BUN: 9 mg/dL (ref 8–23)
CO2: 27 mmol/L (ref 22–32)
Calcium: 8.6 mg/dL — ABNORMAL LOW (ref 8.9–10.3)
Chloride: 97 mmol/L — ABNORMAL LOW (ref 98–111)
Creatinine, Ser: 0.78 mg/dL (ref 0.44–1.00)
GFR, Estimated: >60 mL/min (ref >60)
Glucose, Bld: 97 mg/dL (ref 70–99)
Potassium: 4.2 mmol/L (ref 3.5–5.1)
Sodium: 131 mmol/L — ABNORMAL LOW (ref 135–145)

## 2021-02-17 NOTE — TOC Progression Note (Signed)
Transition of Care (TOC) - Progression Note  Heart Failure  Patient Details  Name: Colleen Fleming MRN: [id number] Date of Birth: 10-15-1955  Transition of Care [facility]) CM/SW [place], [place] Phone Number: 02/17/2021, 3:30 PM  Clinical Narrative:    CSW called the 6 SNF's who extended a bed offer to see which facilities will take COVID patients. [place] and [facility] reported they can take [name] on the 11th day of her isolation period. CSW spoke with the patients son [name] [phone] to discuss the bed offers and emailed him the medicare rating list to review and to get back to the CSW as soon as possible so that the insurance authorization process can be initiated.   TOC will continue to follow for discharge needs.   Expected Discharge Plan: [place] Barriers to Discharge: SNF Covid,SNF Covid Recovering  Expected Discharge Plan and Services Expected Discharge Plan: [place] In-house Referral: Clinical Social Work Discharge Planning Services: CM Consult Post Acute Care Choice: [place] arrangements for the past 2 months: [place] Expected Discharge Date: 02/16/21               DME Arranged: N/A DME Agency: NA                   Social Determinants of Health ([place]) Interventions    Readmission Risk Interventions Readmission Risk Prevention Plan 02/04/2021  Post Dischage Appt Complete  Transportation Screening Complete  Some recent data might be hidden   Semiah Konczal, MSW, LCSWA [phone] Heart Failure Social Worker

## 2021-02-17 NOTE — Progress Notes (Signed)
Physical Therapy Treatment Patient Details Name: Colleen Fleming MRN: [id number] DOB: [birth year] Today's Date: 02/17/2021    History of Present Illness Pt is a 65 y.o. female admitted 02/09/21 with diarrhea; found to have severe hyponatremia, (+) COVID-19. CXR negative for PNA. S/p RHC 5/16 which showed low filling pressures, moderately reduced CO. Of note, recent admission 01/30/21 with STEMI s/p cath with PCA. PMH includes lung CA, COPD, DJD, anxiety.    PT Comments    Pt received in supine, lethargic and agreeable to limited therapy session with max encouragement. Pt  Demonstrates a significant functional decline and she needed totalA to stand at bedside ~10 seconds and was unable to take steps. Pt seems more confused than previous PT session (per chart review) as well, MD/RN notified. May benefit from TED hose to see if this helps with hemodynamics due to significant BP drop after standing. Pt continues to benefit from PT services to progress toward functional mobility goals, may need goal reassessment next session if still unable to progress gait. Continue to recommend SNF.     Follow Up Recommendations  SNF;Supervision for mobility/OOB     Equipment Recommendations  None recommended by PT (continue to assess pending disp)    Recommendations for Other Services       Precautions / Restrictions Precautions Precautions: Fall;Other (comment) Precaution Comments: (+) COVID; watch HR (tachycardia 5/17) Restrictions Weight Bearing Restrictions: No    Mobility  Bed Mobility Overal bed mobility: Needs Assistance Bed Mobility: Rolling;Sidelying to Sit;Sit to Sidelying Rolling: Max assist;+2 for safety/equipment Sidelying to sit: Max assist;+2 for physical assistance     Sit to sidelying: Max assist;+2 for safety/equipment General bed mobility comments: single step cues for rolling to side and pt needs hand over hand assist to perform and trunk/ hip assist; poor initiation and slow  processing. +2 assist to achieve upright seated posture and for seated balance    Transfers Overall transfer level: Needs assistance Equipment used: Rolling walker (2 wheeled) Transfers: Sit to/from Stand Sit to Stand: Max assist;Total assist;+2 physical assistance;+2 safety/equipment         General transfer comment: from EOB, pt very lethargic and only agreeable to stand once from EOB for changing of bed pad underneath her which had gotten wet from urine; pt needs maxA to rise then totalA for steadying and tends to fall whichever way she leans/zero balance and knees buckling after ~10 seconds upright (orthostatic? unable to assess standing BP)  Ambulation/Gait             General Gait Details: Not tested, pt unable to weight shift for steps this date and knees buckling   Stairs             Wheelchair Mobility    Modified Rankin (Stroke Patients Only)       Balance Overall balance assessment: Needs assistance Sitting-balance support: Feet supported;Bilateral upper extremity supported Sitting balance-Leahy Scale: Poor Sitting balance - Comments: minimal effort to remain upright; needs mod/maxA trunk support and cues for UE support   Standing balance support: Bilateral upper extremity supported Standing balance-Leahy Scale: Zero Standing balance comment: Reliant on UE support and max to totalA                            Cognition Arousal/Alertness: Lethargic Behavior During Therapy: Flat affect Overall Cognitive Status: No family/caregiver present to determine baseline cognitive functioning Area of Impairment: Attention;Memory;Following commands;Safety/judgement;Awareness;Problem solving;Orientation  Orientation Level: Disoriented to;Situation;Time Current Attention Level: Sustained Memory: Decreased short-term memory Following Commands: Follows one step commands inconsistently;Follows one step commands with increased  time Safety/Judgement: Decreased awareness of safety;Decreased awareness of deficits Awareness: Intellectual Problem Solving: Slow processing;Decreased initiation;Difficulty sequencing;Requires verbal cues;Requires tactile cues General Comments: pt with decreased awareness/attention and poor problem solving. Sister reports baseline cognitive impairment but pt with significant decline past few days and with poor command following/initiation compared with previous week. pt states "I need my remote" when call bell and landline phone given to her she states "y'all are idiots these aren't my remote" and when therapist asked "do you mean a cellphone?" she again states "I need my remote" (pt was referring to cellphone).      Exercises      General Comments General comments (skin integrity, edema, etc.): BP 128/73 (88) seated EOB taken in R leg and 92/68 (77) supine taken after standing, pt likely orthostatic but unable to stand for standing BP assessment, RN notified; HR tachy to 110 bpm seated and 130 bpm standing; may benefit from B TED hose for hemodynamic stability      Pertinent Vitals/Pain Pain Assessment: No/denies pain    Home Living                      Prior Function            PT Goals (current goals can now be found in the care plan section) Acute Rehab PT Goals Patient Stated Goal: Agreeable to post-acute rehab at SNF PT Goal Formulation: With patient/family Time For Goal Achievement: 03/02/21 Potential to Achieve Goals: Good Progress towards PT goals: Not progressing toward goals - comment (lethargy/weakness/cognition)    Frequency    Min 2X/week      PT Plan Current plan remains appropriate       AM-PAC PT "6 Clicks" Mobility   Outcome Measure  Help needed turning from your back to your side while in a flat bed without using bedrails?: A Lot Help needed moving from lying on your back to sitting on the side of a flat bed without using bedrails?: A  Lot Help needed moving to and from a bed to a chair (including a wheelchair)?: Total Help needed standing up from a chair using your arms (e.g., wheelchair or bedside chair)?: Total Help needed to walk in hospital room?: Total Help needed climbing 3-5 steps with a railing? : Total 6 Click Score: 8    End of Session Equipment Utilized During Treatment: Gait belt Activity Tolerance: Patient limited by fatigue;Patient limited by lethargy Patient left: in bed;with call bell/phone within reach;with bed alarm set (NT notified she needs new mesh briefs and maybe new purewick) Nurse Communication: Mobility status;Other (comment) (likely orthostatic hypotension, pt lethargy/poor participation. MD also notified of functional decline) PT Visit Diagnosis: Unsteadiness on feet (R26.81);Other abnormalities of gait and mobility (R26.89)     Time: 1449-1510 PT Time Calculation (min) (ACUTE ONLY): 21 min  Charges:  $Therapeutic Activity: 8-22 mins                     Tammie Ellsworth P., PTA Acute Rehabilitation Services Pager: [phone] Office: [place] 02/17/2021, 5:19 PM

## 2021-02-17 NOTE — Progress Notes (Signed)
Advanced Heart Failure Rounding Note   Subjective:    Discharge held yesterday due to weakness. Pending SNF.  Denies CP or SOB. Wants to go home.    Objective:   Weight Range:  Vital Signs:   Temp:  [97.3 F (36.3 C)-98.1 F (36.7 C)] 98.1 F (36.7 C) (05/18 0400) Pulse Rate:  [76-97] 80 (05/18 0700) Resp:  [20-24] 21 (05/18 0700) BP: (95-112)/(64-66) 112/64 (05/18 0700) SpO2:  [96 %-99 %] 98 % (05/18 0700) Weight:  [40.9 kg] 40.9 kg (05/18 0400) Last BM Date: 02/15/21  Weight change: Filed Weights   02/15/21 0044 02/16/21 0400 02/17/21 0400  Weight: 40.1 kg 39.8 kg 40.9 kg    Intake/Output:   Intake/Output Summary (Last 24 hours) at 02/17/2021 0908 Last data filed at 02/17/2021 0000 Gross per 24 hour  Intake 240 ml  Output 200 ml  Net 40 ml    PHYSICAL EXAM: General:  Thin/ Frail appearing. No respiratory difficulty General:  Well appearing. No resp difficulty HEENT: normal Neck: supple. no JVD. Carotids 2+ bilat; no bruits. No lymphadenopathy or thryomegaly appreciated. Cor: PMI nondisplaced. Regular rate & rhythm. No rubs, gallops or murmurs. Lungs: clear Abdomen: soft, nontender, nondistended. No hepatosplenomegaly. No bruits or masses. Good bowel sounds. Extremities: no cyanosis, clubbing, rash, edema Neuro: alert & orientedx3, cranial nerves grossly intact. moves all 4 extremities w/o difficulty. Affect pleasant    Telemetry:  NSR 80s Personally reviewed  Labs: Basic Metabolic Panel: Recent Labs  Lab 02/12/21 0544 02/12/21 2043 02/13/21 0319 02/14/21 0550 02/15/21 0650 02/15/21 1353 02/15/21 1354 02/16/21 0352 02/17/21 0703  NA 123*   < > 125* 126* 128* 130* 129* 129* 131*  K 4.6   < > 3.7 3.7 3.5 3.3* 3.4* 3.3* 4.2  CL 93*   < > 96* 97* 98  --   --  98 97*  CO2 22   < > 21* 21* 22  --   --  22 27  GLUCOSE 93   < > 97 96 95  --   --  84 97  BUN 8   < > 10 8 7*  --   --  7* 9  CREATININE 0.76   < > 0.69 0.66 0.68  --   --  0.74 0.78   CALCIUM 8.5*   < > 7.8* 8.3* 8.4*  --   --  8.4* 8.6*  MG 1.9  --   --   --   --   --   --   --   --    < > = values in this interval not displayed.    Liver Function Tests: Recent Labs  Lab 02/11/21 0914 02/12/21 0544 02/13/21 0319 02/14/21 0550 02/15/21 0650  AST 27 23 23 25 29   ALT 24 23 19 20 22   ALKPHOS 81 77 66 78 84  BILITOT 0.4 0.6 0.7 0.8 0.9  PROT 5.9* 5.8* 5.2* 5.6* 5.6*  ALBUMIN 3.2* 3.1* 2.8* 2.9* 2.9*   No results for input(s): LIPASE, AMYLASE in the last 168 hours. No results for input(s): AMMONIA in the last 168 hours.  CBC: Recent Labs  Lab 02/11/21 0914 02/15/21 1353 02/15/21 1354  WBC 7.4  --   --   NEUTROABS 5.7  --   --   HGB 12.4 11.6* 11.6*  HCT 35.0* 34.0* 34.0*  MCV 91.1  --   --   PLT 279  --   --     Cardiac Enzymes: No results  for input(s): CKTOTAL, CKMB, CKMBINDEX, TROPONINI in the last 168 hours.  BNP: BNP (last 3 results) Recent Labs    02/01/21 1100  BNP 792.9*    ProBNP (last 3 results) No results for input(s): PROBNP in the last 8760 hours.    Other results:  Imaging: CARDIAC CATHETERIZATION  Result Date: 02/15/2021 Findings: RA = 2 RV = 17/2 PA = 17/4 (8) PCW = 4 Fick cardiac output/index = 3.0/2.4 Thermo CO/CI = 2.8/2.2 PVR = 1.3 Ao sat = 98% PA sat = 64%, 65% Assessment: 1. Low filling pressures with moderately reduced cardiac output Plan/Discussion: Hydrate gently. No role for inotropic support at this time. Glori Bickers, MD 2:24 PM     Medications:     Scheduled Medications: . aspirin EC  81 mg Oral Daily  . atorvastatin  80 mg Oral Daily  . Chlorhexidine Gluconate Cloth  6 each Topical Daily  . digoxin  0.125 mg Oral Daily  . enoxaparin (LOVENOX) injection  20 mg Subcutaneous Q24H  . midodrine  10 mg Oral TID WC  . sodium chloride flush  10-40 mL Intracatheter Q12H  . ticagrelor  90 mg Oral BID    Infusions:   PRN Medications: acetaminophen, melatonin, nitroGLYCERIN, prochlorperazine, sodium  chloride flush   Assessment/Plan:   1. Hyponatremia  - Na 117 on admit. Due to hypovolemia in asetting of diarrhea-improving with IV fluids.  - 123 -> 125 ->126->128->129 -> 131 today  - Filling pressures low on RHC  - Continue to restrict free water   2. Hypokalemia - k 4.2  3. COVID-19 Infection - no respiratory symptoms - Remdesivir x 3 days   4. Diarrhea - suspect due to COVID 19  - GI panel and C-diff pending per primary   5. CAD w/ Recent Anterior STEMI  - Admitted earlier this month w/ STEMI - cath w/ 99% pLAD occlusion treated w/ PCI/ DES + 80% dLAD to be treated medically - stable w/o CP. Admit EKG w/ no ischemic changes - c/w DAPT w/ ASA + Brilinta  - c/w statin   6. 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 No MR. RV normal.  - Echo 02/13/21 EF 20-25% moderate pericardial effusion. No overt tamponade. - Remains on midodrine 10 mg tid to support BP  - RHC ok  - Ok for d/c to SNF form our standpoint  ECASA 81 Brilinta Midodrine 10 tid  Atorva 80 daily.  Digoxin 0.125 daily Lasix 20 mg daily and K 20 mg only for weight 95 pounds or greater  No b-blocker or spiro with low BP. May be able to restart spiro as outpatient    7. H/o Lung Cancer - s/p chemo + radiation. Continues to smoke. CXR prior admit showed atraumatic rib fx.   8. Pericardial Effusion  - Moderate on echo but no tamponade   Length of Stay: 7   Jordis Repetto MD 02/17/2021, 9:08 AM  Advanced Heart Failure Team Pager [phone] (M-F; 7a - 4p)  Please contact [facility] for night-coverage after hours (4p -7a ) and weekends on [email or web address]

## 2021-02-17 NOTE — Progress Notes (Signed)
PROGRESS NOTE                                                                                                                                                                                                             Patient Demographics:    Colleen Fleming, is a 65 y.o. female, DOB - 1956/03/06, XNT:[id number]  Outpatient Primary MD for the patient is Belva Bertin, [place]   Admit date - 02/09/2021   LOS - 7  Chief Complaint  Patient presents with  . Weakness       Brief Narrative: Patient is a 65 y.o. female with PMHx of CAD-recent PCI, chronic systolic heart failure-presented with diarrhea-found to have severe hyponatremia.  Found to have COVID-19 infection.  COVID-19 vaccinated status:  Significant Events: 5/10>> Admit to [facility] for diarrhea-vit D-found to have hyponatremia-sodium 117. 5/16>> underwent RHC-low filling pressures, moderately decreased cardiac output. 5/17>> plan to discharge-however worsening weakness-PT now recommending SNF.  Discharge held.  Significant studies: 5/10>>Chest x-ray: No pneumonia  COVID-19 medications: Remdesivir:5/11>> 5/13  Antibiotics: None  Microbiology data: None  Procedures: 5/16>> RHC: RA = 2 RV = 17/2 PA = 17/4 (8) PCW = 4 Fick cardiac output/index = 3.0/2.4 Thermo CO/CI = 2.8/2.2 PVR = 1.3 Ao sat = 98% PA sat = 64%, 65  Consults: Advanced heart failure team.  DVT prophylaxis: Place TED hose Start: 02/12/21 1320 Place and maintain sequential compression device Start: 02/10/21 0202    Subjective:   No major issues overnight-no chest pain or shortness of breath.   Assessment  & Plan :   Hyponatremia: Felt to be due to hypovolemia in the setting of COVID-19 related gastroenteritis/diarrhea.  She initially was treated with IV fluids-she is now volume replete.  Sodium level slowly improving with just fluid restriction.  Persistent mild hyponatremia now  is felt to be from underlying CHF.  Hypokalemia: Due to GI losses-resolved.  Diarrhea: Likely from COVID 19 infection-resolved.  COVID-19 infection: Likely causing diarrhea-on Remdesivir x3 days.  Needs a total of 10 days of isolation from 5/11.  Hypotension: Due to combination of hypovolemia and underlying chronic systolic heart failure.  BP stabilizing on midodrine.    Recent STEMI-s/p PCI: No anginal symptoms-on antiplatelets/statin  Chronic systolic heart failure (EF 25% on echo 5/2): Volume status stable-see above.  No beta-blocker or spironolactone due to low  BP.  Tolerating digoxin.  Will need outpatient follow-up with CHF team for addition of other medications.  COVID-19 Labs: No results for input(s): DDIMER, FERRITIN, LDH, CRP in the last 72 hours.     Component Value Date/Time   BNP 792.9 (H) 02/01/2021 1100    Recent Labs  Lab 02/10/21 1629  PROCALCITON <0.10    Lab Results  Component Value Date   SARSCOV2NAA POSITIVE (A) 02/10/2021   SARSCOV2NAA NEGATIVE 01/30/2021     ABG:    Component Value Date/Time   HCO3 23.3 02/15/2021 1354   TCO2 24 02/15/2021 1354   ACIDBASEDEF 1.0 02/15/2021 1354   O2SAT 64.0 02/15/2021 1354    Vent Settings: N/A  Condition - Stable  Family Communication  : Son-[name] updated over the phone on 5/17  Code Status :  Full Code  Diet :  Diet Order            Diet - low sodium heart healthy           Diet 2 gram sodium Room service appropriate? Yes; Fluid consistency: Thin; Fluid restriction: 1200 mL Fluid  Diet effective now                  Disposition Plan  :   Status is: Inpatient  Remains inpatient appropriate because:Inpatient level of care appropriate due to severity of illness   Dispo:  Patient From: Home  Planned Disposition: Home  Medically stable for discharge: Yes      Barriers to discharge: Awaiting SNF bed-however needs to complete 10 days of isolation before  discharge.  Antimicorbials  :    Anti-infectives (From admission, onward)   Start     Dose/Rate Route Frequency Ordered Stop   02/11/21 1000  remdesivir 100 mg in sodium chloride 0.9 % 100 mL IVPB       "Followed by" Linked Group Details   100 mg 200 mL/hr over 30 Minutes Intravenous Daily 02/10/21 1410 02/12/21 0951   02/10/21 1515  remdesivir 200 mg in sodium chloride 0.9% 250 mL IVPB       "Followed by" Linked Group Details   200 mg 580 mL/hr over 30 Minutes Intravenous Once 02/10/21 1410 02/10/21 1733      Inpatient Medications  Scheduled Meds: . aspirin EC  81 mg Oral Daily  . atorvastatin  80 mg Oral Daily  . Chlorhexidine Gluconate Cloth  6 each Topical Daily  . digoxin  0.125 mg Oral Daily  . enoxaparin (LOVENOX) injection  20 mg Subcutaneous Q24H  . midodrine  10 mg Oral TID WC  . sodium chloride flush  10-40 mL Intracatheter Q12H  . ticagrelor  90 mg Oral BID   Continuous Infusions:  PRN Meds:.acetaminophen, melatonin, nitroGLYCERIN, prochlorperazine, sodium chloride flush   Time Spent in minutes  25    See all Orders from today for further details   Oren Binet M.D on 02/17/2021 at 1:50 PM  To page go to [email or web address] - use universal password  [facility] -  Office  [phone]    Objective:   Vitals:   02/17/21 0400 02/17/21 0600 02/17/21 0700 02/17/21 1100  BP: 95/66 96/65 112/64 99/66  Pulse: 81 76 80 85  Resp: 20 20 (!) 21 20  Temp: 98.1 F (36.7 C)     TempSrc: Oral     SpO2: 99% 99% 98% 95%  Weight: 40.9 kg       Wt Readings from Last 3 Encounters:  02/17/21 40.9 kg  02/04/21  43.4 kg  11/25/11 56.6 kg     Intake/Output Summary (Last 24 hours) at 02/17/2021 1350 Last data filed at 02/17/2021 0000 Gross per 24 hour  Intake 240 ml  Output 200 ml  Net 40 ml     Physical Exam Gen Exam:Alert awake-not in any distress HEENT:atraumatic, normocephalic Chest: B/L clear to auscultation anteriorly CVS:S1S2  regular Abdomen:soft non tender, non distended Extremities:no edema Neurology: Non focal Skin: no rash   Data Review:    CBC Recent Labs  Lab 02/11/21 0914 02/15/21 1353 02/15/21 1354  WBC 7.4  --   --   HGB 12.4 11.6* 11.6*  HCT 35.0* 34.0* 34.0*  PLT 279  --   --   MCV 91.1  --   --   MCH 32.3  --   --   MCHC 35.4  --   --   RDW 12.8  --   --   LYMPHSABS 0.9  --   --   MONOABS 0.7  --   --   EOSABS 0.0  --   --   BASOSABS 0.0  --   --     Chemistries  Recent Labs  Lab 02/11/21 0914 02/12/21 0544 02/12/21 2043 02/13/21 0319 02/14/21 0550 02/15/21 0650 02/15/21 1353 02/15/21 1354 02/16/21 0352 02/17/21 0703  NA 125* 123*   < > 125* 126* 128* 130* 129* 129* 131*  K 4.7 4.6   < > 3.7 3.7 3.5 3.3* 3.4* 3.3* 4.2  CL 98 93*   < > 96* 97* 98  --   --  98 97*  CO2 21* 22   < > 21* 21* 22  --   --  22 27  GLUCOSE 92 93   < > 97 96 95  --   --  84 97  BUN <5* 8   < > 10 8 7*  --   --  7* 9  CREATININE 0.78 0.76   < > 0.69 0.66 0.68  --   --  0.74 0.78  CALCIUM 8.4* 8.5*   < > 7.8* 8.3* 8.4*  --   --  8.4* 8.6*  MG  --  1.9  --   --   --   --   --   --   --   --   AST 27 23  --  23 25 29   --   --   --   --   ALT 24 23  --  19 20 22   --   --   --   --   ALKPHOS 81 77  --  66 78 84  --   --   --   --   BILITOT 0.4 0.6  --  0.7 0.8 0.9  --   --   --   --    < > = values in this interval not displayed.   ------------------------------------------------------------------------------------------------------------------ No results for input(s): CHOL, HDL, LDLCALC, TRIG, CHOLHDL, LDLDIRECT in the last 72 hours.  Lab Results  Component Value Date   HGBA1C 5.4 01/31/2021   ------------------------------------------------------------------------------------------------------------------ No results for input(s): TSH, T4TOTAL, T3FREE, THYROIDAB in the last 72 hours.  Invalid input(s):  FREET3 ------------------------------------------------------------------------------------------------------------------ No results for input(s): VITAMINB12, FOLATE, FERRITIN, TIBC, IRON, RETICCTPCT in the last 72 hours.  Coagulation profile No results for input(s): INR, PROTIME in the last 168 hours.  No results for input(s): DDIMER in the last 72 hours.  Cardiac Enzymes No results for input(s): CKMB, TROPONINI, MYOGLOBIN in the  last 168 hours.  Invalid input(s): CK ------------------------------------------------------------------------------------------------------------------    Component Value Date/Time   BNP 792.9 (H) 02/01/2021 1100    Micro Results Recent Results (from the past 240 hour(s))  SARS CORONAVIRUS 2 (TAT 6-24 HRS) Nasopharyngeal Nasopharyngeal Swab     Status: Abnormal   Collection Time: 02/10/21 12:18 AM   Specimen: Nasopharyngeal Swab  Result Value Ref Range Status   SARS Coronavirus 2 POSITIVE (A) NEGATIVE Final    Comment: (NOTE) SARS-CoV-2 target nucleic acids are DETECTED.  The SARS-CoV-2 RNA is generally detectable in upper and lower respiratory specimens during the acute phase of infection. Positive results are indicative of the presence of SARS-CoV-2 RNA. Clinical correlation with patient history and other diagnostic information is  necessary to determine patient infection status. Positive results do not rule out bacterial infection or co-infection with other viruses.  The expected result is Negative.  Fact Sheet for Patients: SugarRoll.be  Fact Sheet for Healthcare Providers: https://www.woods-mathews.com/  This test is not yet approved or cleared by the [place] FDA and  has been authorized for detection and/or diagnosis of SARS-CoV-2 by FDA under an Emergency Use Authorization (EUA). This EUA will remain  in effect (meaning this test can be used) for the duration of the COVID-19 declaration  under Section 564(b)(1) of the Act, 21 U. S.C. section 360bbb-3(b)(1), unless the authorization is terminated or revoked sooner.   Performed at [facility], [place] [address] [place], [place] [address]     Radiology Reports DG Chest 2 View  Result Date: 02/01/2021 CLINICAL DATA:  History of lung cancer EXAM: CHEST - 2 VIEW COMPARISON:  Radiograph 11/25/2011 FINDINGS: Right IJ central venous catheter tip terminates at the right atrium. Telemetry leads overlie the chest. Chronically coarsened interstitial changes and bronchitic features. Bandlike opacity is seen in the left lung apex with some tenting of the left hemidiaphragm, could reflect subsegmental atelectasis or volume loss though underlying airspace disease or architectural distortion could have a similar appearance. No focal consolidative opacity is seen. The cardiomediastinal contours are unremarkable. Few coronary stents are noted. Age-indeterminate left sixth and seventh posterolateral left rib fractures. No other acute osseous or soft tissue abnormalities the chest wall. IMPRESSION: Chronically coarsened interstitial and bronchitic features. New bandlike opacity in left lung apex with tenting of the left hemidiaphragm. Could reflect scarring or atelectatic change with volume loss. Underlying airspace disease or mass is less favored though not fully excluded. Right IJ approach central venous catheter at the right atrium Posterolateral left sixth and seventh rib fractures, age indeterminate, correlate for point tenderness. Electronically Signed   By: Lovena Le M.D.   On: 02/01/2021 04:25   CARDIAC CATHETERIZATION  Result Date: 02/15/2021 Findings: RA = 2 RV = 17/2 PA = 17/4 (8) PCW = 4 Fick cardiac output/index = 3.0/2.4 Thermo CO/CI = 2.8/2.2 PVR = 1.3 Ao sat = 98% PA sat = 64%, 65% Assessment: 1. Low filling pressures with moderately reduced cardiac output Plan/Discussion: Hydrate gently. No role for inotropic support at this time.  Glori Bickers, MD 2:24 PM   CARDIAC CATHETERIZATION  Result Date: 01/30/2021  The left ventricular ejection fraction is 45-50% by visual estimate. Mid to apical anterior hypokinesis.  LV end diastolic pressure is mildly elevated at 13 mmHg, with systolic 98 mmHg.  There is trivial (1+) mitral regurgitation.  --------------------------------------------  Culprit lesion: Prox LAD lesion is 99% stenosed. (Calcified, thrombotic)  A drug-eluting stent was successfully placed using a SYNERGY XD 2.50X16 -> postdilated 2.8  mm  Post intervention, there is a 0% residual stenosis.  Dist LAD lesion is 80% stenosed -> there is TIMI 0 flow to the apex, post PCI TIMI II flow, but still notable irregularities distally, likely distal embolism.  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  Distal LAD has TIMI I 0 flow.  Successful DES PCI of the LAD using a synergy DES 2.5 mm x 16 mm postdilated to 2.8 mm.  Otherwise angiographically normal coronary arteries with exception of the apical LAD.  Mild mid to apical anterior hypokinesis on LV gram with mildly reduced EF of roughly 45%.  Upper limit of normal LVEDP.  Borderline hypotension with systolic pressures ranging in the 95 to 105 mmHg range -> no evidence of shock  Severe hypOkalemia, i-STAT potassium read is 2.1, labs from ER read is 2.7.  Resolution of ventricular bigeminy. RECOMMENDATION  Admit to CVICU  Will run 3 rounds of IV potassium and recheck in 3 hours.  Continue IV Aggrastat for another 1 hour post PCI.  Check 2D echocardiogram, A1c and lipid panel the morning.  High-dose high intensity statin  Will attempt to start low-dose beta-blocker given tachycardia, however with borderline pressures, may need to hold.  Cardiac rehab consult  Smoking cessation consult Glenetta Hew, MD  DG Chest [facility] 1 View  Result Date: 02/09/2021 CLINICAL DATA:  Weakness, history of  recent STEMI EXAM: PORTABLE CHEST 1 VIEW COMPARISON:  01/31/2021 FINDINGS: Cardiac shadow is at the upper limits of normal in size. Prior right jugular central line has been removed in the interval. The lungs are well aerated without focal infiltrate or sizable effusion. Chronic band like density is noted in the left upper lobe medially consistent with prior radiation therapy. This is stable from a prior CT from 12/23/2020 IMPRESSION: No acute abnormality noted. Prior radiation change in the medial left upper lobe. Electronically Signed   By: Inez Catalina M.D.   On: 02/09/2021 23:34   ECHOCARDIOGRAM COMPLETE  Result Date: 01/31/2021    ECHOCARDIOGRAM REPORT   Patient Name:   FREDA JAQUITH Date of Exam: 01/31/2021 Medical Rec #:  [id number]         Height:       58.0 in Accession #:    [id number]        Weight:       100.1 lb Date of Birth:  06/04/1956         BSA:          1.357 m Patient Age:    59 years          BP:           133/91 mmHg Patient Gender: F                 HR:           73 bpm. Exam Location:  Inpatient Procedure: 2D Echo, Cardiac Doppler, Color Doppler and 3D Echo Indications:    Acute myocardial infarction  History:        Patient has no prior history of Echocardiogram examinations.                 Acute MI and CAD; Risk Factors:Current Smoker. GERD.  Sonographer:    Clayton Lefort RDCS (AE) Referring Phys: [place]  1. No left ventricular thrombus. Left ventricular ejection fraction, by estimation, is 25 to 30%. The left ventricle has severely decreased  function. The left ventricle demonstrates regional wall motion abnormalities (see scoring diagram/findings for description). Left ventricular diastolic parameters are consistent with Grade II diastolic dysfunction (pseudonormalization). Elevated left atrial pressure. There is severe hypokinesis of the left ventricular, entire anteroseptal wall and anterior wall. There is mild dyskinesis of the left ventricular, apical  anterior wall and apical segment.  2. Right ventricular systolic function is normal. The right ventricular size is normal. There is mildly elevated pulmonary artery systolic pressure.  3. A small pericardial effusion is present. The pericardial effusion is circumferential.  4. The mitral valve is normal in structure. No evidence of mitral valve regurgitation.  5. The aortic valve is normal in structure. Aortic valve regurgitation is mild.  6. The inferior vena cava is normal in size with <50% respiratory variability, suggesting right atrial pressure of 8 mmHg. FINDINGS  Left Ventricle: No left ventricular thrombus. Left ventricular ejection fraction, by estimation, is 25 to 30%. The left ventricle has severely decreased function. The left ventricle demonstrates regional wall motion abnormalities. Severe hypokinesis of the left ventricular, entire anteroseptal wall and anterior wall. Mild dyskinesis of the left ventricular, apical anterior wall and apical segment. 3D left ventricular ejection fraction analysis performed but not reported based on interpreter judgement due to suboptimal quality. The left ventricular internal cavity size was normal in size. There is no left ventricular hypertrophy. Left ventricular diastolic parameters are consistent with Grade II diastolic dysfunction (pseudonormalization). Elevated left atrial pressure.  LV Wall Scoring: The apical anterior segment and apex are dyskinetic. The anterior wall, entire anterior septum, apical lateral segment, mid inferoseptal segment, and apical inferior segment are hypokinetic. The antero-lateral wall, inferior wall, posterior wall, and basal inferoseptal segment are normal. Right Ventricle: The right ventricular size is normal. No increase in right ventricular wall thickness. Right ventricular systolic function is normal. There is mildly elevated pulmonary artery systolic pressure. The tricuspid regurgitant velocity is 2.63  m/s, and with an assumed  right atrial pressure of 8 mmHg, the estimated right ventricular systolic pressure is 27.0 mmHg. Left Atrium: Left atrial size was normal in size. Right Atrium: Right atrial size was normal in size. Pericardium: A small pericardial effusion is present. The pericardial effusion is circumferential. Mitral Valve: The mitral valve is normal in structure. No evidence of mitral valve regurgitation. MV peak gradient, 2.7 mmHg. The mean mitral valve gradient is 1.0 mmHg. Tricuspid Valve: The tricuspid valve is normal in structure. Tricuspid valve regurgitation is mild. Aortic Valve: The aortic valve is normal in structure. Aortic valve regurgitation is mild. Aortic valve mean gradient measures 2.0 mmHg. Aortic valve peak gradient measures 3.3 mmHg. Aortic valve area, by VTI measures 1.59 cm. Pulmonic Valve: The pulmonic valve was normal in structure. Pulmonic valve regurgitation is trivial. Aorta: The aortic root is normal in size and structure. Venous: The inferior vena cava is normal in size with less than 50% respiratory variability, suggesting right atrial pressure of 8 mmHg. IAS/Shunts: No atrial level shunt detected by color flow Doppler. Additional Comments: A venous catheter is visualized in the right atrium.  LEFT VENTRICLE PLAX 2D LVIDd:         3.80 cm  Diastology LVIDs:         2.30 cm  LV e' medial:    4.46 cm/s LV PW:         1.10 cm  LV E/e' medial:  11.5 LV IVS:        0.90 cm  LV e' lateral:   5.44  cm/s LVOT diam:     1.70 cm  LV E/e' lateral: 9.4 LV SV:         28 LV SV Index:   21 LVOT Area:     2.27 cm                          3D Volume EF:                         3D EF:        36 %                         LV EDV:       97 ml                         LV ESV:       62 ml                         LV SV:        35 ml RIGHT VENTRICLE            IVC RV Basal diam:  2.50 cm    IVC diam: 1.80 cm RV S prime:     9.79 cm/s TAPSE (M-mode): 1.2 cm LEFT ATRIUM             Index       RIGHT ATRIUM          Index LA  diam:        1.90 cm 1.40 cm/m  RA Area:     7.28 cm LA Vol (A2C):   23.0 ml 16.95 ml/m RA Volume:   13.40 ml 9.88 ml/m LA Vol (A4C):   23.2 ml 17.10 ml/m LA Biplane Vol: 24.4 ml 17.98 ml/m  AORTIC VALVE AV Area (Vmax):    1.65 cm AV Area (Vmean):   1.60 cm AV Area (VTI):     1.59 cm AV Vmax:           91.00 cm/s AV Vmean:          64.500 cm/s AV VTI:            0.176 m AV Peak Grad:      3.3 mmHg AV Mean Grad:      2.0 mmHg LVOT Vmax:         66.20 cm/s LVOT Vmean:        45.600 cm/s LVOT VTI:          0.123 m LVOT/AV VTI ratio: 0.70  AORTA Ao Root diam: 2.90 cm Ao Asc diam:  3.10 cm MITRAL VALVE               TRICUSPID VALVE MV Area (PHT): 3.77 cm    TR Peak grad:   27.7 mmHg MV Area VTI:   1.81 cm    TR Vmax:        263.00 cm/s MV Peak grad:  2.7 mmHg MV Mean grad:  1.0 mmHg    SHUNTS MV Vmax:       0.81 m/s    Systemic VTI:  0.12 m MV Vmean:      49.0 cm/s   Systemic Diam: 1.70 cm MV Decel Time: 201 msec MV E velocity: 51.40 cm/s MV A velocity: 41.60 cm/s MV E/A ratio:  1.24 Mihai  Croitoru MD Electronically signed by Sanda Klein MD Signature Date/Time: 01/31/2021/12:09:51 PM    Final    ECHOCARDIOGRAM LIMITED  Result Date: 02/13/2021    ECHOCARDIOGRAM LIMITED REPORT   Patient Name:   HAYLI MILLIGAN Date of Exam: 02/13/2021 Medical Rec #:  [id number]     Height:       57.0 in Accession #:    [id number]    Weight:       95.2 lb Date of Birth:  1956/04/23     BSA:          1.312 m Patient Age:    67 years      BP:           114/72 mmHg Patient Gender: F             HR:           75 bpm. Exam Location:  Inpatient Procedure: 2D Echo, Cardiac Doppler and Color Doppler Indications:    CHF-Acute Systolic  History:        Patient has prior history of Echocardiogram examinations, most                 recent 02/01/2021. Previous Myocardial Infarction and CAD; Risk                 Factors:Dyslipidemia and Current Smoker. GERD.  Sonographer:    Clayton Lefort RDCS (AE) Referring Phys: [place]  1. Akinesis of the anteroseptal, apical and distal inferior walls with overall severe LV dysfunction; large, predominantly anterior pericardial effusion; respiratory flow variation across mitral valve and RA collapse suggestive of early tamponade physiology but no RV diastolic collapse and IVC not dilated and collapses. Compared to 02/01/21, pericardial effusion is larger.  2. Left ventricular ejection fraction, by estimation, is 20 to 25%. The left ventricle has severely decreased function. The left ventricle demonstrates regional wall motion abnormalities (see scoring diagram/findings for description). There is moderate left ventricular hypertrophy of the basal-septal segment.  3. Right ventricular systolic function is normal. The right ventricular size is normal.  4. Large pericardial effusion.  5. The mitral valve is normal in structure. No evidence of mitral valve regurgitation. No evidence of mitral stenosis.  6. The aortic valve is tricuspid. Aortic valve regurgitation is not visualized. No aortic stenosis is present.  7. The inferior vena cava is normal in size with greater than 50% respiratory variability, suggesting right atrial pressure of 3 mmHg. FINDINGS  Left Ventricle: Left ventricular ejection fraction, by estimation, is 20 to 25%. The left ventricle has severely decreased function. The left ventricle demonstrates regional wall motion abnormalities. The left ventricular internal cavity size was normal  in size. There is moderate left ventricular hypertrophy of the basal-septal segment. Right Ventricle: The right ventricular size is normal.Right ventricular systolic function is normal. Left Atrium: Left atrial size was normal in size. Right Atrium: Right atrial size was normal in size. Pericardium: A large pericardial effusion is present. Mitral Valve: The mitral valve is normal in structure. No evidence of mitral valve stenosis. Tricuspid Valve: The tricuspid valve is normal in structure.  Tricuspid valve regurgitation is trivial. No evidence of tricuspid stenosis. Aortic Valve: The aortic valve is tricuspid. Aortic valve regurgitation is not visualized. No aortic stenosis is present. Pulmonic Valve: The pulmonic valve was not well visualized. No evidence of pulmonic stenosis. Aorta: The aortic root is normal in size and structure. Venous: The inferior vena cava is normal in size  with greater than 50% respiratory variability, suggesting right atrial pressure of 3 mmHg.  Additional Comments: Akinesis of the anteroseptal, apical and distal inferior walls with overall severe LV dysfunction; large, predominantly anterior pericardial effusion; respiratory flow variation across mitral valve and RA collapse suggestive of early  tamponade physiology but no RV diastolic collapse and IVC not dilated and collapses. Compared to 02/01/21, pericardial effusion is larger. LEFT VENTRICLE PLAX 2D LVIDd:         3.50 cm LVIDs:         2.70 cm LV PW:         1.10 cm LV IVS:        1.40 cm LVOT diam:     1.80 cm LVOT Area:     2.54 cm  LV Volumes (MOD) LV vol d, MOD A2C: 54.7 ml LV vol d, MOD A4C: 54.4 ml LV vol s, MOD A2C: 38.2 ml LV vol s, MOD A4C: 42.6 ml LV SV MOD A2C:     16.5 ml LV SV MOD A4C:     54.4 ml LV SV MOD BP:      15.4 ml IVC IVC diam: 1.50 cm LEFT ATRIUM         Index LA diam:    3.00 cm 2.29 cm/m   AORTA Ao Root diam: 3.10 cm Ao Asc diam:  2.80 cm  SHUNTS Systemic Diam: 1.80 cm Kirk Ruths MD Electronically signed by Kirk Ruths MD Signature Date/Time: 02/13/2021/3:22:45 PM    Final    ECHOCARDIOGRAM LIMITED  Result Date: 02/01/2021    ECHOCARDIOGRAM LIMITED REPORT   Patient Name:   CHARLISHA MARKET Date of Exam: 02/01/2021 Medical Rec #:  [id number]     Height:       57.0 in Accession #:    [id number]    Weight:       97.0 lb Date of Birth:  1956/03/23     BSA:          1.322 m Patient Age:    30 years      BP:           94/64 mmHg Patient Gender: F             HR:           92 bpm. Exam Location:   Inpatient Procedure: Limited Color Doppler, Cardiac Doppler, Color Doppler and Limited            Echo STAT ECHO Indications:    Pericardial effusion with hypotension. Assess for tamponade  History:        Patient has prior history of Echocardiogram examinations, most                 recent 01/31/2021. CAD and Previous Myocardial Infarction,                 Abnormal ECG and Left heart cath; Signs/Symptoms:Hypotension.                 Frequent falls with broken ribs. Lung cancer.  Sonographer:    Merrie Roof Referring Phys: [place] Comments: This was a limited echo to rule out tamponade. IMPRESSIONS  1. Left ventricular ejection fraction, by estimation, is 25%. The left ventricle has severely decreased function. The left ventricle demonstrates regional wall motion abnormalities with mid to apical anteroseptal/inferoseptal/anterior akinesis, apical lateral akinesis, and akinesis of the true apex. Consistent with large LAD territory MI. There is mild left ventricular hypertrophy.  2. Right ventricular systolic function is normal. The right ventricular size is normal.  3. Small to moderate pericardial effusion primarily adjacent to the RV. The IVC collapses around 50% with respiration. Difficult to interpret respirometry due to frequent ectopy. RV diastolic collapse is not present. No overt pericardial tamponade. Pericardial effusion is similar to the prior study.  4. The inferior vena cava is normal in size with <50% respiratory variability, suggesting right atrial pressure of 8 mmHg.  5. Limited echo FINDINGS  Left Ventricle: Left ventricular ejection fraction, by estimation, is 25%. The left ventricle has severely decreased function. The left ventricle demonstrates regional wall motion abnormalities. The left ventricular internal cavity size was normal in size. There is mild left ventricular hypertrophy. Right Ventricle: The right ventricular size is normal. No increase in right ventricular wall  thickness. Right ventricular systolic function is normal. Left Atrium: Left atrial size was normal in size. Right Atrium: Right atrial size was normal in size. Pericardium: Small to moderate pericardial effusion primarily adjacent to the RV. The IVC collapses around 50% with respiration. Difficult to interpret respirometry due to frequent ectopy. RV diastolic collapse is not present. No overt pericardial tamponade. Venous: The inferior vena cava is normal in size with less than 50% respiratory variability, suggesting right atrial pressure of 8 mmHg. IAS/Shunts: No atrial level shunt detected by color flow Doppler. IVC IVC diam: 2.00 cm Loralie Champagne MD Electronically signed by Loralie Champagne MD Signature Date/Time: 02/01/2021/4:32:21 PM    Final    [place] EKG SITE RITE  Result Date: 02/12/2021 If Site Rite image not attached, placement could not be confirmed due to current cardiac rhythm.  [place] EKG SITE RITE  Result Date: 02/12/2021 If Site Rite image not attached, placement could not be confirmed due to current cardiac rhythm.

## 2021-02-18 ENCOUNTER — Encounter (HOSPITAL_COMMUNITY): Payer: Medicaid Other

## 2021-02-18 DIAGNOSIS — I5022 Chronic systolic (congestive) heart failure: Secondary | ICD-10-CM | POA: Diagnosis not present

## 2021-02-18 MED ORDER — HYDRALAZINE HCL 20 MG/ML IJ SOLN: 10.0000 mg | INTRAMUSCULAR | Status: DC | PRN

## 2021-02-18 MED ORDER — SODIUM CHLORIDE 0.9 % IV SOLN: INTRAVENOUS | Status: DC

## 2021-02-18 MED ORDER — SODIUM CHLORIDE 0.9 % IV SOLN: 250.0000 mL | INTRAVENOUS | Status: DC | PRN

## 2021-02-18 MED ORDER — ENOXAPARIN SODIUM 40 MG/0.4ML IJ SOSY: 40.0000 mg | PREFILLED_SYRINGE | INTRAMUSCULAR | Status: DC

## 2021-02-18 MED ORDER — SODIUM CHLORIDE 0.9% FLUSH: 3.0000 mL | Freq: Two times a day (BID) | INTRAVENOUS | Status: DC

## 2021-02-18 MED ORDER — SODIUM CHLORIDE 0.9% FLUSH: 3.0000 mL | INTRAVENOUS | Status: DC | PRN

## 2021-02-18 MED ORDER — ACETAMINOPHEN 325 MG PO TABS: 650.0000 mg | ORAL_TABLET | ORAL | Status: DC | PRN

## 2021-02-18 MED ORDER — LABETALOL HCL 5 MG/ML IV SOLN: 10.0000 mg | INTRAVENOUS | Status: DC | PRN

## 2021-02-18 MED ORDER — ONDANSETRON HCL 4 MG/2ML IJ SOLN: 4.0000 mg | Freq: Four times a day (QID) | INTRAMUSCULAR | Status: DC | PRN

## 2021-02-18 NOTE — Progress Notes (Signed)
   02/18/21 0100  Assess: MEWS Score  Temp 98.2 F (36.8 C)  BP 112/69  Pulse Rate (!) 103  ECG Heart Rate (!) 101  Resp (!) 23  SpO2 97 %  O2 Device Room Air  Assess: MEWS Score  MEWS Temp 0  MEWS Systolic 0  MEWS Pulse 1  MEWS RR 1  MEWS LOC 0  MEWS Score 2  MEWS Score Color Yellow  Treat  Pain Scale 0-10  Pain Score 0  Notify: Rapid Response  Name of Rapid Response RN Notified [place] RN  Date Rapid Response Notified 02/18/21  Time Rapid Response Notified 0115  Document  Patient Outcome Not stable and remains on department  Progress note created (see row info) Yes  Assess: SIRS CRITERIA  SIRS Temperature  0  SIRS Pulse 1  SIRS Respirations  1  SIRS WBC 0  SIRS Score Sum  2

## 2021-02-18 NOTE — TOC Progression Note (Addendum)
Transition of Care (TOC) - Progression Note  Heart Failure   Patient Details  Name: Colleen Fleming MRN: [id number] Date of Birth: 06/25/1956  Transition of Care [facility]) CM/SW [place], [place] Phone Number: 02/18/2021, 2:35 PM  Clinical Narrative:    CSW spoke with the patients son [name] [phone] over the phone regarding the bed offers and [name] indicated he would like his mom to go to [place] for rehab. CSW attempted to outreach [place] with [facility] at [phone] however they didn't answer and CSW left a voicemail for them to return the call.   [facility] [place] messaged CSW to inform that they will start the insurance authorization.  TOC will continue to follow for discharge needs.  Expected Discharge Plan: [place] Barriers to Discharge: SNF Covid,SNF Covid Recovering  Expected Discharge Plan and Services Expected Discharge Plan: [place] In-house Referral: Clinical Social Work Discharge Planning Services: CM Consult Post Acute Care Choice: [place] arrangements for the past 2 months: [place] Expected Discharge Date: 02/16/21               DME Arranged: N/A DME Agency: NA                   Social Determinants of Health ([place]) Interventions    Readmission Risk Interventions Readmission Risk Prevention Plan 02/04/2021  Post Dischage Appt Complete  Transportation Screening Complete  Some recent data might be hidden   Verlan Grotz, MSW, LCSWA [phone] Heart Failure Social Worker

## 2021-02-18 NOTE — Progress Notes (Signed)
PROGRESS NOTE                                                                                                                                                                                                             Patient Demographics:    Colleen Fleming, is a 65 y.o. female, DOB - [birth year], LKJ:[id number]  Outpatient Primary MD for the patient is Belva Bertin, [place], [place]   Admit date - 02/09/2021   LOS - 8  Chief Complaint  Patient presents with  . Weakness       Brief Narrative: Patient is a 65 y.o. female with PMHx of CAD-recent PCI, chronic systolic heart failure-presented with diarrhea-found to have severe hyponatremia.  Found to have COVID-19 infection.  COVID-19 vaccinated status:  Significant Events: 5/10>> Admit to [facility] for diarrhea-vit D-found to have hyponatremia-sodium 117. 5/16>> underwent RHC-low filling pressures, moderately decreased cardiac output. 5/17>> plan to discharge-however worsening weakness-PT now recommending SNF.  Discharge held.  Significant studies: 5/10>>Chest x-ray: No pneumonia  COVID-19 medications: Remdesivir:5/11>> 5/13  Antibiotics: None  Microbiology data: None  Procedures: 5/16>> RHC: RA = 2 RV = 17/2 PA = 17/4 (8) PCW = 4 Fick cardiac output/index = 3.0/2.4 Thermo CO/CI = 2.8/2.2 PVR = 1.3 Ao sat = 98% PA sat = 64%, 65  Consults: Advanced heart failure team.  DVT prophylaxis: Place TED hose Start: 02/17/21 1720 Place TED hose Start: 02/12/21 1320 Place and maintain sequential compression device Start: 02/10/21 0202    Subjective:   Orthostatic with physical therapy yesterday.  Per nursing staff-had 1 episode of nausea and loose stools overnight.  Does not eat food but mostly drinks Ensure.   Assessment  & Plan :   Hyponatremia: Felt to be due to hypovolemia in the setting of COVID-19 related gastroenteritis/diarrhea.  She initially was treated  with IV fluids-she is now volume replete.  Sodium level slowly improving with just fluid restriction.  Persistent mild hyponatremia now is felt to be from underlying CHF.  Hypokalemia: Due to GI losses-resolved.  Diarrhea: Likely from COVID 19 infection-resolved.  Orthostatic hypotension: Placed TED stockings-not on diuretics or antihypertensives.  COVID-19 infection: Likely causing diarrhea-on Remdesivir x3 days.  Needs a total of 10 days of isolation from 5/11.  Hypotension: Due to combination of hypovolemia and underlying chronic systolic heart failure.  BP stabilizing on midodrine.  Recent STEMI-s/p PCI: No anginal symptoms-on antiplatelets/statin  Chronic systolic heart failure (EF 25% on echo 5/2): Volume status stable-see above.  No beta-blocker or spironolactone due to low BP.  Tolerating digoxin.  Will need outpatient follow-up with CHF team for addition of other medications.  Weakness/debility: Due to acute illness-COVID-19 infection-has a very poor appetite-only drinking Ensure and fluids per nursing staff-we will liberalize diet for a few days-changed to regular diet.  COVID-19 Labs: No results for input(s): DDIMER, FERRITIN, LDH, CRP in the last 72 hours.     Component Value Date/Time   BNP 792.9 (H) 02/01/2021 1100    No results for input(s): PROCALCITON in the last 168 hours.  Lab Results  Component Value Date   SARSCOV2NAA POSITIVE (A) 02/10/2021   SARSCOV2NAA NEGATIVE 01/30/2021     ABG:    Component Value Date/Time   HCO3 23.3 02/15/2021 1354   TCO2 24 02/15/2021 1354   ACIDBASEDEF 1.0 02/15/2021 1354   O2SAT 64.0 02/15/2021 1354    Vent Settings: N/A  Condition - Stable  Family Communication  : Son-[name]-[phone] updated over the phone on 5/17  Code Status :  Full Code  Diet :  Diet Order            Diet - low sodium heart healthy                  Disposition Plan  :   Status is: Inpatient  Remains inpatient appropriate  because:Inpatient level of care appropriate due to severity of illness   Dispo:  Patient From: Home  Planned Disposition: Home  Medically stable for discharge: Yes      Barriers to discharge: Awaiting SNF bed-however needs to complete 10 days of isolation before discharge.  Antimicorbials  :    Anti-infectives (From admission, onward)   Start     Dose/Rate Route Frequency Ordered Stop   02/11/21 1000  remdesivir 100 mg in sodium chloride 0.9 % 100 mL IVPB       "Followed by" Linked Group Details   100 mg 200 mL/hr over 30 Minutes Intravenous Daily 02/10/21 1410 02/12/21 0951   02/10/21 1515  remdesivir 200 mg in sodium chloride 0.9% 250 mL IVPB       "Followed by" Linked Group Details   200 mg 580 mL/hr over 30 Minutes Intravenous Once 02/10/21 1410 02/10/21 1733      Inpatient Medications  Scheduled Meds: . aspirin EC  81 mg Oral Daily  . atorvastatin  80 mg Oral Daily  . Chlorhexidine Gluconate Cloth  6 each Topical Daily  . digoxin  0.125 mg Oral Daily  . enoxaparin (LOVENOX) injection  20 mg Subcutaneous Q24H  . midodrine  10 mg Oral TID WC  . sodium chloride flush  10-40 mL Intracatheter Q12H  . ticagrelor  90 mg Oral BID   Continuous Infusions:  PRN Meds:.acetaminophen, melatonin, nitroGLYCERIN, prochlorperazine, sodium chloride flush   Time Spent in minutes  25    See all Orders from today for further details   Oren Binet M.D on 02/18/2021 at 12:15 PM  To page go to [email or web address] - use universal password  [facility] -  Office  [phone]    Objective:   Vitals:   02/18/21 0100 02/18/21 0308 02/18/21 0800 02/18/21 0822  BP: 112/69 112/74    Pulse: (!) 103 (!) 101    Resp: (!) 23 17 20    Temp: 98.2 F (36.8 C) 98.1 F (36.7 C)  97.8 F (36.6 C)  TempSrc: Oral Oral  Oral  SpO2: 97% 99%    Weight:  40.5 kg      Wt Readings from Last 3 Encounters:  02/18/21 40.5 kg  02/04/21 43.4 kg  11/25/11 56.6 kg     Intake/Output  Summary (Last 24 hours) at 02/18/2021 1215 Last data filed at 02/17/2021 2035 Gross per 24 hour  Intake 240 ml  Output --  Net 240 ml     Physical Exam Gen Exam:Alert awake-not in any distress HEENT:atraumatic, normocephalic Chest: B/L clear to auscultation anteriorly CVS:S1S2 regular Abdomen:soft non tender, non distended Extremities:no edema Neurology: Non focal Skin: no rash   Data Review:    CBC Recent Labs  Lab 02/15/21 1353 02/15/21 1354  HGB 11.6* 11.6*  HCT 34.0* 34.0*    Chemistries  Recent Labs  Lab 02/12/21 0544 02/12/21 2043 02/13/21 0319 02/14/21 0550 02/15/21 0650 02/15/21 1353 02/15/21 1354 02/16/21 0352 02/17/21 0703  NA 123*   < > 125* 126* 128* 130* 129* 129* 131*  K 4.6   < > 3.7 3.7 3.5 3.3* 3.4* 3.3* 4.2  CL 93*   < > 96* 97* 98  --   --  98 97*  CO2 22   < > 21* 21* 22  --   --  22 27  GLUCOSE 93   < > 97 96 95  --   --  84 97  BUN 8   < > 10 8 7*  --   --  7* 9  CREATININE 0.76   < > 0.69 0.66 0.68  --   --  0.74 0.78  CALCIUM 8.5*   < > 7.8* 8.3* 8.4*  --   --  8.4* 8.6*  MG 1.9  --   --   --   --   --   --   --   --   AST 23  --  23 25 29   --   --   --   --   ALT 23  --  19 20 22   --   --   --   --   ALKPHOS 77  --  66 78 84  --   --   --   --   BILITOT 0.6  --  0.7 0.8 0.9  --   --   --   --    < > = values in this interval not displayed.   ------------------------------------------------------------------------------------------------------------------ No results for input(s): CHOL, HDL, LDLCALC, TRIG, CHOLHDL, LDLDIRECT in the last 72 hours.  Lab Results  Component Value Date   HGBA1C 5.4 01/31/2021   ------------------------------------------------------------------------------------------------------------------ No results for input(s): TSH, T4TOTAL, T3FREE, THYROIDAB in the last 72 hours.  Invalid input(s):  FREET3 ------------------------------------------------------------------------------------------------------------------ No results for input(s): VITAMINB12, FOLATE, FERRITIN, TIBC, IRON, RETICCTPCT in the last 72 hours.  Coagulation profile No results for input(s): INR, PROTIME in the last 168 hours.  No results for input(s): DDIMER in the last 72 hours.  Cardiac Enzymes No results for input(s): CKMB, TROPONINI, MYOGLOBIN in the last 168 hours.  Invalid input(s): CK ------------------------------------------------------------------------------------------------------------------    Component Value Date/Time   BNP 792.9 (H) 02/01/2021 1100    Micro Results Recent Results (from the past 240 hour(s))  SARS CORONAVIRUS 2 (TAT 6-24 HRS) Nasopharyngeal Nasopharyngeal Swab     Status: Abnormal   Collection Time: 02/10/21 12:18 AM   Specimen: Nasopharyngeal Swab  Result Value Ref Range Status   SARS Coronavirus 2 POSITIVE (A) NEGATIVE Final  Comment: (NOTE) SARS-CoV-2 target nucleic acids are DETECTED.  The SARS-CoV-2 RNA is generally detectable in upper and lower respiratory specimens during the acute phase of infection. Positive results are indicative of the presence of SARS-CoV-2 RNA. Clinical correlation with patient history and other diagnostic information is  necessary to determine patient infection status. Positive results do not rule out bacterial infection or co-infection with other viruses.  The expected result is Negative.  Fact Sheet for Patients: SugarRoll.be  Fact Sheet for Healthcare Providers: https://www.woods-mathews.com/  This test is not yet approved or cleared by the [place] FDA and  has been authorized for detection and/or diagnosis of SARS-CoV-2 by FDA under an Emergency Use Authorization (EUA). This EUA will remain  in effect (meaning this test can be used) for the duration of the COVID-19 declaration  under Section 564(b)(1) of the Act, 21 U. S.C. section 360bbb-3(b)(1), unless the authorization is terminated or revoked sooner.   Performed at [facility], [place] [address] [place], [place] [address]     Radiology Reports DG Chest 2 View  Result Date: 02/01/2021 CLINICAL DATA:  History of lung cancer EXAM: CHEST - 2 VIEW COMPARISON:  Radiograph 11/25/2011 FINDINGS: Right IJ central venous catheter tip terminates at the right atrium. Telemetry leads overlie the chest. Chronically coarsened interstitial changes and bronchitic features. Bandlike opacity is seen in the left lung apex with some tenting of the left hemidiaphragm, could reflect subsegmental atelectasis or volume loss though underlying airspace disease or architectural distortion could have a similar appearance. No focal consolidative opacity is seen. The cardiomediastinal contours are unremarkable. Few coronary stents are noted. Age-indeterminate left sixth and seventh posterolateral left rib fractures. No other acute osseous or soft tissue abnormalities the chest wall. IMPRESSION: Chronically coarsened interstitial and bronchitic features. New bandlike opacity in left lung apex with tenting of the left hemidiaphragm. Could reflect scarring or atelectatic change with volume loss. Underlying airspace disease or mass is less favored though not fully excluded. Right IJ approach central venous catheter at the right atrium Posterolateral left sixth and seventh rib fractures, age indeterminate, correlate for point tenderness. Electronically Signed   By: Lovena Le M.D.   On: 02/01/2021 04:25   CARDIAC CATHETERIZATION  Result Date: 02/15/2021 Findings: RA = 2 RV = 17/2 PA = 17/4 (8) PCW = 4 Fick cardiac output/index = 3.0/2.4 Thermo CO/CI = 2.8/2.2 PVR = 1.3 Ao sat = 98% PA sat = 64%, 65% Assessment: 1. Low filling pressures with moderately reduced cardiac output Plan/Discussion: Hydrate gently. No role for inotropic support at this time.  Glori Bickers, MD 2:24 PM   CARDIAC CATHETERIZATION  Result Date: 01/30/2021  The left ventricular ejection fraction is 45-50% by visual estimate. Mid to apical anterior hypokinesis.  LV end diastolic pressure is mildly elevated at 13 mmHg, with systolic 98 mmHg.  There is trivial (1+) mitral regurgitation.  --------------------------------------------  Culprit lesion: Prox LAD lesion is 99% stenosed. (Calcified, thrombotic)  A drug-eluting stent was successfully placed using a SYNERGY XD 2.50X16 -> postdilated 2.8 mm  Post intervention, there is a 0% residual stenosis.  Dist LAD lesion is 80% stenosed -> there is TIMI 0 flow to the apex, post PCI TIMI II flow, but still notable irregularities distally, likely distal embolism.  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  Distal LAD has TIMI I 0 flow.  Successful DES PCI of the LAD using a synergy DES 2.5 mm x  16 mm postdilated to 2.8 mm.  Otherwise angiographically normal coronary arteries with exception of the apical LAD.  Mild mid to apical anterior hypokinesis on LV gram with mildly reduced EF of roughly 45%.  Upper limit of normal LVEDP.  Borderline hypotension with systolic pressures ranging in the 95 to 105 mmHg range -> no evidence of shock  Severe hypOkalemia, i-STAT potassium read is 2.1, labs from ER read is 2.7.  Resolution of ventricular bigeminy. RECOMMENDATION  Admit to CVICU  Will run 3 rounds of IV potassium and recheck in 3 hours.  Continue IV Aggrastat for another 1 hour post PCI.  Check 2D echocardiogram, A1c and lipid panel the morning.  High-dose high intensity statin  Will attempt to start low-dose beta-blocker given tachycardia, however with borderline pressures, may need to hold.  Cardiac rehab consult  Smoking cessation consult Glenetta Hew, MD  DG Chest [facility] 1 View  Result Date: 02/09/2021 CLINICAL DATA:  Weakness, history of  recent STEMI EXAM: PORTABLE CHEST 1 VIEW COMPARISON:  01/31/2021 FINDINGS: Cardiac shadow is at the upper limits of normal in size. Prior right jugular central line has been removed in the interval. The lungs are well aerated without focal infiltrate or sizable effusion. Chronic band like density is noted in the left upper lobe medially consistent with prior radiation therapy. This is stable from a prior CT from 12/23/2020 IMPRESSION: No acute abnormality noted. Prior radiation change in the medial left upper lobe. Electronically Signed   By: Inez Catalina M.D.   On: 02/09/2021 23:34   ECHOCARDIOGRAM COMPLETE  Result Date: 01/31/2021    ECHOCARDIOGRAM REPORT   Patient Name:   Colleen Fleming Date of Exam: 01/31/2021 Medical Rec #:  [id number]         Height:       58.0 in Accession #:    [id number]        Weight:       100.1 lb Date of Birth:  Mar 04, 1956         BSA:          1.357 m Patient Age:    31 years          BP:           133/91 mmHg Patient Gender: F                 HR:           73 bpm. Exam Location:  Inpatient Procedure: 2D Echo, Cardiac Doppler, Color Doppler and 3D Echo Indications:    Acute myocardial infarction  History:        Patient has no prior history of Echocardiogram examinations.                 Acute MI and CAD; Risk Factors:Current Smoker. GERD.  Sonographer:    Clayton Lefort RDCS (AE) Referring Phys: [place]  1. No left ventricular thrombus. Left ventricular ejection fraction, by estimation, is 25 to 30%. The left ventricle has severely decreased function. The left ventricle demonstrates regional wall motion abnormalities (see scoring diagram/findings for description). Left ventricular diastolic parameters are consistent with Grade II diastolic dysfunction (pseudonormalization). Elevated left atrial pressure. There is severe hypokinesis of the left ventricular, entire anteroseptal wall and anterior wall. There is mild dyskinesis of the left ventricular, apical  anterior wall and apical segment.  2. Right ventricular systolic function is normal. The right ventricular size is normal. There is mildly elevated pulmonary artery  systolic pressure.  3. A small pericardial effusion is present. The pericardial effusion is circumferential.  4. The mitral valve is normal in structure. No evidence of mitral valve regurgitation.  5. The aortic valve is normal in structure. Aortic valve regurgitation is mild.  6. The inferior vena cava is normal in size with <50% respiratory variability, suggesting right atrial pressure of 8 mmHg. FINDINGS  Left Ventricle: No left ventricular thrombus. Left ventricular ejection fraction, by estimation, is 25 to 30%. The left ventricle has severely decreased function. The left ventricle demonstrates regional wall motion abnormalities. Severe hypokinesis of the left ventricular, entire anteroseptal wall and anterior wall. Mild dyskinesis of the left ventricular, apical anterior wall and apical segment. 3D left ventricular ejection fraction analysis performed but not reported based on interpreter judgement due to suboptimal quality. The left ventricular internal cavity size was normal in size. There is no left ventricular hypertrophy. Left ventricular diastolic parameters are consistent with Grade II diastolic dysfunction (pseudonormalization). Elevated left atrial pressure.  LV Wall Scoring: The apical anterior segment and apex are dyskinetic. The anterior wall, entire anterior septum, apical lateral segment, mid inferoseptal segment, and apical inferior segment are hypokinetic. The antero-lateral wall, inferior wall, posterior wall, and basal inferoseptal segment are normal. Right Ventricle: The right ventricular size is normal. No increase in right ventricular wall thickness. Right ventricular systolic function is normal. There is mildly elevated pulmonary artery systolic pressure. The tricuspid regurgitant velocity is 2.63  m/s, and with an assumed  right atrial pressure of 8 mmHg, the estimated right ventricular systolic pressure is 57.3 mmHg. Left Atrium: Left atrial size was normal in size. Right Atrium: Right atrial size was normal in size. Pericardium: A small pericardial effusion is present. The pericardial effusion is circumferential. Mitral Valve: The mitral valve is normal in structure. No evidence of mitral valve regurgitation. MV peak gradient, 2.7 mmHg. The mean mitral valve gradient is 1.0 mmHg. Tricuspid Valve: The tricuspid valve is normal in structure. Tricuspid valve regurgitation is mild. Aortic Valve: The aortic valve is normal in structure. Aortic valve regurgitation is mild. Aortic valve mean gradient measures 2.0 mmHg. Aortic valve peak gradient measures 3.3 mmHg. Aortic valve area, by VTI measures 1.59 cm. Pulmonic Valve: The pulmonic valve was normal in structure. Pulmonic valve regurgitation is trivial. Aorta: The aortic root is normal in size and structure. Venous: The inferior vena cava is normal in size with less than 50% respiratory variability, suggesting right atrial pressure of 8 mmHg. IAS/Shunts: No atrial level shunt detected by color flow Doppler. Additional Comments: A venous catheter is visualized in the right atrium.  LEFT VENTRICLE PLAX 2D LVIDd:         3.80 cm  Diastology LVIDs:         2.30 cm  LV e' medial:    4.46 cm/s LV PW:         1.10 cm  LV E/e' medial:  11.5 LV IVS:        0.90 cm  LV e' lateral:   5.44 cm/s LVOT diam:     1.70 cm  LV E/e' lateral: 9.4 LV SV:         28 LV SV Index:   21 LVOT Area:     2.27 cm                          3D Volume EF:  3D EF:        36 %                         LV EDV:       97 ml                         LV ESV:       62 ml                         LV SV:        35 ml RIGHT VENTRICLE            IVC RV Basal diam:  2.50 cm    IVC diam: 1.80 cm RV S prime:     9.79 cm/s TAPSE (M-mode): 1.2 cm LEFT ATRIUM             Index       RIGHT ATRIUM          Index LA  diam:        1.90 cm 1.40 cm/m  RA Area:     7.28 cm LA Vol (A2C):   23.0 ml 16.95 ml/m RA Volume:   13.40 ml 9.88 ml/m LA Vol (A4C):   23.2 ml 17.10 ml/m LA Biplane Vol: 24.4 ml 17.98 ml/m  AORTIC VALVE AV Area (Vmax):    1.65 cm AV Area (Vmean):   1.60 cm AV Area (VTI):     1.59 cm AV Vmax:           91.00 cm/s AV Vmean:          64.500 cm/s AV VTI:            0.176 m AV Peak Grad:      3.3 mmHg AV Mean Grad:      2.0 mmHg LVOT Vmax:         66.20 cm/s LVOT Vmean:        45.600 cm/s LVOT VTI:          0.123 m LVOT/AV VTI ratio: 0.70  AORTA Ao Root diam: 2.90 cm Ao Asc diam:  3.10 cm MITRAL VALVE               TRICUSPID VALVE MV Area (PHT): 3.77 cm    TR Peak grad:   27.7 mmHg MV Area VTI:   1.81 cm    TR Vmax:        263.00 cm/s MV Peak grad:  2.7 mmHg MV Mean grad:  1.0 mmHg    SHUNTS MV Vmax:       0.81 m/s    Systemic VTI:  0.12 m MV Vmean:      49.0 cm/s   Systemic Diam: 1.70 cm MV Decel Time: 201 msec MV E velocity: 51.40 cm/s MV A velocity: 41.60 cm/s MV E/A ratio:  1.24 Mihai Croitoru MD Electronically signed by Sanda Klein MD Signature Date/Time: 01/31/2021/12:09:51 PM    Final    ECHOCARDIOGRAM LIMITED  Result Date: 02/13/2021    ECHOCARDIOGRAM LIMITED REPORT   Patient Name:   Colleen Fleming Date of Exam: 02/13/2021 Medical Rec #:  [id number]     Height:       57.0 in Accession #:    [id number]    Weight:       95.2 lb Date of Birth:  October 21, 1955     BSA:  1.312 m Patient Age:    36 years      BP:           114/72 mmHg Patient Gender: F             HR:           75 bpm. Exam Location:  Inpatient Procedure: 2D Echo, Cardiac Doppler and Color Doppler Indications:    CHF-Acute Systolic  History:        Patient has prior history of Echocardiogram examinations, most                 recent 02/01/2021. Previous Myocardial Infarction and CAD; Risk                 Factors:Dyslipidemia and Current Smoker. GERD.  Sonographer:    Clayton Lefort RDCS (AE) Referring Phys: [place]  1. Akinesis of the anteroseptal, apical and distal inferior walls with overall severe LV dysfunction; large, predominantly anterior pericardial effusion; respiratory flow variation across mitral valve and RA collapse suggestive of early tamponade physiology but no RV diastolic collapse and IVC not dilated and collapses. Compared to 02/01/21, pericardial effusion is larger.  2. Left ventricular ejection fraction, by estimation, is 20 to 25%. The left ventricle has severely decreased function. The left ventricle demonstrates regional wall motion abnormalities (see scoring diagram/findings for description). There is moderate left ventricular hypertrophy of the basal-septal segment.  3. Right ventricular systolic function is normal. The right ventricular size is normal.  4. Large pericardial effusion.  5. The mitral valve is normal in structure. No evidence of mitral valve regurgitation. No evidence of mitral stenosis.  6. The aortic valve is tricuspid. Aortic valve regurgitation is not visualized. No aortic stenosis is present.  7. The inferior vena cava is normal in size with greater than 50% respiratory variability, suggesting right atrial pressure of 3 mmHg. FINDINGS  Left Ventricle: Left ventricular ejection fraction, by estimation, is 20 to 25%. The left ventricle has severely decreased function. The left ventricle demonstrates regional wall motion abnormalities. The left ventricular internal cavity size was normal  in size. There is moderate left ventricular hypertrophy of the basal-septal segment. Right Ventricle: The right ventricular size is normal.Right ventricular systolic function is normal. Left Atrium: Left atrial size was normal in size. Right Atrium: Right atrial size was normal in size. Pericardium: A large pericardial effusion is present. Mitral Valve: The mitral valve is normal in structure. No evidence of mitral valve stenosis. Tricuspid Valve: The tricuspid valve is normal in structure.  Tricuspid valve regurgitation is trivial. No evidence of tricuspid stenosis. Aortic Valve: The aortic valve is tricuspid. Aortic valve regurgitation is not visualized. No aortic stenosis is present. Pulmonic Valve: The pulmonic valve was not well visualized. No evidence of pulmonic stenosis. Aorta: The aortic root is normal in size and structure. Venous: The inferior vena cava is normal in size with greater than 50% respiratory variability, suggesting right atrial pressure of 3 mmHg.  Additional Comments: Akinesis of the anteroseptal, apical and distal inferior walls with overall severe LV dysfunction; large, predominantly anterior pericardial effusion; respiratory flow variation across mitral valve and RA collapse suggestive of early  tamponade physiology but no RV diastolic collapse and IVC not dilated and collapses. Compared to 02/01/21, pericardial effusion is larger. LEFT VENTRICLE PLAX 2D LVIDd:         3.50 cm LVIDs:         2.70 cm LV PW:  1.10 cm LV IVS:        1.40 cm LVOT diam:     1.80 cm LVOT Area:     2.54 cm  LV Volumes (MOD) LV vol d, MOD A2C: 54.7 ml LV vol d, MOD A4C: 54.4 ml LV vol s, MOD A2C: 38.2 ml LV vol s, MOD A4C: 42.6 ml LV SV MOD A2C:     16.5 ml LV SV MOD A4C:     54.4 ml LV SV MOD BP:      15.4 ml IVC IVC diam: 1.50 cm LEFT ATRIUM         Index LA diam:    3.00 cm 2.29 cm/m   AORTA Ao Root diam: 3.10 cm Ao Asc diam:  2.80 cm  SHUNTS Systemic Diam: 1.80 cm Kirk Ruths MD Electronically signed by Kirk Ruths MD Signature Date/Time: 02/13/2021/3:22:45 PM    Final    ECHOCARDIOGRAM LIMITED  Result Date: 02/01/2021    ECHOCARDIOGRAM LIMITED REPORT   Patient Name:   Colleen Fleming Date of Exam: 02/01/2021 Medical Rec #:  [id number]     Height:       57.0 in Accession #:    [id number]    Weight:       97.0 lb Date of Birth:  December 18, 1955     BSA:          1.322 m Patient Age:    66 years      BP:           94/64 mmHg Patient Gender: F             HR:           92 bpm. Exam Location:   Inpatient Procedure: Limited Color Doppler, Cardiac Doppler, Color Doppler and Limited            Echo STAT ECHO Indications:    Pericardial effusion with hypotension. Assess for tamponade  History:        Patient has prior history of Echocardiogram examinations, most                 recent 01/31/2021. CAD and Previous Myocardial Infarction,                 Abnormal ECG and Left heart cath; Signs/Symptoms:Hypotension.                 Frequent falls with broken ribs. Lung cancer.  Sonographer:    Merrie Roof Referring Phys: [place] Comments: This was a limited echo to rule out tamponade. IMPRESSIONS  1. Left ventricular ejection fraction, by estimation, is 25%. The left ventricle has severely decreased function. The left ventricle demonstrates regional wall motion abnormalities with mid to apical anteroseptal/inferoseptal/anterior akinesis, apical lateral akinesis, and akinesis of the true apex. Consistent with large LAD territory MI. There is mild left ventricular hypertrophy.  2. Right ventricular systolic function is normal. The right ventricular size is normal.  3. Small to moderate pericardial effusion primarily adjacent to the RV. The IVC collapses around 50% with respiration. Difficult to interpret respirometry due to frequent ectopy. RV diastolic collapse is not present. No overt pericardial tamponade. Pericardial effusion is similar to the prior study.  4. The inferior vena cava is normal in size with <50% respiratory variability, suggesting right atrial pressure of 8 mmHg.  5. Limited echo FINDINGS  Left Ventricle: Left ventricular ejection fraction, by estimation, is 25%. The left ventricle has severely decreased function. The  left ventricle demonstrates regional wall motion abnormalities. The left ventricular internal cavity size was normal in size. There is mild left ventricular hypertrophy. Right Ventricle: The right ventricular size is normal. No increase in right ventricular wall  thickness. Right ventricular systolic function is normal. Left Atrium: Left atrial size was normal in size. Right Atrium: Right atrial size was normal in size. Pericardium: Small to moderate pericardial effusion primarily adjacent to the RV. The IVC collapses around 50% with respiration. Difficult to interpret respirometry due to frequent ectopy. RV diastolic collapse is not present. No overt pericardial tamponade. Venous: The inferior vena cava is normal in size with less than 50% respiratory variability, suggesting right atrial pressure of 8 mmHg. IAS/Shunts: No atrial level shunt detected by color flow Doppler. IVC IVC diam: 2.00 cm Loralie Champagne MD Electronically signed by Loralie Champagne MD Signature Date/Time: 02/01/2021/4:32:21 PM    Final    [place] EKG SITE RITE  Result Date: 02/12/2021 If Site Rite image not attached, placement could not be confirmed due to current cardiac rhythm.  [place] EKG SITE RITE  Result Date: 02/12/2021 If Site Rite image not attached, placement could not be confirmed due to current cardiac rhythm.

## 2021-02-19 DIAGNOSIS — I5022 Chronic systolic (congestive) heart failure: Secondary | ICD-10-CM | POA: Diagnosis not present

## 2021-02-19 DIAGNOSIS — E871 Hypo-osmolality and hyponatremia: Secondary | ICD-10-CM | POA: Diagnosis not present

## 2021-02-19 LAB — BASIC METABOLIC PANEL
Anion gap: 7 (ref 5–15)
BUN: 11 mg/dL (ref 8–23)
CO2: 27 mmol/L (ref 22–32)
Calcium: 8.6 mg/dL — ABNORMAL LOW (ref 8.9–10.3)
Chloride: 96 mmol/L — ABNORMAL LOW (ref 98–111)
Creatinine, Ser: 0.64 mg/dL (ref 0.44–1.00)
GFR, Estimated: >60 mL/min (ref >60)
Glucose, Bld: 94 mg/dL (ref 70–99)
Potassium: 4 mmol/L (ref 3.5–5.1)
Sodium: 130 mmol/L — ABNORMAL LOW (ref 135–145)

## 2021-02-19 NOTE — Progress Notes (Signed)
Pt with no urine output this shift.  Bladder scan only shows 156ml.

## 2021-02-19 NOTE — Progress Notes (Addendum)
PROGRESS NOTE                                                                                                                                                                                                             Patient Demographics:    Colleen Fleming, is a 65 y.o. female, DOB - [birth year], MBT:[id number]  Outpatient Primary MD for the patient is Belva Bertin, [place]   Admit date - 02/09/2021   LOS - 9  Chief Complaint  Patient presents with  . Weakness       Brief Narrative: Patient is a 65 y.o. female with PMHx of CAD-recent PCI, chronic systolic heart failure-presented with diarrhea-found to have severe hyponatremia.  Found to have COVID-19 infection.  COVID-19 vaccinated status:  Significant Events: 5/10>> Admit to [facility] for diarrhea-vit D-found to have hyponatremia-sodium 117. 5/16>> underwent RHC-low filling pressures, moderately decreased cardiac output. 5/17>> plan to discharge-however worsening weakness-PT now recommending SNF.  Discharge held.  Significant studies: 5/10>>Chest x-ray: No pneumonia  COVID-19 medications: Remdesivir:5/11>> 5/13  Antibiotics: None  Microbiology data: None  Procedures: 5/16>> RHC: RA = 2 RV = 17/2 PA = 17/4 (8) PCW = 4 Fick cardiac output/index = 3.0/2.4 Thermo CO/CI = 2.8/2.2 PVR = 1.3 Ao sat = 98% PA sat = 64%, 65  Consults: Advanced heart failure team.  DVT prophylaxis: SQ Lovenox   Subjective:   Lying comfortably in bed-no major events overnight.   Assessment  & Plan :   Hyponatremia: Felt to be due to hypovolemia in the setting of COVID-19 related gastroenteritis/diarrhea.  She initially was treated with IV fluids-she is now volume replete.  Sodium level slowly improving with just fluid restriction.  Persistent mild hyponatremia now is felt to be from underlying CHF.  Hypokalemia: Due to GI losses-resolved.  Diarrhea: Likely from COVID  19 infection-resolved.  Orthostatic hypotension: Placed TED stockings-not on diuretics or antihypertensives.  COVID-19 infection: Likely causing diarrhea-on Remdesivir x3 days.  Needs a total of 10 days of isolation from 5/11.  Hypotension: Due to combination of hypovolemia and underlying chronic systolic heart failure.  BP stabilizing on midodrine.    Recent STEMI-s/p PCI: No anginal symptoms-on antiplatelets/statin  Chronic systolic heart failure (EF 25% on echo 5/2): Volume status stable-see above.  No beta-blocker or spironolactone due to low BP.  Tolerating digoxin.  Per cardiology  use 20 mg of Lasix daily for weight> 95 pounds.  Will need outpatient follow-up with CHF team for addition of other medications.  Weakness/debility: Due to acute illness-COVID-19 infection-has a very poor appetite-only drinking Ensure and fluids per nursing staff-we will liberalize diet for a few days-changed to regular diet.  COVID-19 Labs: No results for input(s): DDIMER, FERRITIN, LDH, CRP in the last 72 hours.     Component Value Date/Time   BNP 792.9 (H) 02/01/2021 1100    No results for input(s): PROCALCITON in the last 168 hours.  Lab Results  Component Value Date   SARSCOV2NAA POSITIVE (A) 02/10/2021   [place] NEGATIVE 01/30/2021     ABG:    Component Value Date/Time   HCO3 23.3 02/15/2021 1354   TCO2 24 02/15/2021 1354   ACIDBASEDEF 1.0 02/15/2021 1354   O2SAT 64.0 02/15/2021 1354    Vent Settings: N/A  Condition - Stable  Family Communication  : Son-[name]-[phone] updated over the phone on 5/17  Code Status :  Full Code  Diet :  Diet Order            Diet Heart Room service appropriate? Yes; Fluid consistency: Thin  Diet effective now           Diet - low sodium heart healthy                  Disposition Plan  :   Status is: Inpatient  Remains inpatient appropriate because:Inpatient level of care appropriate due to severity of illness   Dispo:  Patient  From: Home  Planned Disposition: Home  Medically stable for discharge: Yes      Barriers to discharge: Awaiting SNF bed-however needs to complete 10 days of isolation before discharge.  Antimicorbials  :    Anti-infectives (From admission, onward)   Start     Dose/Rate Route Frequency Ordered Stop   02/11/21 1000  remdesivir 100 mg in sodium chloride 0.9 % 100 mL IVPB       "Followed by" Linked Group Details   100 mg 200 mL/hr over 30 Minutes Intravenous Daily 02/10/21 1410 02/12/21 0951   02/10/21 1515  remdesivir 200 mg in sodium chloride 0.9% 250 mL IVPB       "Followed by" Linked Group Details   200 mg 580 mL/hr over 30 Minutes Intravenous Once 02/10/21 1410 02/10/21 1733      Inpatient Medications  Scheduled Meds: . aspirin EC  81 mg Oral Daily  . atorvastatin  80 mg Oral Daily  . Chlorhexidine Gluconate Cloth  6 each Topical Daily  . digoxin  0.125 mg Oral Daily  . enoxaparin (LOVENOX) injection  20 mg Subcutaneous Q24H  . midodrine  10 mg Oral TID WC  . sodium chloride flush  10-40 mL Intracatheter Q12H  . ticagrelor  90 mg Oral BID   Continuous Infusions:  PRN Meds:.acetaminophen, melatonin, nitroGLYCERIN, prochlorperazine, sodium chloride flush   Time Spent in minutes  15    See all Orders from today for further details   Oren Binet M.D on 02/19/2021 at 11:50 AM  To page go to [email or web address] - use universal password  [facility] -  Office  [phone]    Objective:   Vitals:   02/18/21 0800 02/18/21 0822 02/18/21 2058 02/19/21 0438  BP:   103/74   Pulse:   75   Resp: 20  (!) 22   Temp:  97.8 F (36.6 C) 97.8 F (36.6 C)   TempSrc:  Oral Oral  SpO2:   98%   Weight:    41.5 kg    Wt Readings from Last 3 Encounters:  02/19/21 41.5 kg  02/04/21 43.4 kg  11/25/11 56.6 kg     Intake/Output Summary (Last 24 hours) at 02/19/2021 1150 Last data filed at 02/19/2021 1045 Gross per 24 hour  Intake 957 ml  Output 400 ml  Net 557 ml      Physical Exam Gen Exam:Alert awake-not in any distress HEENT:atraumatic, normocephalic Chest: B/L clear to auscultation anteriorly CVS:S1S2 regular Abdomen:soft non tender, non distended Extremities:no edema Neurology: Non focal Skin: no rash   Data Review:    CBC Recent Labs  Lab 02/15/21 1353 02/15/21 1354  HGB 11.6* 11.6*  HCT 34.0* 34.0*    Chemistries  Recent Labs  Lab 02/13/21 0319 02/14/21 0550 02/15/21 0650 02/15/21 1353 02/15/21 1354 02/16/21 0352 02/17/21 0703 02/19/21 0457  NA 125* 126* 128* 130* 129* 129* 131* 130*  K 3.7 3.7 3.5 3.3* 3.4* 3.3* 4.2 4.0  CL 96* 97* 98  --   --  98 97* 96*  CO2 21* 21* 22  --   --  22 27 27   GLUCOSE 97 96 95  --   --  84 97 94  BUN 10 8 7*  --   --  7* 9 11  CREATININE 0.69 0.66 0.68  --   --  0.74 0.78 0.64  CALCIUM 7.8* 8.3* 8.4*  --   --  8.4* 8.6* 8.6*  AST 23 25 29   --   --   --   --   --   ALT 19 20 22   --   --   --   --   --   ALKPHOS 66 78 84  --   --   --   --   --   BILITOT 0.7 0.8 0.9  --   --   --   --   --    ------------------------------------------------------------------------------------------------------------------ No results for input(s): CHOL, HDL, LDLCALC, TRIG, CHOLHDL, LDLDIRECT in the last 72 hours.  Lab Results  Component Value Date   HGBA1C 5.4 01/31/2021   ------------------------------------------------------------------------------------------------------------------ No results for input(s): TSH, T4TOTAL, T3FREE, THYROIDAB in the last 72 hours.  Invalid input(s): FREET3 ------------------------------------------------------------------------------------------------------------------ No results for input(s): VITAMINB12, FOLATE, FERRITIN, TIBC, IRON, RETICCTPCT in the last 72 hours.  Coagulation profile No results for input(s): INR, PROTIME in the last 168 hours.  No results for input(s): DDIMER in the last 72 hours.  Cardiac Enzymes No results for input(s): CKMB,  TROPONINI, MYOGLOBIN in the last 168 hours.  Invalid input(s): CK ------------------------------------------------------------------------------------------------------------------    Component Value Date/Time   BNP 792.9 (H) 02/01/2021 1100    Micro Results Recent Results (from the past 240 hour(s))  SARS CORONAVIRUS 2 (TAT 6-24 HRS) Nasopharyngeal Nasopharyngeal Swab     Status: Abnormal   Collection Time: 02/10/21 12:18 AM   Specimen: Nasopharyngeal Swab  Result Value Ref Range Status   SARS Coronavirus 2 POSITIVE (A) NEGATIVE Final    Comment: (NOTE) SARS-CoV-2 target nucleic acids are DETECTED.  The SARS-CoV-2 RNA is generally detectable in upper and lower respiratory specimens during the acute phase of infection. Positive results are indicative of the presence of SARS-CoV-2 RNA. Clinical correlation with patient history and other diagnostic information is  necessary to determine patient infection status. Positive results do not rule out bacterial infection or co-infection with other viruses.  The expected result is Negative.  Fact Sheet for Patients: SugarRoll.be  Fact  Sheet for Healthcare Providers: https://www.woods-mathews.com/  This test is not yet approved or cleared by the [place] FDA and  has been authorized for detection and/or diagnosis of SARS-CoV-2 by FDA under an Emergency Use Authorization (EUA). This EUA will remain  in effect (meaning this test can be used) for the duration of the COVID-19 declaration under Section 564(b)(1) of the Act, 21 U. S.C. section 360bbb-3(b)(1), unless the authorization is terminated or revoked sooner.   Performed at [facility], [place] [address] [place], [place] [address]     Radiology Reports DG Chest 2 View  Result Date: 02/01/2021 CLINICAL DATA:  History of lung cancer EXAM: CHEST - 2 VIEW COMPARISON:  Radiograph 11/25/2011 FINDINGS: Right IJ central venous catheter  tip terminates at the right atrium. Telemetry leads overlie the chest. Chronically coarsened interstitial changes and bronchitic features. Bandlike opacity is seen in the left lung apex with some tenting of the left hemidiaphragm, could reflect subsegmental atelectasis or volume loss though underlying airspace disease or architectural distortion could have a similar appearance. No focal consolidative opacity is seen. The cardiomediastinal contours are unremarkable. Few coronary stents are noted. Age-indeterminate left sixth and seventh posterolateral left rib fractures. No other acute osseous or soft tissue abnormalities the chest wall. IMPRESSION: Chronically coarsened interstitial and bronchitic features. New bandlike opacity in left lung apex with tenting of the left hemidiaphragm. Could reflect scarring or atelectatic change with volume loss. Underlying airspace disease or mass is less favored though not fully excluded. Right IJ approach central venous catheter at the right atrium Posterolateral left sixth and seventh rib fractures, age indeterminate, correlate for point tenderness. Electronically Signed   By: Lovena Le M.D.   On: 02/01/2021 04:25   CARDIAC CATHETERIZATION  Result Date: 02/15/2021 Findings: RA = 2 RV = 17/2 PA = 17/4 (8) PCW = 4 Fick cardiac output/index = 3.0/2.4 Thermo CO/CI = 2.8/2.2 PVR = 1.3 Ao sat = 98% PA sat = 64%, 65% Assessment: 1. Low filling pressures with moderately reduced cardiac output Plan/Discussion: Hydrate gently. No role for inotropic support at this time. Glori Bickers, MD 2:24 PM   CARDIAC CATHETERIZATION  Result Date: 01/30/2021  The left ventricular ejection fraction is 45-50% by visual estimate. Mid to apical anterior hypokinesis.  LV end diastolic pressure is mildly elevated at 13 mmHg, with systolic 98 mmHg.  There is trivial (1+) mitral regurgitation.  --------------------------------------------  Culprit lesion: Prox LAD lesion is 99% stenosed.  (Calcified, thrombotic)  A drug-eluting stent was successfully placed using a SYNERGY XD 2.50X16 -> postdilated 2.8 mm  Post intervention, there is a 0% residual stenosis.  Dist LAD lesion is 80% stenosed -> there is TIMI 0 flow to the apex, post PCI TIMI II flow, but still notable irregularities distally, likely distal embolism.  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  Distal LAD has TIMI I 0 flow.  Successful DES PCI of the LAD using a synergy DES 2.5 mm x 16 mm postdilated to 2.8 mm.  Otherwise angiographically normal coronary arteries with exception of the apical LAD.  Mild mid to apical anterior hypokinesis on LV gram with mildly reduced EF of roughly 45%.  Upper limit of normal LVEDP.  Borderline hypotension with systolic pressures ranging in the 95 to 105 mmHg range -> no evidence of shock  Severe hypOkalemia, i-STAT potassium read is 2.1, labs from ER read is 2.7.  Resolution of ventricular bigeminy. RECOMMENDATION  Admit to CVICU  Will run 3 rounds of IV potassium and recheck in 3 hours.  Continue IV Aggrastat for another 1 hour post PCI.  Check 2D echocardiogram, A1c and lipid panel the morning.  High-dose high intensity statin  Will attempt to start low-dose beta-blocker given tachycardia, however with borderline pressures, may need to hold.  Cardiac rehab consult  Smoking cessation consult Glenetta Hew, MD  DG Chest [facility] 1 View  Result Date: 02/09/2021 CLINICAL DATA:  Weakness, history of recent STEMI EXAM: PORTABLE CHEST 1 VIEW COMPARISON:  01/31/2021 FINDINGS: Cardiac shadow is at the upper limits of normal in size. Prior right jugular central line has been removed in the interval. The lungs are well aerated without focal infiltrate or sizable effusion. Chronic band like density is noted in the left upper lobe medially consistent with prior radiation therapy. This is stable from a prior CT from  12/23/2020 IMPRESSION: No acute abnormality noted. Prior radiation change in the medial left upper lobe. Electronically Signed   By: Inez Catalina M.D.   On: 02/09/2021 23:34   ECHOCARDIOGRAM COMPLETE  Result Date: 01/31/2021    ECHOCARDIOGRAM REPORT   Patient Name:   MAHOGANY TORRANCE Date of Exam: 01/31/2021 Medical Rec #:  [id number]         Height:       58.0 in Accession #:    [id number]        Weight:       100.1 lb Date of Birth:  1956/01/19         BSA:          1.357 m Patient Age:    77 years          BP:           133/91 mmHg Patient Gender: F                 HR:           73 bpm. Exam Location:  Inpatient Procedure: 2D Echo, Cardiac Doppler, Color Doppler and 3D Echo Indications:    Acute myocardial infarction  History:        Patient has no prior history of Echocardiogram examinations.                 Acute MI and CAD; Risk Factors:Current Smoker. GERD.  Sonographer:    Clayton Lefort RDCS (AE) Referring Phys: [place]  1. No left ventricular thrombus. Left ventricular ejection fraction, by estimation, is 25 to 30%. The left ventricle has severely decreased function. The left ventricle demonstrates regional wall motion abnormalities (see scoring diagram/findings for description). Left ventricular diastolic parameters are consistent with Grade II diastolic dysfunction (pseudonormalization). Elevated left atrial pressure. There is severe hypokinesis of the left ventricular, entire anteroseptal wall and anterior wall. There is mild dyskinesis of the left ventricular, apical anterior wall and apical segment.  2. Right ventricular systolic function is normal. The right ventricular size is normal. There is mildly elevated pulmonary artery systolic pressure.  3. A small pericardial effusion is present. The pericardial effusion is circumferential.  4. The mitral valve is normal in structure. No evidence of mitral valve regurgitation.  5. The aortic valve is normal in structure. Aortic valve  regurgitation is mild.  6. The inferior vena cava is normal in size with <50% respiratory variability, suggesting right atrial pressure of 8 mmHg. FINDINGS  Left Ventricle: No left ventricular thrombus. Left ventricular ejection fraction, by estimation, is 25  to 30%. The left ventricle has severely decreased function. The left ventricle demonstrates regional wall motion abnormalities. Severe hypokinesis of the left ventricular, entire anteroseptal wall and anterior wall. Mild dyskinesis of the left ventricular, apical anterior wall and apical segment. 3D left ventricular ejection fraction analysis performed but not reported based on interpreter judgement due to suboptimal quality. The left ventricular internal cavity size was normal in size. There is no left ventricular hypertrophy. Left ventricular diastolic parameters are consistent with Grade II diastolic dysfunction (pseudonormalization). Elevated left atrial pressure.  LV Wall Scoring: The apical anterior segment and apex are dyskinetic. The anterior wall, entire anterior septum, apical lateral segment, mid inferoseptal segment, and apical inferior segment are hypokinetic. The antero-lateral wall, inferior wall, posterior wall, and basal inferoseptal segment are normal. Right Ventricle: The right ventricular size is normal. No increase in right ventricular wall thickness. Right ventricular systolic function is normal. There is mildly elevated pulmonary artery systolic pressure. The tricuspid regurgitant velocity is 2.63  m/s, and with an assumed right atrial pressure of 8 mmHg, the estimated right ventricular systolic pressure is 58.0 mmHg. Left Atrium: Left atrial size was normal in size. Right Atrium: Right atrial size was normal in size. Pericardium: A small pericardial effusion is present. The pericardial effusion is circumferential. Mitral Valve: The mitral valve is normal in structure. No evidence of mitral valve regurgitation. MV peak gradient, 2.7 mmHg.  The mean mitral valve gradient is 1.0 mmHg. Tricuspid Valve: The tricuspid valve is normal in structure. Tricuspid valve regurgitation is mild. Aortic Valve: The aortic valve is normal in structure. Aortic valve regurgitation is mild. Aortic valve mean gradient measures 2.0 mmHg. Aortic valve peak gradient measures 3.3 mmHg. Aortic valve area, by VTI measures 1.59 cm. Pulmonic Valve: The pulmonic valve was normal in structure. Pulmonic valve regurgitation is trivial. Aorta: The aortic root is normal in size and structure. Venous: The inferior vena cava is normal in size with less than 50% respiratory variability, suggesting right atrial pressure of 8 mmHg. IAS/Shunts: No atrial level shunt detected by color flow Doppler. Additional Comments: A venous catheter is visualized in the right atrium.  LEFT VENTRICLE PLAX 2D LVIDd:         3.80 cm  Diastology LVIDs:         2.30 cm  LV e' medial:    4.46 cm/s LV PW:         1.10 cm  LV E/e' medial:  11.5 LV IVS:        0.90 cm  LV e' lateral:   5.44 cm/s LVOT diam:     1.70 cm  LV E/e' lateral: 9.4 LV SV:         28 LV SV Index:   21 LVOT Area:     2.27 cm                          3D Volume EF:                         3D EF:        36 %                         LV EDV:       97 ml  LV ESV:       62 ml                         LV SV:        35 ml RIGHT VENTRICLE            IVC RV Basal diam:  2.50 cm    IVC diam: 1.80 cm RV S prime:     9.79 cm/s TAPSE (M-mode): 1.2 cm LEFT ATRIUM             Index       RIGHT ATRIUM          Index LA diam:        1.90 cm 1.40 cm/m  RA Area:     7.28 cm LA Vol (A2C):   23.0 ml 16.95 ml/m RA Volume:   13.40 ml 9.88 ml/m LA Vol (A4C):   23.2 ml 17.10 ml/m LA Biplane Vol: 24.4 ml 17.98 ml/m  AORTIC VALVE AV Area (Vmax):    1.65 cm AV Area (Vmean):   1.60 cm AV Area (VTI):     1.59 cm AV Vmax:           91.00 cm/s AV Vmean:          64.500 cm/s AV VTI:            0.176 m AV Peak Grad:      3.3 mmHg AV Mean Grad:       2.0 mmHg LVOT Vmax:         66.20 cm/s LVOT Vmean:        45.600 cm/s LVOT VTI:          0.123 m LVOT/AV VTI ratio: 0.70  AORTA Ao Root diam: 2.90 cm Ao Asc diam:  3.10 cm MITRAL VALVE               TRICUSPID VALVE MV Area (PHT): 3.77 cm    TR Peak grad:   27.7 mmHg MV Area VTI:   1.81 cm    TR Vmax:        263.00 cm/s MV Peak grad:  2.7 mmHg MV Mean grad:  1.0 mmHg    SHUNTS MV Vmax:       0.81 m/s    Systemic VTI:  0.12 m MV Vmean:      49.0 cm/s   Systemic Diam: 1.70 cm MV Decel Time: 201 msec MV E velocity: 51.40 cm/s MV A velocity: 41.60 cm/s MV E/A ratio:  1.24 Mihai Croitoru MD Electronically signed by Sanda Klein MD Signature Date/Time: 01/31/2021/12:09:51 PM    Final    ECHOCARDIOGRAM LIMITED  Result Date: 02/13/2021    ECHOCARDIOGRAM LIMITED REPORT   Patient Name:   CHENOA LUDDY Date of Exam: 02/13/2021 Medical Rec #:  [id number]     Height:       57.0 in Accession #:    [id number]    Weight:       95.2 lb Date of Birth:  [birth year]     BSA:          1.312 m Patient Age:    8 years      BP:           114/72 mmHg Patient Gender: F             HR:           75 bpm. Exam Location:  Inpatient Procedure: 2D  Echo, Cardiac Doppler and Color Doppler Indications:    CHF-Acute Systolic  History:        Patient has prior history of Echocardiogram examinations, most                 recent 02/01/2021. Previous Myocardial Infarction and CAD; Risk                 Factors:Dyslipidemia and Current Smoker. GERD.  Sonographer:    Clayton Lefort RDCS (AE) Referring Phys: [place]  1. Akinesis of the anteroseptal, apical and distal inferior walls with overall severe LV dysfunction; large, predominantly anterior pericardial effusion; respiratory flow variation across mitral valve and RA collapse suggestive of early tamponade physiology but no RV diastolic collapse and IVC not dilated and collapses. Compared to 02/01/21, pericardial effusion is larger.  2. Left ventricular ejection fraction, by  estimation, is 20 to 25%. The left ventricle has severely decreased function. The left ventricle demonstrates regional wall motion abnormalities (see scoring diagram/findings for description). There is moderate left ventricular hypertrophy of the basal-septal segment.  3. Right ventricular systolic function is normal. The right ventricular size is normal.  4. Large pericardial effusion.  5. The mitral valve is normal in structure. No evidence of mitral valve regurgitation. No evidence of mitral stenosis.  6. The aortic valve is tricuspid. Aortic valve regurgitation is not visualized. No aortic stenosis is present.  7. The inferior vena cava is normal in size with greater than 50% respiratory variability, suggesting right atrial pressure of 3 mmHg. FINDINGS  Left Ventricle: Left ventricular ejection fraction, by estimation, is 20 to 25%. The left ventricle has severely decreased function. The left ventricle demonstrates regional wall motion abnormalities. The left ventricular internal cavity size was normal  in size. There is moderate left ventricular hypertrophy of the basal-septal segment. Right Ventricle: The right ventricular size is normal.Right ventricular systolic function is normal. Left Atrium: Left atrial size was normal in size. Right Atrium: Right atrial size was normal in size. Pericardium: A large pericardial effusion is present. Mitral Valve: The mitral valve is normal in structure. No evidence of mitral valve stenosis. Tricuspid Valve: The tricuspid valve is normal in structure. Tricuspid valve regurgitation is trivial. No evidence of tricuspid stenosis. Aortic Valve: The aortic valve is tricuspid. Aortic valve regurgitation is not visualized. No aortic stenosis is present. Pulmonic Valve: The pulmonic valve was not well visualized. No evidence of pulmonic stenosis. Aorta: The aortic root is normal in size and structure. Venous: The inferior vena cava is normal in size with greater than 50%  respiratory variability, suggesting right atrial pressure of 3 mmHg.  Additional Comments: Akinesis of the anteroseptal, apical and distal inferior walls with overall severe LV dysfunction; large, predominantly anterior pericardial effusion; respiratory flow variation across mitral valve and RA collapse suggestive of early  tamponade physiology but no RV diastolic collapse and IVC not dilated and collapses. Compared to 02/01/21, pericardial effusion is larger. LEFT VENTRICLE PLAX 2D LVIDd:         3.50 cm LVIDs:         2.70 cm LV PW:         1.10 cm LV IVS:        1.40 cm LVOT diam:     1.80 cm LVOT Area:     2.54 cm  LV Volumes (MOD) LV vol d, MOD A2C: 54.7 ml LV vol d, MOD A4C: 54.4 ml LV vol s, MOD A2C: 38.2 ml LV vol  s, MOD A4C: 42.6 ml LV SV MOD A2C:     16.5 ml LV SV MOD A4C:     54.4 ml LV SV MOD BP:      15.4 ml IVC IVC diam: 1.50 cm LEFT ATRIUM         Index LA diam:    3.00 cm 2.29 cm/m   AORTA Ao Root diam: 3.10 cm Ao Asc diam:  2.80 cm  SHUNTS Systemic Diam: 1.80 cm Kirk Ruths MD Electronically signed by Kirk Ruths MD Signature Date/Time: 02/13/2021/3:22:45 PM    Final    ECHOCARDIOGRAM LIMITED  Result Date: 02/01/2021    ECHOCARDIOGRAM LIMITED REPORT   Patient Name:   ZYLEE MARCHIANO Date of Exam: 02/01/2021 Medical Rec #:  [id number]     Height:       57.0 in Accession #:    [id number]    Weight:       97.0 lb Date of Birth:  08-Dec-1955     BSA:          1.322 m Patient Age:    12 years      BP:           94/64 mmHg Patient Gender: F             HR:           92 bpm. Exam Location:  Inpatient Procedure: Limited Color Doppler, Cardiac Doppler, Color Doppler and Limited            Echo STAT ECHO Indications:    Pericardial effusion with hypotension. Assess for tamponade  History:        Patient has prior history of Echocardiogram examinations, most                 recent 01/31/2021. CAD and Previous Myocardial Infarction,                 Abnormal ECG and Left heart cath; Signs/Symptoms:Hypotension.                  Frequent falls with broken ribs. Lung cancer.  Sonographer:    Merrie Roof Referring Phys: [place] Comments: This was a limited echo to rule out tamponade. IMPRESSIONS  1. Left ventricular ejection fraction, by estimation, is 25%. The left ventricle has severely decreased function. The left ventricle demonstrates regional wall motion abnormalities with mid to apical anteroseptal/inferoseptal/anterior akinesis, apical lateral akinesis, and akinesis of the true apex. Consistent with large LAD territory MI. There is mild left ventricular hypertrophy.  2. Right ventricular systolic function is normal. The right ventricular size is normal.  3. Small to moderate pericardial effusion primarily adjacent to the RV. The IVC collapses around 50% with respiration. Difficult to interpret respirometry due to frequent ectopy. RV diastolic collapse is not present. No overt pericardial tamponade. Pericardial effusion is similar to the prior study.  4. The inferior vena cava is normal in size with <50% respiratory variability, suggesting right atrial pressure of 8 mmHg.  5. Limited echo FINDINGS  Left Ventricle: Left ventricular ejection fraction, by estimation, is 25%. The left ventricle has severely decreased function. The left ventricle demonstrates regional wall motion abnormalities. The left ventricular internal cavity size was normal in size. There is mild left ventricular hypertrophy. Right Ventricle: The right ventricular size is normal. No increase in right ventricular wall thickness. Right ventricular systolic function is normal. Left Atrium: Left atrial size was normal in size. Right Atrium: Right  atrial size was normal in size. Pericardium: Small to moderate pericardial effusion primarily adjacent to the RV. The IVC collapses around 50% with respiration. Difficult to interpret respirometry due to frequent ectopy. RV diastolic collapse is not present. No overt pericardial tamponade.  Venous: The inferior vena cava is normal in size with less than 50% respiratory variability, suggesting right atrial pressure of 8 mmHg. IAS/Shunts: No atrial level shunt detected by color flow Doppler. IVC IVC diam: 2.00 cm Loralie Champagne MD Electronically signed by Loralie Champagne MD Signature Date/Time: 02/01/2021/4:32:21 PM    Final    [place] EKG SITE RITE  Result Date: 02/12/2021 If Site Rite image not attached, placement could not be confirmed due to current cardiac rhythm.  [place] EKG SITE RITE  Result Date: 02/12/2021 If Site Rite image not attached, placement could not be confirmed due to current cardiac rhythm.

## 2021-02-19 NOTE — Progress Notes (Signed)
Physical Therapy Treatment Patient Details Name: Colleen Fleming MRN: [id number] DOB: [birth year] Today's Date: 02/19/2021    History of Present Illness Pt is a 65 y.o. female admitted 02/09/21 with diarrhea; found to have severe hyponatremia, (+) COVID-19. CXR negative for PNA. S/p RHC 5/16 which showed low filling pressures, moderately reduced CO. Of note, recent admission 01/30/21 with STEMI s/p cath with PCA. PMH includes lung CA, COPD, DJD, anxiety.    PT Comments    Pt awake and pleasant upon PT arrival to room, reports wanting to mobilize to get home soon. Pt requiring mod assist for bed mobility and standing tasks which demonstrates improvement from last session, however pt continues to present with limited tolerance and significant debility. Pt also tachycardic to 140s bpm during EOB and standing tasks, RN notified. SNF remains appropriate, will downgrade PT goals at this time.    Follow Up Recommendations  SNF;Supervision for mobility/OOB     Equipment Recommendations  None recommended by PT (continue to assess pending disp)    Recommendations for Other Services       Precautions / Restrictions Precautions Precautions: Fall;Other (comment) Precaution Comments: (+) COVID; watch HR (tachycardia) Restrictions Weight Bearing Restrictions: No    Mobility  Bed Mobility Overal bed mobility: Needs Assistance Bed Mobility: Rolling;Supine to Sit;Sit to Supine Rolling: Mod assist   Supine to sit: Mod assist Sit to supine: Min assist   General bed mobility comments: min-mod assist for supine<>sit for trunk elevation and lowering, completion of LE translation in/out of bed. Verbal cuing for sequencing task. Rolling bilaterally x2 for pericare.    Transfers Overall transfer level: Needs assistance Equipment used: 1 person hand held assist Transfers: Sit to/from Stand Sit to Stand: Mod assist;From elevated surface         General transfer comment: Mod assist for power up,  rise, and steady, standing tolerance x15 seconds before fatiguing.  Ambulation/Gait             General Gait Details: nt   Marine scientist Rankin (Stroke Patients Only)       Balance Overall balance assessment: Needs assistance Sitting-balance support: Feet supported;Bilateral upper extremity supported Sitting balance-Leahy Scale: Fair Sitting balance - Comments: able to sit EOB with supervision   Standing balance support: Bilateral upper extremity supported Standing balance-Leahy Scale: Poor Standing balance comment: Reliant on UE support                            Cognition Arousal/Alertness: Awake/alert Behavior During Therapy: WFL for tasks assessed/performed Overall Cognitive Status: No family/caregiver present to determine baseline cognitive functioning Area of Impairment: Attention;Following commands;Safety/judgement;Awareness;Problem solving;Orientation                 Orientation Level: Disoriented to;Situation Current Attention Level: Sustained   Following Commands: Follows one step commands with increased time;Follows one step commands consistently Safety/Judgement: Decreased awareness of safety;Decreased awareness of deficits Awareness: Intellectual Problem Solving: Slow processing;Decreased initiation;Difficulty sequencing;Requires verbal cues;Requires tactile cues General Comments: Pt pleasant upon arrival to room, states "let's get up!". Pt requires step-by-step cuing for mobility. Lacks insight into deficits, states "when can I go home?" when pt is still requiring significant assist to stand.      Exercises General Exercises - Lower Extremity Ankle Circles/Pumps: AROM;15 reps;Both;Supine Hip ABduction/ADduction: AROM;Both;10 reps;Supine Straight Leg Raises: AAROM;Both;10 reps;Supine    General Comments  General comments (skin integrity, edema, etc.): HR 119-143 bpm, RN notified.       Pertinent Vitals/Pain Pain Assessment: No/denies pain Pain Intervention(s): Monitored during session    Home Living                      Prior Function            PT Goals (current goals can now be found in the care plan section) Acute Rehab PT Goals PT Goal Formulation: With patient/family Time For Goal Achievement: 03/02/21 Potential to Achieve Goals: Good Progress towards PT goals: Progressing toward goals    Frequency    Min 2X/week      PT Plan Current plan remains appropriate    Co-evaluation              AM-PAC PT "6 Clicks" Mobility   Outcome Measure  Help needed turning from your back to your side while in a flat bed without using bedrails?: A Lot Help needed moving from lying on your back to sitting on the side of a flat bed without using bedrails?: A Lot Help needed moving to and from a bed to a chair (including a wheelchair)?: A Lot Help needed standing up from a chair using your arms (e.g., wheelchair or bedside chair)?: A Lot Help needed to walk in hospital room?: Total Help needed climbing 3-5 steps with a railing? : Total 6 Click Score: 10    End of Session   Activity Tolerance: Patient tolerated treatment well Patient left: in bed;with call bell/phone within reach;with bed alarm set (NT notified she needs new mesh briefs and maybe new purewick) Nurse Communication: Mobility status;Other (comment) (tachycardia) PT Visit Diagnosis: Unsteadiness on feet (R26.81);Other abnormalities of gait and mobility (R26.89)     Time: 6644-0347 PT Time Calculation (min) (ACUTE ONLY): 18 min  Charges:  $Therapeutic Activity: 8-22 mins                     Colleen Fleming, PT DPT Acute Rehabilitation Services Pager [phone]  Office [phone]    Colleen Fleming 02/19/2021, 4:48 PM

## 2021-02-19 NOTE — TOC Progression Note (Addendum)
Transition of Care (TOC) - Progression Note  Heart Failure   Patient Details  Name: TAQUANA BARTLEY MRN: [id number] Date of Birth: 1956/01/25  Transition of Care [facility] CM/SW [place], [place] Phone Number: 02/19/2021, 9:41 AM  Clinical Narrative:    CSW reached out to [place] at [facility] to find out about the insurance authorization and Rolla Plate reported they have not heard back yet but will let the CSW know as soon as they do.  2:15pm - CSW received a call from [place]ia [place] at [facility] who reported that he received insurance authorization back for the patient which will be good through Monday. Patient will be in room 212B at [facility]. If patient discharges over the weekend Raymond at [facility] would like the discharge summary faxed to the nurses station at [phone] attention unit manager.   CSW called the patients son [name] [phone] to notify him that the insurance approved his mom for rehab until Monday and that if she will need longer than that the facility will send another insurance authorization through. [name] reported that it may be easiest to arrange for transportation for the patient to the facility upon patients discharge.  TOC will continue to follow for discharge needs.    Expected Discharge Plan: Skilled Nursing Facility Barriers to Discharge: SNF Covid,SNF Covid Recovering  Expected Discharge Plan and Services Expected Discharge Plan: [place] In-house Referral: Clinical Social Work Discharge Planning Services: CM Consult Post Acute Care Choice: [place] arrangements for the past 2 months: Single Family Home Expected Discharge Date: 02/16/21               DME Arranged: N/A DME Agency: NA                   Social Determinants of Health (SDOH) Interventions Food Insecurity Interventions: Intervention Not Indicated,Assist with SNAP Application (Patient reports she was receiving food stamps  but needs to reapply. CSW to help with application.) Financial Strain Interventions: Intervention Not Indicated Housing Interventions: Intervention Not Indicated Transportation Interventions: Intervention Not Indicated  Readmission Risk Interventions Readmission Risk Prevention Plan 02/04/2021  Post Dischage Appt Complete  Transportation Screening Complete  Some recent data might be hidden   Renley Gutman, MSW, LCSWA [phone] Heart Failure Social Worker

## 2021-02-20 ENCOUNTER — Other Ambulatory Visit: Payer: Self-pay

## 2021-02-20 ENCOUNTER — Emergency Department (HOSPITAL_COMMUNITY): Payer: Medicaid Other

## 2021-02-20 ENCOUNTER — Emergency Department (HOSPITAL_COMMUNITY)
Admission: EM | Admit: 2021-02-20 | Discharge: 2021-02-21 | Disposition: A | Discharge status: Home / Self Care | Payer: Medicaid Other | Attending: Emergency Medicine | Admitting: Emergency Medicine

## 2021-02-20 DIAGNOSIS — S0181XA Laceration without foreign body of other part of head, initial encounter: Secondary | ICD-10-CM

## 2021-02-20 DIAGNOSIS — W010XXA Fall on same level from slipping, tripping and stumbling without subsequent striking against object, initial encounter: Secondary | ICD-10-CM | POA: Insufficient documentation

## 2021-02-20 DIAGNOSIS — S01111A Laceration without foreign body of right eyelid and periocular area, initial encounter: Secondary | ICD-10-CM | POA: Insufficient documentation

## 2021-02-20 DIAGNOSIS — I2510 Atherosclerotic heart disease of native coronary artery without angina pectoris: Secondary | ICD-10-CM | POA: Insufficient documentation

## 2021-02-20 DIAGNOSIS — Z85118 Personal history of other malignant neoplasm of bronchus and lung: Secondary | ICD-10-CM | POA: Insufficient documentation

## 2021-02-20 DIAGNOSIS — R9431 Abnormal electrocardiogram [ECG] [EKG]: Secondary | ICD-10-CM | POA: Diagnosis not present

## 2021-02-20 DIAGNOSIS — W19XXXA Unspecified fall, initial encounter: Secondary | ICD-10-CM

## 2021-02-20 DIAGNOSIS — F17210 Nicotine dependence, cigarettes, uncomplicated: Secondary | ICD-10-CM | POA: Insufficient documentation

## 2021-02-20 DIAGNOSIS — Z23 Encounter for immunization: Secondary | ICD-10-CM | POA: Insufficient documentation

## 2021-02-20 DIAGNOSIS — J449 Chronic obstructive pulmonary disease, unspecified: Secondary | ICD-10-CM | POA: Insufficient documentation

## 2021-02-20 DIAGNOSIS — Z951 Presence of aortocoronary bypass graft: Secondary | ICD-10-CM | POA: Insufficient documentation

## 2021-02-20 DIAGNOSIS — Z7982 Long term (current) use of aspirin: Secondary | ICD-10-CM | POA: Insufficient documentation

## 2021-02-20 LAB — CBC WITH DIFFERENTIAL/PLATELET
Abs Immature Granulocytes: 0.02 10*3/uL (ref 0.00–0.07)
Basophils Absolute: 0.1 10*3/uL (ref 0.0–0.1)
Basophils Relative: 1 %
Eosinophils Absolute: 0 10*3/uL (ref 0.0–0.5)
Eosinophils Relative: 1 %
HCT: 35.1 % — ABNORMAL LOW (ref 36.0–46.0)
Hemoglobin: 11.8 g/dL — ABNORMAL LOW (ref 12.0–15.0)
Immature Granulocytes: 0 %
Lymphocytes Relative: 9 %
Lymphs Abs: 0.7 10*3/uL (ref 0.7–4.0)
MCH: 32.1 pg (ref 26.0–34.0)
MCHC: 33.6 g/dL (ref 30.0–36.0)
MCV: 95.4 fL (ref 80.0–100.0)
Monocytes Absolute: 1.1 10*3/uL — ABNORMAL HIGH (ref 0.1–1.0)
Monocytes Relative: 14 %
Neutro Abs: 5.8 10*3/uL (ref 1.7–7.7)
Neutrophils Relative %: 75 %
Platelets: 330 10*3/uL (ref 150–400)
RBC: 3.68 MIL/uL — ABNORMAL LOW (ref 3.87–5.11)
RDW: 13 % (ref 11.5–15.5)
WBC: 7.8 10*3/uL (ref 4.0–10.5)
nRBC: 0 % (ref 0.0–0.2)

## 2021-02-20 LAB — BASIC METABOLIC PANEL
Anion gap: 8 (ref 5–15)
BUN: 17 mg/dL (ref 8–23)
CO2: 26 mmol/L (ref 22–32)
Calcium: 8.6 mg/dL — ABNORMAL LOW (ref 8.9–10.3)
Chloride: 98 mmol/L (ref 98–111)
Creatinine, Ser: 0.64 mg/dL (ref 0.44–1.00)
GFR, Estimated: >60 mL/min (ref >60)
Glucose, Bld: 96 mg/dL (ref 70–99)
Potassium: 4.5 mmol/L (ref 3.5–5.1)
Sodium: 132 mmol/L — ABNORMAL LOW (ref 135–145)

## 2021-02-20 LAB — TROPONIN I (HIGH SENSITIVITY)
Troponin I (High Sensitivity): 66 ng/L — ABNORMAL HIGH (ref <18)
Troponin I (High Sensitivity): 77 ng/L — ABNORMAL HIGH (ref <18)

## 2021-02-20 LAB — DIGOXIN LEVEL
Digoxin Level: 0.7 ng/mL — ABNORMAL LOW (ref 0.8–2.0)
Digoxin Level: 1 ng/mL (ref 0.8–2.0)

## 2021-02-20 LAB — PROTIME-INR
INR: 0.9 (ref 0.8–1.2)
Prothrombin Time: 12.3 seconds (ref 11.4–15.2)

## 2021-02-20 MED ORDER — LIDOCAINE-EPINEPHRINE (PF) 2 %-1:200000 IJ SOLN
10.0000 mL | Freq: Once | INTRAMUSCULAR | Status: DC
  Filled 2021-02-20: qty 20

## 2021-02-20 MED ORDER — POTASSIUM CHLORIDE CRYS ER 20 MEQ PO TBCR: 20.0000 meq | EXTENDED_RELEASE_TABLET | Freq: Every day | ORAL | Status: DC | PRN

## 2021-02-20 MED ORDER — FUROSEMIDE 40 MG PO TABS: 40.0000 mg | ORAL_TABLET | Freq: Every day | ORAL | 11 refills | Status: DC | PRN

## 2021-02-20 MED ORDER — TETANUS-DIPHTH-ACELL PERTUSSIS 5-2.5-18.5 LF-MCG/0.5 IM SUSY
0.5000 mL | PREFILLED_SYRINGE | Freq: Once | INTRAMUSCULAR | Status: AC
  Administered 2021-02-20: 0.5 mL via INTRAMUSCULAR
  Filled 2021-02-20: qty 0.5

## 2021-02-20 NOTE — ED Triage Notes (Signed)
Pt had a trip and fall. Pt has a LAC to R eyebrow. Pt denies any LOC. Pt has a c-color on, but denies any neck pain.  Pt AAOx4.

## 2021-02-20 NOTE — Discharge Summary (Signed)
Physician Discharge Summary  Colleen Fleming KZL:[id number] DOB: May 16, 1956 DOA: 02/09/2021  PCP: Elisabeth Cara, [name]  Admit date: 02/09/2021 Discharge date: 02/20/2021  Admitted From: Home Disposition: Skilled nursing facility  Recommendations for Outpatient Follow-up:  1. Follow up with PCP in 1-2 weeks 2. Please obtain BMP/CBC/magnesium/phosphorus in one week 3. Heart failure clinic to follow-up.  Home Health: Not applicable Equipment/Devices: Not applicable  Discharge Condition: Stable CODE STATUS: Full code Diet recommendation: Low-salt, heart healthy diet  Discharge summary: 65 year old female with history of coronary artery disease recent PCI, chronic systolic heart failure presented with diarrhea and found to have severe hyponatremia with sodium 117.  She was also found to be positive for COVID-19.  Remains severely debilitated but clinically improved.  Going to a SNF for rehab.  Hyponatremia: This was felt due to be from hypovolemia in the setting of COVID-19 related gastroenteritis and diarrhea.  She was initially treated with supportive fluid, euvolemic.  Ultimately placed on fluid restrictions.  Sodium normalized.  Sodium 130 on 5/20.  She will need close follow-up.  Please recheck in 1 week.  Hypokalemia and diarrhea: Replaced aggressively and improved.  Diarrhea has improved.  COVID-19 infection: Patient had diarrhea.  Chest x-ray with no pneumonia.  No respiratory symptoms.  Due to medical complexity, she received 3 days of remdesivir therapy. Patient is 10 days out of diagnosis, no more isolation needed.  Standard precautions.  Hypotension: Blood pressure is usually low.  On supplemental midodrine.  Underwent right heart cath on 5/16-low filling pressures.  Blood pressures acceptable now.  Recent STEMI status post PCI: Patient without any active symptoms.  She is on dual antiplatelet therapy and a statin.  Followed by cardiology.  Chronic systolic heart failure  with known ejection fraction 25%: Followed by heart failure team.  Currently euvolemic. Patient is not tolerating beta-blockers, spironolactone or scheduled diuretic therapy. Plan is to discharge on midodrine. Lasix 40 mg for fluid overload along with potassium 20 mill equivalent on the day of Lasix. Ultimately after rehab, she will have follow-up at heart failure clinic and they may be able to add advanced heart failure therapies.  Patient remains chronically sick.  Overall stable to transfer to skilled level of care today.  No further airborne precautions needed.   Discharge Diagnoses:  Active Problems:   Hyponatremia    Discharge Instructions  Discharge Instructions    (HEART FAILURE PATIENTS) Call MD:  Anytime you have any of the following symptoms: 1) 3 pound weight gain in 24 hours or 5 pounds in 1 week 2) shortness of breath, with or without a dry hacking cough 3) swelling in the hands, feet or stomach 4) if you have to sleep on extra pillows at night in order to breathe.   Complete by: As directed    (HEART FAILURE PATIENTS) Call MD:  Anytime you have any of the following symptoms: 1) 3 pound weight gain in 24 hours or 5 pounds in 1 week 2) shortness of breath, with or without a dry hacking cough 3) swelling in the hands, feet or stomach 4) if you have to sleep on extra pillows at night in order to breathe.   Complete by: As directed    Call MD for:  difficulty breathing, headache or visual disturbances   Complete by: As directed    Diet - low sodium heart healthy   Complete by: As directed    Discharge instructions   Complete by: As directed    Follow with Primary  MD  [place], [place] E, [name] in 1-2 weeks  Please follow-up with the CHF clinic as instructed.  Please get a complete blood count and chemistry panel checked by your Primary MD at your next visit, and again as instructed by your Primary MD.  Get Medicines reviewed and adjusted: Please take all your  medications with you for your next visit with your Primary MD  Laboratory/radiological data: Please request your Primary MD to go over all hospital tests and procedure/radiological results at the follow up, please ask your Primary MD to get all Hospital records sent to his/her office.  In some cases, they will be blood work, cultures and biopsy results pending at the time of your discharge. Please request that your primary care M.D. follows up on these results.  Also Note the following: If you experience worsening of your admission symptoms, develop shortness of breath, life threatening emergency, suicidal or homicidal thoughts you must seek medical attention immediately by calling 911 or calling your MD immediately  if symptoms less severe.  You must read complete instructions/literature along with all the possible adverse reactions/side effects for all the Medicines you take and that have been prescribed to you. Take any new Medicines after you have completely understood and accpet all the possible adverse reactions/side effects.   Do not drive when taking Pain medications or sleeping medications (Benzodaizepines)  Do not take more than prescribed Pain, Sleep and Anxiety Medications. It is not advisable to combine anxiety,sleep and pain medications without talking with your primary care practitioner  Special Instructions: If you have smoked or chewed Tobacco  in the last 2 yrs please stop smoking, stop any regular Alcohol  and or any Recreational drug use.  Wear Seat belts while driving.  Please note: You were cared for by a hospitalist during your hospital stay. Once you are discharged, your primary care physician will handle any further medical issues. Please note that NO REFILLS for any discharge medications will be authorized once you are discharged, as it is imperative that you return to your primary care physician (or establish a relationship with a primary care physician if you do not  have one) for your post hospital discharge needs so that they can reassess your need for medications and monitor your lab values.   Fluid restriction of 1200 cc a day.   Discharge instructions   Complete by: As directed    No Covid isolation is needed   Increase activity slowly   Complete by: As directed    Increase activity slowly   Complete by: As directed      Allergies as of 02/20/2021      Reactions   Penicillins Hives      Medication List    STOP taking these medications   metoprolol succinate 25 MG 24 hr tablet Commonly known as: TOPROL-XL   spironolactone 25 MG tablet Commonly known as: ALDACTONE     TAKE these medications   Aspirin Low Dose 81 MG EC tablet Generic drug: aspirin Take 1 tablet (81 mg total) by mouth daily. Swallow whole.   atorvastatin 80 MG tablet Commonly known as: LIPITOR Take 1 tablet (80 mg total) by mouth daily.   Brilinta 90 MG Tabs tablet Generic drug: ticagrelor Take 1 tablet (90 mg total) by mouth 2 (two) times daily.   digoxin 0.125 MG tablet Commonly known as: LANOXIN Take 1 tablet (0.125 mg total) by mouth daily.   furosemide 40 MG tablet Commonly known as: Lasix Take 1 tablet (  40 mg total) by mouth daily as needed for fluid or edema (for weight gain more than 3 lbs in a day , leg edema).   midodrine 10 MG tablet Commonly known as: PROAMATINE Take 1 tablet (10 mg total) by mouth 3 (three) times daily with meals.   nitroGLYCERIN 0.4 MG SL tablet Commonly known as: NITROSTAT Place 1 tablet (0.4 mg total) under the tongue every 5 (five) minutes x 3 doses as needed for chest pain.   potassium chloride SA 20 MEQ tablet Commonly known as: KLOR-CON Take 1 tablet (20 mEq total) by mouth daily as needed (when given lasix).       Contact information for follow-up providers    [place], [place], [place]. Schedule an appointment as soon as possible for a visit in 1 week(s).   Specialty: Family Medicine Contact information: [address] [place] [address] [phone]        Leonie Man, MD Follow up in 1 month(s).   Specialty: Cardiology Contact information: [address] [place] [address] [place] [address] [phone]        [place] Follow up in 1 day(s).   Specialty: Cardiology Why: June 7, 12:00 PM  [facility], Entrance C Parking Garage Code 4233 Contact information: [address] [id number] [place] [phone]           Contact information for after-discharge care    Destination    [facility] .   Service: Skilled Nursing Contact information: [place] [phone]                 Allergies  Allergen Reactions  . Penicillins Hives    Consultations:  Cardiology   Procedures/Studies: DG Chest 2 View  Result Date: 02/01/2021 CLINICAL DATA:  History of lung cancer EXAM: CHEST - 2 VIEW COMPARISON:  Radiograph 11/25/2011 FINDINGS: Right IJ central venous catheter tip terminates at the right atrium. Telemetry leads overlie the chest. Chronically coarsened interstitial changes and bronchitic features. Bandlike opacity is seen in the left lung apex with some tenting of the left hemidiaphragm, could reflect subsegmental atelectasis or volume loss though underlying airspace disease or architectural distortion could have a similar appearance. No focal consolidative opacity is seen. The cardiomediastinal contours are unremarkable. Few coronary stents are noted. Age-indeterminate left sixth and seventh posterolateral left rib fractures. No other acute osseous or soft tissue abnormalities the chest wall. IMPRESSION: Chronically coarsened interstitial and bronchitic features. New bandlike opacity in left lung apex with tenting of the left hemidiaphragm. Could reflect scarring or atelectatic  change with volume loss. Underlying airspace disease or mass is less favored though not fully excluded. Right IJ approach central venous catheter at the right atrium Posterolateral left sixth and seventh rib fractures, age indeterminate, correlate for point tenderness. Electronically Signed   By: Lovena Le M.D.   On: 02/01/2021 04:25   CARDIAC CATHETERIZATION  Result Date: 02/15/2021 Findings: RA = 2 RV = 17/2 PA = 17/4 (8) PCW = 4 Fick cardiac output/index = 3.0/2.4 Thermo CO/CI = 2.8/2.2 PVR = 1.3 Ao sat = 98% PA sat = 64%, 65% Assessment: 1. Low filling pressures with moderately reduced cardiac output Plan/Discussion: Hydrate gently. No role for inotropic support at this time. Glori Bickers, MD 2:24 PM   CARDIAC CATHETERIZATION  Result Date: 01/30/2021  The left ventricular ejection fraction is 45-50%  by visual estimate. Mid to apical anterior hypokinesis.  LV end diastolic pressure is mildly elevated at 13 mmHg, with systolic 98 mmHg.  There is trivial (1+) mitral regurgitation.  --------------------------------------------  Culprit lesion: Prox LAD lesion is 99% stenosed. (Calcified, thrombotic)  A drug-eluting stent was successfully placed using a SYNERGY XD 2.50X16 -> postdilated 2.8 mm  Post intervention, there is a 0% residual stenosis.  Dist LAD lesion is 80% stenosed -> there is TIMI 0 flow to the apex, post PCI TIMI II flow, but still notable irregularities distally, likely distal embolism.  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  Distal LAD has TIMI I 0 flow.  Successful DES PCI of the LAD using a synergy DES 2.5 mm x 16 mm postdilated to 2.8 mm.  Otherwise angiographically normal coronary arteries with exception of the apical LAD.  Mild mid to apical anterior hypokinesis on LV gram with mildly reduced EF of roughly 45%.  Upper limit of normal LVEDP.  Borderline hypotension with systolic pressures  ranging in the 95 to 105 mmHg range -> no evidence of shock  Severe hypOkalemia, i-STAT potassium read is 2.1, labs from ER read is 2.7.  Resolution of ventricular bigeminy. RECOMMENDATION  Admit to CVICU  Will run 3 rounds of IV potassium and recheck in 3 hours.  Continue IV Aggrastat for another 1 hour post PCI.  Check 2D echocardiogram, A1c and lipid panel the morning.  High-dose high intensity statin  Will attempt to start low-dose beta-blocker given tachycardia, however with borderline pressures, may need to hold.  Cardiac rehab consult  Smoking cessation consult Glenetta Hew, MD  DG Chest [facility] 1 View  Result Date: 02/09/2021 CLINICAL DATA:  Weakness, history of recent STEMI EXAM: PORTABLE CHEST 1 VIEW COMPARISON:  01/31/2021 FINDINGS: Cardiac shadow is at the upper limits of normal in size. Prior right jugular central line has been removed in the interval. The lungs are well aerated without focal infiltrate or sizable effusion. Chronic band like density is noted in the left upper lobe medially consistent with prior radiation therapy. This is stable from a prior CT from 12/23/2020 IMPRESSION: No acute abnormality noted. Prior radiation change in the medial left upper lobe. Electronically Signed   By: Inez Catalina M.D.   On: 02/09/2021 23:34   ECHOCARDIOGRAM COMPLETE  Result Date: 01/31/2021    ECHOCARDIOGRAM REPORT   Patient Name:   Colleen Fleming Date of Exam: 01/31/2021 Medical Rec #:  [id number]         Height:       58.0 in Accession #:    [id number]        Weight:       100.1 lb Date of Birth:  September 25, 1956         BSA:          1.357 m Patient Age:    28 years          BP:           133/91 mmHg Patient Gender: F                 HR:           73 bpm. Exam Location:  Inpatient Procedure: 2D Echo, Cardiac Doppler, Color Doppler and 3D Echo Indications:    Acute myocardial infarction  History:        Patient has no prior history of Echocardiogram examinations.  Acute MI and CAD;  Risk Factors:Current Smoker. GERD.  Sonographer:    Clayton Lefort RDCS (AE) Referring Phys: [place]  1. No left ventricular thrombus. Left ventricular ejection fraction, by estimation, is 25 to 30%. The left ventricle has severely decreased function. The left ventricle demonstrates regional wall motion abnormalities (see scoring diagram/findings for description). Left ventricular diastolic parameters are consistent with Grade II diastolic dysfunction (pseudonormalization). Elevated left atrial pressure. There is severe hypokinesis of the left ventricular, entire anteroseptal wall and anterior wall. There is mild dyskinesis of the left ventricular, apical anterior wall and apical segment.  2. Right ventricular systolic function is normal. The right ventricular size is normal. There is mildly elevated pulmonary artery systolic pressure.  3. A small pericardial effusion is present. The pericardial effusion is circumferential.  4. The mitral valve is normal in structure. No evidence of mitral valve regurgitation.  5. The aortic valve is normal in structure. Aortic valve regurgitation is mild.  6. The inferior vena cava is normal in size with <50% respiratory variability, suggesting right atrial pressure of 8 mmHg. FINDINGS  Left Ventricle: No left ventricular thrombus. Left ventricular ejection fraction, by estimation, is 25 to 30%. The left ventricle has severely decreased function. The left ventricle demonstrates regional wall motion abnormalities. Severe hypokinesis of the left ventricular, entire anteroseptal wall and anterior wall. Mild dyskinesis of the left ventricular, apical anterior wall and apical segment. 3D left ventricular ejection fraction analysis performed but not reported based on interpreter judgement due to suboptimal quality. The left ventricular internal cavity size was normal in size. There is no left ventricular hypertrophy. Left ventricular diastolic parameters are  consistent with Grade II diastolic dysfunction (pseudonormalization). Elevated left atrial pressure.  LV Wall Scoring: The apical anterior segment and apex are dyskinetic. The anterior wall, entire anterior septum, apical lateral segment, mid inferoseptal segment, and apical inferior segment are hypokinetic. The antero-lateral wall, inferior wall, posterior wall, and basal inferoseptal segment are normal. Right Ventricle: The right ventricular size is normal. No increase in right ventricular wall thickness. Right ventricular systolic function is normal. There is mildly elevated pulmonary artery systolic pressure. The tricuspid regurgitant velocity is 2.63  m/s, and with an assumed right atrial pressure of 8 mmHg, the estimated right ventricular systolic pressure is 13.2 mmHg. Left Atrium: Left atrial size was normal in size. Right Atrium: Right atrial size was normal in size. Pericardium: A small pericardial effusion is present. The pericardial effusion is circumferential. Mitral Valve: The mitral valve is normal in structure. No evidence of mitral valve regurgitation. MV peak gradient, 2.7 mmHg. The mean mitral valve gradient is 1.0 mmHg. Tricuspid Valve: The tricuspid valve is normal in structure. Tricuspid valve regurgitation is mild. Aortic Valve: The aortic valve is normal in structure. Aortic valve regurgitation is mild. Aortic valve mean gradient measures 2.0 mmHg. Aortic valve peak gradient measures 3.3 mmHg. Aortic valve area, by VTI measures 1.59 cm. Pulmonic Valve: The pulmonic valve was normal in structure. Pulmonic valve regurgitation is trivial. Aorta: The aortic root is normal in size and structure. Venous: The inferior vena cava is normal in size with less than 50% respiratory variability, suggesting right atrial pressure of 8 mmHg. IAS/Shunts: No atrial level shunt detected by color flow Doppler. Additional Comments: A venous catheter is visualized in the right atrium.  LEFT VENTRICLE PLAX 2D  LVIDd:         3.80 cm  Diastology LVIDs:  2.30 cm  LV e' medial:    4.46 cm/s LV PW:         1.10 cm  LV E/e' medial:  11.5 LV IVS:        0.90 cm  LV e' lateral:   5.44 cm/s LVOT diam:     1.70 cm  LV E/e' lateral: 9.4 LV SV:         28 LV SV Index:   21 LVOT Area:     2.27 cm                          3D Volume EF:                         3D EF:        36 %                         LV EDV:       97 ml                         LV ESV:       62 ml                         LV SV:        35 ml RIGHT VENTRICLE            IVC RV Basal diam:  2.50 cm    IVC diam: 1.80 cm RV S prime:     9.79 cm/s TAPSE (M-mode): 1.2 cm LEFT ATRIUM             Index       RIGHT ATRIUM          Index LA diam:        1.90 cm 1.40 cm/m  RA Area:     7.28 cm LA Vol (A2C):   23.0 ml 16.95 ml/m RA Volume:   13.40 ml 9.88 ml/m LA Vol (A4C):   23.2 ml 17.10 ml/m LA Biplane Vol: 24.4 ml 17.98 ml/m  AORTIC VALVE AV Area (Vmax):    1.65 cm AV Area (Vmean):   1.60 cm AV Area (VTI):     1.59 cm AV Vmax:           91.00 cm/s AV Vmean:          64.500 cm/s AV VTI:            0.176 m AV Peak Grad:      3.3 mmHg AV Mean Grad:      2.0 mmHg LVOT Vmax:         66.20 cm/s LVOT Vmean:        45.600 cm/s LVOT VTI:          0.123 m LVOT/AV VTI ratio: 0.70  AORTA Ao Root diam: 2.90 cm Ao Asc diam:  3.10 cm MITRAL VALVE               TRICUSPID VALVE MV Area (PHT): 3.77 cm    TR Peak grad:   27.7 mmHg MV Area VTI:   1.81 cm    TR Vmax:        263.00 cm/s MV Peak grad:  2.7 mmHg MV Mean grad:  1.0 mmHg    SHUNTS MV Vmax:  0.81 m/s    Systemic VTI:  0.12 m MV Vmean:      49.0 cm/s   Systemic Diam: 1.70 cm MV Decel Time: 201 msec MV E velocity: 51.40 cm/s MV A velocity: 41.60 cm/s MV E/A ratio:  1.24 Mihai Croitoru MD Electronically signed by Sanda Klein MD Signature Date/Time: 01/31/2021/12:09:51 PM    Final    ECHOCARDIOGRAM LIMITED  Result Date: 02/13/2021    ECHOCARDIOGRAM LIMITED REPORT   Patient Name:   Colleen Fleming Date of Exam:  02/13/2021 Medical Rec #:  [id number]     Height:       57.0 in Accession #:    [id number]    Weight:       95.2 lb Date of Birth:  Feb 22, 1956     BSA:          1.312 m Patient Age:    62 years      BP:           114/72 mmHg Patient Gender: F             HR:           75 bpm. Exam Location:  Inpatient Procedure: 2D Echo, Cardiac Doppler and Color Doppler Indications:    CHF-Acute Systolic  History:        Patient has prior history of Echocardiogram examinations, most                 recent 02/01/2021. Previous Myocardial Infarction and CAD; Risk                 Factors:Dyslipidemia and Current Smoker. GERD.  Sonographer:    Clayton Lefort RDCS (AE) Referring Phys: [place]  1. Akinesis of the anteroseptal, apical and distal inferior walls with overall severe LV dysfunction; large, predominantly anterior pericardial effusion; respiratory flow variation across mitral valve and RA collapse suggestive of early tamponade physiology but no RV diastolic collapse and IVC not dilated and collapses. Compared to 02/01/21, pericardial effusion is larger.  2. Left ventricular ejection fraction, by estimation, is 20 to 25%. The left ventricle has severely decreased function. The left ventricle demonstrates regional wall motion abnormalities (see scoring diagram/findings for description). There is moderate left ventricular hypertrophy of the basal-septal segment.  3. Right ventricular systolic function is normal. The right ventricular size is normal.  4. Large pericardial effusion.  5. The mitral valve is normal in structure. No evidence of mitral valve regurgitation. No evidence of mitral stenosis.  6. The aortic valve is tricuspid. Aortic valve regurgitation is not visualized. No aortic stenosis is present.  7. The inferior vena cava is normal in size with greater than 50% respiratory variability, suggesting right atrial pressure of 3 mmHg. FINDINGS  Left Ventricle: Left ventricular ejection fraction, by  estimation, is 20 to 25%. The left ventricle has severely decreased function. The left ventricle demonstrates regional wall motion abnormalities. The left ventricular internal cavity size was normal  in size. There is moderate left ventricular hypertrophy of the basal-septal segment. Right Ventricle: The right ventricular size is normal.Right ventricular systolic function is normal. Left Atrium: Left atrial size was normal in size. Right Atrium: Right atrial size was normal in size. Pericardium: A large pericardial effusion is present. Mitral Valve: The mitral valve is normal in structure. No evidence of mitral valve stenosis. Tricuspid Valve: The tricuspid valve is normal in structure. Tricuspid valve regurgitation is trivial. No evidence of tricuspid stenosis. Aortic Valve: The aortic valve  is tricuspid. Aortic valve regurgitation is not visualized. No aortic stenosis is present. Pulmonic Valve: The pulmonic valve was not well visualized. No evidence of pulmonic stenosis. Aorta: The aortic root is normal in size and structure. Venous: The inferior vena cava is normal in size with greater than 50% respiratory variability, suggesting right atrial pressure of 3 mmHg.  Additional Comments: Akinesis of the anteroseptal, apical and distal inferior walls with overall severe LV dysfunction; large, predominantly anterior pericardial effusion; respiratory flow variation across mitral valve and RA collapse suggestive of early  tamponade physiology but no RV diastolic collapse and IVC not dilated and collapses. Compared to 02/01/21, pericardial effusion is larger. LEFT VENTRICLE PLAX 2D LVIDd:         3.50 cm LVIDs:         2.70 cm LV PW:         1.10 cm LV IVS:        1.40 cm LVOT diam:     1.80 cm LVOT Area:     2.54 cm  LV Volumes (MOD) LV vol d, MOD A2C: 54.7 ml LV vol d, MOD A4C: 54.4 ml LV vol s, MOD A2C: 38.2 ml LV vol s, MOD A4C: 42.6 ml LV SV MOD A2C:     16.5 ml LV SV MOD A4C:     54.4 ml LV SV MOD BP:      15.4 ml  IVC IVC diam: 1.50 cm LEFT ATRIUM         Index LA diam:    3.00 cm 2.29 cm/m   AORTA Ao Root diam: 3.10 cm Ao Asc diam:  2.80 cm  SHUNTS Systemic Diam: 1.80 cm Kirk Ruths MD Electronically signed by Kirk Ruths MD Signature Date/Time: 02/13/2021/3:22:45 PM    Final    ECHOCARDIOGRAM LIMITED  Result Date: 02/01/2021    ECHOCARDIOGRAM LIMITED REPORT   Patient Name:   Colleen Fleming Date of Exam: 02/01/2021 Medical Rec #:  [id number]     Height:       57.0 in Accession #:    [id number]    Weight:       97.0 lb Date of Birth:  [birth year]     BSA:          1.322 m Patient Age:    27 years      BP:           94/64 mmHg Patient Gender: F             HR:           92 bpm. Exam Location:  Inpatient Procedure: Limited Color Doppler, Cardiac Doppler, Color Doppler and Limited            Echo STAT ECHO Indications:    Pericardial effusion with hypotension. Assess for tamponade  History:        Patient has prior history of Echocardiogram examinations, most                 recent 01/31/2021. CAD and Previous Myocardial Infarction,                 Abnormal ECG and Left heart cath; Signs/Symptoms:Hypotension.                 Frequent falls with broken ribs. Lung cancer.  Sonographer:    Merrie Roof Referring Phys: [place] Comments: This was a limited echo to rule out tamponade. IMPRESSIONS  1. Left ventricular ejection fraction,  by estimation, is 25%. The left ventricle has severely decreased function. The left ventricle demonstrates regional wall motion abnormalities with mid to apical anteroseptal/inferoseptal/anterior akinesis, apical lateral akinesis, and akinesis of the true apex. Consistent with large LAD territory MI. There is mild left ventricular hypertrophy.  2. Right ventricular systolic function is normal. The right ventricular size is normal.  3. Small to moderate pericardial effusion primarily adjacent to the RV. The IVC collapses around 50% with respiration. Difficult to interpret  respirometry due to frequent ectopy. RV diastolic collapse is not present. No overt pericardial tamponade. Pericardial effusion is similar to the prior study.  4. The inferior vena cava is normal in size with <50% respiratory variability, suggesting right atrial pressure of 8 mmHg.  5. Limited echo FINDINGS  Left Ventricle: Left ventricular ejection fraction, by estimation, is 25%. The left ventricle has severely decreased function. The left ventricle demonstrates regional wall motion abnormalities. The left ventricular internal cavity size was normal in size. There is mild left ventricular hypertrophy. Right Ventricle: The right ventricular size is normal. No increase in right ventricular wall thickness. Right ventricular systolic function is normal. Left Atrium: Left atrial size was normal in size. Right Atrium: Right atrial size was normal in size. Pericardium: Small to moderate pericardial effusion primarily adjacent to the RV. The IVC collapses around 50% with respiration. Difficult to interpret respirometry due to frequent ectopy. RV diastolic collapse is not present. No overt pericardial tamponade. Venous: The inferior vena cava is normal in size with less than 50% respiratory variability, suggesting right atrial pressure of 8 mmHg. IAS/Shunts: No atrial level shunt detected by color flow Doppler. IVC IVC diam: 2.00 cm Loralie Champagne MD Electronically signed by Loralie Champagne MD Signature Date/Time: 02/01/2021/4:32:21 PM    Final    [place] EKG SITE RITE  Result Date: 02/12/2021 If Site Rite image not attached, placement could not be confirmed due to current cardiac rhythm.  [place] EKG SITE RITE  Result Date: 02/12/2021 If Site Rite image not attached, placement could not be confirmed due to current cardiac rhythm.   (Echo, Carotid, EGD, Colonoscopy, ERCP)    Subjective: Patient seen and examined.  No overnight events.  Denies any complaints today.  Feels weak otherwise stable. Afebrile.  Denies any  shortness of breath.   Discharge Exam: Vitals:   02/20/21 0945 02/20/21 1015  BP:  92/69  Pulse: (!) 108   Resp: (!) 21 (!) 21  Temp:  97.7 F (36.5 C)  SpO2: 99% 96%   Vitals:   02/20/21 0100 02/20/21 0743 02/20/21 0945 02/20/21 1015  BP: 103/62 95/70  92/69  Pulse: 99 98 (!) 108   Resp: (!) 24 (!) 24 (!) 21 (!) 21  Temp: 98.3 F (36.8 C) 98.1 F (36.7 C)  97.7 F (36.5 C)  TempSrc: Oral Oral  Oral  SpO2: 97% 96% 99% 96%  Weight:        General: Pt is alert, awake, not in acute distress Frail and debilitated.  Chronically sick looking.  Not in any distress.  On room air. Cardiovascular: RRR, S1/S2 +, no rubs, no gallops Respiratory: CTA bilaterally, no wheezing, no rhonchi Abdominal: Soft, NT, ND, bowel sounds + Extremities: no edema, no cyanosis    The results of significant diagnostics from this hospitalization (including imaging, microbiology, ancillary and laboratory) are listed below for reference.     Microbiology: No results found for this or any previous visit (from the past 240 hour(s)).   Labs: BNP (last 3  results) Recent Labs    02/01/21 1100  BNP 203.5*   Basic Metabolic Panel: Recent Labs  Lab 02/14/21 0550 02/15/21 0650 02/15/21 1353 02/15/21 1354 02/16/21 0352 02/17/21 0703 02/19/21 0457  NA 126* 128* 130* 129* 129* 131* 130*  K 3.7 3.5 3.3* 3.4* 3.3* 4.2 4.0  CL 97* 98  --   --  98 97* 96*  CO2 21* 22  --   --  22 27 27   GLUCOSE 96 95  --   --  84 97 94  BUN 8 7*  --   --  7* 9 11  CREATININE 0.66 0.68  --   --  0.74 0.78 0.64  CALCIUM 8.3* 8.4*  --   --  8.4* 8.6* 8.6*   Liver Function Tests: Recent Labs  Lab 02/14/21 0550 02/15/21 0650  AST 25 29  ALT 20 22  ALKPHOS 78 84  BILITOT 0.8 0.9  PROT 5.6* 5.6*  ALBUMIN 2.9* 2.9*   No results for input(s): LIPASE, AMYLASE in the last 168 hours. No results for input(s): AMMONIA in the last 168 hours. CBC: Recent Labs  Lab 02/15/21 1353 02/15/21 1354  HGB 11.6* 11.6*   HCT 34.0* 34.0*   Cardiac Enzymes: No results for input(s): CKTOTAL, CKMB, CKMBINDEX, TROPONINI in the last 168 hours. BNP: Invalid input(s): POCBNP CBG: No results for input(s): GLUCAP in the last 168 hours. D-Dimer No results for input(s): DDIMER in the last 72 hours. Hgb A1c No results for input(s): HGBA1C in the last 72 hours. Lipid Profile No results for input(s): CHOL, HDL, LDLCALC, TRIG, CHOLHDL, LDLDIRECT in the last 72 hours. Thyroid function studies No results for input(s): TSH, T4TOTAL, T3FREE, THYROIDAB in the last 72 hours.  Invalid input(s): FREET3 Anemia work up No results for input(s): VITAMINB12, FOLATE, FERRITIN, TIBC, IRON, RETICCTPCT in the last 72 hours. Urinalysis    Component Value Date/Time   COLORURINE YELLOW 02/10/2021 0316   APPEARANCEUR HAZY (A) 02/10/2021 0316   LABSPEC 1.004 (L) 02/10/2021 0316   PHURINE 6.0 02/10/2021 0316   GLUCOSEU NEGATIVE 02/10/2021 0316   HGBUR SMALL (A) 02/10/2021 0316   BILIRUBINUR NEGATIVE 02/10/2021 0316   KETONESUR NEGATIVE 02/10/2021 0316   PROTEINUR NEGATIVE 02/10/2021 0316   NITRITE NEGATIVE 02/10/2021 0316   LEUKOCYTESUR NEGATIVE 02/10/2021 0316   Sepsis Labs Invalid input(s): PROCALCITONIN,  WBC,  LACTICIDVEN Microbiology No results found for this or any previous visit (from the past 240 hour(s)).   Time coordinating discharge: 35 minutes  SIGNED:   Barb Merino, MD  [facility] 02/20/2021, 10:22 AM

## 2021-02-20 NOTE — Progress Notes (Signed)
RN attempted to call report to [employer]. Will re-attempt.

## 2021-02-20 NOTE — ED Provider Notes (Signed)
[place] EMERGENCY DEPARTMENT Provider Note   CSN: [id number] Arrival date & time: 02/20/21  1619    History Chief Complaint  Patient presents with  . Fall    Pt had a trip and fall. Denies any LOC. Pt has LAC to R eyebrow    Colleen Fleming is a 65 y.o. female with past medical history significant for recent NSTEMI 1 month ago, COPD, recent discharged today for COVID, hyponatremia who presents for evaluation after mechanical fall.  Was at her SNF facility.  Patient states she was in a wheelchair and attempted to bend forward and suffered a laceration to her right head.  She denies any complaints aside from a mild headache on the right side of her head.  She denies any vision changes, neck pain, neck stiffness, chest pain, shortness breath, abdominal pain, diarrhea, dysuria.  She feels generally weak which she states has been chronic in nature.  Denies additional aggravating or alleviating factors.  History obtained from patient and past medical records.  No interpreter used.  HPI     Past Medical History:  Diagnosis Date  . Anxiety and depression   . COPD (chronic obstructive pulmonary disease) (HCC)    Long-term smoker greater than 35 years 1 to 2 pack a day.  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 GERD (gastroesophageal reflux disease)   . History of seizure disorder   . Hyperlipidemia   . Obesity   . Small cell lung cancer, left upper lobe ([place]) 2014   Treated with radiation and chemotherapy [facility])    Patient Active Problem List   Diagnosis Date Noted  . Hyponatremia 02/10/2021  . STEMI (ST elevation myocardial infarction) ([place]) 01/30/2021  . Acute ST elevation myocardial infarction (STEMI) involving left anterior descending (LAD) coronary artery ([place])   . Hypokalemia   . Panlobular emphysema ([place])   . Coronary artery disease involving native coronary artery of native heart with unstable angina pectoris  ([place])   . GERD (gastroesophageal reflux disease) 11/25/2011  . Nicotine abuse 11/25/2011  . Hyperlipemia 11/25/2011  . IBS (irritable bowel syndrome) 11/25/2011  . Anxiety as acute reaction to exceptional stress 11/25/2011    Past Surgical History:  Procedure Laterality Date  . CORONARY/GRAFT ACUTE MI REVASCULARIZATION N/A 01/30/2021   Procedure: Coronary/Graft Acute MI Revascularization;  Surgeon: Leonie Man, MD;  Location: [place] CV LAB;  Service: Cardiovascular;  Laterality: N/A;  . LEFT HEART CATH AND CORONARY ANGIOGRAPHY N/A 01/30/2021   Procedure: LEFT HEART CATH AND CORONARY ANGIOGRAPHY;  Surgeon: Leonie Man, MD;  Location: [place] CV LAB;  Service: Cardiovascular;  Laterality: N/A;  . RIGHT HEART CATH N/A 02/15/2021   Procedure: RIGHT HEART CATH;  Surgeon: Jolaine Artist, MD;  Location: [place] CV LAB;  Service: Cardiovascular;  Laterality: N/A;     OB History   No obstetric history on file.     Family History  Problem Relation Age of Onset  . Cancer Paternal Grandmother   . Cancer Paternal Aunt     Social History   Tobacco Use  . Smoking status: Current Every Day Smoker    Packs/day: 1.50    Years: 40.00    Pack years: 60.00  . Smokeless tobacco: Never Used  Substance Use Topics  . Alcohol use: Not Currently  . Drug use: Never    Home Medications Prior to Admission medications   Medication Sig Start Date  End Date Taking? Authorizing Provider  aspirin 81 MG EC tablet Take 1 tablet (81 mg total) by mouth daily. Swallow whole. 02/05/21   Lyda Jester M, [name]  atorvastatin (LIPITOR) 80 MG tablet Take 1 tablet (80 mg total) by mouth daily. 02/05/21   Lyda Jester M, [name]  digoxin (LANOXIN) 0.125 MG tablet Take 1 tablet (0.125 mg total) by mouth daily. 02/16/21   Ghimire, Henreitta Leber, MD  furosemide (LASIX) 40 MG tablet Take 1 tablet (40 mg total) by mouth daily as needed for fluid or edema (for weight gain more than 3 lbs in a day ,  leg edema). 02/20/21 02/20/22  Barb Merino, MD  midodrine (PROAMATINE) 10 MG tablet Take 1 tablet (10 mg total) by mouth 3 (three) times daily with meals. 02/16/21   Ghimire, Henreitta Leber, MD  nitroGLYCERIN (NITROSTAT) 0.4 MG SL tablet Place 1 tablet (0.4 mg total) under the tongue every 5 (five) minutes x 3 doses as needed for chest pain. 02/04/21   Lyda Jester M, [name]  potassium chloride SA (KLOR-CON) 20 MEQ tablet Take 1 tablet (20 mEq total) by mouth daily as needed (when given lasix). 02/20/21   Barb Merino, MD  ticagrelor (BRILINTA) 90 MG TABS tablet Take 1 tablet (90 mg total) by mouth 2 (two) times daily. 02/04/21   Consuelo Pandy, [name]    Allergies    Penicillins  Review of Systems   Review of Systems  Constitutional: Positive for activity change (Chronic).  HENT: Negative.   Respiratory: Negative.   Cardiovascular: Negative.   Gastrointestinal: Negative.   Genitourinary: Negative.   Musculoskeletal: Negative.   Skin: Positive for wound.  Neurological: Negative.   All other systems reviewed and are negative.   Physical Exam Updated Vital Signs BP (!) 139/106   Pulse 89   Temp 97.6 F (36.4 C) (Oral)   Resp 19   SpO2 100%   Physical Exam Vitals and nursing note reviewed.  Constitutional:      General: She is not in acute distress.    Appearance: She is well-developed. She is ill-appearing (Chronically ill appearing). She is not toxic-appearing or diaphoretic.  HENT:     Head: Normocephalic and atraumatic.     Nose: Nose normal.     Mouth/Throat:     Mouth: Mucous membranes are dry.     Comments: Poor dentition Eyes:     Pupils: Pupils are equal, round, and reactive to light.  Cardiovascular:     Rate and Rhythm: Normal rate.     Pulses: Normal pulses.     Heart sounds: Normal heart sounds.  Pulmonary:     Effort: Pulmonary effort is normal. No respiratory distress.     Breath sounds: Normal breath sounds.  Abdominal:     General: Bowel sounds are  normal. There is no distension.     Tenderness: There is no abdominal tenderness. There is no guarding or rebound.  Musculoskeletal:        General: No swelling, tenderness, deformity or signs of injury. Normal range of motion.     Cervical back: Normal range of motion.     Comments: Moves all 4 extremities out difficulty.  No bony tenderness. No shortening or rotation of legs.  Able to lift bilateral arms above head.  Able to flex and extend bilateral knees, hips.  Wiggles toes without difficulty  Skin:    General: Skin is warm and dry.     Capillary Refill: Capillary refill takes less than 2 seconds.  Comments: Old ecchymosis to lower abdominal wall which appears to be consistent with anticoagulant shots.  She has 1 cm laceration to right eyebrow.  No active bleeding or draining  Neurological:     General: No focal deficit present.     Mental Status: She is alert and oriented to person, place, and time.     Comments: Cranial nerves II through XII grossly intact Intact to person, place and time Equal handgrip bilaterally     ED Results / Procedures / Treatments   Labs (all labs ordered are listed, but only abnormal results are displayed) Labs Reviewed  CBC WITH DIFFERENTIAL/PLATELET - Abnormal; Notable for the following components:      Result Value   RBC 3.68 (*)    Hemoglobin 11.8 (*)    HCT 35.1 (*)    Monocytes Absolute 1.1 (*)    All other components within normal limits  BASIC METABOLIC PANEL - Abnormal; Notable for the following components:   Sodium 132 (*)    Calcium 8.6 (*)    All other components within normal limits  TROPONIN I (HIGH SENSITIVITY) - Abnormal; Notable for the following components:   Troponin I (High Sensitivity) 66 (*)    All other components within normal limits  TROPONIN I (HIGH SENSITIVITY) - Abnormal; Notable for the following components:   Troponin I (High Sensitivity) 77 (*)    All other components within normal limits  DIGOXIN LEVEL   PROTIME-INR    EKG None  Radiology DG Chest 2 View  Result Date: 02/20/2021 CLINICAL DATA:  Fall EXAM: CHEST - 2 VIEW COMPARISON:  02/09/2021 chest radiograph. FINDINGS: Stable cardiomediastinal silhouette with top-normal heart size. No pneumothorax. No pleural effusion. Sharply marginated medial upper left lung consolidation, unchanged. Stable mild curvilinear left basilar scarring. Otherwise clear lungs, with no pulmonary edema. No displaced fractures in the visualized chest. Scattered chronic thoracic vertebral compression fractures are unchanged since 01/31/2021 radiograph. IMPRESSION: 1. No acute cardiopulmonary disease. 2. Chronic sharply marginated medial left upper lung consolidation compatible with prior radiation therapy. Electronically Signed   By: Ilona Sorrel M.D.   On: 02/20/2021 17:26   DG Pelvis 1-2 Views  Result Date: 02/20/2021 CLINICAL DATA:  Fall EXAM: PELVIS - 1-2 VIEW COMPARISON:  None. FINDINGS: No pelvic fracture or diastasis. No evidence of hip dislocation on this frontal view. No suspicious focal osseous lesions. IMPRESSION: No pelvic fracture. Electronically Signed   By: Ilona Sorrel M.D.   On: 02/20/2021 17:28   CT Head Wo Contrast  Result Date: 02/20/2021 CLINICAL DATA:  Status post fall. EXAM: CT HEAD WITHOUT CONTRAST TECHNIQUE: Contiguous axial images were obtained from the base of the skull through the vertex without intravenous contrast. COMPARISON:  MR head, dated Feb 03, 2017 FINDINGS: Brain: There is mild to moderate severity cerebral atrophy with widening of the extra-axial spaces and ventricular dilatation. There are areas of decreased attenuation within the white matter tracts of the supratentorial brain, consistent with microvascular disease changes. Chronic bilateral basal ganglia lacunar infarcts are noted. Vascular: No hyperdense vessel or unexpected calcification. Skull: Normal. Negative for fracture or focal lesion. Sinuses/Orbits: A 2.1 cm x 1.4 cm  anterior left maxillary sinus polyp versus mucous retention cyst is seen with mild posterior left maxillary sinus mucosal thickening. Other: Mild right periorbital soft tissue swelling is present. IMPRESSION: 1. Generalized cerebral atrophy. 2. Mild right periorbital soft tissue swelling without acute fracture or acute intracranial abnormality. Electronically Signed   By: Joyce Gross.D.  On: 02/20/2021 17:12   CT Cervical Spine Wo Contrast  Result Date: 02/20/2021 CLINICAL DATA:  Fall.  Head and trauma.  Initial encounter. EXAM: CT CERVICAL SPINE WITHOUT CONTRAST TECHNIQUE: Multidetector CT imaging of the cervical spine was performed without intravenous contrast. Multiplanar CT image reconstructions were also generated. COMPARISON:  None. FINDINGS: Alignment: Normal. Skull base and vertebrae: No acute fracture. No primary bone lesion or focal pathologic process. Soft tissues and spinal canal: No prevertebral fluid or swelling. No visible canal hematoma. Disc levels: No disc space narrowing. Mild-to-moderate facet DJD is seen on the left from levels of C3-C6. Upper chest: No acute findings.  Scarring noted in left lung apex. Other: None. IMPRESSION: No evidence of cervical spine fracture or subluxation. Mild-to-moderate left facet DJD. Electronically Signed   By: Marlaine Hind M.D.   On: 02/20/2021 17:13    Procedures .Marland KitchenLaceration Repair  Date/Time: 02/20/2021 9:02 PM Performed by: Shelby Dubin A, [name] Authorized by: Nettie Elm, [name]   Consent:    Consent obtained:  Verbal   Consent given by:  Patient   Risks discussed:  Infection, need for additional repair, pain, poor cosmetic result and poor wound healing   Alternatives discussed:  No treatment and delayed treatment Universal protocol:    Procedure explained and questions answered to patient or proxy's satisfaction: yes     Relevant documents present and verified: yes     Test results available: yes     Imaging studies  available: yes     Required blood products, implants, devices, and special equipment available: yes     Site/side marked: yes     Immediately prior to procedure, a time out was called: yes     Patient identity confirmed:  Verbally with patient Anesthesia:    Anesthesia method:  Local infiltration   Local anesthetic:  Lidocaine 1% WITH epi Laceration details:    Location:  Face   Face location:  R eyebrow   Length (cm):  1 Pre-procedure details:    Preparation:  Patient was prepped and draped in usual sterile fashion and imaging obtained to evaluate for foreign bodies Exploration:    Hemostasis achieved with:  Direct pressure   Imaging obtained comment:  CT head   Wound extent: no foreign bodies/material noted, no muscle damage noted, no tendon damage noted, no underlying fracture noted and no vascular damage noted   Treatment:    Area cleansed with:  Povidone-iodine   Amount of cleaning:  Standard Skin repair:    Repair method:  Sutures   Suture size:  5-0   Suture material:  Prolene   Suture technique:  Simple interrupted   Number of sutures:  2 Approximation:    Approximation:  Close Repair type:    Repair type:  Intermediate Post-procedure details:    Dressing:  Open (no dressing)   Procedure completion:  Tolerated well, no immediate complications     Medications Ordered in ED Medications  lidocaine-EPINEPHrine (XYLOCAINE W/EPI) 2 %-1:200000 (PF) injection 10 mL (10 mLs Infiltration See Procedure Record 02/20/21 1856)  Tdap (BOOSTRIX) injection 0.5 mL (0.5 mLs Intramuscular Given 02/20/21 1855)    ED Course  I have reviewed the triage vital signs and the nursing notes.  Pertinent labs & imaging results that were available during my care of the patient were reviewed by me and considered in my medical decision making (see chart for details).  65 year old presents for evaluation of mechanical fall.  She is afebrile, nonseptic appearing however does appear  chronically  ill.  Recently discharged from the hospital 5 hours prior to arrival.  Patient states she bent forward out of her wheelchair subsequently falling and hitting the right side of her head.  She is approximately 1 cm laceration to her right eyebrow.  C-collar is in place.  She is neurovascularly intact.  She is ANO x4.  EKG with elevated at V3 however no reciprocal changes.  Denies any chest pain, shortness of breath, diaphoresis or nausea.  Plan on labs, imaging and reassess  Labs and imaging personally reviewed and interpreted:  CBC without leukocytosis BMP with sodium 132, no additional electrolyte, renal or liver abnormality  INR 0.9 Digoxin level 1.0 Troponin 66-->77 DG pelvis without acute normality DG chest without acute abnormality CT head with right periorbital soft tissue swelling however no underlying fracture CT cervical spine without acute abnormality EKG with elevation in V3 however no reciprocal changes. No STEMI  Patient reassessed.  She continues to be without any chest pain, shortness of breath.  She moves all 4 extremities at difficulty.  She has normal mentation.  Discussed mildly elevated troponin level however does not appear to have 1 prior to compare aside from her recent STEMI.  We will plan on consulting with cardiology  CONSULT with Dr. Renella Cunas with Cardiology.  He will plan to assess patient.  Cardiology has assessed patient at bedside.  She continues to be without any chest pain or shortness of breath.  He feels from cardiology standpoint patient is stable for discharge, low suspicion for acute ischemic event. Cardiology states will write note on patient.  I reassessed patient.  Has generalized weakness and difficulty with ambulation at baseline which is why she is at SNF to regain her strength.  Low suspicion for occult fracture.  Patient denies any syncope and states this is purely mechanical fall.  Will DC back to SNF facility.  She will return for any worsening  symptoms  The patient has been appropriately medically screened and/or stabilized in the ED. I have low suspicion for any other emergent medical condition which would require further screening, evaluation or treatment in the ED or require inpatient management.  Patient is hemodynamically stable and in no acute distress.  Patient able to ambulate in department prior to ED.  Evaluation does not show acute pathology that would require ongoing or additional emergent interventions while in the emergency department or further inpatient treatment.  I have discussed the diagnosis with the patient and answered all questions.  Pain is been managed while in the emergency department and patient has no further complaints prior to discharge.  Patient is comfortable with plan discussed in room and is stable for discharge at this time.  I have discussed strict return precautions for returning to the emergency department.  Patient was encouraged to follow-up with PCP/specialist refer to at discharge.   Patient seen and evaluated by attending, Dr. Darl Householder who agrees with above treatment, plan and disposition    MDM Rules/Calculators/A&P                           Final Clinical Impression(s) / ED Diagnoses Final diagnoses:  Fall, initial encounter  Facial laceration, initial encounter    Rx / DC Orders ED Discharge Orders    None       Ashantee Deupree A, [name] 02/20/21 2346    Drenda Freeze, MD 02/21/21 1549

## 2021-02-20 NOTE — TOC Transition Note (Addendum)
Transition of Care [facility]) - CM/SW Discharge Note   Patient Details  Name: Colleen Fleming MRN: [id number] Date of Birth: 07/01/1956  Transition of Care [facility]) CM/SW Contact:  Bary Castilla, LCSW Phone Number:336 [phone] 02/20/2021, 10:28 AM   Clinical Narrative:     Patient will DC to:?[facility] Anticipated DC date:?02/20/2021 Family notified:?Colleen Fleming Transport by: Colleen Fleming   Per MD patient ready for DC to [facility] RN, patient, patient's family, and facility notified of DC. Discharge Summary sent to facility. RN given number for report   [phone] room 212B . DC packet on chart. Ambulance transport requested for patient.   CSW signing off.   Colleen Fleming, Colleen Fleming [phone]   Final next level of care: Skilled Nursing Facility Barriers to Discharge: Barriers Resolved   Patient Goals and CMS Choice Patient states their goals for this hospitalization and ongoing recovery are:: to return home CMS Medicare.gov Compare Post Acute Care list provided to:: Patient Represenative (must comment) (Patients son [name]) Choice offered to / list presented to : NA  Discharge Placement              Patient chooses bed at: [facility] Patient to be transferred to facility by: [place] Name of family member notified: Colleen Fleming Patient and family notified of of transfer: 02/20/21  Discharge Plan and Services In-house Referral: Clinical Social Work Discharge Planning Services: [employer] Consult Post Acute Care Choice: [place]          DME Arranged: N/A DME Agency: NA                  Social Determinants of Health (SDOH) Interventions Food Insecurity Interventions: Intervention Not Indicated,Assist with [employer] Application (Patient reports she was receiving food stamps but needs to reapply. CSW to help with application.) Financial Strain Interventions: Intervention Not Indicated Housing Interventions: Intervention Not Indicated Transportation Interventions: Intervention Not  Indicated   Readmission Risk Interventions Readmission Risk Prevention Plan 02/04/2021  Post Dischage Appt Complete  Transportation Screening Complete  Some recent data might be hidden

## 2021-02-20 NOTE — TOC Progression Note (Signed)
Transition of Care [facility]) - Progression Note    Patient Details  Name: Colleen Fleming MRN: [id number] Date of Birth: May 16, 1956  Transition of Care [facility]) CM/SW [place], [place] Phone Number: [phone] 02/20/2021, 10:11 AM  Clinical Narrative:     CSW spoke with Rolla Plate at [facility] to confirm discharge today. [place] requested that the DC summary state that patient is outside isolation precautions. MD alerted of request and CSW is preparing discharge.   TOC team will continue to assist with discharge planning needs.  Expected Discharge Plan: Skilled Nursing Facility Barriers to Discharge: SNF Covid,SNF Covid Recovering  Expected Discharge Plan and Services Expected Discharge Plan: [place] In-house Referral: Clinical Social Work Discharge Planning Services: CM Consult Post Acute Care Choice: [place] arrangements for the past 2 months: Single Family Home Expected Discharge Date: 02/16/21               DME Arranged: N/A DME Agency: NA                   Social Determinants of Health (SDOH) Interventions Food Insecurity Interventions: Intervention Not Indicated,Assist with SNAP Application (Patient reports she was receiving food stamps but needs to reapply. CSW to help with application.) Financial Strain Interventions: Intervention Not Indicated Housing Interventions: Intervention Not Indicated Transportation Interventions: Intervention Not Indicated  Readmission Risk Interventions Readmission Risk Prevention Plan 02/04/2021  Post Dischage Appt Complete  Transportation Screening Complete  Some recent data might be hidden

## 2021-02-20 NOTE — Discharge Instructions (Signed)
Need to have your sutures removed in approximately 5 days.  Return for new or worsening symptoms

## 2021-02-20 NOTE — Progress Notes (Signed)
Report given to [employer].

## 2021-02-20 NOTE — ED Notes (Signed)
PTAR called  

## 2021-02-21 DIAGNOSIS — W19XXXA Unspecified fall, initial encounter: Secondary | ICD-10-CM | POA: Insufficient documentation

## 2021-02-21 DIAGNOSIS — R9431 Abnormal electrocardiogram [ECG] [EKG]: Secondary | ICD-10-CM

## 2021-02-21 NOTE — Consult Note (Signed)
Cardiology Consultation:   Patient ID: ARTIA Fleming MRN: [id number]; DOB: 1955/10/14  Admit date: 02/20/2021 Date of Consult: 02/21/2021  Primary Care Provider: Elisabeth Cara, [place] [facility] Cardiologist: Glenetta Hew, MD [facility] Electrophysiologist:  None  Patient Profile:   Colleen Fleming is a 65 y.o. female CAD s/p pLAD PCI (01/30/21, STEMI), COPD, GERD, HLD, prior SCLC and MDD who presents with mechanical fall and R head laceration.   History of Present Illness:   Colleen Fleming was discharged several hours prior from [facility] to a skilled nursing facility.  She was trying to reach down from her wheelchair to get a remote and lost her balance falling forward resulting in a head contusion.  She denies any shortness of breath, chest pain, chest pressure, nausea, dizziness, PND, orthopnea.  Prior to her last STEMI she has significant amount of nausea and generally felt unwell although she denied any significant chest discomfort.  She denies any of the similar symptoms during this presentation.  Cardiology is consulted given her recent discharge following PCI.  High-sensitivity troponins were obtained because of convex ST elevation isolated to V3.  She has had similar patterns in V1-V2 but they are more pronounced now in V3 with her last prior ECG on 05/11.  Her troponins during this evaluation were mildly elevated without significant delta (66-77).  3 weeks ago they were over assay during her STEMI.  During my interview with her she was completely asymptomatic and said that she went back to her nursing facility and that she just lost her balance in her wheelchair leaning forward.  Past Medical History:  Diagnosis Date  . Anxiety and depression   . COPD (chronic obstructive pulmonary disease) (HCC)    Long-term smoker greater than 35 years 1 to 2 pack a day.  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 GERD (gastroesophageal reflux disease)    . History of seizure disorder   . Hyperlipidemia   . Obesity   . Small cell lung cancer, left upper lobe ([place]) 2014   Treated with radiation and chemotherapy [facility])   Past Surgical History:  Procedure Laterality Date  . CORONARY/GRAFT ACUTE MI REVASCULARIZATION N/A 01/30/2021   Procedure: Coronary/Graft Acute MI Revascularization;  Surgeon: Leonie Man, MD;  Location: [place] CV LAB;  Service: Cardiovascular;  Laterality: N/A;  . LEFT HEART CATH AND CORONARY ANGIOGRAPHY N/A 01/30/2021   Procedure: LEFT HEART CATH AND CORONARY ANGIOGRAPHY;  Surgeon: Leonie Man, MD;  Location: [place] CV LAB;  Service: Cardiovascular;  Laterality: N/A;  . RIGHT HEART CATH N/A 02/15/2021   Procedure: RIGHT HEART CATH;  Surgeon: Jolaine Artist, MD;  Location: [place] CV LAB;  Service: Cardiovascular;  Laterality: N/A;    Home Medications:  Prior to Admission medications   Medication Sig Start Date End Date Taking? Authorizing Provider  aspirin 81 MG EC tablet Take 1 tablet (81 mg total) by mouth daily. Swallow whole. 02/05/21   Lyda Jester M, [name]  atorvastatin (LIPITOR) 80 MG tablet Take 1 tablet (80 mg total) by mouth daily. 02/05/21   Lyda Jester M, [name]  digoxin (LANOXIN) 0.125 MG tablet Take 1 tablet (0.125 mg total) by mouth daily. 02/16/21   Ghimire, Henreitta Leber, MD  furosemide (LASIX) 40 MG tablet Take 1 tablet (40 mg total) by mouth daily as needed for fluid or edema (for weight gain more than 3 lbs in a day , leg  edema). 02/20/21 02/20/22  Barb Merino, MD  midodrine (PROAMATINE) 10 MG tablet Take 1 tablet (10 mg total) by mouth 3 (three) times daily with meals. 02/16/21   Ghimire, Henreitta Leber, MD  nitroGLYCERIN (NITROSTAT) 0.4 MG SL tablet Place 1 tablet (0.4 mg total) under the tongue every 5 (five) minutes x 3 doses as needed for chest pain. 02/04/21   Lyda Jester M, [name]  potassium chloride SA (KLOR-CON) 20 MEQ tablet Take 1 tablet (20 mEq total) by  mouth daily as needed (when given lasix). 02/20/21   Barb Merino, MD  ticagrelor (BRILINTA) 90 MG TABS tablet Take 1 tablet (90 mg total) by mouth 2 (two) times daily. 02/04/21   Consuelo Pandy, [name]    Inpatient Medications: Scheduled Meds:  Continuous Infusions:  PRN Meds:   Allergies:    Allergies  Allergen Reactions  . Penicillins Hives    Social History:   Social History   Socioeconomic History  . Marital status: Divorced    Spouse name: Not on file  . Number of children: 2  . Years of education: Not on file  . Highest education level: Not on file  Occupational History  . Not on file  Tobacco Use  . Smoking status: Current Every Day Smoker    Packs/day: 1.50    Years: 40.00    Pack years: 60.00  . Smokeless tobacco: Never Used  Substance and Sexual Activity  . Alcohol use: Not Currently  . Drug use: Never  . Sexual activity: Not on file  Other Topics Concern  . Not on file  Social History Narrative   Currently divorced.  No longer working.  Previously worked in [employer].   Lives near [place]   Still smokes about 2 packs a day.   Social Determinants of Health   Financial Resource Strain: Low Risk   . Difficulty of Paying Living Expenses: Not very hard  Food Insecurity: No Food Insecurity  . Worried About Charity fundraiser in the Last Year: Never true  . Ran Out of Food in the Last Year: Never true  Transportation Needs: No Transportation Needs  . Lack of Transportation (Medical): No  . Lack of Transportation (Non-Medical): No  Physical Activity: Not on file  Stress: Not on file  Social Connections: Not on file  Intimate Partner Violence: Not on file    Family History:    Family History  Problem Relation Age of Onset  . Cancer Paternal Grandmother   . Cancer Paternal Aunt     ROS:  Review of Systems: [y] = yes, [ ]  = no       General: Weight gain [ ] ; Weight loss [ ] ; Anorexia [ ] ; Fatigue [ ] ; Fever [ ] ; Chills [  ]; Weakness [ ]     Cardiac: Chest pain/pressure [ ] ; Resting SOB [ ] ; Exertional SOB [ ] ; Orthopnea [ ] ; Pedal Edema [ ] ; Palpitations [ ] ; Syncope [ ] ; Presyncope [ ] ; Paroxysmal nocturnal dyspnea [ ]     Pulmonary: Cough [ ] ; Wheezing [ ] ; Hemoptysis [ ] ; Sputum [ ] ; Snoring [ ]     GI: Vomiting [ ] ; Dysphagia [ ] ; Melena [ ] ; Hematochezia [ ] ; Heartburn [ ] ; Abdominal pain [ ] ; Constipation [ ] ; Diarrhea [ ] ; BRBPR [ ]     GU: Hematuria [ ] ; Dysuria [ ] ; Nocturia [ ]   Vascular: Pain in legs with walking [ ] ; Pain in feet with lying flat [ ] ; Non-healing sores [ ] ;  Stroke [ ] ; TIA [ ] ; Slurred speech [ ] ;    Neuro: Headaches [ ] ; Vertigo [ ] ; Seizures [ ] ; Paresthesias [ ] ;Blurred vision [ ] ; Diplopia [ ] ; Vision changes [ ]     Ortho/Skin: Arthritis [ ] ; Joint pain [ ] ; Muscle pain [ ] ; Joint swelling [ ] ; Back Pain [ ] ; Rash [ ]   Psych: Depression [ ] ; Anxiety [ ]     Heme: Bleeding problems [ ] ; Clotting disorders [ ] ; Anemia [ ]     Endocrine: Diabetes [ ] ; Thyroid dysfunction [ ]   Physical Exam/Data:   Vitals:   02/20/21 2003 02/20/21 2214 02/20/21 2215 02/21/21 0200  BP:  (!) 123/93 (!) 123/93 131/88  Pulse: 89 98 97 75  Resp:  18 (!) 24 16  Temp:  97.9 F (36.6 C)  98.7 F (37.1 C)  TempSrc:  Oral  Oral  SpO2:  99% 100% 99%   No intake or output data in the 24 hours ending 02/21/21 0952 Last 3 Weights 02/19/2021 02/18/2021 02/17/2021  Weight (lbs) 91 lb 7.9 oz 89 lb 4.6 oz 90 lb 2.7 oz  Weight (kg) 41.5 kg 40.5 kg 40.9 kg     There is no height or weight on file to calculate BMI.  General:  Well nourished, well developed, in no acute distress HEENT: normal Lymph: no adenopathy Neck: no JVD Endocrine:  No thryomegaly Vascular: No carotid bruits; FA pulses 2+ bilaterally without bruits  Cardiac:  normal S1, S2; RRR; no murmur  Lungs:  clear to auscultation bilaterally, no wheezing, rhonchi or rales  Abd: soft, nontender, no hepatomegaly  Ext: no edema Musculoskeletal:   No deformities, BUE and BLE strength normal and equal Skin: warm and dry, R head laceration s/p stitches Neuro:  CNs 2-12 intact, no focal abnormalities noted Psych:  Normal affect   EKG:  The EKG was personally reviewed and demonstrates: anterior Q waves, elevations in V3 and minor elevations in V2 not meeting STEMI criteria  Telemetry:  Telemetry was personally reviewed and demonstrates: NSR  Relevant CV Studies:  TTE Result date: 02/13/21 1. Akinesis of the anteroseptal, apical and distal inferior walls with  overall severe LV dysfunction; large, predominantly anterior pericardial  effusion; respiratory flow variation across mitral valve and RA collapse  suggestive of early tamponade  physiology but no RV diastolic collapse and IVC not dilated and collapses.  Compared to 02/01/21, pericardial effusion is larger.  2. Left ventricular ejection fraction, by estimation, is 20 to 25%. The  left ventricle has severely decreased function. The left ventricle  demonstrates regional wall motion abnormalities (see scoring  diagram/findings for description). There is moderate  left ventricular hypertrophy of the basal-septal segment.  3. Right ventricular systolic function is normal. The right ventricular  size is normal.  4. Large pericardial effusion.  5. The mitral valve is normal in structure. No evidence of mitral valve  regurgitation. No evidence of mitral stenosis.  6. The aortic valve is tricuspid. Aortic valve regurgitation is not  visualized. No aortic stenosis is present.  7. The inferior vena cava is normal in size with greater than 50%  respiratory variability, suggesting right atrial pressure of 3 mmHg.  Coronary evaluation Result date: 01/30/21  The left ventricular ejection fraction is 45-50% by visual estimate. Mid to apical anterior hypokinesis.  LV end diastolic pressure is mildly elevated at 13 mmHg, with systolic 98 mmHg.  There is trivial (1+) mitral  regurgitation.  --------------------------------------------  Culprit lesion: Prox LAD lesion is  99% stenosed. (Calcified, thrombotic)  A drug-eluting stent was successfully placed using a SYNERGY XD 2.50X16 -> postdilated 2.8 mm  Post intervention, there is a 0% residual stenosis.  Dist LAD lesion is 80% stenosed -> there is TIMI 0 flow to the apex, post PCI TIMI II flow, but still notable irregularities distally, likely distal embolism.  Otherwise normal coronaries   Laboratory Data:  High Sensitivity Troponin:   Recent Labs  Lab 01/30/21 2115 01/31/21 0122 01/31/21 0730 02/20/21 1650 02/20/21 1948  TROPONINIHS >27,000* >27,000* >27,000* 66* 77*     Chemistry Recent Labs  Lab 02/17/21 0703 02/19/21 0457 02/20/21 1650  NA 131* 130* 132*  K 4.2 4.0 4.5  CL 97* 96* 98  CO2 27 27 26   GLUCOSE 97 94 96  BUN 9 11 17   CREATININE 0.78 0.64 0.64  CALCIUM 8.6* 8.6* 8.6*  GFRNONAA >60 >60 >60  ANIONGAP 7 7 8     Recent Labs  Lab 02/15/21 0650  PROT 5.6*  ALBUMIN 2.9*  AST 29  ALT 22  ALKPHOS 84  BILITOT 0.9   Hematology Recent Labs  Lab 02/15/21 1353 02/15/21 1354 02/20/21 1650  WBC  --   --  7.8  RBC  --   --  3.68*  HGB 11.6* 11.6* 11.8*  HCT 34.0* 34.0* 35.1*  MCV  --   --  95.4  MCH  --   --  32.1  MCHC  --   --  33.6  RDW  --   --  13.0  PLT  --   --  330   BNPNo results for input(s): BNP, PROBNP in the last 168 hours.  DDimer No results for input(s): DDIMER in the last 168 hours.  Radiology/Studies:  DG Chest 2 View  Result Date: 02/20/2021 CLINICAL DATA:  Fall EXAM: CHEST - 2 VIEW COMPARISON:  02/09/2021 chest radiograph. FINDINGS: Stable cardiomediastinal silhouette with top-normal heart size. No pneumothorax. No pleural effusion. Sharply marginated medial upper left lung consolidation, unchanged. Stable mild curvilinear left basilar scarring. Otherwise clear lungs, with no pulmonary edema. No displaced fractures in the visualized chest.  Scattered chronic thoracic vertebral compression fractures are unchanged since 01/31/2021 radiograph. IMPRESSION: 1. No acute cardiopulmonary disease. 2. Chronic sharply marginated medial left upper lung consolidation compatible with prior radiation therapy. Electronically Signed   By: Ilona Sorrel M.D.   On: 02/20/2021 17:26   DG Pelvis 1-2 Views  Result Date: 02/20/2021 CLINICAL DATA:  Fall EXAM: PELVIS - 1-2 VIEW COMPARISON:  None. FINDINGS: No pelvic fracture or diastasis. No evidence of hip dislocation on this frontal view. No suspicious focal osseous lesions. IMPRESSION: No pelvic fracture. Electronically Signed   By: Ilona Sorrel M.D.   On: 02/20/2021 17:28   CT Head Wo Contrast  Result Date: 02/20/2021 CLINICAL DATA:  Status post fall. EXAM: CT HEAD WITHOUT CONTRAST TECHNIQUE: Contiguous axial images were obtained from the base of the skull through the vertex without intravenous contrast. COMPARISON:  MR head, dated Feb 03, 2017 FINDINGS: Brain: There is mild to moderate severity cerebral atrophy with widening of the extra-axial spaces and ventricular dilatation. There are areas of decreased attenuation within the white matter tracts of the supratentorial brain, consistent with microvascular disease changes. Chronic bilateral basal ganglia lacunar infarcts are noted. Vascular: No hyperdense vessel or unexpected calcification. Skull: Normal. Negative for fracture or focal lesion. Sinuses/Orbits: A 2.1 cm x 1.4 cm anterior left maxillary sinus polyp versus mucous retention cyst is seen with mild posterior left maxillary  sinus mucosal thickening. Other: Mild right periorbital soft tissue swelling is present. IMPRESSION: 1. Generalized cerebral atrophy. 2. Mild right periorbital soft tissue swelling without acute fracture or acute intracranial abnormality. Electronically Signed   By: Virgina Norfolk M.D.   On: 02/20/2021 17:12   CT Cervical Spine Wo Contrast  Result Date: 02/20/2021 CLINICAL DATA:   Fall.  Head and trauma.  Initial encounter. EXAM: CT CERVICAL SPINE WITHOUT CONTRAST TECHNIQUE: Multidetector CT imaging of the cervical spine was performed without intravenous contrast. Multiplanar CT image reconstructions were also generated. COMPARISON:  None. FINDINGS: Alignment: Normal. Skull base and vertebrae: No acute fracture. No primary bone lesion or focal pathologic process. Soft tissues and spinal canal: No prevertebral fluid or swelling. No visible canal hematoma. Disc levels: No disc space narrowing. Mild-to-moderate facet DJD is seen on the left from levels of C3-C6. Upper chest: No acute findings.  Scarring noted in left lung apex. Other: None. IMPRESSION: No evidence of cervical spine fracture or subluxation. Mild-to-moderate left facet DJD. Electronically Signed   By: Marlaine Hind M.D.   On: 02/20/2021 17:13    Assessment and Plan:   1. ECG abnormalities/hsT elevation 2. Head trauma I reviewed Ms. Tosh's prior evaluation with 2 recent hospitalizations, 1 for STEMI, in discharged yesterday for diarrhea and hyponatremia.  She is asymptomatic from a cardiac standpoint and was discharged the same day to skilled nursing facility before experiencing a mechanical fall with no prodromal symptoms, palpitations, or syncopal episode.  It is hard to know how to interpret the mildly elevated troponin following a prolonged hospitalization for severe hyponatremia, heart failure exacerbation, and diarrhea.  She has signs of prior anterior ischemia following her STEMI which was similar to before.  Elevations in V3 are more prominent with her most recent ECGs as opposed to prior were V1-V2 are more pronounced.  Either way given that she is asymptomatic and all of her other lab work has been unremarkable I do not think that she warrants further cardiac risk assessment.  She has been maintained on DAPT as an inpatient without any missed doses.  Given that she is otherwise asymptomatic she does not want to be  admitted to the hospital for any further cardiac evaluation and wants to be discharged back to her facility which I think is reasonable.  CT did not show any acute bleed.  For questions or updates, please contact [place] Please consult [email or web address] for contact info under   Signed, Dion Body, MD  02/21/2021 9:52 AM

## 2021-02-22 NOTE — Progress Notes (Signed)
02/22/21 3:04pm - Patients son Charolett Yarrow [phone] called the CSW to ask about getting his mom Medicare and becoming her POA and also getting her to another rehab facility as she has fallen twice while at [facility]. The patient was brought back to the hospital after being discharged Saturday 02/20/21 to get stitches from the fall and then was discharged back to [facility] and the son would like her to go to another facility. CSW informed [name] that he could try calling [place] the other facility that extended a bed offer and see if they have availability or reach out to the list of ALF's or long term facilities per the list that the [place] emailed to [place] also emailed the [employer] list of SNF's and if he wanted to call around to those facilities and see if they have availability for his mom. CSW sent an in-basket to the outpatient HV CSW regarding the Medicare and POA as she has an outpatient HV appointment scheduled for 03/09/21. CSW informed the patients son of all of this as well.  Sipriano Fendley, MSW, [place] Heart Failure Social Worker

## 2021-02-23 ENCOUNTER — Telehealth (HOSPITAL_COMMUNITY): Payer: Self-pay | Admitting: Licensed Clinical Social Worker

## 2021-02-23 NOTE — Telephone Encounter (Signed)
CSW received consult from inpatient HF TOC CSW to follow up with pt son regarding getting POA for pt and when she will be eligible for Medicare.  CSW had spoken with pt previously about when she would qualify for Medicare and it had sounded like she was receiving SSI benefits- under these benefits she would not qualify until she reaches the age of 41.  CSW explained this to son but encouraged to call SSA with pt if he wanted to confirm what kind of benefit she was receiving.  Informed that if she was getting SSDI she could get Medicare after 2 years of disability.  Pt son had also already spoken to someone about getting POA- was informed that he needs to go to the courthouse to get paperwork to apply for POA and get it signed and notarized by the patient- pt son planning to follow up regarding this.  Pt son other concern is what to do for him mom long term- he is worried that she will not get to a place where she is safe to return home.  Has already spoken to [facility], where pt is currently, regarding this and they have stated they have long term Medicaid bed for pt that she can transition to after her rehab stay.  Pt son would like list of other SNF options to look into if pt needing longer term care.  CSW emailed pt son list of local SNF and ALF options.  No further needs reported at this time- will continue to follow through clinic and assist as needed  Jorge Ny, [place] Worker [facility] Desk#: [phone] Cell#: [phone]

## 2021-03-09 ENCOUNTER — Encounter (HOSPITAL_COMMUNITY): Payer: Self-pay

## 2021-03-09 ENCOUNTER — Other Ambulatory Visit: Payer: Self-pay

## 2021-03-09 ENCOUNTER — Ambulatory Visit (HOSPITAL_COMMUNITY)
Admission: RE | Admit: 2021-03-09 | Discharge: 2021-03-09 | Disposition: A | Discharge status: Home / Self Care | Payer: Medicaid Other | Source: Ambulatory Visit | Attending: Cardiology | Admitting: Cardiology

## 2021-03-09 VITALS — BP 112/68 | HR 98 | Wt 89.2 lb

## 2021-03-09 DIAGNOSIS — J449 Chronic obstructive pulmonary disease, unspecified: Secondary | ICD-10-CM | POA: Diagnosis not present

## 2021-03-09 DIAGNOSIS — Z9221 Personal history of antineoplastic chemotherapy: Secondary | ICD-10-CM | POA: Insufficient documentation

## 2021-03-09 DIAGNOSIS — Z79899 Other long term (current) drug therapy: Secondary | ICD-10-CM | POA: Insufficient documentation

## 2021-03-09 DIAGNOSIS — I2510 Atherosclerotic heart disease of native coronary artery without angina pectoris: Secondary | ICD-10-CM | POA: Insufficient documentation

## 2021-03-09 DIAGNOSIS — Z9861 Coronary angioplasty status: Secondary | ICD-10-CM

## 2021-03-09 DIAGNOSIS — Z7982 Long term (current) use of aspirin: Secondary | ICD-10-CM | POA: Diagnosis not present

## 2021-03-09 DIAGNOSIS — I5022 Chronic systolic (congestive) heart failure: Secondary | ICD-10-CM | POA: Insufficient documentation

## 2021-03-09 DIAGNOSIS — Z85118 Personal history of other malignant neoplasm of bronchus and lung: Secondary | ICD-10-CM | POA: Diagnosis not present

## 2021-03-09 DIAGNOSIS — F17200 Nicotine dependence, unspecified, uncomplicated: Secondary | ICD-10-CM | POA: Diagnosis not present

## 2021-03-09 DIAGNOSIS — Z923 Personal history of irradiation: Secondary | ICD-10-CM | POA: Insufficient documentation

## 2021-03-09 DIAGNOSIS — I255 Ischemic cardiomyopathy: Secondary | ICD-10-CM | POA: Diagnosis not present

## 2021-03-09 DIAGNOSIS — Z880 Allergy status to penicillin: Secondary | ICD-10-CM | POA: Diagnosis not present

## 2021-03-09 DIAGNOSIS — R2689 Other abnormalities of gait and mobility: Secondary | ICD-10-CM | POA: Insufficient documentation

## 2021-03-09 DIAGNOSIS — I252 Old myocardial infarction: Secondary | ICD-10-CM | POA: Diagnosis not present

## 2021-03-09 DIAGNOSIS — Z8616 Personal history of COVID-19: Secondary | ICD-10-CM | POA: Insufficient documentation

## 2021-03-09 LAB — BASIC METABOLIC PANEL
Anion gap: 10 (ref 5–15)
BUN: <5 mg/dL — ABNORMAL LOW (ref 8–23)
CO2: 24 mmol/L (ref 22–32)
Calcium: 9.2 mg/dL (ref 8.9–10.3)
Chloride: 99 mmol/L (ref 98–111)
Creatinine, Ser: 0.73 mg/dL (ref 0.44–1.00)
GFR, Estimated: >60 mL/min (ref >60)
Glucose, Bld: 101 mg/dL — ABNORMAL HIGH (ref 70–99)
Potassium: 3.4 mmol/L — ABNORMAL LOW (ref 3.5–5.1)
Sodium: 133 mmol/L — ABNORMAL LOW (ref 135–145)

## 2021-03-09 LAB — DIGOXIN LEVEL: Digoxin Level: 1.4 ng/mL (ref 0.8–2.0)

## 2021-03-09 NOTE — Patient Instructions (Signed)
It was great to see you today! No medication changes are needed at this time.  Labs today We will only contact you if something comes back abnormal or we need to make some changes. Otherwise no news is good news!  Your physician recommends that you schedule a follow-up appointment in: 4 weeks  in the Advanced Practitioners (PA/NP) [facility], you and your health needs are our priority. As part of our continuing mission to provide you with exceptional heart care, we have created designated Provider Care Teams. These Care Teams include your primary Cardiologist (physician) and Advanced Practice Providers (APPs- Physician Assistants and Nurse Practitioners) who all work together to provide you with the care you need, when you need it.   You may see any of the following providers on your designated Care Team at your next follow up: Marland Kitchen Dr Glori Bickers . Dr Loralie Champagne . Dr Vickki Muff . Darrick Grinder, NP . Lyda Jester, [place] . Audry Riles, PharmD   Please be sure to bring in all your medications bottles to every appointment.   If you have any questions or concerns before your next appointment please send [place] a message through [place] or call our office at [phone].    TO LEAVE A MESSAGE FOR THE NURSE SELECT OPTION 2, PLEASE LEAVE A MESSAGE INCLUDING: . YOUR NAME . DATE OF BIRTH . CALL BACK NUMBER . REASON FOR CALL**this is important as we prioritize the call backs  YOU WILL RECEIVE A CALL BACK THE SAME DAY AS LONG AS YOU CALL BEFORE 4:00 PM

## 2021-03-09 NOTE — Progress Notes (Signed)
Advanced Heart Failure Clinic Note   Referring Physician: PCP: Elisabeth Cara, [name] PCP-Cardiologist: Glenetta Hew, MD  AHF: Dr. Haroldine Laws   Reason for Visit: [facility] F/u for Systolic Heart Failure/ Ischemic CM s/p Anterior STEMI   HPI:  65 y/o WF, long time heavy smoker, w/ COPD and h/o lung cancer treated w/ chemo and radiation, now in remission, admitted on 4/30 for acute anterior STEMI. Emergent LHC showed 99% pLAD occlusion treated w/ PCI + DES, also w/ 80% dLAD stenosis to be treated medically but no other coronary disease. Hs trop >27,000. LDL 139. Initial LVG at time of cath w/ mildly reduced LVEF, estimated ~45-50%. However 2D echo showed severely reduced LVEF, 25-30% w/ severe HK of the left ventricular, entire anteroseptal and anterior wall. No MR. RV normal.Small pericardial effusion.  She was placed on DAPT w/ ASA + Brilinta + high intensity statin + low dose?blocker and SGLT2i (hgb A1c 5.4).   On 5/2, she became hypotensive w/ concerns for early developing CGSand AHF team was consulted.CO2 was21, though Co-ox was ok at 68%. She wasstarted on NE for hypotension.Repeat limited echoshowed no mechanical complications.RV wassmall. Small pericardial effusion also noted. CVP waslow at 2. Scr was normal at 0.77. Felt to be dry. Diuretics and Wilder Glade were discontinued and she was given IVF boluses. BP improved and she was able to wean off NE w/ stable co-ox. Her BP did remain soft but she was able to tolerate low dose Toprol XL (added for PACs) and low dose spiro. PACs resolved w/ low dose BP and correction of hypokalemia.  Also of note, she had mild diarrhea during prior admit that resolved w/ imodium. Also w/ incidental finding of atraumatic posterolateral left sixth and seventh rib fractures noted on CXR. Given h/o lung cancer and ongoing tobacco use, there was concern for pathologic fx from possible recurrence of lung CA. She was advised to f/u w/ her PCP  and oncologist for further evaluation. She was discharged home on 5/5.  Returned back to the ED on 5/11 w/ diarrhea and weakness. Found to be severely hyponatremic w/ Na of 117 and hypokalemic at 2.8. Hypocalcemic 7.7 CO2 20. Hypotensive w/ SBPs 80s-90s. COVID +. She was admitted and started on IVF hydration w/ normal saline and started on K and Ca supplementation. Placed on Remdesivir for [place]. AHF team consulted for HF management. Underwent RHC showing low filling pressures w/ moderately reduced cardiac output. Gentle IVF hydration recommended. No need for inotropic support. Once stabilized and improved, she was d/c to SNF for rehab.   She presents to clinic today for f/u. Still at SNF. Reports she has 2 more weeks of rehab. Here w/ her sister and in wheel chair. No paperwork sent from rehab center but she reports they have been giving her her meds. Denies CP. No exertional or resting dyspnea. No LEE. Appepitite is still not great. Unable to smoke at [facility] and has been wearing nicotine patch. Still reports poor balance but feels that she is getting a bit stronger w/ therapy. Denies any falls at nursing facility.    [place] 01/2021   Findings:  RA = 2 RV = 17/2 PA = 17/4 (8) PCW = 4 Fick cardiac output/index = 3.0/2.4 Thermo CO/CI = 2.8/2.2 PVR = 1.3 Ao sat = 98% PA sat = 64%, 65%  Assessment: 1. Low filling pressures with moderately reduced cardiac output  Plan/Discussion:  Hydrate gently. No role for inotropic support at this time.   Echo 5/22 LVEF 20-25%.  RV normal. Moderate pericardial effusion no tamponade physiology   LHC 5/22  Diagnostic Dominance: Right    Intervention        Review of systems complete and found to be negative unless listed in HPI.      Past Medical History:  Diagnosis Date  . Anxiety and depression   . COPD (chronic obstructive pulmonary disease) (HCC)    Long-term smoker greater than 35 years 1 to 2 pack a day.  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 GERD (gastroesophageal reflux disease)   . History of seizure disorder   . Hyperlipidemia   . Obesity   . Small cell lung cancer, left upper lobe ([place]) 2014   Treated with radiation and chemotherapy [facility])    Current Outpatient Medications  Medication Sig Dispense Refill  . aspirin 81 MG EC tablet Take 1 tablet (81 mg total) by mouth daily. Swallow whole. 30 tablet 11  . atorvastatin (LIPITOR) 80 MG tablet Take 1 tablet (80 mg total) by mouth daily. 30 tablet 5  . digoxin (LANOXIN) 0.125 MG tablet Take 1 tablet (0.125 mg total) by mouth daily. 30 tablet 0  . furosemide (LASIX) 40 MG tablet Take 1 tablet (40 mg total) by mouth daily as needed for fluid or edema (for weight gain more than 3 lbs in a day , leg edema). 30 tablet 11  . midodrine (PROAMATINE) 10 MG tablet Take 1 tablet (10 mg total) by mouth 3 (three) times daily with meals. 90 tablet 1  . nitroGLYCERIN (NITROSTAT) 0.4 MG SL tablet Place 1 tablet (0.4 mg total) under the tongue every 5 (five) minutes x 3 doses as needed for chest pain. 25 tablet 2  . potassium chloride SA (KLOR-CON) 20 MEQ tablet Take 1 tablet (20 mEq total) by mouth daily as needed (when given lasix).    . ticagrelor (BRILINTA) 90 MG TABS tablet Take 1 tablet (90 mg total) by mouth 2 (two) times daily. 60 tablet 5   No current facility-administered medications for this encounter.    Allergies  Allergen Reactions  . Penicillins Hives      Social History   Socioeconomic History  . Marital status: Divorced    Spouse name: Not on file  . Number of children: 2  . Years of education: Not on file  . Highest education level: Not on file  Occupational History  . Not on file  Tobacco Use  . Smoking status: Current Every Day Smoker    Packs/day: 1.50    Years: 40.00    Pack years: 60.00  . Smokeless tobacco: Never Used  Substance and Sexual Activity  . Alcohol use: Not Currently  . Drug  use: Never  . Sexual activity: Not on file  Other Topics Concern  . Not on file  Social History Narrative   Currently divorced.  No longer working.  Previously worked in [employer].   Lives near [place].   Still smokes about 2 packs a day.   Social Determinants of Health   Financial Resource Strain: Low Risk   . Difficulty of Paying Living Expenses: Not very hard  Food Insecurity: No Food Insecurity  . Worried About Charity fundraiser in the Last Year: Never true  . Ran Out of Food in the Last Year: Never true  Transportation Needs: No Transportation Needs  . Lack of Transportation (Medical): No  . Lack of Transportation (Non-Medical): No  Physical Activity: Not on file  Stress: Not on file  Social Connections: Not on file  Intimate Partner Violence: Not on file      Family History  Problem Relation Age of Onset  . Cancer Paternal Grandmother   . Cancer Paternal Aunt     Vitals:   03/09/21 1220  BP: 112/68  Pulse: 98  SpO2: 95%  Weight: 40.5 kg (89 lb 3.2 oz)     PHYSICAL EXAM: General:  Well appearing, thin WF in WC, looks older than actual age. No respiratory difficulty, kyphotic posture  HEENT: normal Neck: supple. no JVD. Carotids 2+ bilat; no bruits. No lymphadenopathy or thyromegaly appreciated. Cor: PMI nondisplaced. Regular rate & rhythm. No rubs, gallops or murmurs. Lungs: clear Abdomen: soft, nontender, nondistended. No hepatosplenomegaly. No bruits or masses. Good bowel sounds. Extremities: no cyanosis, clubbing, rash, thin LEs no edema Neuro: alert & oriented x 3, cranial nerves grossly intact. moves all 4 extremities w/o difficulty. Affect pleasant.  ECG: not performed    ASSESSMENT & PLAN:  1. CAD  - s/p Anterior STEMI 01/2021  - cath w/ 99% pLAD occlusion treated w/ PCI/ DES + 80% dLAD to be treated medically - stable w/o s/s of angina  - c/w DAPT w/ ASA + Brilinta for a minimum of 1 year   - c/w statin  - smoking cessation  advised   2. 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 No MR. RV normal. - Limited Echo 02/13/21 EF 20-25% moderate pericardial effusion. No overt tamponade. - RHC 5/22 w/  low filling pressures w/ moderately reduced cardiac output - NYHA Class II-III, though not very active. Euvolemic on exam. Low BP has limited her ability to tolerate GDMT, she has required midodrine for BP support - Will attempt to wean down midodrine to 5 tid and may try re-introducing low dose spiro vs losartan at next visit if BP allows - continue digoxin, 0.125 and check dig level and BMP today  - continue Lasix PRN    3. H/o Lung Cancer - s/p chemo + radiation.  - followed by oncology  - advised to quit smoking   4. COVID-19 Infection 5/22 - treated w/ Remdesivir x 3 days  - recovered   5. H/o Recent Hyponatremia - Check BMP today   Lyda Jester, [name] 03/09/21

## 2021-03-30 ENCOUNTER — Ambulatory Visit (INDEPENDENT_AMBULATORY_CARE_PROVIDER_SITE_OTHER): Payer: Medicaid Other | Admitting: Physician Assistant

## 2021-03-30 ENCOUNTER — Encounter: Payer: Self-pay | Admitting: Physician Assistant

## 2021-03-30 ENCOUNTER — Other Ambulatory Visit: Payer: Self-pay

## 2021-03-30 VITALS — BP 122/75 | HR 86 | Ht <= 58 in | Wt 92.8 lb

## 2021-03-30 DIAGNOSIS — I5022 Chronic systolic (congestive) heart failure: Secondary | ICD-10-CM | POA: Diagnosis not present

## 2021-03-30 DIAGNOSIS — I959 Hypotension, unspecified: Secondary | ICD-10-CM

## 2021-03-30 DIAGNOSIS — E785 Hyperlipidemia, unspecified: Secondary | ICD-10-CM | POA: Diagnosis not present

## 2021-03-30 DIAGNOSIS — I2510 Atherosclerotic heart disease of native coronary artery without angina pectoris: Secondary | ICD-10-CM | POA: Diagnosis not present

## 2021-03-30 DIAGNOSIS — E876 Hypokalemia: Secondary | ICD-10-CM

## 2021-03-30 LAB — HEPATIC FUNCTION PANEL
ALT: 8 IU/L (ref 0–32)
AST: 14 IU/L (ref 0–40)
Albumin: 4.1 g/dL (ref 3.8–4.8)
Alkaline Phosphatase: 104 IU/L (ref 44–121)
Bilirubin Total: 0.3 mg/dL (ref 0.0–1.2)
Bilirubin, Direct: <0.1 mg/dL (ref 0.00–0.40)
Total Protein: 6.6 g/dL (ref 6.0–8.5)

## 2021-03-30 LAB — LIPID PANEL
Chol/HDL Ratio: 3 ratio (ref 0.0–4.4)
Cholesterol, Total: 193 mg/dL (ref 100–199)
HDL: 65 mg/dL (ref >39)
LDL Chol Calc (NIH): 106 mg/dL — ABNORMAL HIGH (ref 0–99)
Triglycerides: 123 mg/dL (ref 0–149)
VLDL Cholesterol Cal: 22 mg/dL (ref 5–40)

## 2021-03-30 MED ORDER — MIDODRINE HCL 5 MG PO TABS: 5.0000 mg | ORAL_TABLET | Freq: Three times a day (TID) | ORAL | 0 refills | Status: DC

## 2021-03-30 NOTE — Progress Notes (Signed)
Cardiology Office Note:    Date:  04/01/2021   ID:  Colleen Fleming, Colleen Fleming 04/30/1956, MRN [id number]  PCP:  Elisabeth Cara, [name]   [facility] Providers Cardiologist:  Glenetta Hew, MD     Referring MD: Elisabeth Cara, *   Chief Complaint  Patient presents with   Follow-up    Seen for Dr. Ellyn Hack    History of Present Illness:    Colleen Fleming is a 65 y.o. female with a hx of COPD, lung CA s/p chemo and radiation, heavy tobacco abuse, CAD, ICM, HLD and obesity.  Patient was initially admitted on 01/30/2021 with acute anterior STEMI.  Emergent cardiac catheterization revealed 99% proximal LAD occlusion treated with DES, 80% distal LAD lesion treated medically, no other significant coronary artery disease.  Serial troponin was greater than 27,000.  Although LV gram on initial cath showed EF 45 to 50%, however follow-up echocardiogram revealed severely reduced LVEF of 25 to 30% with wall motion abnormality in the anterior wall, small pericardial effusion.  Postprocedure, she was placed on aspirin, Brilinta, high intensity statin, and beta-blocker.  Hospital course was complicated by hypotension on 5/2 required nor epi.  Repeat limited echocardiogram showed no obvious complication, RV was small, small pericardial effusion was noted.  She was felt to be dry and was treated with IV fluid.  Diuretic and Wilder Glade were discontinued.  Unfortunately, shortly after discharge, patient returned back to the hospital on 02/10/2021 with diarrhea and weakness.  On arrival, she was hypokalemic with potassium of 2.8.  Sodium was also low at 117.  She had hypocalcemia of 7.7.  COVID test came back positive.  Patient was started with IV fluid for hydration.  Midodrine was added to help elevated blood pressure.  Repeat echocardiogram obtained on 02/13/2021 continue to show EF 20 to 25%, moderate pericardial effusion, no overt tamponade.  Right heart cath performed on 02/15/2021 showed wedge pressure 4, RV  pressure 17/2, cardiac output of 3.0, cardiac index 2.4.  Low filling pressure was moderately reduced cardiac output was noted.  Patient was hydrated.  Heart failure medication titration was limited by low blood pressure and volume depletion.  She was placed on digoxin.  Beta-blocker was removed.  Most recently, patient went back to the ED on 02/20/2021 after losing her balance and fell forward resulting in a head contusion.  She was seen by overnight cardiology fellow.  High-sensitivity troponin was borderline high however remained flat.  No further cardiac evaluation was recommended.  Since discharge, patient has been seen by Lyda Jester [name] of heart failure service on 03/09/2021.  Repeat blood work showed borderline low sodium and potassium level.  He digoxin level normal.  She currently resides at [facility] and rehab center.  Patient presents today for cardiology follow-up.  She denies any chest pain or worsening shortness of breath.  She is on aspirin and Brilinta.  We will check a fasting lipid panel and LFT today.  Blood pressure is normal, I will reduce the midodrine to 5 mg 3 times a day.  Based on the record from [place], the last time she took the 10 mg midodrine was yesterday.  Over the next few visit, we will likely wean her off of midodrine and start her back on heart failure medication.  She is scheduled to see our heart failure service on 04/08/2021.  I will defer to heart failure service to titrate heart failure medication to repeat echocardiogram.  She can follow-up with Dr. Ellyn Hack  in 3 to 4 months.  Note, patient asked for a letter to get her electric wheelchair.  I advised patient that electric wheelchair is for patient who cannot get up and walk.  In her case, I would be in favor of her continue with cardiac rehab at the start walking and exercising more.  She says she understand that and the electric wheelchair at least temporarily.  I will forward this to her primary  cardiologist for him to decide whether to provide a letter.   Past Medical History:  Diagnosis Date   Anxiety and depression    COPD (chronic obstructive pulmonary disease) ([place])    Long-term smoker greater than 35 years 1 to 2 pack a day.   DJD (degenerative joint disease) of thoracic spine 2018   Noted on MRI   GERD (gastroesophageal reflux disease)    History of seizure disorder    Hyperlipidemia    Obesity    Small cell lung cancer, left upper lobe ([place]) 2014   Treated with radiation and chemotherapy [facility])    Past Surgical History:  Procedure Laterality Date   CORONARY/GRAFT ACUTE MI REVASCULARIZATION N/A 01/30/2021   Procedure: Coronary/Graft Acute MI Revascularization;  Surgeon: Leonie Man, MD;  Location: [place] [facility];  Service: Cardiovascular;  Laterality: N/A;   LEFT HEART CATH AND CORONARY ANGIOGRAPHY N/A 01/30/2021   Procedure: LEFT HEART CATH AND CORONARY ANGIOGRAPHY;  Surgeon: Leonie Man, MD;  Location: [place] [facility];  Service: Cardiovascular;  Laterality: N/A;   RIGHT HEART CATH N/A 02/15/2021   Procedure: RIGHT HEART CATH;  Surgeon: Jolaine Artist, MD;  Location: [place] [facility];  Service: Cardiovascular;  Laterality: N/A;    Current Medications: Current Meds  Medication Sig   aspirin 81 MG EC tablet Take 1 tablet (81 mg total) by mouth daily. Swallow whole.   atorvastatin (LIPITOR) 80 MG tablet Take 1 tablet (80 mg total) by mouth daily.   digoxin (LANOXIN) 0.125 MG tablet Take 1 tablet (0.125 mg total) by mouth daily.   furosemide (LASIX) 40 MG tablet Take 1 tablet (40 mg total) by mouth daily as needed for fluid or edema (for weight gain more than 3 lbs in a day , leg edema).   nitroGLYCERIN (NITROSTAT) 0.4 MG SL tablet Place 1 tablet (0.4 mg total) under the tongue every 5 (five) minutes x 3 doses as needed for chest pain.   potassium chloride SA (KLOR-CON) 20 MEQ tablet Take 1 tablet (20 mEq total) by mouth daily as needed  (when given lasix).   ticagrelor (BRILINTA) 90 MG TABS tablet Take 1 tablet (90 mg total) by mouth 2 (two) times daily.   [DISCONTINUED] midodrine (PROAMATINE) 10 MG tablet Take 1 tablet (10 mg total) by mouth 3 (three) times daily with meals.     Allergies:   Penicillins   Social History   Socioeconomic History   Marital status: Divorced    Spouse name: Not on file   Number of children: 2   Years of education: Not on file   Highest education level: Not on file  Occupational History   Not on file  Tobacco Use   Smoking status: Every Day    Packs/day: 1.50    Years: 40.00    Pack years: 60.00    Types: Cigarettes   Smokeless tobacco: Never  Substance and Sexual Activity   Alcohol use: Not Currently   Drug use: Never   Sexual activity: Not on file  Other Topics Concern   Not on file  Social History Narrative   Currently divorced.  No longer working.  Previously worked in [employer].   Lives near [place].   Still smokes about 2 packs a day.   Social Determinants of Health   Financial Resource Strain: Low Risk    Difficulty of Paying Living Expenses: Not very hard  Food Insecurity: No Food Insecurity   Worried About Charity fundraiser in the Last Year: Never true   Ran Out of Food in the Last Year: Never true  Transportation Needs: No Transportation Needs   Lack of Transportation (Medical): No   Lack of Transportation (Non-Medical): No  Physical Activity: Not on file  Stress: Not on file  Social Connections: Not on file     Family History: The patient's family history includes Cancer in her paternal aunt and paternal grandmother.  ROS:   Please see the history of present illness.     All other systems reviewed and are negative.  EKGs/Labs/Other Studies Reviewed:    The following studies were reviewed today:  Echo 02/13/2021  1. Akinesis of the anteroseptal, apical and distal inferior walls with  overall severe LV dysfunction; large,  predominantly anterior pericardial  effusion; respiratory flow variation across mitral valve and RA collapse  suggestive of early tamponade  physiology but no RV diastolic collapse and IVC not dilated and collapses.  Compared to 02/01/21, pericardial effusion is larger.   2. Left ventricular ejection fraction, by estimation, is 20 to 25%. The  left ventricle has severely decreased function. The left ventricle  demonstrates regional wall motion abnormalities (see scoring  diagram/findings for description). There is moderate  left ventricular hypertrophy of the basal-septal segment.   3. Right ventricular systolic function is normal. The right ventricular  size is normal.   4. Large pericardial effusion.   5. The mitral valve is normal in structure. No evidence of mitral valve  regurgitation. No evidence of mitral stenosis.   6. The aortic valve is tricuspid. Aortic valve regurgitation is not  visualized. No aortic stenosis is present.   7. The inferior vena cava is normal in size with greater than 50%  respiratory variability, suggesting right atrial pressure of 3 mmHg.   EKG:  EKG is not ordered today.    Recent Labs: 02/01/2021: B Natriuretic Peptide 792.9 02/10/2021: TSH 1.140 02/12/2021: Magnesium 1.9 02/20/2021: Hemoglobin 11.8; Platelets 330 03/09/2021: BUN <5; Creatinine, Ser 0.73; Potassium 3.4; Sodium 133 03/30/2021: ALT 8  Recent Lipid Panel    Component Value Date/Time   CHOL 193 03/30/2021 1021   TRIG 123 03/30/2021 1021   HDL 65 03/30/2021 1021   CHOLHDL 3.0 03/30/2021 1021   CHOLHDL 3.5 01/31/2021 0632   VLDL 11 01/31/2021 0632   LDLCALC 106 (H) 03/30/2021 1021     Risk Assessment/Calculations:           Physical Exam:    VS:  BP 122/75   Pulse 86   Ht 4\' 9"  (1.448 m)   Wt 92 lb 12.8 oz (42.1 kg)   SpO2 100%   BMI 20.08 kg/m     Wt Readings from Last 3 Encounters:  03/30/21 92 lb 12.8 oz (42.1 kg)  03/09/21 89 lb 3.2 oz (40.5 kg)  02/19/21 91 lb 7.9 oz  (41.5 kg)     GEN:  Well nourished, well developed in no acute distress HEENT: Normal NECK: No JVD; No carotid bruits LYMPHATICS: No lymphadenopathy CARDIAC: RRR, no  murmurs, rubs, gallops RESPIRATORY:  Clear to auscultation without rales, wheezing or rhonchi  ABDOMEN: Soft, non-tender, non-distended MUSCULOSKELETAL:  No edema; No deformity  SKIN: Warm and dry NEUROLOGIC:  Alert and oriented x 3 PSYCHIATRIC:  Normal affect   ASSESSMENT:    1. Hypotension, unspecified hypotension type   2. Chronic systolic CHF (congestive heart failure), NYHA class 2 ([place])   3. Coronary artery disease involving native coronary artery of native heart without angina pectoris   4. Hyperlipidemia LDL goal <70   5. Hypokalemia    PLAN:    In order of problems listed above:  Hypotension: We will begin to wean off midodrine.  Decrease midodrine to 5 mg 3 times daily dosing  Chronic systolic heart failure: EF 20 to 25% as result of ischemic cardiomyopathy.  Unable to tolerate heart failure medication due to hypotension.  Once midodrine is weaned off, will plan to restart heart failure medication  CAD: Denies any chest pain.  Continue aspirin and Brilinta  Hyperlipidemia: Continue Lipitor.  Repeat fasting lipid panel and LFT  Hypokalemia: Improved on the last lab work.     Medication Adjustments/Labs and Tests Ordered: Current medicines are reviewed at length with the patient today.  Concerns regarding medicines are outlined above.  Orders Placed This Encounter  Procedures   Lipid panel   Hepatic function panel   Meds ordered this encounter  Medications   midodrine (PROAMATINE) 5 MG tablet    Sig: Take 1 tablet (5 mg total) by mouth 3 (three) times daily with meals.    Dispense:  90 tablet    Refill:  0    Patient Instructions  Medication Instructions:  DECREASE midodrine to 5mg  (1 tablet) three times daily.   *If you need a refill on your cardiac medications before your next  appointment, please call your pharmacy*   Lab Work: Lipid, liver panel.    Testing/Procedures: None ordered.    Follow-Up: At [facility], you and your health needs are our priority.  As part of our continuing mission to provide you with exceptional heart care, we have created designated Provider Care Teams.  These Care Teams include your primary Cardiologist (physician) and Advanced Practice Providers (APPs -  Physician Assistants and Nurse Practitioners) who all work together to provide you with the care you need, when you need it.  We recommend signing up for the patient portal called "MyChart".  Sign up information is provided on this After Visit Summary.  MyChart is used to connect with patients for Virtual Visits (Telemedicine).  Patients are able to view lab/test results, encounter notes, upcoming appointments, etc.  Non-urgent messages can be sent to your provider as well.   To learn more about what you can do with MyChart, go to [email or web address].    Your next appointment:   3-4 month(s)  The format for your next appointment:   In Person  Provider:   Glenetta Hew, MD   Other Instructions Please keep your appointment with heart failure clinic.    Hilbert Corrigan, [place]  04/01/2021 9:05 PM    [facility]

## 2021-03-30 NOTE — Patient Instructions (Addendum)
Medication Instructions:  DECREASE midodrine to 5mg  (1 tablet) three times daily.   *If you need a refill on your cardiac medications before your next appointment, please call your pharmacy*   Lab Work: Lipid, liver panel.    Testing/Procedures: None ordered.    Follow-Up: At [facility], you and your health needs are our priority.  As part of our continuing mission to provide you with exceptional heart care, we have created designated Provider Care Teams.  These Care Teams include your primary Cardiologist (physician) and Advanced Practice Providers (APPs -  Physician Assistants and Nurse Practitioners) who all work together to provide you with the care you need, when you need it.  We recommend signing up for the patient portal called "MyChart".  Sign up information is provided on this After Visit Summary.  MyChart is used to connect with patients for Virtual Visits (Telemedicine).  Patients are able to view lab/test results, encounter notes, upcoming appointments, etc.  Non-urgent messages can be sent to your provider as well.   To learn more about what you can do with MyChart, go to [email or web address].    Your next appointment:   3-4 month(s)  The format for your next appointment:   In Person  Provider:   Glenetta Hew, MD   Other Instructions Please keep your appointment with heart failure clinic.

## 2021-04-01 ENCOUNTER — Encounter: Payer: Self-pay | Admitting: Physician Assistant

## 2021-04-02 ENCOUNTER — Other Ambulatory Visit: Payer: Self-pay

## 2021-04-02 DIAGNOSIS — E785 Hyperlipidemia, unspecified: Secondary | ICD-10-CM

## 2021-04-02 NOTE — Progress Notes (Signed)
Liver function ok. Bad cholesterol (LDL) still not at goal of <70, refer to lipid clinic

## 2021-04-07 NOTE — Progress Notes (Signed)
Advanced Heart Failure Clinic Note   PCP: Elisabeth Cara, [name] PCP-Cardiologist: Glenetta Hew, MD  AHF: Dr. Haroldine Laws   Reason for Visit: F/u for Systolic Heart Failure/ Ischemic CM s/p Anterior STEMI   HPI:  65 y/o WF, long time heavy smoker, w/ COPD and h/o lung cancer treated w/ chemo and radiation, now in remission, systolic HF/ischemic CM.  She was admitted on 4/30 for acute anterior STEMI. Emergent LHC showed 99% pLAD occlusion treated w/ PCI + DES, also w/ 80% dLAD stenosis to be treated medically but no other coronary disease. Initial LVG at time of cath w/ mildly reduced LVEF, estimated ~45-50%. However 2D echo showed severely reduced LVEF, 25-30% w/ severe HK of the left ventricular, entire anteroseptal and anterior wall. No MR. RV normal. Small pericardial effusion.    She was placed on DAPT w/ ASA + Brilinta + high intensity statin + low dose ? blocker and SGLT2i (hgb A1c 5.4).    On 5/2, she became hypotensive w/ concerns for early developing CGS and AHF team was consulted. CO2 was 21, though Co-ox was ok at 68%. She was started on NE. Repeat limited echo showed no mechanical complications. RV was small. Small pericardial effusion also noted. CVP was low at 2.  Scr was normal at 0.77. Felt to be dry. Diuretics and [place] Glade were discontinued and she was given IVF boluses. She was able to tolerate low dose Toprol XL (added for PACs) and low dose spiro. She was discharged home on 5/5.   Returned back to the ED on 5/11 w/ diarrhea and weakness. Found to be severely hyponatremic w/ Na of 117 and hypokalemic at 2.8. Hypocalcemic 7.7 CO2 20. Hypotensive w/ SBPs 80s-90s. COVID +. She was admitted and started on IVF hydration w/ normal saline and started on K and Ca supplementation. Placed on Remdesivir for [place]. AHF team consulted. Underwent RHC showing low filling pressures w/ moderately reduced CO. Gentle IVF hydration recommended. No need for inotropic support. She was d/c to SNF  for rehab.   Seen in clinic 6/22 for f/u. Still at SNF.  Still reports poor balance but feels that she is getting a bit stronger w/ therapy. Stable NYHA II-III symptoms.  Today she returns for HF follow up. Overall feeling fine. Denies increasing SOB, CP, dizziness, edema, or PND/Orthopnea. Appetite ok. No fever or chills. Weight at home 170 pounds. Taking all medications. Smoking 2-4 ciggs/day.   Cardiac Studies: - RHC (5/22):   Findings:   RA = 2 RV = 17/2 PA = 17/4 (8) PCW = 4 Fick cardiac output/index = 3.0/2.4 Thermo CO/CI = 2.8/2.2 PVR = 1.3 Ao sat = 98% PA sat = 64%, 65%   Assessment: 1. Low filling pressures with moderately reduced cardiac output   Plan/Discussion:   Hydrate gently. No role for inotropic support at this time.   - Echo (5/22): EF 20-25%. RV normal. Moderate pericardial effusion no tamponade physiology   - LHC (5/22)  Diagnostic Dominance: Right    Intervention        Review of systems complete and found to be negative unless listed in HPI.    Past Medical History:  Diagnosis Date   Anxiety and depression    COPD (chronic obstructive pulmonary disease) ([place])    Long-term smoker greater than 35 years 1 to 2 pack a day.   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)    Current Outpatient Medications  Medication Sig Dispense Refill   aspirin 81 MG EC tablet Take 1 tablet (81 mg total) by mouth daily. Swallow whole. 30 tablet 11   atorvastatin (LIPITOR) 80 MG tablet Take 1 tablet (80 mg total) by mouth daily. 30 tablet 5   digoxin (LANOXIN) 0.125 MG tablet Take 1 tablet (0.125 mg total) by mouth daily. 30 tablet 0   furosemide (LASIX) 40 MG tablet Take 1 tablet (40 mg total) by mouth daily as  needed for fluid or edema (for weight gain more than 3 lbs in a day , leg edema). 30 tablet 11   midodrine (PROAMATINE) 5 MG tablet Take 1 tablet (5 mg total) by mouth 3 (three) times daily with meals. 90 tablet 0   nitroGLYCERIN (NITROSTAT) 0.4 MG SL tablet Place 1 tablet (0.4 mg total) under the tongue every 5 (five) minutes x 3 doses as needed for chest pain. 25 tablet 2   potassium chloride SA (KLOR-CON) 20 MEQ tablet Take 1 tablet (20 mEq total) by mouth daily as needed (when given lasix).     ticagrelor (BRILINTA) 90 MG TABS tablet Take 1 tablet (90 mg total) by mouth 2 (two) times daily. 60 tablet 5   No current facility-administered medications for this encounter.   Allergies  Allergen Reactions   Penicillins Hives   Social History   Socioeconomic History   Marital status: Divorced    Spouse name: Not on file   Number of children: 2   Years of education: Not on file   Highest education level: Not on file  Occupational History   Not on file  Tobacco Use   Smoking status: Every Day    Packs/day: 1.50    Years: 40.00    Pack years: 60.00    Types: Cigarettes   Smokeless tobacco: Never  Substance and Sexual Activity   Alcohol use: Not Currently   Drug use: Never   Sexual activity: Not on file  Other Topics Concern   Not on file  Social History Narrative   Currently divorced.  No longer working.  Previously worked in [employer].   Lives near [place].   Still smokes about 2 packs a day.   Social Determinants of Health   Financial Resource Strain: Low Risk    Difficulty of Paying Living Expenses: Not very hard  Food Insecurity: No Food Insecurity   Worried About Charity fundraiser in the Last Year: Never true   Ran Out of Food in the Last Year: Never true  Transportation Needs: No Transportation Needs   Lack of Transportation (Medical): No   Lack of Transportation (Non-Medical): No  Physical Activity: Not on file  Stress: Not on file  Social  Connections: Not on file  Intimate Partner Violence: Not on file   Family History  Problem Relation Age of Onset   Cancer Paternal Grandmother    Cancer Paternal Aunt    BP 120/76   Pulse 93   Wt 43.7 kg (96 lb 6.4 oz)   SpO2 99%   BMI 20.86 kg/m   Wt Readings from Last 3 Encounters:  04/08/21 43.7 kg (96 lb 6.4 oz)  03/30/21 42.1 kg (92 lb 12.8 oz)  03/09/21 40.5 kg (89 lb 3.2 oz)   PHYSICAL EXAM:  General:  NAD. No resp difficulty, thin in WC, kyphosis HEENT: Normal Neck: Supple. No JVD. Carotids 2+ bilat; no bruits. No lymphadenopathy or thryomegaly appreciated. Cor: PMI nondisplaced. Regular rate & rhythm. No rubs, gallops or murmurs. Lungs: Clear Abdomen: Soft, nontender, nondistended. No hepatosplenomegaly. No bruits or masses. Good bowel sounds. Extremities: No cyanosis, clubbing, rash, edema Neuro: Alert & oriented x 3, cranial nerves grossly intact. Moves all 4 extremities w/o difficulty. Affect pleasant.   ASSESSMENT & PLAN:  1. CAD  - s/p Anterior STEMI 01/2021  - cath w/ 99% pLAD occlusion treated w/ PCI/ DES + 80% dLAD to be treated medically. - stable w/o s/s of angina.  - c/w DAPT w/ ASA + Brilinta for a minimum of 1 year .  - c/w statin. LDL 106. She has been referred to Lipid Clinic.  - smoking cessation advised.    2. 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 - Echo LVEF 25-30%, w/ severe HK of the left ventricular, entire anteroseptal and anterior wall. No MR. RV normal.  - Limited Echo 02/13/21 EF 20-25% moderate pericardial effusion. No overt tamponade. - RHC 5/22 w/ low filling pressures w/ moderately reduced cardiac output - NYHA Class II-III, though not very active. Euvolemic on exam. Low BP has limited her ability to tolerate GDMT, she has required midodrine for BP support. - Will attempt to wean down midodrine to 2.5 tid (try low dose spiro vs losartan at next visit if BP allows) - Continue digoxin  0.0625 mg daily.  - Continue Lasix PRN.  - BMET and digoxin level today. - Will ask facility to check BP over next week or so. Can add low-dose losartan if BP stable.  3. H/o Lung Cancer - s/p chemo + radiation.  - Followed by oncology.  - Advised to quit smoking.   Follow up in 4-6 weeks with APP for further medication titration.  [place], FNP 04/08/21

## 2021-04-08 ENCOUNTER — Encounter (HOSPITAL_COMMUNITY): Payer: Self-pay

## 2021-04-08 ENCOUNTER — Ambulatory Visit (HOSPITAL_COMMUNITY)
Admission: RE | Admit: 2021-04-08 | Discharge: 2021-04-08 | Disposition: A | Discharge status: Home / Self Care | Payer: Medicaid Other | Source: Ambulatory Visit | Attending: Family Medicine | Admitting: Family Medicine

## 2021-04-08 ENCOUNTER — Other Ambulatory Visit: Payer: Self-pay

## 2021-04-08 VITALS — BP 120/76 | HR 93 | Wt 96.4 lb

## 2021-04-08 DIAGNOSIS — I25110 Atherosclerotic heart disease of native coronary artery with unstable angina pectoris: Secondary | ICD-10-CM | POA: Insufficient documentation

## 2021-04-08 DIAGNOSIS — I255 Ischemic cardiomyopathy: Secondary | ICD-10-CM | POA: Insufficient documentation

## 2021-04-08 DIAGNOSIS — I313 Pericardial effusion (noninflammatory): Secondary | ICD-10-CM | POA: Diagnosis not present

## 2021-04-08 DIAGNOSIS — Z9221 Personal history of antineoplastic chemotherapy: Secondary | ICD-10-CM | POA: Diagnosis not present

## 2021-04-08 DIAGNOSIS — I5022 Chronic systolic (congestive) heart failure: Secondary | ICD-10-CM | POA: Insufficient documentation

## 2021-04-08 DIAGNOSIS — Z85118 Personal history of other malignant neoplasm of bronchus and lung: Secondary | ICD-10-CM | POA: Insufficient documentation

## 2021-04-08 DIAGNOSIS — Z923 Personal history of irradiation: Secondary | ICD-10-CM | POA: Insufficient documentation

## 2021-04-08 DIAGNOSIS — E785 Hyperlipidemia, unspecified: Secondary | ICD-10-CM | POA: Diagnosis not present

## 2021-04-08 DIAGNOSIS — I252 Old myocardial infarction: Secondary | ICD-10-CM | POA: Diagnosis not present

## 2021-04-08 DIAGNOSIS — Z880 Allergy status to penicillin: Secondary | ICD-10-CM | POA: Insufficient documentation

## 2021-04-08 DIAGNOSIS — F17210 Nicotine dependence, cigarettes, uncomplicated: Secondary | ICD-10-CM | POA: Diagnosis not present

## 2021-04-08 LAB — BASIC METABOLIC PANEL
Anion gap: 10 (ref 5–15)
BUN: 7 mg/dL — ABNORMAL LOW (ref 8–23)
CO2: 25 mmol/L (ref 22–32)
Calcium: 9.4 mg/dL (ref 8.9–10.3)
Chloride: 93 mmol/L — ABNORMAL LOW (ref 98–111)
Creatinine, Ser: 0.65 mg/dL (ref 0.44–1.00)
GFR, Estimated: >60 mL/min (ref >60)
Glucose, Bld: 94 mg/dL (ref 70–99)
Potassium: 3.5 mmol/L (ref 3.5–5.1)
Sodium: 128 mmol/L — ABNORMAL LOW (ref 135–145)

## 2021-04-08 LAB — DIGOXIN LEVEL: Digoxin Level: 1 ng/mL (ref 0.8–2.0)

## 2021-04-08 MED ORDER — MIDODRINE HCL 5 MG PO TABS: 2.5000 mg | ORAL_TABLET | Freq: Three times a day (TID) | ORAL | 0 refills | Status: DC

## 2021-04-08 NOTE — Patient Instructions (Signed)
DECREASE Midodrine to 2.5mg  to three times daily  Routine lab work today. Will notify you of abnormal results  Please keep daily bp log and call in 10-14 days to report readings  Follow up in 4-6 weeks  Do the following things EVERYDAY: Weigh yourself in the morning before breakfast. Write it down and keep it in a log. Take your medicines as prescribed Eat low salt foods--Limit salt (sodium) to 2000 mg per day.  Stay as active as you can everyday Limit all fluids for the day to less than 2 liters

## 2021-04-13 ENCOUNTER — Telehealth (HOSPITAL_COMMUNITY): Payer: Self-pay | Admitting: Cardiology

## 2021-04-13 NOTE — Telephone Encounter (Signed)
-----   Message from Rafael Bihari, [place] sent at 04/08/2021  5:17 PM EDT ----- Digoxin level is elevated. Please hold for 2 days then decrease dose to 0.0625. Repeat dig level in 10-14 days please.

## 2021-04-16 NOTE — Telephone Encounter (Signed)
Order faxed to [facility] at [phone]

## 2021-04-28 ENCOUNTER — Ambulatory Visit: Payer: Medicaid Other

## 2021-04-28 ENCOUNTER — Telehealth: Payer: Self-pay

## 2021-04-28 NOTE — Progress Notes (Deleted)
Patient ID: TYAH ACORD                 DOB: Mar 03, 1956                    MRN: [id number]     HPI: Colleen Fleming is a 65 y.o. female patient referred to lipid clinic by Almyra Deforest. PMH is significant for CAD, STEMI, lunc cancer, emphysema, and tobacco abuse  Current Medications: atorvastatin 80mg  daily Intolerances:  Risk Factors: Cad, hx of STEMI, smoking LDL goal: <55  Diet:   Exercise:   Family History:   Social History:   Labs: TC 193, Trigs 123, HDL 65, LDL 106 (03/30/21 on atorvastatin 80)  Past Medical History:  Diagnosis Date   Anxiety and depression    COPD (chronic obstructive pulmonary disease) ([place])    Long-term smoker greater than 35 years 1 to 2 pack a day.   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 Swallow whole. 30 tablet 11   atorvastatin (LIPITOR) 80 MG tablet Take 1 tablet (80 mg total) by mouth daily. 30 tablet 5   digoxin (LANOXIN) 0.125 MG tablet Take 1 tablet (0.125 mg total) by mouth daily. 30 tablet 0   furosemide (LASIX) 40 MG tablet Take 1 tablet (40 mg total) by mouth daily as needed for fluid or edema (for weight gain more than 3 lbs in a day , leg edema). 30 tablet 11   midodrine (PROAMATINE) 5 MG tablet Take 0.5 tablets (2.5 mg total) by mouth 3 (three) times daily with meals. 90 tablet 0   nitroGLYCERIN (NITROSTAT) 0.4 MG SL tablet Place 1 tablet (0.4 mg total) under the tongue every 5 (five) minutes x 3 doses as needed for chest pain. 25 tablet 2   potassium chloride SA (KLOR-CON) 20 MEQ tablet Take 1 tablet (20 mEq total) by mouth daily as  needed (when given lasix).     ticagrelor (BRILINTA) 90 MG TABS tablet Take 1 tablet (90 mg total) by mouth 2 (two) times daily. 60 tablet 5   No current facility-administered medications on file prior to visit.    Allergies  Allergen Reactions   Penicillins Hives    Assessment/Plan:  1. Hyperlipidemia -

## 2021-04-28 NOTE — Telephone Encounter (Signed)
MISSED APPT  LMOM

## 2021-05-06 ENCOUNTER — Encounter (HOSPITAL_COMMUNITY): Payer: Self-pay

## 2021-05-06 ENCOUNTER — Telehealth (HOSPITAL_COMMUNITY): Payer: Self-pay

## 2021-05-06 NOTE — Telephone Encounter (Signed)
Attempted to call patient in regards to Cardiac Rehab - LM on VM Mailed letter 

## 2021-05-07 ENCOUNTER — Encounter (HOSPITAL_COMMUNITY): Payer: Medicaid Other

## 2021-05-11 ENCOUNTER — Telehealth (HOSPITAL_COMMUNITY): Payer: Self-pay | Admitting: Cardiology

## 2021-05-11 NOTE — Telephone Encounter (Signed)
Abnormal labs received from rehab Labs drawn 04/30/21 Digoxin level 0.6  Per Janett Billow [place],NP Level better repeat dig level in 10 days Order faxed to [facility] at [phone]

## 2021-05-13 ENCOUNTER — Other Ambulatory Visit (HOSPITAL_COMMUNITY): Payer: Self-pay | Admitting: Internal Medicine

## 2021-05-25 NOTE — Telephone Encounter (Signed)
No response from pt for cardiac rehab. Closed referral.

## 2021-08-16 ENCOUNTER — Ambulatory Visit (INDEPENDENT_AMBULATORY_CARE_PROVIDER_SITE_OTHER): Payer: Medicaid Other | Admitting: Cardiology

## 2021-08-16 ENCOUNTER — Encounter: Payer: Self-pay | Admitting: Cardiology

## 2021-08-16 ENCOUNTER — Other Ambulatory Visit: Payer: Self-pay

## 2021-08-16 VITALS — BP 108/70 | HR 93 | Ht <= 58 in | Wt 107.6 lb

## 2021-08-16 DIAGNOSIS — I9589 Other hypotension: Secondary | ICD-10-CM | POA: Diagnosis not present

## 2021-08-16 DIAGNOSIS — E871 Hypo-osmolality and hyponatremia: Secondary | ICD-10-CM

## 2021-08-16 DIAGNOSIS — I241 Dressler's syndrome: Secondary | ICD-10-CM

## 2021-08-16 DIAGNOSIS — I2102 ST elevation (STEMI) myocardial infarction involving left anterior descending coronary artery: Secondary | ICD-10-CM | POA: Diagnosis not present

## 2021-08-16 DIAGNOSIS — E785 Hyperlipidemia, unspecified: Secondary | ICD-10-CM

## 2021-08-16 DIAGNOSIS — E876 Hypokalemia: Secondary | ICD-10-CM

## 2021-08-16 DIAGNOSIS — I5042 Chronic combined systolic (congestive) and diastolic (congestive) heart failure: Secondary | ICD-10-CM

## 2021-08-16 DIAGNOSIS — I255 Ischemic cardiomyopathy: Secondary | ICD-10-CM

## 2021-08-16 DIAGNOSIS — I25110 Atherosclerotic heart disease of native coronary artery with unstable angina pectoris: Secondary | ICD-10-CM

## 2021-08-16 DIAGNOSIS — I959 Hypotension, unspecified: Secondary | ICD-10-CM

## 2021-08-16 HISTORY — DX: Hypotension, unspecified: I95.9

## 2021-08-16 HISTORY — DX: Ischemic cardiomyopathy: I25.5

## 2021-08-16 MED ORDER — MIDODRINE HCL 5 MG PO TABS: 2.5000 mg | ORAL_TABLET | Freq: Two times a day (BID) | ORAL | 3 refills | Status: DC

## 2021-08-16 MED ORDER — METOPROLOL SUCCINATE ER 25 MG PO TB24: 12.5000 mg | ORAL_TABLET | Freq: Every day | ORAL | 3 refills | Status: DC

## 2021-08-16 NOTE — Patient Instructions (Addendum)
Medication Instructions:   Stop taking Midodrine 2.5 mg three times a day and change to    Decrease Midodrine 2.5 mg  one tablet  twice a day   Start taking Metoprolol succinate  12.5 mg one tablet daily   *If you need a refill on your cardiac medications before your next appointment, please call your pharmacy*   Lab Work:  CMP LIPID  - fasting   If you have labs (blood work) drawn today and your tests are completely normal, you will receive your results only by: [place] (if you have MyChart) OR A paper copy in the mail If you have any lab test that is abnormal or we need to change your treatment, we will call you to review the results.   Testing/Procedures:  I will be schedule at [facility] [address]- late Nov or Dec 2022 Your physician has requested that you have an echocardiogram. Echocardiography is a painless test that uses sound waves to create images of your heart. It provides your doctor with information about the size and shape of your heart and how well your heart's chambers and valves are working. This procedure takes approximately one hour. There are no restrictions for this procedure.    Follow-Up: At [facility], you and your health needs are our priority.  As part of our continuing mission to provide you with exceptional heart care, we have created designated Provider Care Teams.  These Care Teams include your primary Cardiologist (physician) and Advanced Practice Providers (APPs -  Physician Assistants and Nurse Practitioners) who all work together to provide you with the care you need, when you need it.  We recommend signing up for the patient portal called "MyChart".  Sign up information is provided on this After Visit Summary.  MyChart is used to connect with patients for Virtual Visits (Telemedicine).  Patients are able to view lab/test results, encounter notes, upcoming appointments, etc.  Non-urgent messages can be sent to your provider as  well.   To learn more about what you can do with MyChart, go to [email or web address].    Your next appointment:   2 month(s) Dec 2022  The format for your next appointment:   In Person  Provider:   Glenetta Hew, MD  or [facility]

## 2021-08-16 NOTE — Progress Notes (Signed)
Primary Care Provider: Elisabeth Cara, [place] Cardiologist: Glenetta Hew, MD Electrophysiologist: None  Clinic Note: Chief Complaint  Patient presents with  . Follow-up    First MD follow-up presents MI-delayed planned follow-up.  . Coronary Artery Disease    Patient large anterior STEMI with resultant ischemic cardiomyopathy.  No angina  . Cardiomyopathy    Reduced EF after MI.     ===================================  ASSESSMENT/PLAN   Problem List Items Addressed This Visit       Cardiology Problems   Coronary artery disease involving native coronary artery of native heart with unstable angina pectoris (HCC) (Chronic)    Single-vessel CAD with a stent to the proximal LAD.  She did have some distal embolization down the apical LAD which was hopefully clear.  I wonder if it is possible that this lumbarization/slow reflow distally exacerbated her MI.  There was hope that some of the reduced EF was due to stunning, but several echocardiogram showed severely reduced EF.  Plan: Continue DAPT-for now at least we will continue a year uninterrupted.  (Aspirin and Brilinta) Continue high-dose high intensity statin-follow labs Today, I have not been able to start beta-blocker.  Pressure better now.  We will gradually wean down midodrine with attempts to initiate Toprol 12.5 mg daily followed by potentially ARB or spironolactone.      Relevant Medications   metoprolol succinate (TOPROL XL) 25 MG 24 hr tablet   midodrine (PROAMATINE) 5 MG tablet   Other Relevant Orders   EKG 12-Lead (Completed)   ECHOCARDIOGRAM COMPLETE   Lipid panel   Comprehensive metabolic panel   Chronic combined systolic and diastolic CHF, NYHA class 2 (HCC) (Chronic)    At baseline the patient has class II symptoms.  She has CHF in the but also has COPD and extremely deconditioned/weak.  This is contributing to her dyspnea as well.  Remains a skilled nursing facility and doing physical therapy.   Gradually gaining strength.  Relatively euvolemic on exam.  Mild puffy swelling as well I can see.  She sleeps on multiple pillows but this does not appear to be orthopnea.  No PND.  Not really doing a lot of walking but with what she does not having exertional dyspnea.  Seems relatively euvolemic on exam on low-dose furosemide which she is actually taking daily, has not taken any extra doses.  Plan: Reduce midodrine to 2.5 mg twice daily Initiate Toprol 12.5 mg daily with plans to add spironolactone 12.5 mg and close follow-up not able to tolerate this due to more potentially add ARB if pressures stabilize. She is on low-dose furosemide, difficult for skilled nursing facility to understand sliding scale, but the orders are there.  Does not appear that she is actually having to use any additional dosing based on exacerbation of dyspnea. Not on digoxin because of history of electrolyte derangement. Reassess EF by echo on the end of November early December.  Unfortunately, we were not able to really put her on very much stable regimen. She was not discharged with a LifeVest, and is well more than 3 months from her heart attack. => I do think, if she is on max tolerated medications and EF remains down she warrants referral consideration of ICD      Relevant Medications   metoprolol succinate (TOPROL XL) 25 MG 24 hr tablet   midodrine (PROAMATINE) 5 MG tablet   Other Relevant Orders   ECHOCARDIOGRAM COMPLETE   Pericardial effusion 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 ([place]) (Chronic)  Relatively large pericardial effusion noted 2 weeks after her MI.  I suspect that this is probably related a Dressler's type of inflammatory process.  Echo suggested initial findings of possible tamponade, but no clinical signs or symptoms of tamponade, and not confirmed by right heart cath.  She is not having any significant heart failure symptoms of PND orthopnea and no significant edema.  No signs of tamponade with  tachycardia or worsening hypotension.  In fact her blood pressure is better today.  Patient will be checking her echocardiogram (should have already been done, but has not been on any medications).  We will therefore reassess in late November to reassess EF and also effusion.      Relevant Medications   metoprolol succinate (TOPROL XL) 25 MG 24 hr tablet   midodrine (PROAMATINE) 5 MG tablet   Hyperlipidemia with target LDL less than 70 (Chronic)    Lipids drawn in June showed LDL of 106.  Target LDL should be less than 55 if not at least less than 70.  She had only been on statin for a month.  Continue current dose of statin and recheck lipids when she comes back in for her echocardiogram.  Can also check hemoglobin A1c, TSH and CBC as well as CMP.      Relevant Medications   metoprolol succinate (TOPROL XL) 25 MG 24 hr tablet   midodrine (PROAMATINE) 5 MG tablet   Other Relevant Orders   Lipid panel   Comprehensive metabolic panel   Acute ST elevation myocardial infarction (STEMI) involving left anterior descending (LAD) coronary artery (HCC) - Primary (Chronic)   Relevant Medications   metoprolol succinate (TOPROL XL) 25 MG 24 hr tablet   midodrine (PROAMATINE) 5 MG tablet   Other Relevant Orders   EKG 12-Lead (Completed)   Lipid panel   Comprehensive metabolic panel   Hypotension (arterial) (Chronic)    Blood pressure looks little better today.  Hopefully we can wean down midodrine, and initiate standard CHF meds..     Plan For now we will reduce midodrine down to 2.25 mg twice daily. ->  Will help with then titrate down to just simply PRN hypotension Ensure adequate hydration, when walking needs to use a walker; however, needs to continue with physical therapy to maintain strength. Continue to increase nutrition.        Relevant Medications   metoprolol succinate (TOPROL XL) 25 MG 24 hr tablet   midodrine (PROAMATINE) 5 MG tablet   Ischemic cardiomyopathy (Chronic)     Unfortunately, her post hospital follow-up has been suboptimal.  She was hospitalized once fairly shortly after her initial hospitalization and then had another ER visit.  Despite having APP follow-ups, she has not been able to have any medications to be titrated up, and remains on midodrine to keep her blood pressures stable.  Plan will be to wean down midodrine and start low-dose Toprol and spironolactone with plans to recheck echocardiogram in late November. No longer on digoxin because of her hospitalization for hyponatremia and hypokalemia.      Relevant Medications   metoprolol succinate (TOPROL XL) 25 MG 24 hr tablet   midodrine (PROAMATINE) 5 MG tablet   Other Relevant Orders   ECHOCARDIOGRAM COMPLETE     Other   Hypokalemia    Checking CHEM panel along with lipid panel.    Hope to add spironolactone which will help.      Hyponatremia    We will see him along with lipid panel      ===================================  HPI:    Colleen Fleming is a 65 y.o. female with a PMH notable for COPD, History of Lung Cancer (treated with chemoradiation, in remission), ICM with Chronic Combined Systolic/Diastolic Cardiomyopathy, and Baseline Hypotension on midodrine who presents today for delayed follow-up.  Recent Hospitalizations:  January 30, 2021: Acute anterior STEMI -> ostial and proximal LAD 90% thrombotic occlusion-DES PCI (Synergy DES 2.5 mm x 16 mm - 2.8 mm).  EF estimated 45% with a normal LVEDP ~ 13 mmHg & SBP 98 mmHg Echo with EF 25 to 30%, severe HK of LV anterior septal anterior wall. Advanced heart failure team consulted on 02/01/2021 because of hypotension and possible shock-started on norepinephrine.  No mechanical complication on echo. Felt to be dry.  Farxiga and diuretics discontinued and IV fluids given.  Norepinephrine weaned off,  Discharged on low-dose Toprol for PACs and low-dose spironolactone.   02/10/2021-admitted with diarrhea and weakness.  Noted to be  hyponatremic and hypokalemic.  Also hypocalcemic.  Was COVID-positive.  Hypotensive in the 80s.  Started on IVF hydration with normal saline and potassium supplementation.  Treated with remdesivir. Midodrine added and PICC line placed to monitor coags and CVP. ECHO - Large pericardial effusion, No clear evidence of tamponade- Right heart cath performed => Continued midodrine 10 mg 3 times daily, However no evidence of overt cardiogenic shock. Follow-up echo with EF of 20 to 25%.  NYHA class II-III heart failure. D/c to SNF -On aspirin, Brilinta, midodrine 10 mg 3 times daily, [place] 80 mg daily, digoxin 0.25 mg daily, Lasix 20 mg daily (with K-Dur 53mEq) for wgt > 95 lb. => No BB or Spironolactone 2/2 hypotension.   02/20/2021 - From SNF to ER for Fall: She noted that she was in her wheelchair attempting to bend for to stand up.  She fell down forward and suffered a laceration to the right side of her head.  No LOC.  She denied any syncope or near syncope leading to fall - just weak in general. No orthostatic dizziness.  => no acute findings.   Colleen Fleming was last seen on 03/09/2021 in [facility] by Ellen Henri, PA-> continue to have NYHA class II-III heart failure but euvolemic on exam.  Borderline low blood pressures-difficult to titrate medications.  Was on midodrine to keep blood pressure up.  Plan was to wean down to 5 mg 3 times daily in order to potentially initiate ARB/spironolactone.  Was on digoxin.  She was then seen by Almyra Deforest, PA on June 28.  Blood pressures were improved.  Weaned off midodrine. -> Provide letter for electric wheelchair.  Seen again on July 7 by Allena Katz, FNP in advanced heart failure clinic.  Midodrine was down to 2.5 mg TID and started low-dose spironolactone.  PRN Lasix.  Still smoking 2 to 4 cigarettes a day.Not willing to quit.   Reviewed  CV studies:    The following studies were reviewed today: (if available, images/films  reviewed: From Epic Chart or Care Everywhere) Left Heart Cath-PCI (Ant STEMI - 4/ 30/2022): 99% thrombotic subtotal occlusion of Prox LAD => (DES PCI LAD). Otherwise angiographically normal coronaries with exception of apical LAD occlusion with distal embolization.  EF estimated 45% w/ mid-apical anterior HK; ~ normal LVEDP with SBP 98 mmHg. Prox LAD 99% (-> 0%) DES PCI (Synergy DES 2.5 mm x 16 mm - 2.24mm) -> distal embolization with apical 80% occlusion       TTE 01/31/2021 (post Ant STEMI -PCI): EF  25-30%. Severe HK of entire Anterior/Anteroseptal wall & mild dyskinesis of anteroapical/apical segment. Gr 2 DD.  Normal RV size and function with mildly elevated.  RAP estimated 8 mm.  Small circumferential pericardial effusion.  Normal aortic and mitral valves.   TTE (Limited) 02/01/2021: Small to moderate pericardial effusion primarily at RV.  IVC collapses around 50%.  No overt pericardial tamponade.  Normal RV.  Normal RAP.  TTE 02/13/2021: EF 20 - 25%-severely reduced function.  Anteroseptal, apical and distal inferior wall akinesis.  Large predominantly anterior pericardial effusion; respiratory flow variation across MV and RA collapse suggestive of early tamponade physiology but no RV diastolic collapse and IVC not dilated.  Effusion larger.  RAP 3 mm. Right Heart Cath 02/15/2021: RA = 2 mmHg; RV = 17/2 mmHg, PA = 17/4 (8) mmHg; PCW = 4 mmHg;   Ao sat = 98%, PA sat = 64%, 65%Fick Cardiac Output/Index = 3.0/2.4; Thermo CO/CI = 2.8/2.2; PVR = 1.3 WU * Low filling pressures with moderately reduced cardiac output  Interval History:   Colleen Fleming presents here today for her first visit with me since her hospitalization.  She is accompanied by her son, who is her primary caregiver when she is not at the SNF.  He is also the one who ensures that she is on appropriate medications.  She is in a wheelchair, and has been asking about having an IT trainer wheelchair.  However she is working with physical therapy  and says she is getting stronger.  Able to walk a little bit, still do much.  Apparently, this is just an exacerbation of how she had been prior to her MI, as a result of her lung cancer. She has become profoundly weak and not able to hold her self up.  Her blood pressures been very low.  She has very poor balance, And would not walk without a walker.  Her blood pressures have still been low, but are little better now.  She denies any symptoms of PND or orthopnea.  No edema.  No rest or exertional chest pain or pressure with the physical therapy she is doing.  She sleeps on 3 pillows but that dates back to many years ago, more for comfort.  Still smoking about 4 to 6 cigarettes a day.  No interest in quitting.  She herself does not have a story.  Her son has to help out a lot helping her remember which she is taking and what she is doing.  She seems to be quite forgetful.  CV Review of Symptoms (Summary) Cardiovascular ROS: positive for - - Very rare skipped beats, but nothing prolonged.  Sleeps on 3 pillows, but not really orthopnea, exertional dyspnea because of deconditioning and profound weakness.  Getting stronger; Orthostatic-dizzy with standing, but also has vertigo. negative for - chest pain, edema, irregular heartbeat, orthopnea, palpitations, paroxysmal nocturnal dyspnea, rapid heart rate, shortness of breath, or Syncope or near syncope although she does get lightheaded with orthostasis; TIA/amaurosis fugax, claudication.  REVIEWED OF SYSTEMS   Review of Systems  Constitutional:  Positive for malaise/fatigue (Very weak and tired.  Extremely deconditioned.). Negative for weight loss (Thankfully, she has gained back quite a bit of the weight she had lost during her illnesses.  Getting stronger.).  HENT:  Positive for hearing loss. Negative for congestion and nosebleeds.   Respiratory:  Positive for cough (Chronic), sputum production (Mostly in the morning), shortness of breath and wheezing.         All the  symptoms seem to have predated her MI, but she is just overall more short of breath at baseline.  Cardiovascular:  Negative for chest pain, palpitations and leg swelling.  Gastrointestinal:  Negative for abdominal pain, blood in stool, constipation and melena.  Genitourinary:  Negative for frequency and hematuria.  Musculoskeletal:  Positive for back pain, falls (Not in the last 2 to 3 weeks.) and joint pain. Negative for myalgias.  Neurological:  Positive for dizziness, focal weakness (Mostly her legs) and weakness (Profound generalized). Negative for loss of consciousness and headaches.  Psychiatric/Behavioral:  Positive for memory loss. Negative for depression (Not true depression, does have symptoms concerning for dysthymia with anhedonia, but improving.) and hallucinations. The patient is not nervous/anxious.    I have reviewed and (if needed) personally updated the patient's problem list, medications, allergies, past medical and surgical history, social and family history.   PAST MEDICAL HISTORY   Past Medical History:  Diagnosis Date  . Acute ST elevation myocardial infarction (STEMI) involving left anterior descending (LAD) coronary artery ([place]) 01/30/2021   99% thrombotic subtotal occlusion of Prox LAD => (DES PCI LAD).  apical LAD occlusion with distal embolization.  EF estimated 40-45% w/ mid-apical anterior HK; ~ normal LVEDP with SBP 98 mmHg. => Echo EF 20 to 25% with entire anterior wall hypokinesis and apical akinesis.  . Anxiety and depression   . Chronic combined systolic and diastolic CHF, NYHA class 2 ([place]) 02/13/2021   EF 25%.  GR 2 DD.  Marland Kitchen COPD (chronic obstructive pulmonary disease) (HCC)    Long-term smoker greater than 35 years 1 to 2 pack a day.  . Coronary artery disease involving native coronary artery of native heart with unstable angina pectoris ([place])    Presented with anterior STEMI-99% subtotal occluded proximal LAD-DES PCI (Synergy DES 2.5 mm 60 mm -  2.8 mm) distally apical thromboembolism.  Reduced EF with anterior and apical hypokinesis.  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 GERD (gastroesophageal reflux disease)   . History of seizure disorder   . Hyperlipidemia   . Hyperlipidemia with target LDL less than 70 11/25/2011  . Hypotension (arterial) 08/16/2021   Notably hypotensive during index hospitalization anterior STEMI-CHF.  On midodrine for blood pressure support.  Previously not able to tolerate beta-blocker or other CHF medications.  . Ischemic cardiomyopathy 08/16/2021   EF 20 to 25% following anterior STEMI, despite LAD PCI.Marland Kitchen  Unable to titrate medications due to hypotension.  . Obesity   . Panlobular emphysema ([place])   . Small cell lung cancer, left upper lobe ([place]) 2014   Treated with radiation and chemotherapy [facility])    PAST SURGICAL HISTORY   Past Surgical History:  Procedure Laterality Date  . CORONARY/GRAFT ACUTE MI REVASCULARIZATION N/A 01/30/2021   Procedure: Coronary/Graft Acute MI Revascularization;  Surgeon: Leonie Man, MD;  Location: [facility];  Service: Cardiovascular; Prox LAD 99% (-> 0%) DES PCI (Synergy DES 2.5 mm x 16 mm - 2.29mm) -> distal embolization with apical 80% occlusion  . LEFT HEART CATH AND CORONARY ANGIOGRAPHY N/A 01/30/2021   Procedure: LEFT HEART CATH AND CORONARY ANGIOGRAPHY;  Surgeon: Leonie Man, MD;  Location: [facility];  Service: Cardiovascular; ANT STEMI: 99% thrombotic subtotal occlusion of Prox LAD => (DES PCI LAD). Otherwise angiographically normal coronaries with exception of apical LAD occlusion with distal embolization.  EF estimated 45% w/ mid-apical anterior HK; ~ nl LVEDP w/  SBP 98 mmHg.  Marland Kitchen RIGHT HEART CATH N/A 02/15/2021   Procedure: RIGHT HEART CATH;  Surgeon: Jolaine Artist, MD;  Location: [facility];  Service: CardiovRA = 2 mmHg; RV = 17/2 mmHg, PA = 17/4 (8) mmHg; PCW = 4 mmHg;   Ao sat =  98%, PA sat = 64%, 65%Fick Cardiac Output/Index = 3.0/2.4; Thermo CO/CI = 2.8/2.2; PVR = 1.3 WUascular;  . TRANSTHORACIC ECHOCARDIOGRAM  01/31/2021   (post Ant STEMI -PCI): EF 25-30%. Severe HK of entire Anterior/Anteroseptal wall & mild dyskinesis of anteroapical/apical segment. Gr 2 DD.  Normal RV size and function with mildly elevated.  RAP estimated 8 mm.  Small circumferential pericardial effusion.  Normal aortic and mitral valves.  . TRANSTHORACIC ECHOCARDIOGRAM  02/01/2021   a) Limited: small-mod pericardial effusion primarily anterior to RV. IVC collapses ~50%. Nl RV & RAP. NO OVERT TAMPONADE; b) 5/14: Large, predominantly anterior pericardial effusion -> findings suggestive of early tamponade physiology, but no RV diastolic collapse and IVC not dilated.  RAP 3 mmHg. = EFFUSION LARGER, no TAMPONADE    Immunization History  Administered Date(s) Administered  . Pneumococcal Polysaccharide-23 02/04/2021  . Tdap 02/20/2021    MEDICATIONS/ALLERGIES   Current Meds  Medication Sig  . atorvastatin (LIPITOR) 80 MG tablet Take 1 tablet (80 mg total) by mouth daily.  . furosemide (LASIX) 40 MG tablet Take 1 tablet (40 mg total) by mouth daily as needed for fluid or edema (for weight gain more than 3 lbs in a day , leg edema).  . metoprolol succinate (TOPROL XL) 25 MG 24 hr tablet Take 0.5 tablets (12.5 mg total) by mouth daily.  . nitroGLYCERIN (NITROSTAT) 0.4 MG SL tablet Place 1 tablet (0.4 mg total) under the tongue every 5 (five) minutes x 3 doses as needed for chest pain.  . potassium chloride SA (KLOR-CON) 20 MEQ tablet Take 1 tablet (20 mEq total) by mouth daily as needed (when given lasix).  . ticagrelor (BRILINTA) 90 MG TABS tablet Take 1 tablet (90 mg total) by mouth 2 (two) times daily.  . [DISCONTINUED] midodrine (PROAMATINE) 5 MG tablet Take 0.5 tablets (2.5 mg total) by mouth 3 (three) times daily with meals.    Allergies  Allergen Reactions  . Penicillins Hives    SOCIAL  HISTORY/FAMILY HISTORY   Reviewed in Epic:  Pertinent findings:  Social History   Tobacco Use  . Smoking status: Every Day    Packs/day: 1.50    Years: 40.00    Pack years: 60.00    Types: Cigarettes  . Smokeless tobacco: Never  Substance Use Topics  . Alcohol use: Not Currently  . Drug use: Never   Social History   Social History Narrative   Currently divorced.  No longer working.  Previously worked in [employer].   Lives near [place].   Still smokes about 2 packs a day.    OBJCTIVE -PE, EKG, labs   Wt Readings from Last 3 Encounters:  08/16/21 107 lb 9.6 oz (48.8 kg)  04/08/21 96 lb 6.4 oz (43.7 kg)  03/30/21 92 lb 12.8 oz (42.1 kg)    Physical Exam: BP 108/70 (BP Location: Right Arm, Patient Position: Sitting, Cuff Size: Normal)   Pulse 93   Ht 4\' 9"  (1.448 m)   Wt 107 lb 9.6 oz (48.8 kg)   SpO2 97%   BMI 23.28 kg/m  Physical Exam Vitals reviewed.  Constitutional:      General: She is not in acute  distress.    Appearance: She is ill-appearing (Chronically ill-appearing). She is not toxic-appearing.     Comments: Not emaciated, thin and frail woman.  Appears much older than stated age.  Well-groomed.  Sitting in a wheelchair.  Very poor historian.  HENT:     Head: Normocephalic and atraumatic.     Ears:     Comments: Very hard of hearing Eyes:     Extraocular Movements: Extraocular movements intact.     Pupils: Pupils are equal, round, and reactive to light.  Cardiovascular:     Rate and Rhythm: Normal rate and regular rhythm. Occasional Extrasystoles are present.    Chest Wall: PMI is displaced (Somewhat laterally displaced, not sustained).     Pulses: Decreased pulses (Diffuse, palpable pedal pulses).     Heart sounds: S1 normal and S2 normal. No murmur heard.   No friction rub. Gallop present. S4 sounds present.  Pulmonary:     Effort: Pulmonary effort is normal. No respiratory distress.     Breath sounds: No rales.     Comments:  Diffuse coarse breath sounds but clear somewhat with cough.  No overt wheezing or rhonchi. Chest:     Chest wall: Tenderness (Significantly reproducible chest discomfort along both sternal borders.) present.  Abdominal:     General: Abdomen is flat. Bowel sounds are normal. There is no distension.     Palpations: Abdomen is soft. There is no mass.  Musculoskeletal:        General: No swelling (Trivial bilateral ankle swelling.).  Skin:    General: Skin is warm and dry.     Coloration: Skin is not jaundiced or pale.  Neurological:     General: No focal deficit present.     Mental Status: She is alert and oriented to person, place, and time.     Motor: Weakness present.     Coordination: Coordination abnormal.     Gait: Gait abnormal.  Psychiatric:        Mood and Affect: Mood normal.     Comments: Poor historian.  Has difficulty with memory recall.  Some of this may be related to being hard of hearing.  Not quite sure of her insight and judgment.     Adult ECG Report  Rate: 93;  Rhythm: normal sinus rhythm and Low voltage, septal infarct, age-indeterminate-Q waves with biphasic ST and T wave changes with T wave versions in V1 and V2.  Otherwise normal axis, intervals and durations. ;   Narrative Interpretation: Stable  Recent Labs: Unfortunately, has not had labs checked in several months. Lab Results  Component Value Date   CHOL 193 03/30/2021   HDL 65 03/30/2021   LDLCALC 106 (H) 03/30/2021   TRIG 123 03/30/2021   CHOLHDL 3.0 03/30/2021   Lab Results  Component Value Date   CREATININE 0.65 04/08/2021   BUN 7 (L) 04/08/2021   NA 128 (L) 04/08/2021   K 3.5 04/08/2021   CL 93 (L) 04/08/2021   CO2 25 04/08/2021   CBC Latest Ref Rng & Units 02/20/2021 02/15/2021 02/15/2021  WBC 4.0 - 10.5 K/uL 7.8 - -  Hemoglobin 12.0 - 15.0 g/dL 11.8(L) 11.6(L) 11.6(L)  Hematocrit 36.0 - 46.0 % 35.1(L) 34.0(L) 34.0(L)  Platelets 150 - 400 K/uL 330 - -    Lab Results  Component Value Date    HGBA1C 5.4 01/31/2021   Lab Results  Component Value Date   TSH 1.140 02/10/2021    ==================================================  COVID-19 Education: The signs and  symptoms of COVID-19 were discussed with the patient and how to seek care for testing (follow up with PCP or arrange E-visit).    I spent a total of 75minutes with the patient spent in direct patient consultation.  Additional time spent with chart review  / charting (studies, outside notes, etc): 45 min => Multiple echocardiogram report as well as cath report and clinic notes reviewed.  I personally reviewed her echocardiogram findings/images. Total Time: 96 min  Current medicines are reviewed at length with the patient today.  (+/- concerns) N/A  This visit occurred during the SARS-CoV-2 public health emergency.  Safety protocols were in place, including screening questions prior to the visit, additional usage of staff PPE, and extensive cleaning of exam room while observing appropriate contact time as indicated for disinfecting solutions.  Notice: This dictation was prepared with Dragon dictation along with smart phrase technology. Any transcriptional errors that result from this process are unintentional and may not be corrected upon review.  Patient Instructions / Medication Changes & Studies & Tests Ordered   Patient Instructions  Medication Instructions:   Stop taking Midodrine 2.5 mg three times a day and change to    Decrease Midodrine 2.5 mg  one tablet  twice a day   Start taking Metoprolol succinate  12.5 mg one tablet daily   *If you need a refill on your cardiac medications before your next appointment, please call your pharmacy*   Lab Work:  CMP LIPID  - fasting   If you have labs (blood work) drawn today and your tests are completely normal, you will receive your results only by: MyChart Message (if you have MyChart) OR A paper copy in the mail If you have any lab test that is abnormal or  we need to change your treatment, we will call you to review the results.   Testing/Procedures:  I will be schedule at [facility] [address]- late Nov or Dec 2022 Your physician has requested that you have an echocardiogram. Echocardiography is a painless test that uses sound waves to create images of your heart. It provides your doctor with information about the size and shape of your heart and how well your heart's chambers and valves are working. This procedure takes approximately one hour. There are no restrictions for this procedure.    Follow-Up: At [facility], you and your health needs are our priority.  As part of our continuing mission to provide you with exceptional heart care, we have created designated Provider Care Teams.  These Care Teams include your primary Cardiologist (physician) and Advanced Practice Providers (APPs -  Physician Assistants and Nurse Practitioners) who all work together to provide you with the care you need, when you need it.  We recommend signing up for the patient portal called "MyChart".  Sign up information is provided on this After Visit Summary.  MyChart is used to connect with patients for Virtual Visits (Telemedicine).  Patients are able to view lab/test results, encounter notes, upcoming appointments, etc.  Non-urgent messages can be sent to your provider as well.   To learn more about what you can do with MyChart, go to [email or web address].    Your next appointment:   2 month(s) Dec 2022  The format for your next appointment:   In Person  Provider:   Glenetta Hew, MD  or [facility]        Studies Ordered:   Orders Placed This Encounter  Procedures  . Lipid panel  .  Comprehensive metabolic panel  . EKG 12-Lead  . ECHOCARDIOGRAM COMPLETE      Glenetta Hew, M.D., M.S. Interventional Cardiologist   Pager # [phone] Phone # [phone] [address] [place], [place] [address]   Thank you for  choosing [facility] at [facility]!!

## 2021-08-18 ENCOUNTER — Encounter: Payer: Self-pay | Admitting: Cardiology

## 2021-08-18 NOTE — Assessment & Plan Note (Signed)
Lipids drawn in June showed LDL of 106.  Target LDL should be less than 55 if not at least less than 70.  She had only been on statin for a month.  Continue current dose of statin and recheck lipids when she comes back in for her echocardiogram.  Can also check hemoglobin A1c, TSH and CBC as well as CMP.

## 2021-08-18 NOTE — Assessment & Plan Note (Signed)
At baseline the patient has class II symptoms.  She has CHF in the but also has COPD and extremely deconditioned/weak.  This is contributing to her dyspnea as well.  Remains a skilled nursing facility and doing physical therapy.  Gradually gaining strength.  Relatively euvolemic on exam.  Mild puffy swelling as well I can see.  She sleeps on multiple pillows but this does not appear to be orthopnea.  No PND.  Not really doing a lot of walking but with what she does not having exertional dyspnea.  Seems relatively euvolemic on exam on low-dose furosemide which she is actually taking daily, has not taken any extra doses.  Plan:  Reduce midodrine to 2.5 mg twice daily  Initiate Toprol 12.5 mg daily with plans to add spironolactone 12.5 mg and close follow-up not able to tolerate this due to more potentially add ARB if pressures stabilize.  She is on low-dose furosemide, difficult for skilled nursing facility to understand sliding scale, but the orders are there.  Does not appear that she is actually having to use any additional dosing based on exacerbation of dyspnea.  Not on digoxin because of history of electrolyte derangement.  Reassess EF by echo on the end of November early December.  Unfortunately, we were not able to really put her on very much stable regimen.  She was not discharged with a LifeVest, and is well more than 3 months from her heart attack. => I do think, if she is on max tolerated medications and EF remains down she warrants referral consideration of ICD

## 2021-08-18 NOTE — Assessment & Plan Note (Signed)
Single-vessel CAD with a stent to the proximal LAD.  She did have some distal embolization down the apical LAD which was hopefully clear.  I wonder if it is possible that this lumbarization/slow reflow distally exacerbated her MI.  There was hope that some of the reduced EF was due to stunning, but several echocardiogram showed severely reduced EF.  Plan:  Continue DAPT-for now at least we will continue a year uninterrupted.  (Aspirin and Brilinta)  Continue high-dose high intensity statin-follow labs  Today, I have not been able to start beta-blocker.  Pressure better now.  We will gradually wean down midodrine with attempts to initiate Toprol 12.5 mg daily followed by potentially ARB or spironolactone.

## 2021-08-18 NOTE — Assessment & Plan Note (Signed)
Checking CHEM panel along with lipid panel.    Hope to add spironolactone which will help.

## 2021-08-18 NOTE — Assessment & Plan Note (Addendum)
Blood pressure looks little better today.  Hopefully we can wean down midodrine, and initiate standard CHF meds..     Plan  For now we will reduce midodrine down to 2.25 mg twice daily. ->  Will help with then titrate down to just simply PRN hypotension  Ensure adequate hydration, when walking needs to use a walker; however, needs to continue with physical therapy to maintain strength.  Continue to increase nutrition.

## 2021-08-18 NOTE — Assessment & Plan Note (Signed)
Unfortunately, her post hospital follow-up has been suboptimal.  She was hospitalized once fairly shortly after her initial hospitalization and then had another ER visit.  Despite having APP follow-ups, she has not been able to have any medications to be titrated up, and remains on midodrine to keep her blood pressures stable.   Plan will be to wean down midodrine and start low-dose Toprol and spironolactone with plans to recheck echocardiogram in late November.  No longer on digoxin because of her hospitalization for hyponatremia and hypokalemia.

## 2021-08-18 NOTE — Assessment & Plan Note (Signed)
Relatively large pericardial effusion noted 2 weeks after her MI.  I suspect that this is probably related a Dressler's type of inflammatory process.  Echo suggested initial findings of possible tamponade, but no clinical signs or symptoms of tamponade, and not confirmed by right heart cath.  She is not having any significant heart failure symptoms of PND orthopnea and no significant edema.  No signs of tamponade with tachycardia or worsening hypotension.  In fact her blood pressure is better today.  Patient will be checking her echocardiogram (should have already been done, but has not been on any medications).  We will therefore reassess in late November to reassess EF and also effusion.

## 2021-08-18 NOTE — Assessment & Plan Note (Signed)
We will see him along with lipid panel

## 2021-08-23 ENCOUNTER — Encounter (INDEPENDENT_AMBULATORY_CARE_PROVIDER_SITE_OTHER): Payer: Self-pay

## 2021-08-23 ENCOUNTER — Ambulatory Visit (HOSPITAL_COMMUNITY): Payer: Medicaid Other | Attending: Cardiovascular Disease

## 2021-08-23 ENCOUNTER — Other Ambulatory Visit: Payer: Self-pay

## 2021-08-23 DIAGNOSIS — I255 Ischemic cardiomyopathy: Secondary | ICD-10-CM | POA: Insufficient documentation

## 2021-08-23 DIAGNOSIS — I25110 Atherosclerotic heart disease of native coronary artery with unstable angina pectoris: Secondary | ICD-10-CM | POA: Diagnosis present

## 2021-08-23 DIAGNOSIS — I5042 Chronic combined systolic (congestive) and diastolic (congestive) heart failure: Secondary | ICD-10-CM | POA: Diagnosis present

## 2021-08-23 LAB — ECHOCARDIOGRAM COMPLETE
Area-P 1/2: 4.6 cm2
S' Lateral: 2.3 cm

## 2021-08-31 ENCOUNTER — Telehealth: Payer: Self-pay | Admitting: *Deleted

## 2021-08-31 NOTE — Telephone Encounter (Signed)
The patient  and son [name] has been notified of the result and verbalized understanding.  All questions (if any) were answered. Aware to keep appointment 09/21/21  Raiford Simmonds, RN 08/31/2021 12:05 PM

## 2021-08-31 NOTE — Telephone Encounter (Signed)
-----   Message from Leonie Man, MD sent at 08/23/2021  6:00 PM EST ----- Follow-up echocardiogram shows an ejection fraction of 30 to 35% with moderate dysfunction.  Only grade 1 diastolic dysfunction.  The distal anterior wall and anteroseptal wall are akinetic-negative..  The left ventricle is still mildly dilated with only grade 1 diastolic dysfunction.  Overall, there has been a small amount of improvement in function increasing from 20 to 25% up to 30 to 35%.  It is still in the dangerous range for potential abnormal rhythms etc.  We can discuss that in follow-up.  Glenetta Hew

## 2021-08-31 NOTE — Telephone Encounter (Signed)
Spoke to patient - information given . Patient request  Rn call and speak to  her Son [name] .patient states he is her caregiver. RN will son as requested.

## 2021-09-20 NOTE — Progress Notes (Signed)
Cardiology Office Note:    Date:  09/21/2021   ID:  Colleen, Fleming 1955/11/17, MRN [id number]  PCP:  Elisabeth Cara, [name]   [facility] Providers Cardiologist:  Glenetta Hew, MD     Referring MD: Belva Bertin, [place], *   Chief Complaint: 1 month f/u HFrEF  History of Present Illness:    Colleen Fleming is a 65 y.o. female with a hx of CAD s/p DES to LAD, ischemic cardiomyopathy, MI, chronic combined CHF NYHA Class 2, hyperlipidemia, COPD, pericardial effusion.   She was initially admitted on 01/30/21 with acute anterior STEMI and cardiac cath revealed 99% proximal LAD occlusion treated with DES, 80% LAD lesion treated medically, and no other significant CAD. LV gram on initial caht showed EF 45-50%, however follow-up echo revealed severely reduced LVEF of 25-30% with wall motion abnormality in anterior wall, small pericardial effusion. Hospital course was complicated by hypotension requiring norepi. She was felt to be dry and was treated with IV fluid. Shortly after d/c, she returned to the hospital on 02/10/21 with diarrhea and weakness. She was found to be hypokalemic with potassium of 2.8, sodium was 117, calcium was 7.7. She also tested positive for Covid. She was hospitalized and managed on midodrine and IV fluid. Repeat echo on 02/13/21 showed continued reduced LVEF of 20-25%, moderate pericardial effusion, no overt tamponade. Right heart cath on 02/15/21 revealed low filling pressure with moderately reduced cardiac output. Heart failure medication titration was limited by low BP and volume depletion. She was placed on digoxin and beta blocker was stopped. On 02/20/21 she was back in the ED after lose of balance, falling forward and sustaining a head contusion. She was seen in CHF clinic but has not maintained consistent follow-up. Digoxin was stopped due to hyponatremia and hypokalemia.   She was last seen in our office on 08/16/21 by Dr. Ellyn Hack. He reduced her midodrine dose  to 2.5 mg twice daily and added metoprolol succinate 12.5 mg daily. Repeat echo was ordered and revealed LVEF 30-35% with distal anterior wall and anteroseptal wall akinesis, G1DD.  Today, she is here with her son, Colleen Fleming. She is in a wheelchair and is still living at [facility]. She reports that she is ready to go to her son's house and that she has been cleared by physical therapy but her son states she can only walk a short distance with the walker. [name] states she has difficulty getting into and out of the car without assistance. She brings her medication list from Colleen Fleming which indicates that she receives midodrine 2.5 mg twice daily for SBP < 125 mmHg. She also has digoxin on her med list. They did not provide any BP readings from the facility. She denies chest pain, shortness of breath, lower extremity edema, fatigue, palpitations, melena, hematuria, hemoptysis, diaphoresis, presyncope, syncope, orthopnea, and PND.   Past Medical History:  Diagnosis Date   Acute ST elevation myocardial infarction (STEMI) involving left anterior descending (LAD) coronary artery ([place]) 01/30/2021   99% thrombotic subtotal occlusion of Prox LAD => (DES PCI LAD).  apical LAD occlusion with distal embolization.  EF estimated 40-45% w/ mid-apical anterior HK; ~ normal LVEDP with SBP 98 mmHg. => Echo EF 20 to 25% with entire anterior wall hypokinesis and apical akinesis.   Anxiety and depression    Chronic combined systolic and diastolic CHF, NYHA class 2 ([place]) 02/13/2021   EF 25%.  GR 2 DD.   COPD (chronic obstructive pulmonary disease) ([place])  Long-term smoker greater than 35 years 1 to 2 pack a day.   Coronary artery disease involving native coronary artery of native heart with unstable angina pectoris ([place])    Presented with anterior STEMI-99% subtotal occluded proximal LAD-DES PCI (Synergy DES 2.5 mm 60 mm - 2.8 mm) distally apical thromboembolism.  Reduced EF with anterior and apical hypokinesis.   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  On midodrine for blood pressure support.  Previously not able to tolerate beta-blocker or other CHF medications.   Ischemic cardiomyopathy 08/16/2021   EF 20 to 25% following anterior STEMI, despite LAD PCI.Marland Kitchen  Unable to titrate medications due to hypotension.   Obesity    Panlobular emphysema (HCC)    Small cell lung cancer, left upper lobe ([place]) 2014   Treated with radiation and chemotherapy [facility])    Past Surgical History:  Procedure Laterality Date   CORONARY/GRAFT ACUTE MI REVASCULARIZATION N/A 01/30/2021   Procedure: Coronary/Graft Acute MI Revascularization;  Surgeon: Leonie Man, MD;  Location: [place] [facility];  Service: Cardiovascular; Prox LAD 99% (-> 0%) DES PCI (Synergy DES 2.5 mm x 16 mm - 2.49mm) -> distal embolization with apical 80% occlusion   LEFT HEART CATH AND CORONARY ANGIOGRAPHY N/A 01/30/2021   Procedure: LEFT HEART CATH AND CORONARY ANGIOGRAPHY;  Surgeon: Leonie Man, MD;  Location: [place] [facility];  Service: Cardiovascular; ANT STEMI: 99% thrombotic subtotal occlusion of Prox LAD => (DES PCI LAD). Otherwise angiographically normal coronaries with exception of apical LAD occlusion with distal embolization.  EF estimated 45% w/ mid-apical anterior HK; ~ nl LVEDP w/  SBP 98 mmHg.   RIGHT HEART CATH N/A 02/15/2021   Procedure: RIGHT HEART CATH;  Surgeon: Jolaine Artist, MD;  Location: [place] [facility];  Service: CardiovRA = 2 mmHg; RV = 17/2 mmHg, PA = 17/4 (8) mmHg; PCW = 4 mmHg;   Ao sat = 98%, PA sat = 64%, 65%Fick Cardiac Output/Index = 3.0/2.4; Thermo CO/CI = 2.8/2.2; PVR = 1.3 [employer];   TRANSTHORACIC ECHOCARDIOGRAM   01/31/2021   (post Ant STEMI -PCI): EF 25-30%. Severe HK of entire Anterior/Anteroseptal wall & mild dyskinesis of anteroapical/apical segment. Gr 2 DD.  Normal RV size and function with mildly elevated.  RAP estimated 8 mm.  Small circumferential pericardial effusion.  Normal aortic and mitral valves.   TRANSTHORACIC ECHOCARDIOGRAM  02/01/2021   a) Limited: small-mod pericardial effusion primarily anterior to RV. IVC collapses ~50%. Nl RV & RAP. NO OVERT TAMPONADE; b) 5/14: Large, predominantly anterior pericardial effusion -> findings suggestive of early tamponade physiology, but no RV diastolic collapse and IVC not dilated.  RAP 3 mmHg. = EFFUSION LARGER, no TAMPONADE    Current Medications: Current Meds  Medication Sig   atorvastatin (LIPITOR) 80 MG tablet Take 1 tablet (80 mg total) by mouth daily.   Digoxin 62.5 MCG TABS Take 62.5 mg by mouth daily.   furosemide (LASIX) 40 MG tablet Take 1 tablet (40 mg total) by mouth daily as needed for fluid or edema (for weight gain more than 3 lbs in a day , leg edema).   metoprolol succinate (TOPROL XL) 25 MG 24 hr tablet Take 0.5 tablets (12.5  mg total) by mouth daily.   nitroGLYCERIN (NITROSTAT) 0.4 MG SL tablet Place 1 tablet (0.4 mg total) under the tongue every 5 (five) minutes x 3 doses as needed for chest pain.   potassium chloride SA (KLOR-CON) 20 MEQ tablet Take 1 tablet (20 mEq total) by mouth daily as needed (when given lasix).   ticagrelor (BRILINTA) 90 MG TABS tablet Take 1 tablet (90 mg total) by mouth 2 (two) times daily.   [DISCONTINUED] midodrine (PROAMATINE) 5 MG tablet Take 0.5 tablets (2.5 mg total) by mouth 2 (two) times daily with a meal.     Allergies:   Penicillins   Social History   Socioeconomic History   Marital status: Divorced    Spouse name: Not on file   Number of children: 2   Years of education: Not on file   Highest education level: Not on file  Occupational History   Not on file  Tobacco Use   Smoking  status: Every Day    Packs/day: 1.50    Years: 40.00    Pack years: 60.00    Types: Cigarettes   Smokeless tobacco: Never  Substance and Sexual Activity   Alcohol use: Not Currently   Drug use: Never   Sexual activity: Not on file  Other Topics Concern   Not on file  Social History Narrative   Currently divorced.  No longer working.  Previously worked in [employer].   Lives near [place].   Still smokes about 2 packs a day.   Social Determinants of Health   Financial Resource Strain: Low Risk    Difficulty of Paying Living Expenses: Not very hard  Food Insecurity: No Food Insecurity   Worried About Charity fundraiser in the Last Year: Never true   Ran Out of Food in the Last Year: Never true  Transportation Needs: No Transportation Needs   Lack of Transportation (Medical): No   Lack of Transportation (Non-Medical): No  Physical Activity: Not on file  Stress: Not on file  Social Connections: Not on file     Family History: The patient's family history includes Cancer in her paternal aunt and paternal grandmother.  ROS:   Please see the history of present illness.  ++weakness All other systems reviewed and are negative.  Labs/Other Studies Reviewed:    The following studies were reviewed today:  Echo 08/23/21  Left Ventricle: Distal anterior wall hypokinesis septal and apical  akinesis. Left ventricular ejection fraction, by estimation, is 30 to 35%.  The left ventricle has moderately decreased function. The left ventricle  demonstrates regional wall motion abnormalities. The left ventricular internal cavity size was moderately dilated. There is no left ventricular hypertrophy. Left ventricular diastolic parameters are consistent with Grade I diastolic dysfunction (impaired relaxation).  Right Ventricle: The right ventricular size is normal. No increase in  right ventricular wall thickness. Right ventricular systolic function is  normal.  Left  Atrium: Left atrial size was normal in size.  Right Atrium: Right atrial size was normal in size.  Pericardium: A small pericardial effusion is present. The pericardial  effusion is anterior to the right ventricle.  Mitral Valve: The mitral valve is abnormal. There is mild thickening of  the mitral valve leaflet(s). There is mild calcification of the mitral  valve leaflet(s). Mild mitral valve regurgitation. No evidence of mitral  valve stenosis.  Tricuspid Valve: The tricuspid valve is normal in structure. Tricuspid  valve regurgitation is not demonstrated. No evidence of tricuspid  stenosis.  Aortic Valve: The aortic valve is tricuspid. Aortic valve regurgitation is  not visualized. No aortic stenosis is present.  Pulmonic Valve: The pulmonic valve was normal in structure. Pulmonic valve regurgitation is not visualized. No evidence of pulmonic stenosis.  Aorta: The aortic root is normal in size and structure.  Venous: The inferior vena cava is normal in size with greater than 50%  respiratory variability, suggesting right atrial pressure of 3 mmHg.  IAS/Shunts: No atrial level shunt detected by color flow Doppler.   Right heart cath 02/15/21 RA = 2 RV = 17/2 PA = 17/4 (8) PCW = 4 Fick cardiac output/index = 3.0/2.4 Thermo CO/CI = 2.8/2.2 PVR = 1.3 Ao sat = 98% PA sat = 64%, 65%   Assessment: 1. Low filling pressures with moderately reduced cardiac output   Echo 02/13/21  Left Ventricle: Left ventricular ejection fraction, by estimation, is 20  to 25%. The left ventricle has severely decreased function. The left  ventricle demonstrates regional wall motion abnormalities. The left  ventricular internal cavity size was normal in size. There is moderate left ventricular hypertrophy of the basal-septal segment.  Right Ventricle: The right ventricular size is normal.Right ventricular  systolic function is normal.  Left Atrium: Left atrial size was normal in size.  Right Atrium:  Right atrial size was normal in size.  Pericardium: A large pericardial effusion is present.  Mitral Valve: The mitral valve is normal in structure. No evidence of  mitral valve stenosis.  Tricuspid Valve: The tricuspid valve is normal in structure. Tricuspid  valve regurgitation is trivial. No evidence of tricuspid stenosis.  Aortic Valve: The aortic valve is tricuspid. Aortic valve regurgitation is  not visualized. No aortic stenosis is present.  Pulmonic Valve: The pulmonic valve was not well visualized. No evidence of pulmonic stenosis.  Aorta: The aortic root is normal in size and structure.  Venous: The inferior vena cava is normal in size with greater than 50%  respiratory variability, suggesting right atrial pressure of 3 mmHg.    Recent Labs: 02/01/2021: B Natriuretic Peptide 792.9 02/10/2021: TSH 1.140 02/12/2021: Magnesium 1.9 02/20/2021: Hemoglobin 11.8; Platelets 330 03/30/2021: ALT 8 04/08/2021: BUN 7; Creatinine, Ser 0.65; Potassium 3.5; Sodium 128  Recent Lipid Panel    Component Value Date/Time   CHOL 193 03/30/2021 1021   TRIG 123 03/30/2021 1021   HDL 65 03/30/2021 1021   CHOLHDL 3.0 03/30/2021 1021   CHOLHDL 3.5 01/31/2021 0632   VLDL 11 01/31/2021 0632   LDLCALC 106 (H) 03/30/2021 1021     Physical Exam:    VS:  BP 120/76 (BP Location: Left Arm, Patient Position: Sitting, Cuff Size: Normal)    Pulse 93    Ht 4\' 9"  (1.448 m)    Wt 108 lb (49 kg)    SpO2 98%    BMI 23.37 kg/m     Wt Readings from Last 3 Encounters:  09/21/21 108 lb (49 kg)  08/16/21 107 lb 9.6 oz (48.8 kg)  04/08/21 96 lb 6.4 oz (43.7 kg)     GEN: Frail, chronically ill appearing  HEENT: Normal NECK: No JVD; No carotid bruits LYMPHATICS: No lymphadenopathy CARDIAC: RRR, no murmurs, rubs, gallops RESPIRATORY:  Decreased breath sounds bilaterally upon auscultation without rales, wheezing or rhonchi  ABDOMEN: Soft, non-tender, non-distended MUSCULOSKELETAL:  No edema; No deformity  SKIN:  Warm and dry NEUROLOGIC:  Alert and oriented x 3 PSYCHIATRIC:  Normal affect   EKG:  EKG is not ordered today.  Diagnoses:    1. Chronic systolic heart failure ([place])   2. Adverse effect of digoxin, subsequent encounter   3. Coronary artery disease involving native coronary artery of native heart without angina pectoris   4. Ischemic cardiomyopathy   5. Tobacco abuse    Assessment and Plan:     Chronic HFrEF: Echo 08/23/21 reveals slightly improved LVEF 30-35% with G1DD, distal anterior wall and anteroseptal wall are akinetic. Dr. Allison Quarry report reveals that he is still concerned about potential for abnormal rhythms. She appears euvolemic today. Hypotension has limited optimal GDMT.  We will reduce midodrine guidelines to only give for SBP < 105 mmHg.  Would favor addition of low-dose spironolactone at next visit if SBP is > 110.  We will refer her to EP for consideration of ICD.  Discussed with patient and family that her physical state may limit her from undergoing surgery in the near future. Encouraged family involvement at the EP appointment.  Hypotension: Blood pressure in clinic today is 120/76.  She is not very active, but denies dizziness, lightheadedness, presyncope, syncope.  I do not have blood pressure readings from her facility and patient is not aware of recent measurements. I have documentation of midodrine being given twice a day every day in December. The guidelines state to hold midodrine 2.5 mg if systolic blood pressure is greater than 125 mmHg.  We will reduce guideline to only give midodrine 2.5 mg if systolic blood pressure is less than 105 mmHg in hopes of discontinuing the midodrine and holding blood pressure for addition of GDMT at next visit. Continue metoprolol 12.5 mg.   CAD without angina/Ischemic cardiomyopathy: She denies chest pain, shortness of breath, or other symptoms concerning for angina. As noted above, will refer her to EP for consideration of ICD.   Continue Lipitor, Brilinta, metoprolol.  Hyperlipidemia LDL goal < 70: LDL 106 03/30/21. She has been on Lipitor 80 mg since MI in May, 2022.  We will recheck lipid today.  Pericardial effusion: Relatively large pericardial effusion noted 2 weeks after her MI May 2022.  Dr. Ellyn Hack felt that this was probably related to a Dressler's type of inflammatory process.  Most recent echocardiogram 08/23/21 reveals that pericardial effusion is now small.  She denies symptoms of chest pain or pain or dyspnea when supine.  Digoxin therapy: Per patient's paperwork from [facility], patient receives digoxin daily.  Chart review reveals that this was previously stopped due to hyponatremia and hypokalemia.  We will check digoxin level today as well as basic metabolic panel.   Tobacco abuse: She continues to smoke approximately 6 cigarettes per day. She is not interested in quitting at this time. Complete cessation advised.      Disposition: Referral to EP for consideration of ICD, 4-5 week f/u with APP or Dr. Ellyn Hack  Medication Adjustments/Labs and Tests Ordered: Current medicines are reviewed at length with the patient today.  Concerns regarding medicines are outlined above.  Orders Placed This Encounter  Procedures   Digoxin level   Ambulatory referral to Cardiac Electrophysiology   Meds ordered this encounter  Medications   midodrine (PROAMATINE) 2.5 MG tablet    Sig: Only give if Systolic blood pressure is less than 105 mmhg    Dispense:  30 tablet    Refill:  2    Patient Instructions  Medication Instructions:  ONLY give Midodrine if Systolic (top number) blood pressure is less than 105 mmhg  *If you need a refill on your cardiac medications before  your next appointment, please call your pharmacy*  Lab Work: Previously ordered labs CMET If you have labs (blood work) drawn today and your tests are completely normal, you will receive your results only by: [place] (if you have  MyChart) OR A paper copy in the mail If you have any lab test that is abnormal or we need to change your treatment, we will call you to review the results.  Testing/Procedures: NONE ordered at this time of appointment   Follow-Up: At [facility], you and your health needs are our priority.  As part of our continuing mission to provide you with exceptional heart care, we have created designated Provider Care Teams.  These Care Teams include your primary Cardiologist (physician) and Advanced Practice Providers (APPs -  Physician Assistants and Nurse Practitioners) who all work together to provide you with the care you need, when you need it.  We recommend signing up for the patient portal called "MyChart".  Sign up information is provided on this After Visit Summary.  MyChart is used to connect with patients for Virtual Visits (Telemedicine).  Patients are able to view lab/test results, encounter notes, upcoming appointments, etc.  Non-urgent messages can be sent to your provider as well.   To learn more about what you can do with MyChart, go to [email or web address].    Your next appointment:   3 month(s)  The format for your next appointment:   In Person  Provider:   Glenetta Hew, MD  or APP   Other Instructions You have been referred to Cardiac Electrophysiology      Signed, Emmaline Life, NP  09/21/2021 4:57 PM    [place]

## 2021-09-21 ENCOUNTER — Other Ambulatory Visit: Payer: Self-pay

## 2021-09-21 ENCOUNTER — Encounter: Payer: Self-pay | Admitting: Nurse Practitioner

## 2021-09-21 ENCOUNTER — Ambulatory Visit (INDEPENDENT_AMBULATORY_CARE_PROVIDER_SITE_OTHER): Payer: Medicaid Other | Admitting: Nurse Practitioner

## 2021-09-21 VITALS — BP 120/76 | HR 93 | Ht <= 58 in | Wt 108.0 lb

## 2021-09-21 DIAGNOSIS — I2510 Atherosclerotic heart disease of native coronary artery without angina pectoris: Secondary | ICD-10-CM | POA: Diagnosis not present

## 2021-09-21 DIAGNOSIS — I255 Ischemic cardiomyopathy: Secondary | ICD-10-CM | POA: Diagnosis not present

## 2021-09-21 DIAGNOSIS — I5022 Chronic systolic (congestive) heart failure: Secondary | ICD-10-CM

## 2021-09-21 DIAGNOSIS — T460X5D Adverse effect of cardiac-stimulant glycosides and drugs of similar action, subsequent encounter: Secondary | ICD-10-CM | POA: Diagnosis not present

## 2021-09-21 DIAGNOSIS — Z720 Tobacco use: Secondary | ICD-10-CM

## 2021-09-21 MED ORDER — MIDODRINE HCL 2.5 MG PO TABS
ORAL_TABLET | ORAL | 2 refills | Status: DC
Start: 2021-09-21 — End: 2023-11-21

## 2021-09-21 NOTE — Patient Instructions (Signed)
Medication Instructions:  ONLY give Midodrine if Systolic (top number) blood pressure is less than 105 mmhg  *If you need a refill on your cardiac medications before your next appointment, please call your pharmacy*  Lab Work: Previously ordered labs CMET If you have labs (blood work) drawn today and your tests are completely normal, you will receive your results only by: [place] (if you have MyChart) OR A paper copy in the mail If you have any lab test that is abnormal or we need to change your treatment, we will call you to review the results.  Testing/Procedures: NONE ordered at this time of appointment   Follow-Up: At [facility], you and your health needs are our priority.  As part of our continuing mission to provide you with exceptional heart care, we have created designated Provider Care Teams.  These Care Teams include your primary Cardiologist (physician) and Advanced Practice Providers (APPs -  Physician Assistants and Nurse Practitioners) who all work together to provide you with the care you need, when you need it.  We recommend signing up for the patient portal called "MyChart".  Sign up information is provided on this After Visit Summary.  MyChart is used to connect with patients for Virtual Visits (Telemedicine).  Patients are able to view lab/test results, encounter notes, upcoming appointments, etc.  Non-urgent messages can be sent to your provider as well.   To learn more about what you can do with MyChart, go to [email or web address].    Your next appointment:   3 month(s)  The format for your next appointment:   In Person  Provider:   Glenetta Hew, MD  or APP   Other Instructions You have been referred to Cardiac Electrophysiology

## 2021-09-22 LAB — COMPREHENSIVE METABOLIC PANEL
ALT: 18 IU/L (ref 0–32)
AST: 22 IU/L (ref 0–40)
Albumin/Globulin Ratio: 2 (ref 1.2–2.2)
Albumin: 4.3 g/dL (ref 3.8–4.8)
Alkaline Phosphatase: 96 IU/L (ref 44–121)
BUN/Creatinine Ratio: 7 — ABNORMAL LOW (ref 12–28)
BUN: 5 mg/dL — ABNORMAL LOW (ref 8–27)
Bilirubin Total: <0.2 mg/dL (ref 0.0–1.2)
CO2: 25 mmol/L (ref 20–29)
Calcium: 9.4 mg/dL (ref 8.7–10.3)
Chloride: 100 mmol/L (ref 96–106)
Creatinine, Ser: 0.76 mg/dL (ref 0.57–1.00)
Globulin, Total: 2.2 g/dL (ref 1.5–4.5)
Glucose: 97 mg/dL (ref 70–99)
Potassium: 4.1 mmol/L (ref 3.5–5.2)
Sodium: 139 mmol/L (ref 134–144)
Total Protein: 6.5 g/dL (ref 6.0–8.5)
eGFR: 88 mL/min/{1.73_m2} (ref >59)

## 2021-09-22 LAB — LIPID PANEL
Chol/HDL Ratio: 2.7 ratio (ref 0.0–4.4)
Cholesterol, Total: 135 mg/dL (ref 100–199)
HDL: 50 mg/dL (ref >39)
LDL Chol Calc (NIH): 66 mg/dL (ref 0–99)
Triglycerides: 106 mg/dL (ref 0–149)
VLDL Cholesterol Cal: 19 mg/dL (ref 5–40)

## 2021-09-22 LAB — DIGOXIN LEVEL: Digoxin, Serum: 0.6 ng/mL (ref 0.5–0.9)

## 2021-10-10 IMAGING — CT CT CERVICAL SPINE W/O CM
3 series · 14 of 33 positions shown, 17 images · non-contrast
Comparison: None.

CLINICAL DATA: Fall.  Head and trauma.  Initial encounter.

EXAM:
CT CERVICAL SPINE WITHOUT CONTRAST
TECHNIQUE: Multidetector CT imaging of the cervical spine was performed without
intravenous contrast. Multiplanar CT image reconstructions were also
generated.

[Series 5: c_spine 2.0 st · axial · 0.30mm/px · z∈[-312,-188]mm · 6 of 82 slices shown, 8 images]
[im 13/82  soft-tissue]
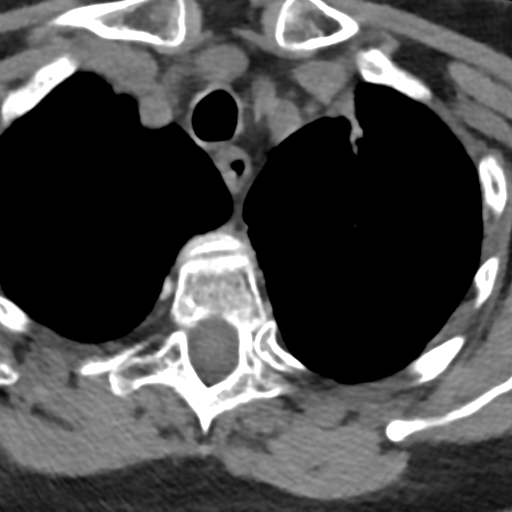
[im 13/82  bone]
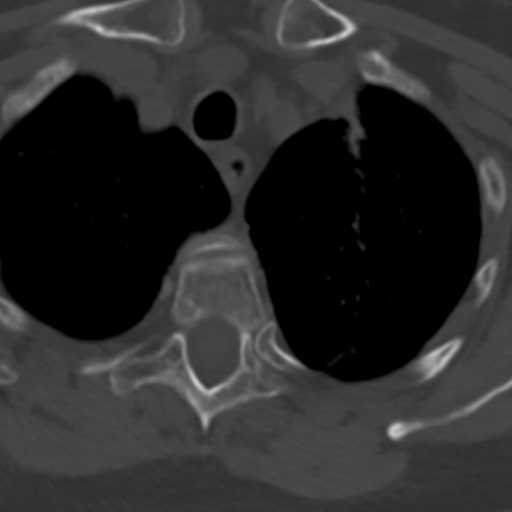
[im 25/82  bone]
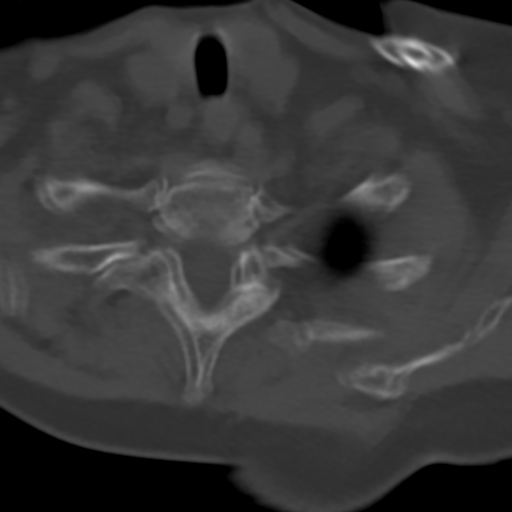
[im 38/82  bone]
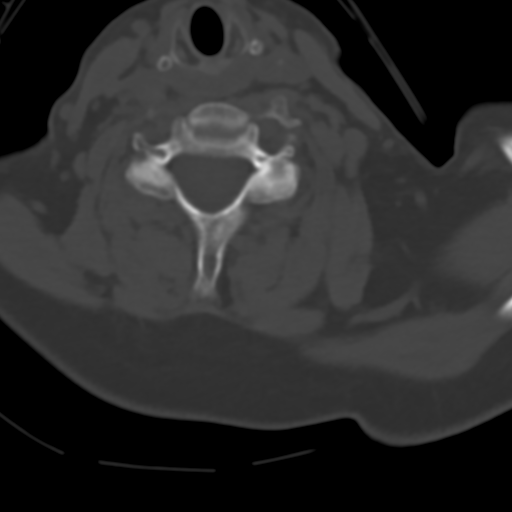
[im 50/82  bone]
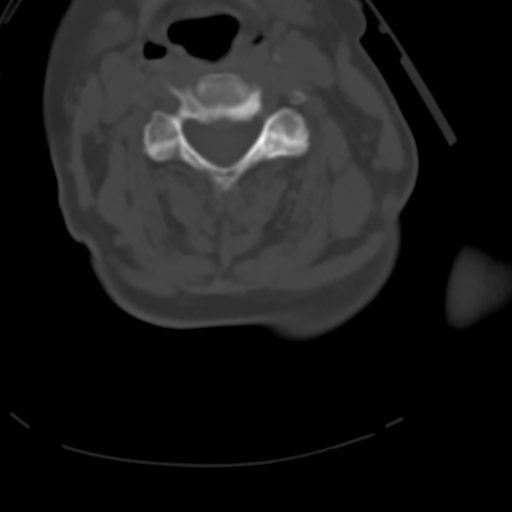
[im 63/82  soft-tissue]
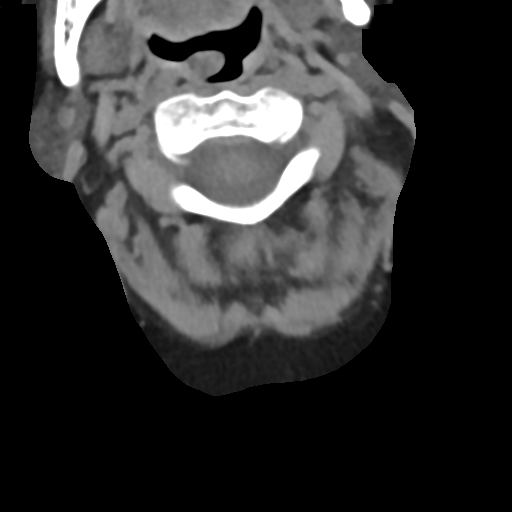
[im 63/82  bone]
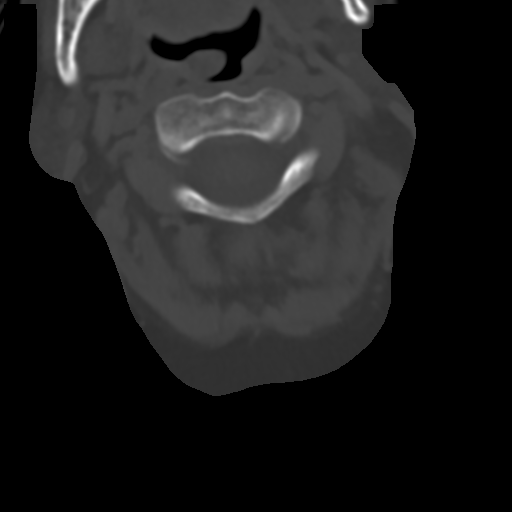
[im 75/82  bone]
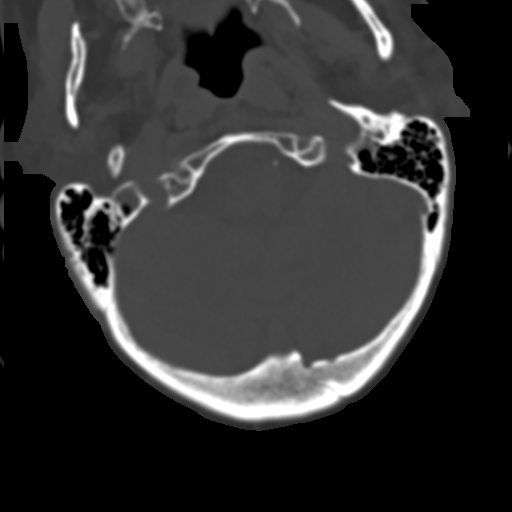

[Series 6: coronal bone · coronal · 0.26mm/px · 3 of 61 slices shown]
[im 13/61  bone]
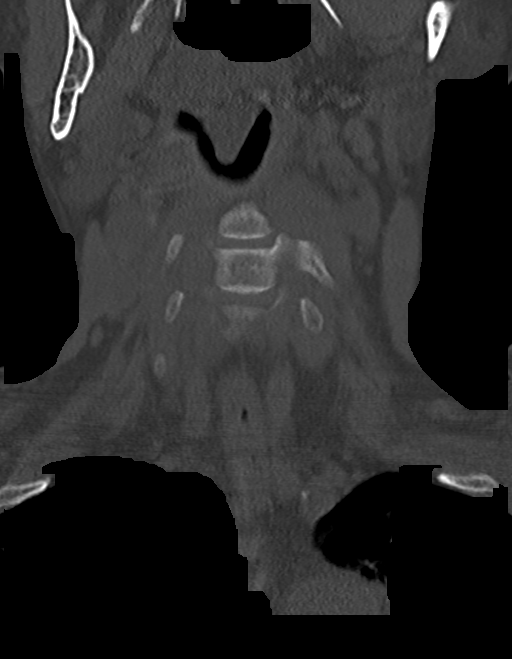
[im 25/61  bone]
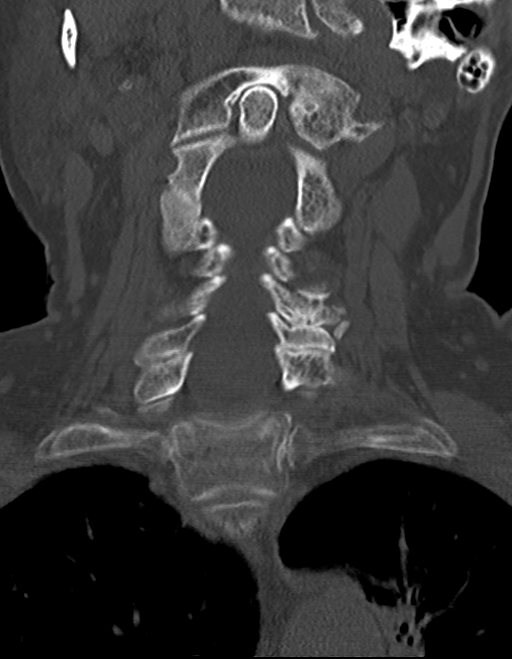
[im 37/61  bone]
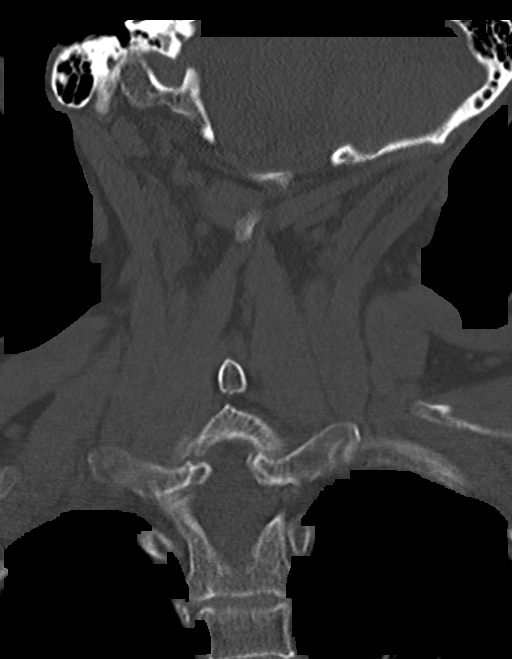

[Series 7: sagittal bone · sagittal · 0.26mm/px · 5 of 58 slices shown, 6 images]
[im 20/58  bone]
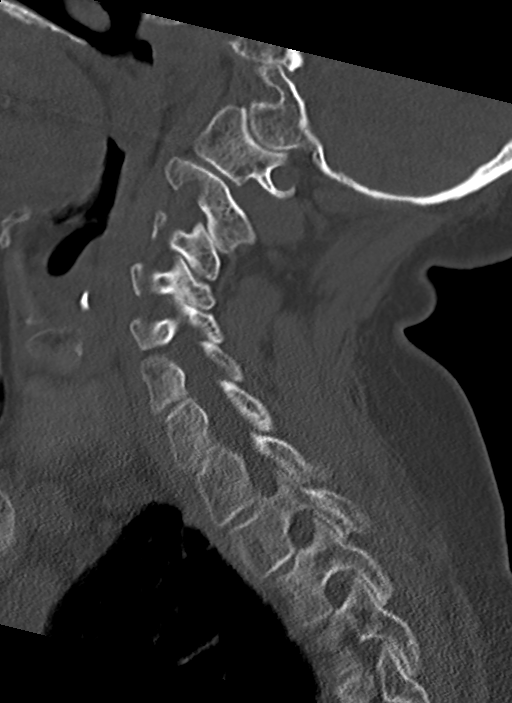
[im 24/58  bone]
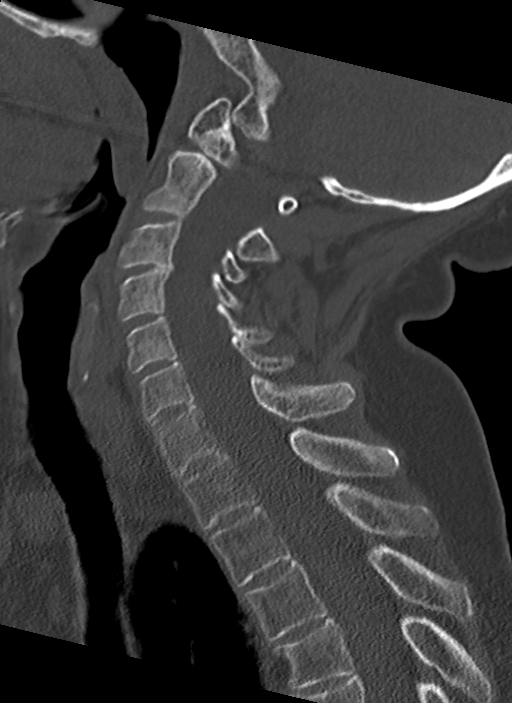
[im 29/58  soft-tissue]
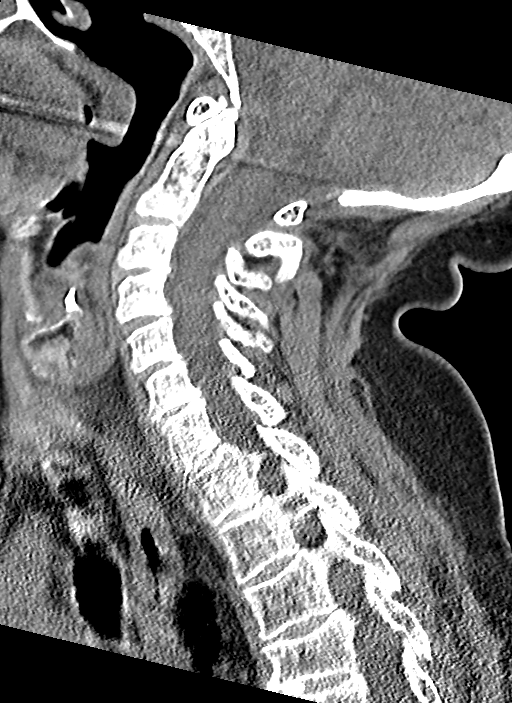
[im 29/58  bone]
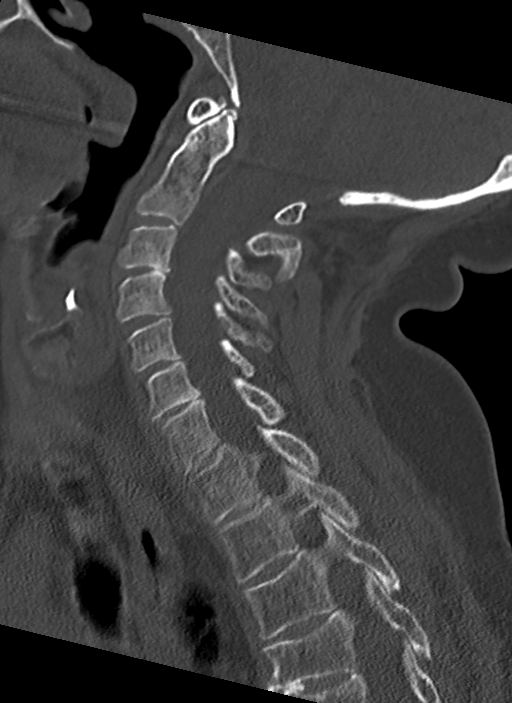
[im 34/58  bone]
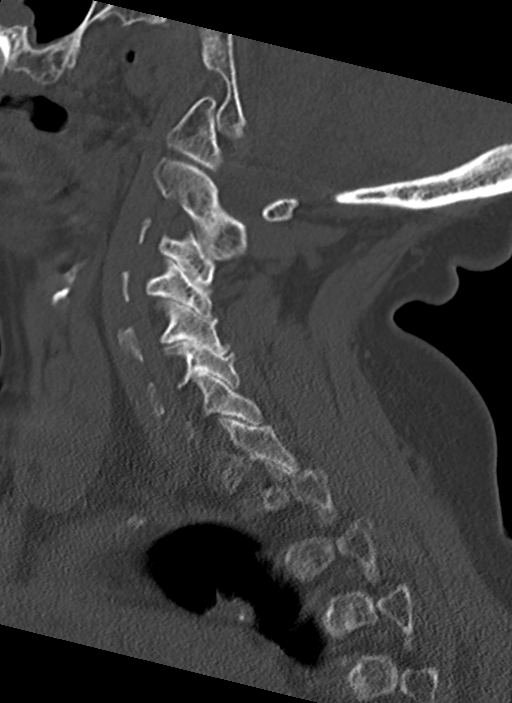
[im 39/58  bone]
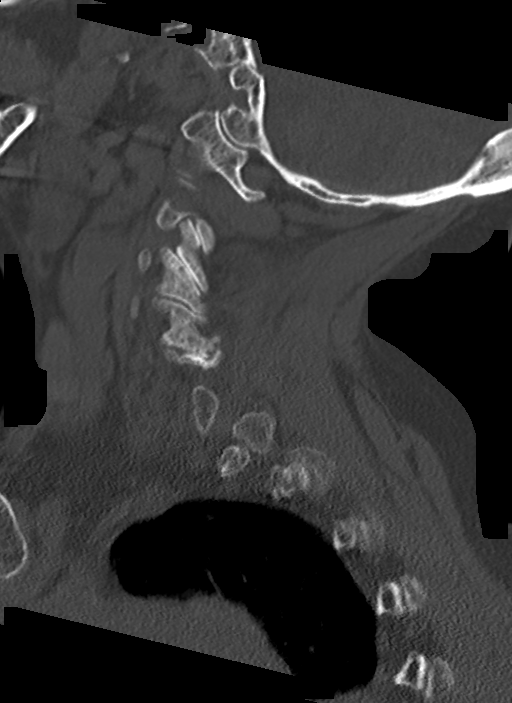

[14 of 33 positions shown; findings below may reference images not displayed]

FINDINGS: Alignment: Normal.

Skull base and vertebrae: No acute fracture. No primary bone lesion
or focal pathologic process.

Soft tissues and spinal canal: No prevertebral fluid or swelling. No
visible canal hematoma.

Disc levels: No disc space narrowing. Mild-to-moderate facet DJD is
seen on the left from levels of C3-C6.

Upper chest: No acute findings.  Scarring noted in left lung apex.

Other: None.
IMPRESSION: No evidence of cervical spine fracture or subluxation.

Mild-to-moderate left facet DJD.

## 2021-11-01 ENCOUNTER — Other Ambulatory Visit: Payer: Self-pay

## 2021-11-01 ENCOUNTER — Encounter: Payer: Self-pay | Admitting: Internal Medicine

## 2021-11-01 ENCOUNTER — Ambulatory Visit (INDEPENDENT_AMBULATORY_CARE_PROVIDER_SITE_OTHER): Payer: Medicaid Other | Admitting: Internal Medicine

## 2021-11-01 ENCOUNTER — Encounter: Payer: Self-pay | Admitting: *Deleted

## 2021-11-01 VITALS — BP 108/64 | HR 85 | Ht <= 58 in | Wt 109.0 lb

## 2021-11-01 DIAGNOSIS — Z01818 Encounter for other preprocedural examination: Secondary | ICD-10-CM

## 2021-11-01 DIAGNOSIS — I5022 Chronic systolic (congestive) heart failure: Secondary | ICD-10-CM

## 2021-11-01 LAB — CBC WITH DIFFERENTIAL/PLATELET
Basophils Absolute: 0.1 10*3/uL (ref 0.0–0.2)
Basos: 1 %
EOS (ABSOLUTE): 0.1 10*3/uL (ref 0.0–0.4)
Eos: 1 %
Hematocrit: 35.2 % (ref 34.0–46.6)
Hemoglobin: 11.9 g/dL (ref 11.1–15.9)
Immature Grans (Abs): 0 10*3/uL (ref 0.0–0.1)
Immature Granulocytes: 0 %
Lymphocytes Absolute: 1.2 10*3/uL (ref 0.7–3.1)
Lymphs: 14 %
MCH: 32.6 pg (ref 26.6–33.0)
MCHC: 33.8 g/dL (ref 31.5–35.7)
MCV: 96 fL (ref 79–97)
Monocytes Absolute: 0.9 10*3/uL (ref 0.1–0.9)
Monocytes: 10 %
Neutrophils Absolute: 6.2 10*3/uL (ref 1.4–7.0)
Neutrophils: 74 %
Platelets: 284 10*3/uL (ref 150–450)
RBC: 3.65 x10E6/uL — ABNORMAL LOW (ref 3.77–5.28)
RDW: 12.4 % (ref 11.7–15.4)
WBC: 8.4 10*3/uL (ref 3.4–10.8)

## 2021-11-01 LAB — BASIC METABOLIC PANEL
BUN/Creatinine Ratio: 6 — ABNORMAL LOW (ref 12–28)
BUN: 4 mg/dL — ABNORMAL LOW (ref 8–27)
CO2: 25 mmol/L (ref 20–29)
Calcium: 9.5 mg/dL (ref 8.7–10.3)
Chloride: 99 mmol/L (ref 96–106)
Creatinine, Ser: 0.66 mg/dL (ref 0.57–1.00)
Glucose: 88 mg/dL (ref 70–99)
Potassium: 3.5 mmol/L (ref 3.5–5.2)
Sodium: 137 mmol/L (ref 134–144)
eGFR: 99 mL/min/{1.73_m2} (ref >59)

## 2021-11-01 NOTE — H&P (View-Only) (Signed)
HPI [name] is referred today by Colleen Fleming for evaluation of and consideration for ICD insertion. She is a pleasant 66 yo woman with a h/o CAD s/p MI with LAD occlusion. She has severely decreased LV systolic function with her MI complicated by a pericardial effusion. She has not had syncope. 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 She underwent chemo and is thought to be cured. She continues to smoke. She underwent primary PCI about 10 months ago. She has had some improvement in her EF to 30-35%. She has class 2 CHF symptoms.  Allergies  Allergen Reactions   Penicillins Hives     Current Outpatient Medications  Medication Sig Dispense Refill   atorvastatin (LIPITOR) 80 MG tablet Take 1 tablet (80 mg total) by mouth daily. 30 tablet 5   Digoxin 62.5 MCG TABS Take 62.5 mg by mouth daily.     furosemide (LASIX) 40 MG tablet Take 1 tablet (40 mg total) by mouth daily as needed for fluid or edema (for weight gain more than 3 lbs in a day , leg edema). 30 tablet 11   metoprolol succinate (TOPROL XL) 25 MG 24 hr tablet Take 0.5 tablets (12.5 mg total) by mouth daily. 45 tablet 3   midodrine (PROAMATINE) 2.5 MG tablet Only give if Systolic blood pressure is less than 105 mmhg 30 tablet 2   nitroGLYCERIN (NITROSTAT) 0.4 MG SL tablet Place 1 tablet (0.4 mg total) under the tongue every 5 (five) minutes x 3 doses as needed for chest pain. 25 tablet 2   potassium chloride SA (KLOR-CON) 20 MEQ tablet Take 1 tablet (20 mEq total) by mouth daily as needed (when given lasix).     ticagrelor (BRILINTA) 90 MG TABS tablet Take 1 tablet (90 mg total) by mouth 2 (two) times daily. 60 tablet 5   No current facility-administered medications for this visit.     Past Medical History:  Diagnosis Date   Acute ST elevation myocardial infarction (STEMI) involving left anterior descending (LAD) coronary artery ([place]) 01/30/2021   99% thrombotic subtotal occlusion of Prox LAD =>  (DES PCI LAD).  apical LAD occlusion with distal embolization.  EF estimated 40-45% w/ mid-apical anterior HK; ~ normal LVEDP with SBP 98 mmHg. => Echo EF 20 to 25% with entire anterior wall hypokinesis and apical akinesis.   Anxiety and depression    Chronic combined systolic and diastolic CHF, NYHA class 2 ([place]) 02/13/2021   EF 25%.  GR 2 DD.   COPD (chronic obstructive pulmonary disease) (HCC)    Long-term smoker greater than 35 years 1 to 2 pack a day.   Coronary artery disease involving native coronary artery of native heart with unstable angina pectoris ([place])    Presented with anterior STEMI-99% subtotal occluded proximal LAD-DES PCI (Synergy DES 2.5 mm 60 mm - 2.8 mm) distally apical thromboembolism.  Reduced EF with anterior and apical hypokinesis.   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  On midodrine for blood pressure support.  Previously not able to tolerate beta-blocker or other CHF medications.   Ischemic cardiomyopathy 08/16/2021   EF 20 to 25% following anterior STEMI, despite LAD PCI.Marland Kitchen  Unable to titrate medications due to hypotension.   Obesity    Panlobular emphysema ([place])    Small cell lung cancer, left upper lobe ([place]) 2014   Treated with radiation and chemotherapy [facility])    ROS:   All systems reviewed and negative except as noted in the HPI.   Past Surgical History:  Procedure Laterality Date   CORONARY/GRAFT ACUTE MI REVASCULARIZATION N/A 01/30/2021   Procedure: Coronary/Graft Acute MI Revascularization;  Surgeon: Leonie Man, MD;  Location: [facility];  Service: Cardiovascular; Prox LAD 99% (-> 0%) DES PCI (Synergy DES 2.5 mm x 16 mm - 2.50mm) -> distal  embolization with apical 80% occlusion   LEFT HEART CATH AND CORONARY ANGIOGRAPHY N/A 01/30/2021   Procedure: LEFT HEART CATH AND CORONARY ANGIOGRAPHY;  Surgeon: Leonie Man, MD;  Location: [facility];  Service: Cardiovascular; ANT STEMI: 99% thrombotic subtotal occlusion of Prox LAD => (DES PCI LAD). Otherwise angiographically normal coronaries with exception of apical LAD occlusion with distal embolization.  EF estimated 45% w/ mid-apical anterior HK; ~ nl LVEDP w/  SBP 98 mmHg.   RIGHT HEART CATH N/A 02/15/2021   Procedure: RIGHT HEART CATH;  Surgeon: Jolaine Artist, MD;  Location: [place];  Service: CardiovRA = 2 mmHg; RV = 17/2 mmHg, PA = 17/4 (8) mmHg; PCW = 4 mmHg;   Ao sat = 98%, PA sat = 64%, 65%Fick Cardiac Output/Index = 3.0/2.4; Thermo CO/CI = 2.8/2.2; PVR = 1.3 [employer];   TRANSTHORACIC ECHOCARDIOGRAM  01/31/2021   (post Ant STEMI -PCI): EF 25-30%. Severe HK of entire Anterior/Anteroseptal wall & mild dyskinesis of anteroapical/apical segment. Gr 2 DD.  Normal RV size and function with mildly elevated.  RAP estimated 8 mm.  Small circumferential pericardial effusion.  Normal aortic and mitral valves.   TRANSTHORACIC ECHOCARDIOGRAM  02/01/2021   a) Limited: small-mod pericardial effusion primarily anterior to RV. IVC collapses ~50%. Nl RV & RAP. NO OVERT TAMPONADE; b) 5/14: Large, predominantly anterior pericardial effusion -> findings suggestive of early tamponade physiology, but no RV diastolic collapse and IVC not dilated.  RAP 3 mmHg. = EFFUSION LARGER, no TAMPONADE     Family History  Problem Relation Age of Onset   Cancer Paternal Grandmother    Cancer Paternal Aunt      Social History   Socioeconomic History   Marital status: Divorced    Spouse name: Not on file   Number of children: 2   Years of education: Not on file   Highest education level: Not on file  Occupational History   Not on file  Tobacco Use   Smoking status: Every Day     Packs/day: 1.50    Years: 40.00    Pack years: 60.00    Types: Cigarettes   Smokeless tobacco: Never  Substance and Sexual Activity   Alcohol use: Not Currently   Drug use: Never   Sexual activity: Not on file  Other Topics Concern   Not on file  Social History Narrative   Currently divorced.  No longer working.  Previously worked in [employer].   Lives near [place].   Still smokes about 2 packs a day.   Social Determinants of Health   Financial Resource Strain: Low Risk    Difficulty of Paying Living Expenses: Not very hard  Food Insecurity: No Food Insecurity   Worried About Running Out of Food in the Last Year: Never true   Ran Out  of Food in the Last Year: Never true  Transportation Needs: No Transportation Needs   Lack of Transportation (Medical): No   Lack of Transportation (Non-Medical): No  Physical Activity: Not on file  Stress: Not on file  Social Connections: Not on file  Intimate Partner Violence: Not on file     BP 108/64    Pulse 85    Ht 4\' 9"  (1.448 m)    Wt 109 lb (49.4 kg)    SpO2 96%    BMI 23.59 kg/m   Physical Exam:  Well appearing 66 yo woman, NAD HEENT: Unremarkable Neck:  No JVD, no thyromegally Lymphatics:  No adenopathy Back:  No CVA tenderness Lungs:  Clear HEART:  Regular rate rhythm, no murmurs, no rubs, no clicks Abd:  soft, positive bowel sounds, no organomegally, no rebound, no guarding Ext:  2 plus pulses, no edema, no cyanosis, no clubbing Skin:  No rashes no nodules Neuro:  CN II through XII intact, motor grossly intact  EKG - NSR with AS MI  Assess/Plan:  ICM - I have discussed the treatment options with the patient and recommended ICD insertion for primary prevention of malignant arrhythmias. She will continue medical therapy as well. Tobacco abuse - I have strongly encouraged the patient to stop smoking. Despite her near fatal MI and remote lung CA, she continues to smoke. Chronic systolic heart failure - her  symptoms are class 2. She is on GDMT. She is not on an ACE inhibitor due to hypotension. Hopefully with time this can be started.  Colleen Overlie Makinzy Cleere,MD

## 2021-11-01 NOTE — Patient Instructions (Addendum)
Medication Instructions:  Your physician recommends that you continue on your current medications as directed. Please refer to the Current Medication list given to you today. *If you need a refill on your cardiac medications before your next appointment, please call your pharmacy*  Lab Work: CBC, BMP  If you have labs (blood work) drawn today and your tests are completely normal, you will receive your results only by: [place] (if you have MyChart) OR A paper copy in the mail If you have any lab test that is abnormal or we need to change your treatment, we will call you to review the results.  Testing/Procedures: Your physician has recommended that you have a defibrillator inserted. An implantable cardioverter defibrillator (ICD) is a small device that is placed in your chest or, in rare cases, your abdomen. This device uses electrical pulses or shocks to help control life-threatening, irregular heartbeats that could lead the heart to suddenly stop beating (sudden cardiac arrest). Leads are attached to the ICD that goes into your heart. This is done in the hospital and usually requires an overnight stay. Please see the instruction sheet given to you today for more information.  Follow-Up: At [facility], you and your health needs are our priority.  As part of our continuing mission to provide you with exceptional heart care, we have created designated Provider Care Teams.  These Care Teams include your primary Cardiologist (physician) and Advanced Practice Providers (APPs -  Physician Assistants and Nurse Practitioners) who all work together to provide you with the care you need, when you need it.  Your physician wants you to follow-up in: see instruction letter.    We recommend signing up for the patient portal called "MyChart".  Sign up information is provided on this After Visit Summary.  MyChart is used to connect with patients for Virtual Visits (Telemedicine).  Patients are able  to view lab/test results, encounter notes, upcoming appointments, etc.  Non-urgent messages can be sent to your provider as well.   To learn more about what you can do with MyChart, go to [email or web address].    Any Other Special Instructions Will Be Listed Below (If Applicable).  Cardioverter Defibrillator Implantation An implantable cardioverter defibrillator (ICD) is a device that identifies and corrects abnormal heart rhythms. Cardioverter defibrillator implantation is a surgery to place an ICD under the skin in the chest or abdomen. An ICD has a battery, a small computer (pulse generator), and wires (leads) that go into the heart. The ICD detects and corrects two types of dangerous irregular heart rhythms (arrhythmias): A rapid heart rhythm in the lower chambers of the heart (ventricles). This is called ventricular tachycardia. The ventricles contracting in an uncoordinated way. This is called ventricular fibrillation. There are different types of ICDs, and the electrical signals from the ICD can be programmed differently based on the condition being treated. The electrical signals from the ICD can be low-energy pulses, high-energy shocks, or a combination of the two. The low-energy pulses are generally used to restore the heartbeat to normal when it is either too slow (bradycardia) or too fast. These pulses are painless. The high-energy shocks are used to treat abnormal rhythms such as ventricular tachycardia or ventricular fibrillation. This shock may feel like a strong jolt in the chest. Your health care provider may recommend an ICD if you have: Had a ventricular arrhythmia in the past. A damaged heart because of a disease or heart condition. A weakened heart muscle from a heart attack or cardiac  arrest. A congenital heart defect. Long QT syndrome, which is a disorder of the heart's electrical system. Brugada syndrome, which is a condition that causes a disruption of the heart's normal  rhythm. Tell a health care provider about: Any allergies you have. All medicines you are taking, including vitamins, herbs, eye drops, creams, and over-the-counter medicines. Any problems you or family members have had with anesthetic medicines. Any blood disorders you have. Any surgeries you have had. Any medical conditions you have. Whether you are pregnant or may be pregnant. What are the risks? Generally, this is a safe procedure. However, problems may occur, including: Infection. Bleeding. Allergic reactions to medicines used during the procedure. Blood clots. Swelling or bruising. Damage to nearby structures or organs, such as nerves, lungs, blood vessels, or the heart where the ICD leads or pulse generator is implanted. What happens before the procedure? Staying hydrated Follow instructions from your health care provider about hydration, which may include: Up to 2 hours before the procedure - you may continue to drink clear liquids, such as water, clear fruit juice, black coffee, and plain tea.  Eating and drinking restrictions Follow instructions from your health care provider about eating and drinking, which may include: 8 hours before the procedure - stop eating heavy meals or foods, such as meat, fried foods, or fatty foods. 6 hours before the procedure - stop eating light meals or foods, such as toast or cereal. 6 hours before the procedure - stop drinking milk or drinks that contain milk. 2 hours before the procedure - stop drinking clear liquids. Medicines Ask your health care provider about: Changing or stopping your regular medicines. This is especially important if you are taking diabetes medicines or blood thinners. Taking medicines such as aspirin and ibuprofen. These medicines can thin your blood. Do not take these medicines unless your health care provider tells you to take them. Taking over-the-counter medicines, vitamins, herbs, and supplements. Tests You may  have an exam or testing. These may include: Blood tests. A test to check the electrical signals in your heart (electrocardiogram, ECG). Imaging tests, such as a chest X-ray. Echocardiogram. This is an ultrasound of your heart to evaluate your heart structures and function. An event monitor or Holter monitor to wear at home. General instructions Do not use any products that contain nicotine or tobacco for at least 4 weeks before the procedure. These products include cigarettes, chewing tobacco, and vaping devices, such as e-cigarettes. If you need help quitting, ask your health care provider. Ask your health care provider: How your procedure site will be marked. What steps will be taken to help prevent infection. These may include: Removing hair at the surgery site. Washing skin with a germ-killing soap. Taking antibiotic medicine. You may be asked to shower with a germ-killing soap. Plan to have a responsible adult take you home from the hospital or clinic. What happens during the procedure?  Small monitors will be put on your body. They will be used to check your heart rate, blood pressure, and oxygen level. A pair of sticky pads (defibrillator pads) may be placed on your back and chest. These pads are able to pace your heart as needed during the procedure. An IV will be inserted into one of your veins. You will be given one or more of the following: A medicine to help you relax (sedative). A medicine to numb the area (local anesthetic). A medicine to make you fall asleep(general anesthetic). A small incision will be made to  create a deep pocket under the skin of your chest or abdomen. Leads will be guided through a blood vessel into your heart and attached to your heart muscles. Depending on the ICD, the leads may go into one ventricle, or they may go into both ventricles and into an upper chamber of the heart. An X-ray machine (fluoroscope) will be used to help guide the leads. The other  end of the leads will be attached to the pulse generator. The pulse generator will be placed into the pocket under the skin. The ICD will be tested, and your health care provider will program the ICD for the condition being treated. The incision will be closed with stitches (sutures), skin glue, adhesive strips, or staples. A bandage (dressing) will be placed over the incision. The procedure may vary among health care providers and hospitals. What happens after the procedure? Your blood pressure, heart rate, breathing rate, and blood oxygen level will be monitored until you leave the hospital or clinic. Your health care provider will also monitor your ICD to make sure it is working properly. A chest X-ray will be taken to check that the ICD is in the right place. Do not raise the arm on the side of your procedure higher than your shoulder for as long as told by your health care provider. This is usually at least 6 weeks. You may be given an identification card explaining that you have an ICD. You will be given a remote home monitoring device to use with your ICD to allow your device to communicate with your clinic. Summary An implantable cardioverter defibrillator (ICD) is a device that identifies and corrects abnormal heart rhythms. Cardioverter defibrillator implantation is a surgery to place an ICD under the skin in the chest or abdomen. An ICD consists of a battery, a small computer (pulse generator), and wires (leads) that go into the heart. During the procedure, the ICD will be tested, and your health care provider will program the ICD for the condition being treated. After the procedure, a chest X-ray will be taken to check that the ICD is in the right place. This information is not intended to replace advice given to you by your health care provider. Make sure you discuss any questions you have with your health care provider. Document Revised: 03/18/2020 Document Reviewed:  03/18/2020 Elsevier Patient Education  [place].

## 2021-11-01 NOTE — Progress Notes (Signed)
HPI [name] is referred today by Christen Bame for evaluation of and consideration for ICD insertion. She is a pleasant 66 yo woman with a h/o CAD s/p MI with LAD occlusion. She has severely decreased LV systolic function with her MI complicated by a pericardial effusion. She has not had syncope. 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 She underwent chemo and is thought to be cured. She continues to smoke. She underwent primary PCI about 10 months ago. She has had some improvement in her EF to 30-35%. She has class 2 CHF symptoms.  Allergies  Allergen Reactions   Penicillins Hives     Current Outpatient Medications  Medication Sig Dispense Refill   atorvastatin (LIPITOR) 80 MG tablet Take 1 tablet (80 mg total) by mouth daily. 30 tablet 5   Digoxin 62.5 MCG TABS Take 62.5 mg by mouth daily.     furosemide (LASIX) 40 MG tablet Take 1 tablet (40 mg total) by mouth daily as needed for fluid or edema (for weight gain more than 3 lbs in a day , leg edema). 30 tablet 11   metoprolol succinate (TOPROL XL) 25 MG 24 hr tablet Take 0.5 tablets (12.5 mg total) by mouth daily. 45 tablet 3   midodrine (PROAMATINE) 2.5 MG tablet Only give if Systolic blood pressure is less than 105 mmhg 30 tablet 2   nitroGLYCERIN (NITROSTAT) 0.4 MG SL tablet Place 1 tablet (0.4 mg total) under the tongue every 5 (five) minutes x 3 doses as needed for chest pain. 25 tablet 2   potassium chloride SA (KLOR-CON) 20 MEQ tablet Take 1 tablet (20 mEq total) by mouth daily as needed (when given lasix).     ticagrelor (BRILINTA) 90 MG TABS tablet Take 1 tablet (90 mg total) by mouth 2 (two) times daily. 60 tablet 5   No current facility-administered medications for this visit.     Past Medical History:  Diagnosis Date   Acute ST elevation myocardial infarction (STEMI) involving left anterior descending (LAD) coronary artery ([place]) 01/30/2021   99% thrombotic subtotal occlusion of Prox LAD =>  (DES PCI LAD).  apical LAD occlusion with distal embolization.  EF estimated 40-45% w/ mid-apical anterior HK; ~ normal LVEDP with SBP 98 mmHg. => Echo EF 20 to 25% with entire anterior wall hypokinesis and apical akinesis.   Anxiety and depression    Chronic combined systolic and diastolic CHF, NYHA class 2 ([place]) 02/13/2021   EF 25%.  GR 2 DD.   COPD (chronic obstructive pulmonary disease) (H[facility])    Long-term smoker greater than 35 years 1 to 2 pack a day.   Coronary artery disease involving native coronary artery of native heart with unstable angina pectoris ([place])    Presented with anterior STEMI-99% subtotal occluded proximal LAD-DES PCI (Synergy DES 2.5 mm 60 mm - 2.8 mm) distally apical thromboembolism.  Reduced EF with anterior and apical hypokinesis.   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  On midodrine for blood pressure support.  Previously not able to tolerate beta-blocker or other CHF medications.   Ischemic cardiomyopathy 08/16/2021   EF 20 to 25% following anterior STEMI, despite LAD PCI.Marland Kitchen  Unable to titrate medications due to hypotension.   Obesity    Panlobular emphysema ([place])    Small cell lung cancer, left upper lobe ([place]) 2014   Treated with radiation and chemotherapy [facility])    ROS:   All systems reviewed and negative except as noted in the HPI.   Past Surgical History:  Procedure Laterality Date   CORONARY/GRAFT ACUTE MI REVASCULARIZATION N/A 01/30/2021   Procedure: Coronary/Graft Acute MI Revascularization;  Surgeon: Leonie Man, MD;  Location: [place] [facility];  Service: Cardiovascular; Prox LAD 99% (-> 0%) DES PCI (Synergy DES 2.5 mm x 16 mm - 2.13mm) -> distal  embolization with apical 80% occlusion   LEFT HEART CATH AND CORONARY ANGIOGRAPHY N/A 01/30/2021   Procedure: LEFT HEART CATH AND CORONARY ANGIOGRAPHY;  Surgeon: Leonie Man, MD;  Location: [place] [facility];  Service: Cardiovascular; ANT STEMI: 99% thrombotic subtotal occlusion of Prox LAD => (DES PCI LAD). Otherwise angiographically normal coronaries with exception of apical LAD occlusion with distal embolization.  EF estimated 45% w/ mid-apical anterior HK; ~ nl LVEDP w/  SBP 98 mmHg.   RIGHT HEART CATH N/A 02/15/2021   Procedure: RIGHT HEART CATH;  Surgeon: Jolaine Artist, MD;  Location: [place] [facility];  Service: CardiovRA = 2 mmHg; RV = 17/2 mmHg, PA = 17/4 (8) mmHg; PCW = 4 mmHg;   Ao sat = 98%, PA sat = 64%, 65%Fick Cardiac Output/Index = 3.0/2.4; Thermo CO/CI = 2.8/2.2; PVR = 1.3 [employer];   TRANSTHORACIC ECHOCARDIOGRAM  01/31/2021   (post Ant STEMI -PCI): EF 25-30%. Severe HK of entire Anterior/Anteroseptal wall & mild dyskinesis of anteroapical/apical segment. Gr 2 DD.  Normal RV size and function with mildly elevated.  RAP estimated 8 mm.  Small circumferential pericardial effusion.  Normal aortic and mitral valves.   TRANSTHORACIC ECHOCARDIOGRAM  02/01/2021   a) Limited: small-mod pericardial effusion primarily anterior to RV. IVC collapses ~50%. Nl RV & RAP. NO OVERT TAMPONADE; b) 5/14: Large, predominantly anterior pericardial effusion -> findings suggestive of early tamponade physiology, but no RV diastolic collapse and IVC not dilated.  RAP 3 mmHg. = EFFUSION LARGER, no TAMPONADE     Family History  Problem Relation Age of Onset   Cancer Paternal Grandmother    Cancer Paternal Aunt      Social History   Socioeconomic History   Marital status: Divorced    Spouse name: Not on file   Number of children: 2   Years of education: Not on file   Highest education level: Not on file  Occupational History   Not on file  Tobacco Use   Smoking status: Every Day     Packs/day: 1.50    Years: 40.00    Pack years: 60.00    Types: Cigarettes   Smokeless tobacco: Never  Substance and Sexual Activity   Alcohol use: Not Currently   Drug use: Never   Sexual activity: Not on file  Other Topics Concern   Not on file  Social History Narrative   Currently divorced.  No longer working.  Previously worked in [employer].   Lives near [place].   Still smokes about 2 packs a day.   Social Determinants of Health   Financial Resource Strain: Low Risk    Difficulty of Paying Living Expenses: Not very hard  Food Insecurity: No Food Insecurity   Worried About Running Out of Food in the Last Year: Never true   Ran Out  of Food in the Last Year: Never true  Transportation Needs: No Transportation Needs   Lack of Transportation (Medical): No   Lack of Transportation (Non-Medical): No  Physical Activity: Not on file  Stress: Not on file  Social Connections: Not on file  Intimate Partner Violence: Not on file     BP 108/64    Pulse 85    Ht 4\' 9"  (1.448 m)    Wt 109 lb (49.4 kg)    SpO2 96%    BMI 23.59 kg/m   Physical Exam:  Well appearing 66 yo woman, NAD HEENT: Unremarkable Neck:  No JVD, no thyromegally Lymphatics:  No adenopathy Back:  No CVA tenderness Lungs:  Clear HEART:  Regular rate rhythm, no murmurs, no rubs, no clicks Abd:  soft, positive bowel sounds, no organomegally, no rebound, no guarding Ext:  2 plus pulses, no edema, no cyanosis, no clubbing Skin:  No rashes no nodules Neuro:  CN II through XII intact, motor grossly intact  EKG - NSR with AS MI  Assess/Plan:  ICM - I have discussed the treatment options with the patient and recommended ICD insertion for primary prevention of malignant arrhythmias. She will continue medical therapy as well. Tobacco abuse - I have strongly encouraged the patient to stop smoking. Despite her near fatal MI and remote lung CA, she continues to smoke. Chronic systolic heart failure - her  symptoms are class 2. She is on GDMT. She is not on an ACE inhibitor due to hypotension. Hopefully with time this can be started.  Carleene Overlie Chriss Redel,MD

## 2021-11-19 ENCOUNTER — Encounter (HOSPITAL_COMMUNITY)
Admission: RE | Disposition: A | Discharge status: Home / Self Care | Payer: Self-pay | Source: Home / Self Care | Attending: Internal Medicine

## 2021-11-19 ENCOUNTER — Ambulatory Visit (HOSPITAL_COMMUNITY): Payer: Medicaid Other

## 2021-11-19 ENCOUNTER — Other Ambulatory Visit: Payer: Self-pay

## 2021-11-19 ENCOUNTER — Ambulatory Visit (HOSPITAL_COMMUNITY)
Admission: RE | Admit: 2021-11-19 | Discharge: 2021-11-19 | Disposition: A | Discharge status: Home / Self Care | Payer: Medicaid Other | Attending: Internal Medicine | Admitting: Internal Medicine

## 2021-11-19 DIAGNOSIS — I255 Ischemic cardiomyopathy: Secondary | ICD-10-CM

## 2021-11-19 DIAGNOSIS — F17210 Nicotine dependence, cigarettes, uncomplicated: Secondary | ICD-10-CM | POA: Insufficient documentation

## 2021-11-19 DIAGNOSIS — Z9221 Personal history of antineoplastic chemotherapy: Secondary | ICD-10-CM | POA: Insufficient documentation

## 2021-11-19 DIAGNOSIS — I5022 Chronic systolic (congestive) heart failure: Secondary | ICD-10-CM | POA: Diagnosis not present

## 2021-11-19 DIAGNOSIS — Z95810 Presence of automatic (implantable) cardiac defibrillator: Secondary | ICD-10-CM

## 2021-11-19 DIAGNOSIS — I252 Old myocardial infarction: Secondary | ICD-10-CM | POA: Insufficient documentation

## 2021-11-19 DIAGNOSIS — Z85118 Personal history of other malignant neoplasm of bronchus and lung: Secondary | ICD-10-CM | POA: Insufficient documentation

## 2021-11-19 DIAGNOSIS — I2510 Atherosclerotic heart disease of native coronary artery without angina pectoris: Secondary | ICD-10-CM | POA: Diagnosis not present

## 2021-11-19 HISTORY — PX: ICD IMPLANT: EP1208

## 2021-11-19 SURGERY — ICD IMPLANT

## 2021-11-19 MED ORDER — FENTANYL CITRATE (PF) 100 MCG/2ML IJ SOLN
INTRAMUSCULAR | Status: DC | PRN
  Administered 2021-11-19 (×2): 12.5 ug via INTRAVENOUS
  Administered 2021-11-19: 25 ug via INTRAVENOUS

## 2021-11-19 MED ORDER — SODIUM CHLORIDE 0.9 % IV SOLN: INTRAVENOUS | Status: DC

## 2021-11-19 MED ORDER — FENTANYL CITRATE (PF) 100 MCG/2ML IJ SOLN
INTRAMUSCULAR | Status: AC
  Filled 2021-11-19: qty 2

## 2021-11-19 MED ORDER — SODIUM CHLORIDE 0.9 % IV SOLN
80.0000 mg | INTRAVENOUS | Status: AC
  Administered 2021-11-19: 80 mg

## 2021-11-19 MED ORDER — LIDOCAINE HCL (PF) 1 % IJ SOLN
INTRAMUSCULAR | Status: DC | PRN
  Administered 2021-11-19: 30 mL

## 2021-11-19 MED ORDER — POVIDONE-IODINE 10 % EX SWAB: 2.0000 "application " | Freq: Once | CUTANEOUS | Status: DC

## 2021-11-19 MED ORDER — MIDAZOLAM HCL 5 MG/5ML IJ SOLN
INTRAMUSCULAR | Status: AC
  Filled 2021-11-19: qty 5

## 2021-11-19 MED ORDER — LIDOCAINE HCL (PF) 1 % IJ SOLN
INTRAMUSCULAR | Status: AC
  Filled 2021-11-19: qty 60

## 2021-11-19 MED ORDER — MIDAZOLAM HCL 5 MG/5ML IJ SOLN
INTRAMUSCULAR | Status: DC | PRN
  Administered 2021-11-19 (×4): 1 mg via INTRAVENOUS

## 2021-11-19 MED ORDER — CHLORHEXIDINE GLUCONATE 4 % EX LIQD
4.0000 | Freq: Once | CUTANEOUS | Status: DC
Start: 2021-11-19 — End: 2021-11-19

## 2021-11-19 MED ORDER — VANCOMYCIN HCL IN DEXTROSE 1-5 GM/200ML-% IV SOLN
INTRAVENOUS | Status: AC
  Filled 2021-11-19: qty 200

## 2021-11-19 MED ORDER — ONDANSETRON HCL 4 MG/2ML IJ SOLN: 4.0000 mg | Freq: Four times a day (QID) | INTRAMUSCULAR | Status: DC | PRN

## 2021-11-19 MED ORDER — SODIUM CHLORIDE 0.9 % IV SOLN
INTRAVENOUS | Status: AC
  Filled 2021-11-19: qty 2

## 2021-11-19 MED ORDER — HEPARIN (PORCINE) IN NACL 1000-0.9 UT/500ML-% IV SOLN
INTRAVENOUS | Status: AC
  Filled 2021-11-19: qty 500

## 2021-11-19 MED ORDER — HEPARIN (PORCINE) IN NACL 1000-0.9 UT/500ML-% IV SOLN
INTRAVENOUS | Status: DC | PRN
  Administered 2021-11-19: 500 mL

## 2021-11-19 MED ORDER — ACETAMINOPHEN 325 MG PO TABS
325.0000 mg | ORAL_TABLET | ORAL | Status: DC | PRN
  Filled 2021-11-19: qty 2

## 2021-11-19 MED ORDER — VANCOMYCIN HCL IN DEXTROSE 1-5 GM/200ML-% IV SOLN
1000.0000 mg | INTRAVENOUS | Status: AC
  Administered 2021-11-19: 1000 mg via INTRAVENOUS

## 2021-11-19 SURGICAL SUPPLY — 6 items
CABLE SURGICAL S-101-97-12 (CABLE) ×2 IMPLANT
ICD VISIA MRI DVFB1D1 (ICD Generator) ×1 IMPLANT
LEAD SPRINT QUAT SEC 6935-58CM (Lead) ×1 IMPLANT
PAD DEFIB RADIO PHYSIO CONN (PAD) ×2 IMPLANT
SHEATH 9FR PRELUDE SNAP 13 (SHEATH) ×1 IMPLANT
TRAY PACEMAKER INSERTION (PACKS) ×2 IMPLANT

## 2021-11-19 NOTE — Interval H&P Note (Signed)
History and Physical Interval Note:  11/19/2021 1:10 PM  Colleen Fleming  has presented today for surgery, with the diagnosis of cardiomyopathy.  The various methods of treatment have been discussed with the patient and family. After consideration of risks, benefits and other options for treatment, the patient has consented to  Procedure(s): ICD IMPLANT (N/A) as a surgical intervention.  The patient's history has been reviewed, patient examined, no change in status, stable for surgery.  I have reviewed the patient's chart and labs.  Questions were answered to the patient's satisfaction.     Colleen Fleming

## 2021-11-19 NOTE — Progress Notes (Signed)
Report called and given to [place], Therapist, sports at [place]. RN verbalized understanding. D/C instructions also went over with [name] (Son) and patient. Patient continues to be non-compliant with instructions. Will continue to provide education.

## 2021-11-19 NOTE — Discharge Instructions (Signed)
° ° °  Supplemental Discharge Instructions for  Pacemaker/Defibrillator Patients  Tomorrow, 11/20/21, send in a device transmission  Activity No heavy lifting or vigorous activity with your left/right arm for 6 to 8 weeks.  Do not raise your left/right arm above your head for one week.  Gradually raise your affected arm as drawn below.              11/23/21                    11/24/21                     11/25/21                 11/26/21 __  NO DRIVING for  1 week  ; you may begin driving on  5/45/62 .  WOUND CARE Keep the wound area clean and dry.  Do not get this area wet , no showers for one week; you may shower on 11/26/21  . Tomorrow, 11/20/21, remove the arm sling Tomorrow, 11/20/21 remove the LARGE outer plastic bandage.  Underneath the plastic bandage there are steri strips (paper tapes), DO NOT remove these. The tape/steri-strips on your wound will fall off; do not pull them off.  No bandage is needed on the site.  DO  NOT apply any creams, oils, or ointments to the wound area. If you notice any drainage or discharge from the wound, any swelling or bruising at the site, or you develop a fever > 101? F after you are discharged home, call the office at once.  Special Instructions You are still able to use cellular telephones; use the ear opposite the side where you have your pacemaker/defibrillator.  Avoid carrying your cellular phone near your device. When traveling through airports, show security personnel your identification card to avoid being screened in the metal detectors.  Ask the security personnel to use the hand wand. Avoid arc welding equipment, MRI testing (magnetic resonance imaging), TENS units (transcutaneous nerve stimulators).  Call the office for questions about other devices. Avoid electrical appliances that are in poor condition or are not properly grounded. Microwave ovens are safe to be near or to operate.  Additional information for defibrillator patients should your  device go off: If your device goes off ONCE and you feel fine afterward, notify the device clinic nurses. If your device goes off ONCE and you do not feel well afterward, call 911. If your device goes off TWICE, call 911. If your device goes off THREE times in one day, call 911.  DO NOT DRIVE YOURSELF OR A FAMILY MEMBER WITH A DEFIBRILLATOR TO THE HOSPITAL--CALL 911.

## 2021-11-22 ENCOUNTER — Telehealth: Payer: Self-pay

## 2021-11-22 ENCOUNTER — Encounter (HOSPITAL_COMMUNITY): Payer: Self-pay | Admitting: Internal Medicine

## 2021-11-22 NOTE — Telephone Encounter (Signed)
Follow-up after same day discharge: Implant date: 11/19/21 MD: [name], MD Device: Medtronic  Single Chamber ICD Location: Left Chest   Wound check visit: 12/01/21 at 10:40 90 day MD follow-up: 02/18/22 at 2:45  Remote Transmission received: none to date-contacting rehab facility to assess for monitor  Dressing removed:     Patient residing at [facility]. Number call is son, however not on DPR. Attempted to contact patient nurse at rehab. Message left requesting call back to [phone].

## 2021-11-22 NOTE — Telephone Encounter (Signed)
-----   Message from [place], [place] sent at 11/20/2021  7:27 AM EST ----- Same day d/c  2/17   GT MDT  ICD

## 2021-11-23 NOTE — Telephone Encounter (Signed)
Spoke to Colleen Fleming at [facility] and requested monitor to be plugged in beside patients bed within 4-6 feet. Anderson Malta reports the large dressing was removed from patient. Advised if noted any swelling drainage, bleeding, redness, fever or chills, be sure to call the device clinic. Lifting restrictions reviewed as well. Advised to call if further questions or concerns arise.

## 2021-12-01 ENCOUNTER — Other Ambulatory Visit: Payer: Self-pay

## 2021-12-01 ENCOUNTER — Ambulatory Visit (INDEPENDENT_AMBULATORY_CARE_PROVIDER_SITE_OTHER): Payer: Medicaid Other

## 2021-12-01 DIAGNOSIS — I255 Ischemic cardiomyopathy: Secondary | ICD-10-CM

## 2021-12-01 LAB — CUP PACEART INCLINIC DEVICE CHECK
Battery Remaining Longevity: 132 mo
Battery Voltage: 3.01 V
Brady Statistic RV Percent Paced: 0.01 %
Date Time Interrogation Session: 20230301110302
HighPow Impedance: 54 Ohm
Implantable Lead Implant Date: 20230217
Implantable Lead Location: 753860
Implantable Lead Model: 6935
Implantable Pulse Generator Implant Date: 20230217
Lead Channel Impedance Value: 399 Ohm
Lead Channel Impedance Value: 456 Ohm
Lead Channel Pacing Threshold Amplitude: 0.5 V
Lead Channel Pacing Threshold Pulse Width: 0.4 ms
Lead Channel Sensing Intrinsic Amplitude: 7.25 mV
Lead Channel Sensing Intrinsic Amplitude: 7.75 mV
Lead Channel Setting Pacing Amplitude: 3.5 V
Lead Channel Setting Pacing Pulse Width: 0.4 ms
Lead Channel Setting Sensing Sensitivity: 0.3 mV

## 2021-12-01 NOTE — Patient Instructions (Signed)

## 2021-12-01 NOTE — Progress Notes (Signed)

## 2022-01-18 ENCOUNTER — Ambulatory Visit: Payer: Medicaid Other | Admitting: Cardiology

## 2022-01-24 ENCOUNTER — Encounter: Payer: Self-pay | Admitting: Cardiology

## 2022-02-18 ENCOUNTER — Encounter: Payer: Medicaid Other | Admitting: Internal Medicine

## 2022-02-21 ENCOUNTER — Ambulatory Visit (INDEPENDENT_AMBULATORY_CARE_PROVIDER_SITE_OTHER): Payer: Medicaid Other

## 2022-02-21 ENCOUNTER — Encounter: Payer: Medicaid Other | Admitting: Internal Medicine

## 2022-02-21 DIAGNOSIS — I255 Ischemic cardiomyopathy: Secondary | ICD-10-CM | POA: Diagnosis not present

## 2022-02-22 LAB — CUP PACEART REMOTE DEVICE CHECK
Battery Remaining Longevity: 130 mo
Battery Voltage: 3.07 V
Brady Statistic RV Percent Paced: 0.01 %
Date Time Interrogation Session: 20230522022722
HighPow Impedance: 56 Ohm
Implantable Lead Implant Date: 20230217
Implantable Lead Location: 753860
Implantable Lead Model: 6935
Implantable Pulse Generator Implant Date: 20230217
Lead Channel Impedance Value: 361 Ohm
Lead Channel Impedance Value: 456 Ohm
Lead Channel Pacing Threshold Amplitude: 0.625 V
Lead Channel Pacing Threshold Pulse Width: 0.4 ms
Lead Channel Sensing Intrinsic Amplitude: 8.875 mV
Lead Channel Sensing Intrinsic Amplitude: 8.875 mV
Lead Channel Setting Pacing Amplitude: 2 V
Lead Channel Setting Pacing Pulse Width: 0.4 ms
Lead Channel Setting Sensing Sensitivity: 0.3 mV

## 2022-03-10 NOTE — Progress Notes (Signed)
Remote ICD transmission.   

## 2022-05-18 DIAGNOSIS — F341 Dysthymic disorder: Secondary | ICD-10-CM | POA: Insufficient documentation

## 2022-05-23 ENCOUNTER — Ambulatory Visit (INDEPENDENT_AMBULATORY_CARE_PROVIDER_SITE_OTHER): Payer: Medicaid Other

## 2022-05-23 DIAGNOSIS — I255 Ischemic cardiomyopathy: Secondary | ICD-10-CM | POA: Diagnosis not present

## 2022-05-23 DIAGNOSIS — I5022 Chronic systolic (congestive) heart failure: Secondary | ICD-10-CM

## 2022-05-24 LAB — CUP PACEART REMOTE DEVICE CHECK
Battery Remaining Longevity: 128 mo
Battery Voltage: 3.05 V
Brady Statistic RV Percent Paced: 0.02 %
Date Time Interrogation Session: 20230821022724
HighPow Impedance: 82 Ohm
Implantable Lead Implant Date: 20230217
Implantable Lead Location: 753860
Implantable Lead Model: 6935
Implantable Pulse Generator Implant Date: 20230217
Lead Channel Impedance Value: 475 Ohm
Lead Channel Impedance Value: 551 Ohm
Lead Channel Pacing Threshold Amplitude: 0.75 V
Lead Channel Pacing Threshold Pulse Width: 0.4 ms
Lead Channel Sensing Intrinsic Amplitude: 13.25 mV
Lead Channel Sensing Intrinsic Amplitude: 13.25 mV
Lead Channel Setting Pacing Amplitude: 2 V
Lead Channel Setting Pacing Pulse Width: 0.4 ms
Lead Channel Setting Sensing Sensitivity: 0.3 mV

## 2022-06-03 ENCOUNTER — Inpatient Hospital Stay (HOSPITAL_COMMUNITY)
Admission: EM | Admit: 2022-06-03 | Discharge: 2022-06-10 | DRG: 640 | Disposition: A | Discharge status: Nursing Home (Includes ICF) | Payer: Medicaid Other | Attending: Family Medicine | Admitting: Family Medicine

## 2022-06-03 ENCOUNTER — Other Ambulatory Visit: Payer: Self-pay

## 2022-06-03 ENCOUNTER — Encounter (HOSPITAL_COMMUNITY): Payer: Self-pay | Admitting: Emergency Medicine

## 2022-06-03 DIAGNOSIS — I25110 Atherosclerotic heart disease of native coronary artery with unstable angina pectoris: Secondary | ICD-10-CM | POA: Diagnosis present

## 2022-06-03 DIAGNOSIS — Z20822 Contact with and (suspected) exposure to covid-19: Secondary | ICD-10-CM | POA: Diagnosis present

## 2022-06-03 DIAGNOSIS — Z79899 Other long term (current) drug therapy: Secondary | ICD-10-CM

## 2022-06-03 DIAGNOSIS — I9589 Other hypotension: Secondary | ICD-10-CM | POA: Diagnosis present

## 2022-06-03 DIAGNOSIS — F17210 Nicotine dependence, cigarettes, uncomplicated: Secondary | ICD-10-CM | POA: Diagnosis present

## 2022-06-03 DIAGNOSIS — I5042 Chronic combined systolic (congestive) and diastolic (congestive) heart failure: Secondary | ICD-10-CM | POA: Diagnosis not present

## 2022-06-03 DIAGNOSIS — Z923 Personal history of irradiation: Secondary | ICD-10-CM

## 2022-06-03 DIAGNOSIS — R4189 Other symptoms and signs involving cognitive functions and awareness: Secondary | ICD-10-CM | POA: Diagnosis present

## 2022-06-03 DIAGNOSIS — Z85118 Personal history of other malignant neoplasm of bronchus and lung: Secondary | ICD-10-CM

## 2022-06-03 DIAGNOSIS — F0390 Unspecified dementia without behavioral disturbance: Secondary | ICD-10-CM | POA: Diagnosis present

## 2022-06-03 DIAGNOSIS — R1011 Right upper quadrant pain: Secondary | ICD-10-CM | POA: Diagnosis present

## 2022-06-03 DIAGNOSIS — E871 Hypo-osmolality and hyponatremia: Secondary | ICD-10-CM | POA: Diagnosis not present

## 2022-06-03 DIAGNOSIS — B9620 Unspecified Escherichia coli [E. coli] as the cause of diseases classified elsewhere: Secondary | ICD-10-CM | POA: Diagnosis present

## 2022-06-03 DIAGNOSIS — E876 Hypokalemia: Secondary | ICD-10-CM | POA: Diagnosis not present

## 2022-06-03 DIAGNOSIS — Z7982 Long term (current) use of aspirin: Secondary | ICD-10-CM

## 2022-06-03 DIAGNOSIS — Z993 Dependence on wheelchair: Secondary | ICD-10-CM

## 2022-06-03 DIAGNOSIS — J431 Panlobular emphysema: Secondary | ICD-10-CM | POA: Diagnosis present

## 2022-06-03 DIAGNOSIS — F32A Depression, unspecified: Secondary | ICD-10-CM | POA: Diagnosis present

## 2022-06-03 DIAGNOSIS — N390 Urinary tract infection, site not specified: Secondary | ICD-10-CM | POA: Diagnosis present

## 2022-06-03 DIAGNOSIS — I252 Old myocardial infarction: Secondary | ICD-10-CM

## 2022-06-03 DIAGNOSIS — I255 Ischemic cardiomyopathy: Secondary | ICD-10-CM | POA: Diagnosis present

## 2022-06-03 DIAGNOSIS — I2510 Atherosclerotic heart disease of native coronary artery without angina pectoris: Secondary | ICD-10-CM | POA: Diagnosis present

## 2022-06-03 DIAGNOSIS — E860 Dehydration: Principal | ICD-10-CM | POA: Diagnosis present

## 2022-06-03 DIAGNOSIS — F419 Anxiety disorder, unspecified: Secondary | ICD-10-CM | POA: Diagnosis present

## 2022-06-03 DIAGNOSIS — R197 Diarrhea, unspecified: Secondary | ICD-10-CM | POA: Diagnosis present

## 2022-06-03 DIAGNOSIS — G9341 Metabolic encephalopathy: Secondary | ICD-10-CM | POA: Diagnosis present

## 2022-06-03 DIAGNOSIS — Z880 Allergy status to penicillin: Secondary | ICD-10-CM

## 2022-06-03 DIAGNOSIS — K219 Gastro-esophageal reflux disease without esophagitis: Secondary | ICD-10-CM | POA: Diagnosis present

## 2022-06-03 DIAGNOSIS — E785 Hyperlipidemia, unspecified: Secondary | ICD-10-CM | POA: Diagnosis present

## 2022-06-03 DIAGNOSIS — G40909 Epilepsy, unspecified, not intractable, without status epilepticus: Secondary | ICD-10-CM | POA: Diagnosis present

## 2022-06-03 LAB — BASIC METABOLIC PANEL
Anion gap: 13 (ref 5–15)
BUN: 8 mg/dL (ref 8–23)
CO2: 27 mmol/L (ref 22–32)
Calcium: 8.6 mg/dL — ABNORMAL LOW (ref 8.9–10.3)
Chloride: 81 mmol/L — ABNORMAL LOW (ref 98–111)
Creatinine, Ser: 1.09 mg/dL — ABNORMAL HIGH (ref 0.44–1.00)
GFR, Estimated: 57 mL/min — ABNORMAL LOW (ref >60)
Glucose, Bld: 110 mg/dL — ABNORMAL HIGH (ref 70–99)
Potassium: 2.9 mmol/L — ABNORMAL LOW (ref 3.5–5.1)
Sodium: 121 mmol/L — ABNORMAL LOW (ref 135–145)

## 2022-06-03 LAB — MAGNESIUM: Magnesium: 1.5 mg/dL — ABNORMAL LOW (ref 1.7–2.4)

## 2022-06-03 LAB — C DIFFICILE QUICK SCREEN W PCR REFLEX
C Diff antigen: NEGATIVE
C Diff interpretation: NOT DETECTED
C Diff toxin: NEGATIVE

## 2022-06-03 LAB — CBC
HCT: 37.6 % (ref 36.0–46.0)
Hemoglobin: 13.5 g/dL (ref 12.0–15.0)
MCH: 31.8 pg (ref 26.0–34.0)
MCHC: 35.9 g/dL (ref 30.0–36.0)
MCV: 88.5 fL (ref 80.0–100.0)
Platelets: 252 10*3/uL (ref 150–400)
RBC: 4.25 MIL/uL (ref 3.87–5.11)
RDW: 12.8 % (ref 11.5–15.5)
WBC: 7 10*3/uL (ref 4.0–10.5)
nRBC: 0 % (ref 0.0–0.2)

## 2022-06-03 LAB — TROPONIN I (HIGH SENSITIVITY)
Troponin I (High Sensitivity): 25 ng/L — ABNORMAL HIGH (ref <18)
Troponin I (High Sensitivity): 24 ng/L — ABNORMAL HIGH (ref <18)

## 2022-06-03 LAB — SARS CORONAVIRUS 2 BY RT PCR: SARS Coronavirus 2 by RT PCR: NEGATIVE

## 2022-06-03 MED ORDER — POTASSIUM CHLORIDE CRYS ER 20 MEQ PO TBCR
40.0000 meq | EXTENDED_RELEASE_TABLET | Freq: Once | ORAL | Status: AC
  Administered 2022-06-03: 40 meq via ORAL
  Filled 2022-06-03: qty 2

## 2022-06-03 MED ORDER — DIVALPROEX SODIUM 125 MG PO CSDR
125.0000 mg | DELAYED_RELEASE_CAPSULE | Freq: Two times a day (BID) | ORAL | Status: DC
  Administered 2022-06-03 – 2022-06-10 (×14): 125 mg via ORAL
  Filled 2022-06-03 (×14): qty 1

## 2022-06-03 MED ORDER — MAGNESIUM SULFATE 2 GM/50ML IV SOLN
2.0000 g | Freq: Once | INTRAVENOUS | Status: AC
  Administered 2022-06-03: 2 g via INTRAVENOUS
  Filled 2022-06-03: qty 50

## 2022-06-03 MED ORDER — ACETAMINOPHEN 650 MG RE SUPP: 650.0000 mg | Freq: Four times a day (QID) | RECTAL | Status: DC | PRN

## 2022-06-03 MED ORDER — POTASSIUM CHLORIDE IN NACL 40-0.9 MEQ/L-% IV SOLN: INTRAVENOUS | Status: DC

## 2022-06-03 MED ORDER — ONDANSETRON HCL 4 MG/2ML IJ SOLN: 4.0000 mg | Freq: Four times a day (QID) | INTRAMUSCULAR | Status: DC | PRN

## 2022-06-03 MED ORDER — ASPIRIN 81 MG PO TBEC
81.0000 mg | DELAYED_RELEASE_TABLET | Freq: Every morning | ORAL | Status: DC
  Administered 2022-06-04 – 2022-06-10 (×7): 81 mg via ORAL
  Filled 2022-06-03 (×7): qty 1

## 2022-06-03 MED ORDER — MIDODRINE HCL 5 MG PO TABS
2.5000 mg | ORAL_TABLET | Freq: Two times a day (BID) | ORAL | Status: DC
Start: 2022-06-04 — End: 2022-06-05
  Administered 2022-06-05 (×2): 2.5 mg via ORAL
  Filled 2022-06-03 (×4): qty 1

## 2022-06-03 MED ORDER — ONDANSETRON HCL 4 MG PO TABS: 4.0000 mg | ORAL_TABLET | Freq: Four times a day (QID) | ORAL | Status: DC | PRN

## 2022-06-03 MED ORDER — ENOXAPARIN SODIUM 30 MG/0.3ML IJ SOSY
30.0000 mg | PREFILLED_SYRINGE | INTRAMUSCULAR | Status: DC
  Administered 2022-06-03 – 2022-06-06 (×4): 30 mg via SUBCUTANEOUS
  Filled 2022-06-03 (×4): qty 0.3

## 2022-06-03 MED ORDER — CALCIUM CARBONATE ANTACID 500 MG PO CHEW
1.0000 | CHEWABLE_TABLET | Freq: Once | ORAL | Status: AC
Start: 2022-06-03 — End: 2022-06-03
  Administered 2022-06-03: 200 mg via ORAL
  Filled 2022-06-03: qty 1

## 2022-06-03 MED ORDER — ENSURE ENLIVE PO LIQD
237.0000 mL | Freq: Two times a day (BID) | ORAL | Status: DC
  Administered 2022-06-03 – 2022-06-10 (×10): 237 mL via ORAL

## 2022-06-03 MED ORDER — DIGOXIN 125 MCG PO TABS
62.5000 ug | ORAL_TABLET | Freq: Every morning | ORAL | Status: DC
  Administered 2022-06-05 – 2022-06-10 (×6): 62.5 ug via ORAL
  Filled 2022-06-03 (×6): qty 1

## 2022-06-03 MED ORDER — ENOXAPARIN SODIUM 40 MG/0.4ML IJ SOSY: 40.0000 mg | PREFILLED_SYRINGE | INTRAMUSCULAR | Status: DC

## 2022-06-03 MED ORDER — NYSTATIN 100000 UNIT/GM EX POWD
Freq: Two times a day (BID) | CUTANEOUS | Status: DC
  Filled 2022-06-03 (×2): qty 15

## 2022-06-03 MED ORDER — ATORVASTATIN CALCIUM 40 MG PO TABS
80.0000 mg | ORAL_TABLET | Freq: Every day | ORAL | Status: DC
  Administered 2022-06-03 – 2022-06-10 (×8): 80 mg via ORAL
  Filled 2022-06-03 (×8): qty 2

## 2022-06-03 MED ORDER — TICAGRELOR 90 MG PO TABS
90.0000 mg | ORAL_TABLET | Freq: Two times a day (BID) | ORAL | Status: DC
  Administered 2022-06-03 – 2022-06-10 (×14): 90 mg via ORAL
  Filled 2022-06-03 (×14): qty 1

## 2022-06-03 MED ORDER — FLUOXETINE HCL 10 MG PO CAPS
10.0000 mg | ORAL_CAPSULE | Freq: Every day | ORAL | Status: DC
  Administered 2022-06-03 – 2022-06-10 (×8): 10 mg via ORAL
  Filled 2022-06-03 (×8): qty 1

## 2022-06-03 MED ORDER — SODIUM CHLORIDE 0.9 % IV BOLUS
1000.0000 mL | Freq: Once | INTRAVENOUS | Status: AC
  Administered 2022-06-03: 1000 mL via INTRAVENOUS

## 2022-06-03 MED ORDER — OLANZAPINE 5 MG PO TABS
2.5000 mg | ORAL_TABLET | Freq: Every day | ORAL | Status: DC | PRN
Start: 2022-06-03 — End: 2022-06-04
  Administered 2022-06-03: 2.5 mg via ORAL
  Filled 2022-06-03: qty 1

## 2022-06-03 MED ORDER — POTASSIUM CHLORIDE 10 MEQ/100ML IV SOLN
10.0000 meq | INTRAVENOUS | Status: AC
  Administered 2022-06-03: 10 meq via INTRAVENOUS
  Filled 2022-06-03: qty 100

## 2022-06-03 MED ORDER — ACETAMINOPHEN 325 MG PO TABS
650.0000 mg | ORAL_TABLET | Freq: Four times a day (QID) | ORAL | Status: DC | PRN
  Administered 2022-06-03 – 2022-06-05 (×3): 650 mg via ORAL
  Filled 2022-06-03 (×3): qty 2

## 2022-06-03 NOTE — ED Provider Notes (Signed)
[facility] EMERGENCY DEPARTMENT Provider Note   CSN: [id number] Arrival date & time: 06/03/22  1117     History  Chief Complaint  Patient presents with   Weakness    Colleen Fleming is a 66 y.o. female presenting from assisted living facility with concern for generalized weakness.  The patient has dementia and her son at bedside provides additional history.  He reports that he was contacted today that the patient's outpatient blood work was abnormal, that she had a sodium of 120 and potassium of 3.1 was told the magnesium was low as well.  He was told to bring her to the ER that she may require hospitalization.  The patient resolve is no complaints.  The patient does have frequent diarrheas and loose stools.  She is wheelchair-bound due to deconditioning.  Medical record review shows that the patient had a BMP performed yesterday with sodium of 120, potassium of 3.1.  Magnesium is 1.3.  HPI     Home Medications Prior to Admission medications   Medication Sig Start Date End Date Taking? Authorizing Provider  acetaminophen (TYLENOL) 500 MG tablet Take 500 mg by mouth every 6 (six) hours as needed for mild pain.   Yes [provider]  alum & mag hydroxide-simeth (MAALOX/MYLANTA) 200-200-20 MG/5ML suspension Take 30 mLs by mouth every 6 (six) hours as needed for indigestion or heartburn.   Yes [provider]  aspirin EC 81 MG tablet Take 81 mg by mouth in the morning. (0900) Swallow whole.   Yes [provider]  atorvastatin (LIPITOR) 80 MG tablet Take 1 tablet (80 mg total) by mouth daily. 02/05/21  Yes Lyda Jester M, [name]  calcium carbonate (TUMS EX) 750 MG chewable tablet Chew 2 tablets by mouth daily as needed for heartburn.   Yes [provider]  Cholecalciferol (VITAMIN D3) 25 MCG (1000 UT) CAPS Take 1 capsule by mouth daily. 02/10/22  Yes [provider]  DEPAKOTE SPRINKLES 125 MG capsule Take 125 mg by mouth 2 (two) times daily.  01/31/22  Yes [provider]  Digoxin 62.5 MCG TABS Take 62.5 mcg by mouth in the morning. (0900)   Yes [provider]  furosemide (LASIX) 40 MG tablet Take 1 tablet (40 mg total) by mouth daily as needed for fluid or edema (for weight gain more than 3 lbs in a day , leg edema). 02/20/21 06/03/22 Yes Barb Merino, MD  ibuprofen (ADVIL) 800 MG tablet Take 800 mg by mouth every 8 (eight) hours as needed. 03/31/22  Yes [provider]  lisinopril (ZESTRIL) 10 MG tablet Take 10 mg by mouth in the morning and at bedtime.   Yes [provider]  loperamide (IMODIUM) 2 MG capsule Take 2 mg by mouth as needed for diarrhea or loose stools.   Yes [provider]  metoprolol succinate (TOPROL XL) 25 MG 24 hr tablet Take 0.5 tablets (12.5 mg total) by mouth daily. Patient taking differently: Take 25 mg by mouth in the morning and at bedtime. (2000) 08/16/21  Yes Leonie Man, MD  midodrine (PROAMATINE) 2.5 MG tablet Only give if Systolic blood pressure is less than 105 mmhg Patient taking differently: Take 2.5 mg by mouth in the morning and at bedtime. (0800 & 2000) Only give if Systolic blood pressure is less than 105 mmhg 09/21/21  Yes Swinyer, Lanice Schwab, NP  OLANZapine (ZYPREXA) 2.5 MG tablet Take 2.5 mg by mouth daily as needed (agitation/paranoia).   Yes [provider]  omeprazole (PRILOSEC) 20 MG capsule Take 20 mg by mouth daily.   Yes [provider]  potassium chloride SA (KLOR-CON) 20 MEQ tablet Take 1 tablet (20 mEq total) by mouth daily as needed (when given lasix). Patient taking differently: Take 10-20 mEq by mouth daily. 39mEq daily and 79mEq as needed 02/20/21  Yes Ghimire, Dante Gang, MD  PROZAC 10 MG capsule Take 10 mg by mouth daily. 01/31/22  Yes [provider]  sodium fluoride (PREVIDENT 5000 PLUS) 1.1 % CREA dental cream Place 1 application onto teeth every evening. (2100)   Yes [provider]  ticagrelor  (BRILINTA) 90 MG TABS tablet Take 1 tablet (90 mg total) by mouth 2 (two) times daily. 02/04/21  Yes Lyda Jester M, [name]  nitroGLYCERIN (NITROSTAT) 0.4 MG SL tablet Place 1 tablet (0.4 mg total) under the tongue every 5 (five) minutes x 3 doses as needed for chest pain. 02/04/21   Consuelo Pandy, [name]      Allergies    Penicillins    Review of Systems   Review of Systems  Physical Exam Updated Vital Signs BP 101/89 (BP Location: Right Arm)   Pulse 82   Temp 98.3 F (36.8 C) (Oral)   Resp 18   Ht 4\' 9"  (1.448 m)   Wt 48.7 kg   SpO2 100%   BMI 23.23 kg/m  Physical Exam Constitutional:      General: She is not in acute distress. HENT:     Head: Normocephalic and atraumatic.  Eyes:     Conjunctiva/sclera: Conjunctivae normal.     Pupils: Pupils are equal, round, and reactive to light.  Cardiovascular:     Rate and Rhythm: Normal rate and regular rhythm.  Pulmonary:     Effort: Pulmonary effort is normal. No respiratory distress.  Abdominal:     General: There is no distension.     Tenderness: There is no abdominal tenderness.  Skin:    General: Skin is warm and dry.  Neurological:     General: No focal deficit present.     Mental Status: She is alert. Mental status is at baseline.  Psychiatric:        Mood and Affect: Mood normal.        Behavior: Behavior normal.     ED Results / Procedures / Treatments   Labs (all labs ordered are listed, but only abnormal results are displayed) Labs Reviewed  BASIC METABOLIC PANEL - Abnormal; Notable for the following components:      Result Value   Sodium 121 (*)    Potassium 2.9 (*)    Chloride 81 (*)    Glucose, Bld 110 (*)    Creatinine, Ser 1.09 (*)    Calcium 8.6 (*)    GFR, Estimated 57 (*)    All other components within normal limits  MAGNESIUM - Abnormal; Notable for the following components:   Magnesium 1.5 (*)    All other components within normal limits  TROPONIN I (HIGH SENSITIVITY) - Abnormal;  Notable for the following components:   Troponin I (High Sensitivity) 25 (*)    All other components within normal limits  TROPONIN I (HIGH SENSITIVITY) - Abnormal; Notable for the following components:   Troponin I (High Sensitivity) 24 (*)    All other components within normal limits  SARS CORONAVIRUS 2 BY RT PCR  C DIFFICILE QUICK SCREEN W PCR REFLEX    CBC  URINALYSIS, ROUTINE W REFLEX MICROSCOPIC    EKG None  Radiology No results found.  Procedures .Critical Care  Performed by: Wyvonnia Dusky, MD Authorized by: Wyvonnia Dusky, MD   Critical care provider statement:    Critical care time (minutes):  45   Critical care time was exclusive of:  Separately billable procedures and treating other patients   Critical care was necessary to treat or prevent imminent or life-threatening deterioration of the following conditions:  Metabolic crisis   Critical care was time spent personally by me on the following activities:  Ordering and performing treatments and interventions, ordering and review of laboratory studies, ordering and review of radiographic studies, pulse oximetry, review of old charts, examination of patient and evaluation of patient's response to treatment Comments:     Electrolyte repletion     Medications Ordered in ED Medications  potassium chloride 10 mEq in 100 mL IVPB (0 mEq Intravenous Stopped 06/03/22 1408)  feeding supplement (ENSURE ENLIVE / ENSURE PLUS) liquid 237 mL (has no administration in time range)  sodium chloride 0.9 % bolus 1,000 mL (1,000 mLs Intravenous New Bag/Given 06/03/22 1311)  potassium chloride SA (KLOR-CON M) CR tablet 40 mEq (40 mEq Oral Given 06/03/22 1314)  magnesium sulfate IVPB 2 g 50 mL (0 g Intravenous Stopped 06/03/22 1408)    ED Course/ Medical Decision Making/ A&P                           Medical Decision Making Amount and/or Complexity of Data Reviewed Labs: ordered.  Risk Prescription drug management. Decision  regarding hospitalization.   This patient presents to the ED with concern for generalized weakness.  This involves an extensive number of treatment options, and is a complaint that carries with it a high risk of complications and morbidity.  The differential diagnosis includes electrolyte derangement versus anemia versus arrhythmia versus other  Co-morbidities that complicate the patient evaluation: History of coronary disease and cardiac disease at high risk for cardiovascular complications  Additional history obtained from patient's son at the bedside  External records from outside source obtained and reviewed including ICD pacemaker implant -his maker report from 2 weeks ago May 23, 2022, per neurologist note no emergent changes recommended  I ordered and personally interpreted labs.  The pertinent results include:  low Na, K, Mg   The patient was maintained on a cardiac monitor.  I personally viewed and interpreted the cardiac monitored which showed an underlying rhythm of: Sinus rhythm  Per my interpretation the patient's ECG shows sinus rhythm with some ST depressions which could be consistent with electrolyte derangement.  I ordered medication including normal saline for hyponatremia, potassium repletion, magnesium repletion  I have reviewed the patients home medicines and have made adjustments as needed  Test Considered: Low suspicion for stroke, acute PE, sepsis at this time  After the interventions noted above, I reevaluated the patient and found that they have: stayed the same   Dispostion:  After consideration of the diagnostic results and the patients response to treatment, I feel that the patent would benefit from medical admission for electrolyte repletion         Final Clinical Impression(s) / ED Diagnoses Final diagnoses:  Hyponatremia  Hypokalemia  Hypomagnesemia    Rx / DC Orders ED Discharge Orders     None         Creighton Longley, Carola Rhine,  MD 06/03/22 1708

## 2022-06-03 NOTE — Plan of Care (Signed)

## 2022-06-03 NOTE — H&P (Signed)
History and Physical    Colleen Fleming EGB:[id number] DOB: 16-May-1956 DOA: 06/03/2022  PCP: Elisabeth Cara, [name]  Patient coming from: ALF  I have personally briefly reviewed patient's old medical records in [place]  Chief Complaint: Abnormal labs, generalized weakness  HPI: Colleen Fleming is a 66 y.o. 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  Patient's son reports that 2 to 3 weeks ago, she had labs drawn that indicated some electrolyte abnormalities including low potassium and low magnesium.  Attempt was made to supplement these orally.  She had repeat labs drawn yesterday at facility and that showed persistent hypokalemia, hyponatremia and hypomagnesemia.  Her son does note that she seems to be more weak lately and has difficulty getting around.  She has had issues with loose stools for "quite some time".  She had an episode of vomiting yesterday.  She has not had any fever or dysuria.  She has not had any shortness of breath.  ED Course: She was noted to be significantly hypokalemic, hypomagnesemic and hyponatremic in the emergency room.  Blood pressure is low normal.  She is not tachycardic.  Review of Systems: As per HPI otherwise 10 point review of systems negative.    Past Medical History:  Diagnosis Date   Acute ST elevation myocardial infarction (STEMI) involving left anterior descending (LAD) coronary artery ([place]) 01/30/2021   99% thrombotic subtotal occlusion of Prox LAD => (DES PCI LAD).  apical LAD occlusion with distal embolization.  EF estimated 40-45% w/ mid-apical anterior HK; ~ normal LVEDP with SBP 98 mmHg. => Echo EF 20 to 25% with entire anterior wall hypokinesis and apical akinesis.   Anxiety and depression    Chronic combined systolic and diastolic CHF, NYHA class 2 ([place]) 02/13/2021   EF 25%.  GR 2 DD.   COPD  (chronic obstructive pulmonary disease) (HCC)    Long-term smoker greater than 35 years 1 to 2 pack a day.   Coronary artery disease involving native coronary artery of native heart with unstable angina pectoris ([place])    Presented with anterior STEMI-99% subtotal occluded proximal LAD-DES PCI (Synergy DES 2.5 mm 60 mm - 2.8 mm) distally apical thromboembolism.  Reduced EF with anterior and apical hypokinesis.   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  On midodrine for blood pressure support.  Previously not able to tolerate beta-blocker or other CHF medications.   Ischemic cardiomyopathy 08/16/2021   EF 20 to 25% following anterior STEMI, despite LAD PCI.[place] Kitchen  Unable to titrate medications due to hypotension.   Obesity    Panlobular emphysema (HCC)    Small cell lung cancer, left upper lobe ([place]) 2014   Treated with radiation and chemotherapy [facility])    Past Surgical History:  Procedure Laterality Date   CORONARY/GRAFT ACUTE MI REVASCULARIZATION N/A 01/30/2021   Procedure: Coronary/Graft Acute MI Revascularization;  Surgeon: Leonie Man, MD;  Location: [place] [facility];  Service: Cardiovascular; Prox LAD 99% (-> 0%) DES PCI (Synergy DES 2.5 mm x 16 mm - 2.61mm) -> distal embolization with apical 80% occlusion  ICD IMPLANT N/A 11/19/2021   Procedure: ICD IMPLANT;  Surgeon: Evans Lance, MD;  Location: [place] [facility];  Service: Cardiovascular;  Laterality: N/A;   LEFT HEART CATH AND CORONARY ANGIOGRAPHY N/A 01/30/2021   Procedure: LEFT HEART CATH AND CORONARY ANGIOGRAPHY;  Surgeon: Leonie Man, MD;  Location: [place] [facility];  Service: Cardiovascular; ANT STEMI: 99% thrombotic subtotal  occlusion of Prox LAD => (DES PCI LAD). Otherwise angiographically normal coronaries with exception of apical LAD occlusion with distal embolization.  EF estimated 45% w/ mid-apical anterior HK; ~ nl LVEDP w/  SBP 98 mmHg.   RIGHT HEART CATH N/A 02/15/2021   Procedure: RIGHT HEART CATH;  Surgeon: Jolaine Artist, MD;  Location: [place] [facility];  Service: CardiovRA = 2 mmHg; RV = 17/2 mmHg, PA = 17/4 (8) mmHg; PCW = 4 mmHg;   Ao sat = 98%, PA sat = 64%, 65%Fick Cardiac Output/Index = 3.0/2.4; Thermo CO/CI = 2.8/2.2; PVR = 1.3 [facility];   TRANSTHORACIC ECHOCARDIOGRAM  01/31/2021   (post Ant STEMI -PCI): EF 25-30%. Severe HK of entire Anterior/Anteroseptal wall & mild dyskinesis of anteroapical/apical segment. Gr 2 DD.  Normal RV size and function with mildly elevated.  RAP estimated 8 mm.  Small circumferential pericardial effusion.  Normal aortic and mitral valves.   TRANSTHORACIC ECHOCARDIOGRAM  02/01/2021   a) Limited: small-mod pericardial effusion primarily anterior to RV. IVC collapses ~50%. Nl RV & RAP. NO OVERT TAMPONADE; b) 5/14: Large, predominantly anterior pericardial effusion -> findings suggestive of early tamponade physiology, but no RV diastolic collapse and IVC not dilated.  RAP 3 mmHg. = EFFUSION LARGER, no TAMPONADE    Social History:  reports that she has been smoking cigarettes. She has a 20.00 pack-year smoking history. She has never used smokeless tobacco. She reports that she does not currently use alcohol. She reports that she does not use drugs.  Allergies  Allergen Reactions   Penicillins Hives    Family History  Problem Relation Age of Onset   Cancer Paternal Grandmother    Cancer Paternal Aunt      Prior to Admission medications   Medication Sig Start Date End Date Taking? Authorizing Provider  acetaminophen (TYLENOL) 500 MG tablet Take 500 mg by mouth every 6 (six) hours as needed for mild pain.   Yes [provider]  alum & mag hydroxide-simeth  (MAALOX/MYLANTA) 200-200-20 MG/5ML suspension Take 30 mLs by mouth every 6 (six) hours as needed for indigestion or heartburn.   Yes [provider]  aspirin EC 81 MG tablet Take 81 mg by mouth in the morning. (0900) Swallow whole.   Yes [provider]  atorvastatin (LIPITOR) 80 MG tablet Take 1 tablet (80 mg total) by mouth daily. 02/05/21  Yes Lyda Jester M, [name]  calcium carbonate (TUMS EX) 750 MG chewable tablet Chew 2 tablets by mouth daily as needed for heartburn.   Yes [provider]  Cholecalciferol (VITAMIN D3) 25 MCG (1000 UT) CAPS Take 1 capsule by mouth daily. 02/10/22  Yes [provider]  DEPAKOTE SPRINKLES 125 MG capsule Take 125 mg by mouth 2 (two) times daily. 01/31/22  Yes [provider]  Digoxin 62.5 MCG TABS Take 62.5 mcg by mouth in the morning. (0900)   Yes [provider]  furosemide (LASIX) 40 MG tablet Take 1 tablet (40 mg total) by mouth daily as needed for fluid or edema (for weight gain more than 3 lbs in a day , leg  edema). 02/20/21 06/03/22 Yes Barb Merino, MD  ibuprofen (ADVIL) 800 MG tablet Take 800 mg by mouth every 8 (eight) hours as needed. 03/31/22  Yes [provider]  lisinopril (ZESTRIL) 10 MG tablet Take 10 mg by mouth in the morning and at bedtime.   Yes [provider]  loperamide (IMODIUM) 2 MG capsule Take 2 mg by mouth as needed for diarrhea or loose stools.   Yes [provider]  metoprolol succinate (TOPROL XL) 25 MG 24 hr tablet Take 0.5 tablets (12.5 mg total) by mouth daily. Patient taking differently: Take 25 mg by mouth in the morning and at bedtime. (2000) 08/16/21  Yes Leonie Man, MD  midodrine (PROAMATINE) 2.5 MG tablet Only give if Systolic blood pressure is less than 105 mmhg Patient taking differently: Take 2.5 mg by mouth in the morning and at bedtime. (0800 & 2000) Only give if Systolic blood pressure is less than 105 mmhg 09/21/21  Yes Swinyer, Lanice Schwab, NP  OLANZapine (ZYPREXA) 2.5 MG tablet Take 2.5 mg by mouth daily as needed (agitation/paranoia).   Yes [provider]  omeprazole (PRILOSEC) 20 MG capsule Take 20 mg by mouth daily.   Yes [provider]  potassium chloride SA (KLOR-CON) 20 MEQ tablet Take 1 tablet (20 mEq total) by mouth daily as needed (when given lasix). Patient taking differently: Take 10-20 mEq by mouth daily. 68mEq daily and 52mEq as needed 02/20/21  Yes Ghimire, Dante Gang, MD  PROZAC 10 MG capsule Take 10 mg by mouth daily. 01/31/22  Yes [provider]  sodium fluoride (PREVIDENT 5000 PLUS) 1.1 % CREA dental cream Place 1 application onto teeth every evening. (2100)   Yes [provider]  ticagrelor (BRILINTA) 90 MG TABS tablet Take 1 tablet (90 mg total) by mouth 2 (two) times daily. 02/04/21  Yes Lyda Jester M, [name]  nitroGLYCERIN (NITROSTAT) 0.4 MG SL tablet Place 1 tablet (0.4 mg total) under the tongue every 5 (five) minutes x 3 doses as needed for chest pain. 02/04/21   Consuelo Pandy, [name]    Physical Exam: Vitals:   06/03/22 1137 06/03/22 1300 06/03/22 1330 06/03/22 1427  BP: 90/72 99/63 (!) 99/91 101/89  Pulse: 90 78 78 82  Resp: 18 (!) 21 19 18   Temp: 97.9 F (36.6 C)   98.3 F (36.8 C)  TempSrc: Oral   Oral  SpO2: 98% 99% 100% 100%  Weight:    48.7 kg  Height:        Constitutional: NAD, calm, comfortable Eyes: PERRL, lids and conjunctivae normal ENMT: Mucous membranes are moist. Posterior pharynx clear of any exudate or lesions.Normal dentition.  Neck: normal, supple, no masses, no thyromegaly Respiratory: clear to auscultation bilaterally, no wheezing, no crackles. Normal respiratory effort. No accessory muscle use.  Cardiovascular: Regular rate and rhythm, no murmurs / rubs / gallops. No extremity edema. 2+ pedal pulses. No carotid bruits.  Abdomen: no tenderness, no masses palpated. No hepatosplenomegaly. Bowel sounds positive.  Musculoskeletal: no  clubbing / cyanosis. No joint deformity upper and lower extremities. Good ROM, no contractures. Normal muscle tone.  Skin: no rashes, lesions, ulcers. No induration Neurologic: CN 2-12 grossly intact. Sensation intact, DTR normal. Strength 5/5 in all 4.  Psychiatric: Normal judgment and insight. Alert and oriented x 3. Normal mood.    Labs on Admission: I have personally reviewed following labs and imaging studies  CBC: Recent Labs  Lab 06/03/22 1155  WBC 7.0  HGB 13.5  HCT  37.6  MCV 88.5  PLT 425   Basic Metabolic Panel: Recent Labs  Lab 06/03/22 1155  NA 121*  K 2.9*  CL 81*  CO2 27  GLUCOSE 110*  BUN 8  CREATININE 1.09*  CALCIUM 8.6*  MG 1.5*   GFR: Estimated Creatinine Clearance: 35.1 mL/min (A) (by C-G formula based on SCr of 1.09 mg/dL (H)). Liver Function Tests: No results for input(s): "AST", "ALT", "ALKPHOS", "BILITOT", "PROT", "ALBUMIN" in the last 168 hours. No results for input(s): "LIPASE", "AMYLASE" in the last 168 hours. No results for input(s): "AMMONIA" in the last 168 hours. Coagulation Profile: No results for input(s): "INR", "PROTIME" in the last 168 hours. Cardiac Enzymes: No results for input(s): "CKTOTAL", "CKMB", "CKMBINDEX", "TROPONINI" in the last 168 hours. BNP (last 3 results) No results for input(s): "PROBNP" in the last 8760 hours. HbA1C: No results for input(s): "HGBA1C" in the last 72 hours. CBG: No results for input(s): "GLUCAP" in the last 168 hours. Lipid Profile: No results for input(s): "CHOL", "HDL", "LDLCALC", "TRIG", "CHOLHDL", "LDLDIRECT" in the last 72 hours. Thyroid Function Tests: No results for input(s): "TSH", "T4TOTAL", "FREET4", "T3FREE", "THYROIDAB" in the last 72 hours. Anemia Panel: No results for input(s): "VITAMINB12", "FOLATE", "FERRITIN", "TIBC", "IRON", "RETICCTPCT" in the last 72 hours. Urine analysis:    Component Value Date/Time   COLORURINE YELLOW 02/10/2021 0316   APPEARANCEUR HAZY (A) 02/10/2021  0316   LABSPEC 1.004 (L) 02/10/2021 0316   PHURINE 6.0 02/10/2021 0316   GLUCOSEU NEGATIVE 02/10/2021 0316   HGBUR SMALL (A) 02/10/2021 0316   BILIRUBINUR NEGATIVE 02/10/2021 0316   KETONESUR NEGATIVE 02/10/2021 0316   PROTEINUR NEGATIVE 02/10/2021 0316   NITRITE NEGATIVE 02/10/2021 0316   LEUKOCYTESUR NEGATIVE 02/10/2021 0316    Radiological Exams on Admission: No results found.  EKG: Independently reviewed.  Sinus rhythm without acute ischemic changes  Assessment/Plan Principal Problem:   Hyponatremia Active Problems:   Chronic combined systolic and diastolic CHF, NYHA class 2 (HCC)   Hypokalemia   Coronary artery disease involving native coronary artery of native heart with unstable angina pectoris (HCC)   Ischemic cardiomyopathy     Hyponatremia Hypokalemia Hypomagnesemia -Suspect is related to GI losses accompanied with poor p.o. intake. -We will replace  Diarrhea -Check stool studies including C. difficile and GI pathogen panel  Chronic combined CHF Ischemic cardiomyopathy -Appears compensated -Son reports that she has not had Lasix in 2 weeks since she has been having diarrhea -Holding ACE inhibitor and beta-blocker for now  Chronic hypotension -Continue on midodrine  CAD -Currently no chest pain -EKG without ischemic changes -Continue antiplatelet agents  Generalized weakness -Likely related to electrolyte abnormalities -PT eval  DVT prophylaxis: Lovenox Code Status: Full code Family Communication: Discussed with patient's son over the phone Disposition Plan: Return to ALF on discharge Consults called:   Admission status: Observation, telemetry  Kathie Dike MD [facility]   If 7PM-7AM, please contact night-coverage [email or web address]   06/03/2022, 6:10 PM

## 2022-06-03 NOTE — Progress Notes (Signed)
Called and spoke to lab in regard to patient's collected Covid swab. Patient's swab is not showing resulted on my side. Checking into this matter at this time. Spoke and verified with ED nurse Melanie that patient was swabbed and swab given to lab tech.

## 2022-06-03 NOTE — Progress Notes (Signed)
Patient has wheelchair, cellphone, glasses from home in room.

## 2022-06-03 NOTE — ED Triage Notes (Signed)
BIB by son from [facility]. Pt had lab work drawn yesterday and son was called this morning and advised to come to ER due to low potassium, magnesium and Sodium. Pt has increased weakness and also request to have urine checked for UTI.

## 2022-06-03 NOTE — Progress Notes (Signed)
Notified patient and CNA that we needed a urine and a stool when she could. Patient currently watching Luther Parody on her TV.

## 2022-06-04 DIAGNOSIS — B9620 Unspecified Escherichia coli [E. coli] as the cause of diseases classified elsewhere: Secondary | ICD-10-CM | POA: Diagnosis present

## 2022-06-04 DIAGNOSIS — Z993 Dependence on wheelchair: Secondary | ICD-10-CM | POA: Diagnosis not present

## 2022-06-04 DIAGNOSIS — F0390 Unspecified dementia without behavioral disturbance: Secondary | ICD-10-CM | POA: Diagnosis present

## 2022-06-04 DIAGNOSIS — E876 Hypokalemia: Secondary | ICD-10-CM | POA: Diagnosis present

## 2022-06-04 DIAGNOSIS — F32A Depression, unspecified: Secondary | ICD-10-CM | POA: Diagnosis present

## 2022-06-04 DIAGNOSIS — E860 Dehydration: Secondary | ICD-10-CM | POA: Diagnosis present

## 2022-06-04 DIAGNOSIS — Z20822 Contact with and (suspected) exposure to covid-19: Secondary | ICD-10-CM | POA: Diagnosis present

## 2022-06-04 DIAGNOSIS — G9341 Metabolic encephalopathy: Secondary | ICD-10-CM | POA: Diagnosis present

## 2022-06-04 DIAGNOSIS — Z85118 Personal history of other malignant neoplasm of bronchus and lung: Secondary | ICD-10-CM | POA: Diagnosis not present

## 2022-06-04 DIAGNOSIS — I5042 Chronic combined systolic (congestive) and diastolic (congestive) heart failure: Secondary | ICD-10-CM | POA: Diagnosis present

## 2022-06-04 DIAGNOSIS — F17210 Nicotine dependence, cigarettes, uncomplicated: Secondary | ICD-10-CM | POA: Diagnosis present

## 2022-06-04 DIAGNOSIS — R197 Diarrhea, unspecified: Secondary | ICD-10-CM | POA: Diagnosis present

## 2022-06-04 DIAGNOSIS — E785 Hyperlipidemia, unspecified: Secondary | ICD-10-CM | POA: Diagnosis present

## 2022-06-04 DIAGNOSIS — F419 Anxiety disorder, unspecified: Secondary | ICD-10-CM | POA: Diagnosis present

## 2022-06-04 DIAGNOSIS — J431 Panlobular emphysema: Secondary | ICD-10-CM | POA: Diagnosis present

## 2022-06-04 DIAGNOSIS — Z923 Personal history of irradiation: Secondary | ICD-10-CM | POA: Diagnosis not present

## 2022-06-04 DIAGNOSIS — E871 Hypo-osmolality and hyponatremia: Secondary | ICD-10-CM | POA: Diagnosis present

## 2022-06-04 DIAGNOSIS — I9589 Other hypotension: Secondary | ICD-10-CM | POA: Diagnosis present

## 2022-06-04 DIAGNOSIS — I255 Ischemic cardiomyopathy: Secondary | ICD-10-CM | POA: Diagnosis present

## 2022-06-04 DIAGNOSIS — I252 Old myocardial infarction: Secondary | ICD-10-CM | POA: Diagnosis not present

## 2022-06-04 DIAGNOSIS — K219 Gastro-esophageal reflux disease without esophagitis: Secondary | ICD-10-CM | POA: Diagnosis present

## 2022-06-04 DIAGNOSIS — N390 Urinary tract infection, site not specified: Secondary | ICD-10-CM | POA: Diagnosis present

## 2022-06-04 DIAGNOSIS — G40909 Epilepsy, unspecified, not intractable, without status epilepticus: Secondary | ICD-10-CM | POA: Diagnosis present

## 2022-06-04 LAB — URINALYSIS, ROUTINE W REFLEX MICROSCOPIC
Bilirubin Urine: NEGATIVE
Glucose, UA: NEGATIVE mg/dL
Ketones, ur: NEGATIVE mg/dL
Nitrite: NEGATIVE
Protein, ur: NEGATIVE mg/dL
Specific Gravity, Urine: 1.005 (ref 1.005–1.030)
pH: 6 (ref 5.0–8.0)

## 2022-06-04 LAB — COMPREHENSIVE METABOLIC PANEL
ALT: 14 U/L (ref 0–44)
AST: 19 U/L (ref 15–41)
Albumin: 2.7 g/dL — ABNORMAL LOW (ref 3.5–5.0)
Alkaline Phosphatase: 61 U/L (ref 38–126)
Anion gap: 5 (ref 5–15)
BUN: 9 mg/dL (ref 8–23)
CO2: 25 mmol/L (ref 22–32)
Calcium: 8 mg/dL — ABNORMAL LOW (ref 8.9–10.3)
Chloride: 99 mmol/L (ref 98–111)
Creatinine, Ser: 0.81 mg/dL (ref 0.44–1.00)
GFR, Estimated: >60 mL/min (ref >60)
Glucose, Bld: 98 mg/dL (ref 70–99)
Potassium: 3.7 mmol/L (ref 3.5–5.1)
Sodium: 129 mmol/L — ABNORMAL LOW (ref 135–145)
Total Bilirubin: 0.3 mg/dL (ref 0.3–1.2)
Total Protein: 5.3 g/dL — ABNORMAL LOW (ref 6.5–8.1)

## 2022-06-04 LAB — CBC
HCT: 32.6 % — ABNORMAL LOW (ref 36.0–46.0)
Hemoglobin: 11.3 g/dL — ABNORMAL LOW (ref 12.0–15.0)
MCH: 32.4 pg (ref 26.0–34.0)
MCHC: 34.7 g/dL (ref 30.0–36.0)
MCV: 93.4 fL (ref 80.0–100.0)
Platelets: 207 10*3/uL (ref 150–400)
RBC: 3.49 MIL/uL — ABNORMAL LOW (ref 3.87–5.11)
RDW: 13.1 % (ref 11.5–15.5)
WBC: 7.2 10*3/uL (ref 4.0–10.5)
nRBC: 0 % (ref 0.0–0.2)

## 2022-06-04 LAB — HIV ANTIBODY (ROUTINE TESTING W REFLEX): HIV Screen 4th Generation wRfx: NONREACTIVE

## 2022-06-04 LAB — VITAMIN B12: Vitamin B-12: 753 pg/mL (ref 180–914)

## 2022-06-04 LAB — LIPASE, BLOOD: Lipase: 59 U/L — ABNORMAL HIGH (ref 11–51)

## 2022-06-04 LAB — TSH: TSH: 0.525 u[IU]/mL (ref 0.350–4.500)

## 2022-06-04 LAB — MAGNESIUM: Magnesium: 2.1 mg/dL (ref 1.7–2.4)

## 2022-06-04 LAB — AMMONIA: Ammonia: 25 umol/L (ref 9–35)

## 2022-06-04 MED ORDER — THIAMINE HCL 100 MG/ML IJ SOLN
500.0000 mg | Freq: Three times a day (TID) | INTRAMUSCULAR | Status: AC
  Administered 2022-06-04 – 2022-06-07 (×9): 500 mg via INTRAVENOUS
  Filled 2022-06-04 (×9): qty 5

## 2022-06-04 MED ORDER — THIAMINE HCL 100 MG/ML IJ SOLN
INTRAMUSCULAR | Status: AC
  Filled 2022-06-04: qty 6

## 2022-06-04 NOTE — Progress Notes (Signed)
PROGRESS NOTE    Colleen Fleming  MOQ:[id number] DOB: 09/19/1956 DOA: 06/03/2022 PCP: Elisabeth Cara, [name]    Brief Narrative:  66 year old female who is a resident of an assisted living facility, brought to the hospital with worsening generalized weakness and found to have abnormal labs including hyponatremia, hypokalemia and hypomagnesemia.  Family reports that she has been struggling with progressive diarrhea.   Assessment & Plan:   Principal Problem:   Hyponatremia Active Problems:   Chronic combined systolic and diastolic CHF, NYHA class 2 (HCC)   Hypokalemia   Coronary artery disease involving native coronary artery of native heart with unstable angina pectoris (HCC)   Ischemic cardiomyopathy   Hyponatremia Hypokalemia Hypomagnesemia -Suspect is related to GI losses accompanied with poor p.o. intake. -This is being replaced   Diarrhea -Stool for C. difficile negative -GI pathogen panel in process  RUQ abd pain -LFTs normal -check RUQ ultrasound  Acute delirium superimposed on baseline cognitive deficits -Her son reports that she may be developing early signs of dementia due to his concerns for some mild cognitive deficits -Currently in the hospital she is increasingly confused, agitated -Son feels that this is far from her baseline -Suspect that she may have some component of hospital delirium -UA did not show any clear signs of infection, but will check urine culture -TSH normal range -Check B12, B1, ammonia, RPR   Chronic combined CHF Ischemic cardiomyopathy -Appears compensated -Son reports that she has not had Lasix in 2 weeks since she has been having diarrhea -Holding ACE inhibitor and beta-blocker for now   Chronic hypotension -Continue on midodrine   CAD -Currently no chest pain -EKG without ischemic changes -Continue antiplatelet agents   Generalized weakness -Likely related to electrolyte abnormalities -PT eval   DVT prophylaxis:  enoxaparin (LOVENOX) injection 30 mg Start: 06/03/22 1900  Code Status: Full code Family Communication: Updated patient's son over the phone Disposition Plan: Status is: Inpatient Remains inpatient appropriate because: Continued altered mental status     Consultants:    Procedures:    Antimicrobials:      Subjective: She is confused.  Agitated at times. Reports RUQ pain that began earlier today  Objective: Vitals:   06/04/22 0820 06/04/22 0826 06/04/22 1449 06/04/22 1640  BP:   (!) 111/59 113/64  Pulse: (!) 57 (!) 52 80   Resp:      Temp:   98.4 F (36.9 C)   TempSrc:   Oral   SpO2:   100%   Weight:      Height:        Intake/Output Summary (Last 24 hours) at 06/04/2022 1939 Last data filed at 06/04/2022 1700 Gross per 24 hour  Intake 1371.85 ml  Output 350 ml  Net 1021.85 ml   Filed Weights   06/03/22 1129 06/03/22 1427  Weight: 49.9 kg 48.7 kg    Examination:  General exam: Appears calm and comfortable  Respiratory system: Clear to auscultation. Respiratory effort normal. Cardiovascular system: S1 & S2 heard, RRR. No JVD, murmurs, rubs, gallops or clicks. No pedal edema. Gastrointestinal system: Abdomen is nondistended, soft and nontender. No organomegaly or masses felt. Normal bowel sounds heard. Central nervous system: Alert and oriented. No focal neurological deficits. Extremities: Symmetric 5 x 5 power. Skin: No rashes, lesions or ulcers Psychiatry: Confused    Data Reviewed: I have personally reviewed following labs and imaging studies  CBC: Recent Labs  Lab 06/03/22 1155 06/04/22 0510  WBC 7.0 7.2  HGB 13.5 11.3*  HCT 37.6 32.6*  MCV 88.5 93.4  PLT 252 517   Basic Metabolic Panel: Recent Labs  Lab 06/03/22 1155 06/04/22 0510  NA 121* 129*  K 2.9* 3.7  CL 81* 99  CO2 27 25  GLUCOSE 110* 98  BUN 8 9  CREATININE 1.09* 0.81  CALCIUM 8.6* 8.0*  MG 1.5* 2.1   GFR: Estimated Creatinine Clearance: 47.2 mL/min (by C-G formula based  on SCr of 0.81 mg/dL). Liver Function Tests: Recent Labs  Lab 06/04/22 0510  AST 19  ALT 14  ALKPHOS 61  BILITOT 0.3  PROT 5.3*  ALBUMIN 2.7*   No results for input(s): "LIPASE", "AMYLASE" in the last 168 hours. Recent Labs  Lab 06/04/22 1838  AMMONIA 25   Coagulation Profile: No results for input(s): "INR", "PROTIME" in the last 168 hours. Cardiac Enzymes: No results for input(s): "CKTOTAL", "CKMB", "CKMBINDEX", "TROPONINI" in the last 168 hours. BNP (last 3 results) No results for input(s): "PROBNP" in the last 8760 hours. HbA1C: No results for input(s): "HGBA1C" in the last 72 hours. CBG: No results for input(s): "GLUCAP" in the last 168 hours. Lipid Profile: No results for input(s): "CHOL", "HDL", "LDLCALC", "TRIG", "CHOLHDL", "LDLDIRECT" in the last 72 hours. Thyroid Function Tests: Recent Labs    06/04/22 0510  TSH 0.525   Anemia Panel: No results for input(s): "VITAMINB12", "FOLATE", "FERRITIN", "TIBC", "IRON", "RETICCTPCT" in the last 72 hours. Sepsis Labs: No results for input(s): "PROCALCITON", "LATICACIDVEN" in the last 168 hours.  Recent Results (from the past 240 hour(s))  SARS Coronavirus 2 by RT PCR (hospital order, performed in [facility] hospital lab) *cepheid single result test* Anterior Nasal Swab     Status: None   Collection Time: 06/03/22 11:47 AM   Specimen: Anterior Nasal Swab  Result Value Ref Range Status   SARS Coronavirus 2 by RT PCR NEGATIVE NEGATIVE Final    Comment: (NOTE) SARS-CoV-2 target nucleic acids are NOT DETECTED.  The SARS-CoV-2 RNA is generally detectable in upper and lower respiratory specimens during the acute phase of infection. The lowest concentration of SARS-CoV-2 viral copies this assay can detect is 250 copies / mL. A negative result does not preclude SARS-CoV-2 infection and should not be used as the sole basis for treatment or other patient management decisions.  A negative result may occur with improper  specimen collection / handling, submission of specimen other than nasopharyngeal swab, presence of viral mutation(s) within the areas targeted by this assay, and inadequate number of viral copies (<250 copies / mL). A negative result must be combined with clinical observations, patient history, and epidemiological information.  Fact Sheet for Patients:   https://www.patel.info/  Fact Sheet for Healthcare Providers: https://hall.com/  This test is not yet approved or  cleared by the [place] FDA and has been authorized for detection and/or diagnosis of SARS-CoV-2 by FDA under an Emergency Use Authorization (EUA).  This EUA will remain in effect (meaning this test can be used) for the duration of the COVID-19 declaration under Section 564(b)(1) of the Act, 21 U.S.C. section 360bbb-3(b)(1), unless the authorization is terminated or revoked sooner.  Performed at [facility], [address], [place], [place] [address]   C Difficile Quick Screen w PCR reflex     Status: None   Collection Time: 06/03/22  6:02 PM   Specimen: STOOL  Result Value Ref Range Status   C Diff antigen NEGATIVE NEGATIVE Final   C Diff toxin NEGATIVE NEGATIVE Final   C Diff interpretation No  C. difficile detected.  Final    Comment: Performed at [facility], [address] [place], [place] [address]         Radiology Studies: No results found.      Scheduled Meds:  aspirin EC  81 mg Oral q AM   atorvastatin  80 mg Oral Daily   digoxin  62.5 mcg Oral q AM   divalproex  125 mg Oral BID   enoxaparin (LOVENOX) injection  30 mg Subcutaneous Q24H   feeding supplement  237 mL Oral BID BM   FLUoxetine  10 mg Oral Daily   midodrine  2.5 mg Oral BID WC   nystatin   Topical BID   ticagrelor  90 mg Oral BID   Continuous Infusions:  0.9 % NaCl with KCl 40 mEq / L 75 mL/hr at 06/04/22 1626     LOS: 0 days    Time spent: 61mins    Kathie Dike,  MD [facility]   If 7PM-7AM, please contact night-coverage [email or web address]  06/04/2022, 7:39 PM

## 2022-06-05 ENCOUNTER — Inpatient Hospital Stay (HOSPITAL_COMMUNITY): Payer: Medicaid Other

## 2022-06-05 DIAGNOSIS — E876 Hypokalemia: Secondary | ICD-10-CM | POA: Diagnosis not present

## 2022-06-05 DIAGNOSIS — E871 Hypo-osmolality and hyponatremia: Secondary | ICD-10-CM | POA: Diagnosis not present

## 2022-06-05 DIAGNOSIS — I5042 Chronic combined systolic (congestive) and diastolic (congestive) heart failure: Secondary | ICD-10-CM | POA: Diagnosis not present

## 2022-06-05 LAB — GASTROINTESTINAL PANEL BY PCR, STOOL (REPLACES STOOL CULTURE)

## 2022-06-05 LAB — CBC
HCT: 34.5 % — ABNORMAL LOW (ref 36.0–46.0)
Hemoglobin: 11.8 g/dL — ABNORMAL LOW (ref 12.0–15.0)
MCH: 32.4 pg (ref 26.0–34.0)
MCHC: 34.2 g/dL (ref 30.0–36.0)
MCV: 94.8 fL (ref 80.0–100.0)
Platelets: 206 10*3/uL (ref 150–400)
RBC: 3.64 MIL/uL — ABNORMAL LOW (ref 3.87–5.11)
RDW: 13.3 % (ref 11.5–15.5)
WBC: 6.9 10*3/uL (ref 4.0–10.5)
nRBC: 0 % (ref 0.0–0.2)

## 2022-06-05 LAB — COMPREHENSIVE METABOLIC PANEL
ALT: 13 U/L (ref 0–44)
AST: 17 U/L (ref 15–41)
Albumin: 2.7 g/dL — ABNORMAL LOW (ref 3.5–5.0)
Alkaline Phosphatase: 65 U/L (ref 38–126)
Anion gap: 3 — ABNORMAL LOW (ref 5–15)
BUN: 7 mg/dL — ABNORMAL LOW (ref 8–23)
CO2: 25 mmol/L (ref 22–32)
Calcium: 8 mg/dL — ABNORMAL LOW (ref 8.9–10.3)
Chloride: 105 mmol/L (ref 98–111)
Creatinine, Ser: 0.67 mg/dL (ref 0.44–1.00)
GFR, Estimated: >60 mL/min (ref >60)
Glucose, Bld: 96 mg/dL (ref 70–99)
Potassium: 3.7 mmol/L (ref 3.5–5.1)
Sodium: 133 mmol/L — ABNORMAL LOW (ref 135–145)
Total Bilirubin: 0.2 mg/dL — ABNORMAL LOW (ref 0.3–1.2)
Total Protein: 5.3 g/dL — ABNORMAL LOW (ref 6.5–8.1)

## 2022-06-05 LAB — RPR: RPR Ser Ql: NONREACTIVE

## 2022-06-05 MED ORDER — CALCIUM CARBONATE ANTACID 500 MG PO CHEW
1.0000 | CHEWABLE_TABLET | Freq: Two times a day (BID) | ORAL | Status: DC | PRN
  Administered 2022-06-05 – 2022-06-06 (×2): 200 mg via ORAL
  Filled 2022-06-05 (×3): qty 1

## 2022-06-05 MED ORDER — ALUM & MAG HYDROXIDE-SIMETH 200-200-20 MG/5ML PO SUSP
15.0000 mL | Freq: Four times a day (QID) | ORAL | Status: DC | PRN
  Administered 2022-06-05 – 2022-06-06 (×2): 15 mL via ORAL
  Filled 2022-06-05 (×2): qty 30

## 2022-06-05 NOTE — Progress Notes (Signed)
PROGRESS NOTE    Colleen Fleming  WIO:[id number] DOB: 1956/05/09 DOA: 06/03/2022 PCP: Elisabeth Cara, [name]    Brief Narrative:  66 year old female who is a resident of an assisted living facility, brought to the hospital with worsening generalized weakness and found to have abnormal labs including hyponatremia, hypokalemia and hypomagnesemia.  Family reports that she has been struggling with progressive diarrhea.  Electrolytes have improved with replacement.  Stool studies are negative for infectious etiology.  She is not having a significant amount of diarrhea in the hospital.  She is medically stable for discharge, waiting on physical therapy evaluation to determine appropriate disposition   Assessment & Plan:   Principal Problem:   Hyponatremia Active Problems:   Chronic combined systolic and diastolic CHF, NYHA class 2 (HCC)   Hypokalemia   Coronary artery disease involving native coronary artery of native heart with unstable angina pectoris (HCC)   Ischemic cardiomyopathy   Hyponatremia Hypokalemia Hypomagnesemia -Suspect is related to GI losses accompanied with poor p.o. intake. -Replaced   Diarrhea -Stool for C. difficile negative -GI pathogen panel negative -Use Imodium as needed  RUQ abd pain -LFTs normal -Right upper quadrant ultrasound unremarkable  Acute delirium superimposed on baseline cognitive deficits -Her son reports that she may be developing early signs of dementia due to his concerns for some mild cognitive deficits -Currently in the hospital she is increasingly confused, agitated -Son feels that this is far from her baseline -Suspect that she may have some component of hospital delirium -UA did not show any clear signs of infection, urine culture in process -TSH normal range -B12, ammonia, RPR unremarkable -B1 ordered, started on empiric thiamine supplementation -Overall mental status appears to be better today   Chronic combined CHF Ischemic  cardiomyopathy -Appears compensated -Son reports that she has not had Lasix in 2 weeks since she has been having diarrhea -Holding ACE inhibitor and beta-blocker for now   Chronic hypotension -Continue on midodrine   CAD -Currently no chest pain -EKG without ischemic changes -Continue antiplatelet agents   Generalized weakness -Likely related to electrolyte abnormalities -PT eval   DVT prophylaxis: enoxaparin (LOVENOX) injection 30 mg Start: 06/03/22 1900  Code Status: Full code Family Communication: Updated patient's son over the phone Disposition Plan: Status is: Inpatient Remains inpatient appropriate because: Medically stable for discharge, waiting on PT eval to determine appropriate disposition     Consultants:    Procedures:    Antimicrobials:      Subjective: Sleeping on my arrival, but wakes up to voice.  She says she is hungry.  Denies any other complaints.  Objective: Vitals:   06/04/22 1640 06/04/22 2054 06/05/22 0610 06/05/22 1328  BP: 113/64 113/66 127/88 (!) 140/78  Pulse:  70 65 75  Resp:  18 18 17   Temp:  98.3 F (36.8 C) 98.3 F (36.8 C) (!) 97.5 F (36.4 C)  TempSrc:    Oral  SpO2:  100% 100% 100%  Weight:      Height:        Intake/Output Summary (Last 24 hours) at 06/05/2022 1823 Last data filed at 06/05/2022 1801 Gross per 24 hour  Intake 1369.02 ml  Output 100 ml  Net 1269.02 ml   Filed Weights   06/03/22 1129 06/03/22 1427  Weight: 49.9 kg 48.7 kg    Examination:  General exam: Appears calm and comfortable  Respiratory system: Clear to auscultation. Respiratory effort normal. Cardiovascular system: S1 & S2 heard, RRR. No JVD, murmurs, rubs, gallops or  clicks. No pedal edema. Gastrointestinal system: Abdomen is nondistended, soft and nontender. No organomegaly or masses felt. Normal bowel sounds heard. Central nervous system: Alert and oriented. No focal neurological deficits. Extremities: Symmetric 5 x 5 power. Skin: No  rashes, lesions or ulcers Psychiatry: Appears to be less confused today.  She knows she is in the hospital.  She knows she lives at the [facility].    Data Reviewed: I have personally reviewed following labs and imaging studies  CBC: Recent Labs  Lab 06/03/22 1155 06/04/22 0510 06/05/22 0445  WBC 7.0 7.2 6.9  HGB 13.5 11.3* 11.8*  HCT 37.6 32.6* 34.5*  MCV 88.5 93.4 94.8  PLT 252 207 782   Basic Metabolic Panel: Recent Labs  Lab 06/03/22 1155 06/04/22 0510 06/05/22 0445  NA 121* 129* 133*  K 2.9* 3.7 3.7  CL 81* 99 105  CO2 27 25 25   GLUCOSE 110* 98 96  BUN 8 9 7*  CREATININE 1.09* 0.81 0.67  CALCIUM 8.6* 8.0* 8.0*  MG 1.5* 2.1  --    GFR: Estimated Creatinine Clearance: 47.8 mL/min (by C-G formula based on SCr of 0.67 mg/dL). Liver Function Tests: Recent Labs  Lab 06/04/22 0510 06/05/22 0445  AST 19 17  ALT 14 13  ALKPHOS 61 65  BILITOT 0.3 0.2*  PROT 5.3* 5.3*  ALBUMIN 2.7* 2.7*   Recent Labs  Lab 06/04/22 1839  LIPASE 59*   Recent Labs  Lab 06/04/22 1838  AMMONIA 25   Coagulation Profile: No results for input(s): "INR", "PROTIME" in the last 168 hours. Cardiac Enzymes: No results for input(s): "CKTOTAL", "CKMB", "CKMBINDEX", "TROPONINI" in the last 168 hours. BNP (last 3 results) No results for input(s): "PROBNP" in the last 8760 hours. HbA1C: No results for input(s): "HGBA1C" in the last 72 hours. CBG: No results for input(s): "GLUCAP" in the last 168 hours. Lipid Profile: No results for input(s): "CHOL", "HDL", "LDLCALC", "TRIG", "CHOLHDL", "LDLDIRECT" in the last 72 hours. Thyroid Function Tests: Recent Labs    06/04/22 0510  TSH 0.525   Anemia Panel: Recent Labs    06/04/22 1839  VITAMINB12 753   Sepsis Labs: No results for input(s): "PROCALCITON", "LATICACIDVEN" in the last 168 hours.  Recent Results (from the past 240 hour(s))  SARS Coronavirus 2 by RT PCR (hospital order, performed in [facility] hospital lab) *cepheid single  result test* Anterior Nasal Swab     Status: None   Collection Time: 06/03/22 11:47 AM   Specimen: Anterior Nasal Swab  Result Value Ref Range Status   SARS Coronavirus 2 by RT PCR NEGATIVE NEGATIVE Final    Comment: (NOTE) SARS-CoV-2 target nucleic acids are NOT DETECTED.  The SARS-CoV-2 RNA is generally detectable in upper and lower respiratory specimens during the acute phase of infection. The lowest concentration of SARS-CoV-2 viral copies this assay can detect is 250 copies / mL. A negative result does not preclude SARS-CoV-2 infection and should not be used as the sole basis for treatment or other patient management decisions.  A negative result may occur with improper specimen collection / handling, submission of specimen other than nasopharyngeal swab, presence of viral mutation(s) within the areas targeted by this assay, and inadequate number of viral copies (<250 copies / mL). A negative result must be combined with clinical observations, patient history, and epidemiological information.  Fact Sheet for Patients:   https://www.patel.info/  Fact Sheet for Healthcare Providers: https://hall.com/  This test is not yet approved or  cleared by the [place]  FDA and has been authorized for detection and/or diagnosis of SARS-CoV-2 by FDA under an Emergency Use Authorization (EUA).  This EUA will remain in effect (meaning this test can be used) for the duration of the COVID-19 declaration under Section 564(b)(1) of the Act, 21 U.S.C. section 360bbb-3(b)(1), unless the authorization is terminated or revoked sooner.  Performed at [facility], [address] [place], [place] [address]   C Difficile Quick Screen w PCR reflex     Status: None   Collection Time: 06/03/22  6:02 PM   Specimen: STOOL  Result Value Ref Range Status   C Diff antigen NEGATIVE NEGATIVE Final   C Diff toxin NEGATIVE NEGATIVE Final   C Diff interpretation No  C. difficile detected.  Final    Comment: Performed at [facility], [address], [place], [place] [address]  Gastrointestinal Panel by PCR , Stool     Status: None   Collection Time: 06/03/22  6:02 PM   Specimen: STOOL  Result Value Ref Range Status   Campylobacter species NOT DETECTED NOT DETECTED Final   Plesimonas shigelloides NOT DETECTED NOT DETECTED Final   Salmonella species NOT DETECTED NOT DETECTED Final   Yersinia enterocolitica NOT DETECTED NOT DETECTED Final   Vibrio species NOT DETECTED NOT DETECTED Final   Vibrio cholerae NOT DETECTED NOT DETECTED Final   Enteroaggregative E coli (EAEC) NOT DETECTED NOT DETECTED Final   Enteropathogenic E coli (EPEC) NOT DETECTED NOT DETECTED Final   Enterotoxigenic E coli (ETEC) NOT DETECTED NOT DETECTED Final   Shiga like toxin producing E coli (STEC) NOT DETECTED NOT DETECTED Final   Shigella/Enteroinvasive E coli (EIEC) NOT DETECTED NOT DETECTED Final   Cryptosporidium NOT DETECTED NOT DETECTED Final   Cyclospora cayetanensis NOT DETECTED NOT DETECTED Final   Entamoeba histolytica NOT DETECTED NOT DETECTED Final   Giardia lamblia NOT DETECTED NOT DETECTED Final   Adenovirus F40/41 NOT DETECTED NOT DETECTED Final   Astrovirus NOT DETECTED NOT DETECTED Final   Norovirus GI/GII NOT DETECTED NOT DETECTED Final   Rotavirus A NOT DETECTED NOT DETECTED Final   Sapovirus (I, II, IV, and V) NOT DETECTED NOT DETECTED Final    Comment: Performed at [facility], [address], [place], [place] [address]         Radiology Studies: US Abdomen Limited RUQ (LIVER/GB)  Result Date: 06/05/2022 CLINICAL DATA:  Acute right upper quadrant abdominal pain. EXAM: ULTRASOUND ABDOMEN LIMITED RIGHT UPPER QUADRANT COMPARISON:  None Available. FINDINGS: Gallbladder: No gallstones or wall thickening visualized. No sonographic Murphy sign noted by sonographer. Common bile duct: Diameter: 4 mm which is within normal limits Liver: No focal lesion  identified. Within normal limits in parenchymal echogenicity. Portal vein is patent on color Doppler imaging with normal direction of blood flow towards the liver. Other: None. IMPRESSION: No definite abnormality seen in the right upper quadrant of the abdomen. Electronically Signed   By: Marijo Conception M.D.   On: 06/05/2022 10:33   DG CHEST PORT 1 VIEW  Result Date: 06/05/2022 CLINICAL DATA:  Cough. EXAM: PORTABLE CHEST 1 VIEW COMPARISON:  11/19/2021 FINDINGS: 0424 hours. There is some mild retrocardiac left base atelectasis or infiltrate. No focal airspace disease in the right lung. Left suprahilar scarring is stable. Interstitial markings are diffusely coarsened with chronic features. Cardiopericardial silhouette is at upper limits of normal for size. Left permanent pacemaker noted. Telemetry leads overlie the chest. IMPRESSION: Retrocardiac left base atelectasis or infiltrate. Otherwise stable exam. Electronically Signed  By: Misty Stanley M.D.   On: 06/05/2022 05:27        Scheduled Meds:  aspirin EC  81 mg Oral q AM   atorvastatin  80 mg Oral Daily   digoxin  62.5 mcg Oral q AM   divalproex  125 mg Oral BID   enoxaparin (LOVENOX) injection  30 mg Subcutaneous Q24H   feeding supplement  237 mL Oral BID BM   FLUoxetine  10 mg Oral Daily   nystatin   Topical BID   ticagrelor  90 mg Oral BID   Continuous Infusions:  thiamine (VITAMIN B1) injection 500 mg (06/05/22 1354)     LOS: 1 day    Time spent: 65mins    Kathie Dike, MD [facility]   If 7PM-7AM, please contact night-coverage [email or web address]  06/05/2022, 6:23 PM

## 2022-06-05 NOTE — Progress Notes (Signed)
Spoke with Producer, television/film/video from [place] this evening, [phone], who called to request update on pt.  Advised on pt status.  Advised Amber that pt has reported right sided rib area pain a couple of times since admission but denied injury.  Amber confirms that pt fell and was found on floor Wednesday night on her right side.  Amber reports that patient has had frequent falls at facility.  Pt has order for CXR due to cough, advised provider of report from facility and area of discomfort that pt is reporting, no new orders at this time.

## 2022-06-05 NOTE — Plan of Care (Signed)
  Problem: Health Behavior/Discharge Planning: Goal: Ability to manage health-related needs will improve Outcome: Progressing   Problem: Clinical Measurements: Goal: Ability to maintain clinical measurements within normal limits will improve Outcome: Progressing   Problem: Clinical Measurements: Goal: Will remain free from infection Outcome: Progressing   

## 2022-06-06 DIAGNOSIS — E871 Hypo-osmolality and hyponatremia: Secondary | ICD-10-CM | POA: Diagnosis not present

## 2022-06-06 DIAGNOSIS — E876 Hypokalemia: Secondary | ICD-10-CM | POA: Diagnosis not present

## 2022-06-06 DIAGNOSIS — I5042 Chronic combined systolic (congestive) and diastolic (congestive) heart failure: Secondary | ICD-10-CM | POA: Diagnosis not present

## 2022-06-06 MED ORDER — POTASSIUM CHLORIDE CRYS ER 20 MEQ PO TBCR
40.0000 meq | EXTENDED_RELEASE_TABLET | Freq: Once | ORAL | Status: AC
Start: 2022-06-06 — End: 2022-06-06
  Administered 2022-06-06: 40 meq via ORAL
  Filled 2022-06-06: qty 2

## 2022-06-06 MED ORDER — FAMOTIDINE 20 MG PO TABS
20.0000 mg | ORAL_TABLET | Freq: Every day | ORAL | Status: DC
  Administered 2022-06-06 – 2022-06-09 (×4): 20 mg via ORAL
  Filled 2022-06-06 (×4): qty 1

## 2022-06-06 NOTE — Plan of Care (Signed)
  Problem: Acute Rehab PT Goals(only PT should resolve) Goal: Pt Will Go Supine/Side To Sit Outcome: Progressing Flowsheets (Taken 06/06/2022 1404) Pt will go Supine/Side to Sit: with supervision Goal: Patient Will Transfer Sit To/From Stand Outcome: Progressing Flowsheets (Taken 06/06/2022 1404) Patient will transfer sit to/from stand: with supervision Goal: Pt Will Transfer Bed To Chair/Chair To Bed Outcome: Progressing Flowsheets (Taken 06/06/2022 1404) Pt will Transfer Bed to Chair/Chair to Bed: min guard assist

## 2022-06-06 NOTE — Progress Notes (Signed)
PROGRESS NOTE    Colleen Fleming  NOM:[id number] DOB: March 16, 1956 DOA: 06/03/2022 PCP: Elisabeth Cara, [name]    Brief Narrative:  66 year old female who is a resident of an assisted living facility, brought to the hospital with worsening generalized weakness and found to have abnormal labs including hyponatremia, hypokalemia and hypomagnesemia.  Family reports that she has been struggling with progressive diarrhea.  Electrolytes have improved with replacement.  Stool studies are negative for infectious etiology.  She is not having a significant amount of diarrhea in the hospital.  She is medically stable for discharge, waiting on physical therapy evaluation to determine appropriate disposition   Assessment & Plan:   Principal Problem:   Hyponatremia Active Problems:   Chronic combined systolic and diastolic CHF, NYHA class 2 (HCC)   Hypokalemia   Coronary artery disease involving native coronary artery of native heart with unstable angina pectoris (HCC)   Ischemic cardiomyopathy   Hyponatremia Hypokalemia Hypomagnesemia -Suspect is related to GI losses accompanied with poor p.o. intake. -Replaced   Diarrhea -Stool for C. difficile negative -GI pathogen panel negative -Use Imodium as needed  RUQ abd pain -LFTs normal -Right upper quadrant ultrasound unremarkable  Acute delirium superimposed on baseline cognitive deficits -Her son reports that she may be developing early signs of dementia due to his concerns for some mild cognitive deficits -Currently in the hospital she is increasingly confused, agitated -Son feels that this is far from her baseline -Suspect that she may have some component of hospital delirium -UA did not show any clear signs of infection, urine culture in process -TSH normal range -B12, ammonia, RPR unremarkable -B1 ordered, started on empiric thiamine supplementation -Overall mental status appears to be better today   Chronic combined CHF Ischemic  cardiomyopathy -Appears compensated -Son reports that she has not had Lasix in 2 weeks since she has been having diarrhea -Holding ACE inhibitor and beta-blocker for now     CAD -Currently no chest pain -EKG without ischemic changes -Continue antiplatelet agents   Generalized weakness -Likely related to electrolyte abnormalities -Seen by physical therapy with recommendations for home health PT -Her ALF facility will have to independently assess her for home health prior to excepting her back.  This will not happen until tomorrow   DVT prophylaxis: enoxaparin (LOVENOX) injection 30 mg Start: 06/03/22 1900  Code Status: Full code Family Communication: Updated patient's son over the phone 9/3 Disposition Plan: Status is: Inpatient Remains inpatient appropriate because: Medically stable for discharge, waiting on PT eval to determine appropriate disposition     Consultants:    Procedures:    Antimicrobials:      Subjective: She does not have any complaints today.  She wants to go home.  Objective: Vitals:   06/05/22 1328 06/05/22 2052 06/06/22 0419 06/06/22 1535  BP: (!) 140/78 122/86 124/70 (!) 134/98  Pulse: 75 72 65 82  Resp: 17 18 18 18   Temp: (!) 97.5 F (36.4 C) (!) 97 F (36.1 C) (!) 97 F (36.1 C) 97.8 F (36.6 C)  TempSrc: Oral     SpO2: 100% 99% 97% 100%  Weight:      Height:        Intake/Output Summary (Last 24 hours) at 06/06/2022 1805 Last data filed at 06/06/2022 0601 Gross per 24 hour  Intake 340 ml  Output 550 ml  Net -210 ml   Filed Weights   06/03/22 1129 06/03/22 1427  Weight: 49.9 kg 48.7 kg    Examination:  General  exam: Appears calm and comfortable  Respiratory system: Clear to auscultation. Respiratory effort normal. Cardiovascular system: S1 & S2 heard, RRR. No JVD, murmurs, rubs, gallops or clicks. No pedal edema. Gastrointestinal system: Abdomen is nondistended, soft and nontender. No organomegaly or masses felt. Normal bowel  sounds heard. Central nervous system: Alert and oriented. No focal neurological deficits. Extremities: Symmetric 5 x 5 power. Skin: No rashes, lesions or ulcers Psychiatry: Pleasant   Data Reviewed: I have personally reviewed following labs and imaging studies  CBC: Recent Labs  Lab 06/03/22 1155 06/04/22 0510 06/05/22 0445  WBC 7.0 7.2 6.9  HGB 13.5 11.3* 11.8*  HCT 37.6 32.6* 34.5*  MCV 88.5 93.4 94.8  PLT 252 207 025   Basic Metabolic Panel: Recent Labs  Lab 06/03/22 1155 06/04/22 0510 06/05/22 0445  NA 121* 129* 133*  K 2.9* 3.7 3.7  CL 81* 99 105  CO2 27 25 25   GLUCOSE 110* 98 96  BUN 8 9 7*  CREATININE 1.09* 0.81 0.67  CALCIUM 8.6* 8.0* 8.0*  MG 1.5* 2.1  --    GFR: Estimated Creatinine Clearance: 47.8 mL/min (by C-G formula based on SCr of 0.67 mg/dL). Liver Function Tests: Recent Labs  Lab 06/04/22 0510 06/05/22 0445  AST 19 17  ALT 14 13  ALKPHOS 61 65  BILITOT 0.3 0.2*  PROT 5.3* 5.3*  ALBUMIN 2.7* 2.7*   Recent Labs  Lab 06/04/22 1839  LIPASE 59*   Recent Labs  Lab 06/04/22 1838  AMMONIA 25   Coagulation Profile: No results for input(s): "INR", "PROTIME" in the last 168 hours. Cardiac Enzymes: No results for input(s): "CKTOTAL", "CKMB", "CKMBINDEX", "TROPONINI" in the last 168 hours. BNP (last 3 results) No results for input(s): "PROBNP" in the last 8760 hours. HbA1C: No results for input(s): "HGBA1C" in the last 72 hours. CBG: No results for input(s): "GLUCAP" in the last 168 hours. Lipid Profile: No results for input(s): "CHOL", "HDL", "LDLCALC", "TRIG", "CHOLHDL", "LDLDIRECT" in the last 72 hours. Thyroid Function Tests: Recent Labs    06/04/22 0510  TSH 0.525   Anemia Panel: Recent Labs    06/04/22 1839  VITAMINB12 753   Sepsis Labs: No results for input(s): "PROCALCITON", "LATICACIDVEN" in the last 168 hours.  Recent Results (from the past 240 hour(s))  SARS Coronavirus 2 by RT PCR (hospital order, performed in  [facility] hospital lab) *cepheid single result test* Anterior Nasal Swab     Status: None   Collection Time: 06/03/22 11:47 AM   Specimen: Anterior Nasal Swab  Result Value Ref Range Status   SARS Coronavirus 2 by RT PCR NEGATIVE NEGATIVE Final    Comment: (NOTE) SARS-CoV-2 target nucleic acids are NOT DETECTED.  The SARS-CoV-2 RNA is generally detectable in upper and lower respiratory specimens during the acute phase of infection. The lowest concentration of SARS-CoV-2 viral copies this assay can detect is 250 copies / mL. A negative result does not preclude SARS-CoV-2 infection and should not be used as the sole basis for treatment or other patient management decisions.  A negative result may occur with improper specimen collection / handling, submission of specimen other than nasopharyngeal swab, presence of viral mutation(s) within the areas targeted by this assay, and inadequate number of viral copies (<250 copies / mL). A negative result must be combined with clinical observations, patient history, and epidemiological information.  Fact Sheet for Patients:   https://www.patel.info/  Fact Sheet for Healthcare Providers: https://hall.com/  This test is not yet approved or  cleared by the [place] and has been authorized for detection and/or diagnosis of SARS-CoV-2 by FDA under an Emergency Use Authorization (EUA).  This EUA will remain in effect (meaning this test can be used) for the duration of the COVID-19 declaration under Section 564(b)(1) of the Act, 21 U.S.C. section 360bbb-3(b)(1), unless the authorization is terminated or revoked sooner.  Performed at [facility], [address] [place], [place] [address]   C Difficile Quick Screen w PCR reflex     Status: None   Collection Time: 06/03/22  6:02 PM   Specimen: STOOL  Result Value Ref Range Status   C Diff antigen NEGATIVE NEGATIVE Final   C Diff toxin NEGATIVE  NEGATIVE Final   C Diff interpretation No C. difficile detected.  Final    Comment: Performed at [facility], [address], [place], [place] [address]  Gastrointestinal Panel by PCR , Stool     Status: None   Collection Time: 06/03/22  6:02 PM   Specimen: STOOL  Result Value Ref Range Status   Campylobacter species NOT DETECTED NOT DETECTED Final   Plesimonas shigelloides NOT DETECTED NOT DETECTED Final   Salmonella species NOT DETECTED NOT DETECTED Final   Yersinia enterocolitica NOT DETECTED NOT DETECTED Final   Vibrio species NOT DETECTED NOT DETECTED Final   Vibrio cholerae NOT DETECTED NOT DETECTED Final   Enteroaggregative E coli (EAEC) NOT DETECTED NOT DETECTED Final   Enteropathogenic E coli (EPEC) NOT DETECTED NOT DETECTED Final   Enterotoxigenic E coli (ETEC) NOT DETECTED NOT DETECTED Final   Shiga like toxin producing E coli (STEC) NOT DETECTED NOT DETECTED Final   Shigella/Enteroinvasive E coli (EIEC) NOT DETECTED NOT DETECTED Final   Cryptosporidium NOT DETECTED NOT DETECTED Final   Cyclospora cayetanensis NOT DETECTED NOT DETECTED Final   Entamoeba histolytica NOT DETECTED NOT DETECTED Final   Giardia lamblia NOT DETECTED NOT DETECTED Final   Adenovirus F40/41 NOT DETECTED NOT DETECTED Final   Astrovirus NOT DETECTED NOT DETECTED Final   Norovirus GI/GII NOT DETECTED NOT DETECTED Final   Rotavirus A NOT DETECTED NOT DETECTED Final   Sapovirus (I, II, IV, and V) NOT DETECTED NOT DETECTED Final    Comment: Performed at [facility], [address], [place], [place] [address]  Urine Culture     Status: Abnormal (Preliminary result)   Collection Time: 06/04/22  6:45 AM   Specimen: Urine, Clean Catch  Result Value Ref Range Status   Specimen Description   Final    URINE, CLEAN CATCH Performed at [facility] [address] [place], [place] [address]    Special Requests   Final    NONE Performed at [facility], [address], [place], [place] [address]     Culture (A)  Final    >=100,000 COLONIES/mL ESCHERICHIA COLI SUSCEPTIBILITIES TO FOLLOW Performed at [facility], [address], [place], [place] [address]    Report Status PENDING  Incomplete         Radiology Studies: US Abdomen Limited RUQ (LIVER/GB)  Result Date: 06/05/2022 CLINICAL DATA:  Acute right upper quadrant abdominal pain. EXAM: ULTRASOUND ABDOMEN LIMITED RIGHT UPPER QUADRANT COMPARISON:  None Available. FINDINGS: Gallbladder: No gallstones or wall thickening visualized. No sonographic Murphy sign noted by sonographer. Common bile duct: Diameter: 4 mm which is within normal limits Liver: No focal lesion identified. Within normal limits in parenchymal echogenicity. Portal vein is patent on color Doppler imaging with normal direction of blood flow towards  the liver. Other: None. IMPRESSION: No definite abnormality seen in the right upper quadrant of the abdomen. Electronically Signed   By: Marijo Conception M.D.   On: 06/05/2022 10:33   DG CHEST PORT 1 VIEW  Result Date: 06/05/2022 CLINICAL DATA:  Cough. EXAM: PORTABLE CHEST 1 VIEW COMPARISON:  11/19/2021 FINDINGS: 0424 hours. There is some mild retrocardiac left base atelectasis or infiltrate. No focal airspace disease in the right lung. Left suprahilar scarring is stable. Interstitial markings are diffusely coarsened with chronic features. Cardiopericardial silhouette is at upper limits of normal for size. Left permanent pacemaker noted. Telemetry leads overlie the chest. IMPRESSION: Retrocardiac left base atelectasis or infiltrate. Otherwise stable exam. Electronically Signed   By: Misty Stanley M.D.   On: 06/05/2022 05:27        Scheduled Meds:  aspirin EC  81 mg Oral q AM   atorvastatin  80 mg Oral Daily   digoxin  62.5 mcg Oral q AM   divalproex  125 mg Oral BID   enoxaparin (LOVENOX) injection  30 mg Subcutaneous Q24H   feeding supplement  237 mL Oral BID BM   FLUoxetine  10 mg Oral Daily   nystatin   Topical  BID   ticagrelor  90 mg Oral BID   Continuous Infusions:  thiamine (VITAMIN B1) injection 500 mg (06/06/22 1500)     LOS: 2 days    Time spent: 16mins    Kathie Dike, MD [facility]   If 7PM-7AM, please contact night-coverage [email or web address]  06/06/2022, 6:05 PM

## 2022-06-06 NOTE — Evaluation (Signed)
Physical Therapy Evaluation Patient Details Name: Colleen Fleming MRN: [id number] DOB: 07-15-1956 Today's Date: 06/06/2022  History of Present Illness  Colleen Fleming is a 66 y.o. 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  Patient's son reports that 2 to 3 weeks ago, she had labs drawn that indicated some electrolyte abnormalities including low potassium and low magnesium.  Attempt was made to supplement these orally.  She had repeat labs drawn yesterday at facility and that showed persistent hypokalemia, hyponatremia and hypomagnesemia.  Her son does note that she seems to be more weak lately and has difficulty getting around.  She has had issues with loose stools for "quite some time".  She had an episode of vomiting yesterday.  She has not had any fever or dysuria.  She has not had any shortness of breath.    Clinical Impression  Patient lying in bed on therapist arrival.  She is agreeable to therapy.  Sidelying to sit in bed with extra time and SBA. Patient able to sit on the edge of the bed with feet supported and CGA to SBA.  Patient performs sit to stand to RW with CGA/min A but once standing she is unable to shift her weight without losing her balance.  Patient states she uses a WC for primary mobility at assisted living facility. Patient returns to sitting and then lying in bed with [facility] elevated with CGA and therapist assists her with setting up her lunch tray.   Patient will benefit from continued skilled therapy services during the remainder of her hospital stay and at the next recommended venue of care to address deficits and promote return to optimal function.         Recommendations for follow up therapy are one component of a multi-disciplinary discharge planning process, led by the attending physician.  Recommendations may be updated based on  patient status, additional functional criteria and insurance authorization.  Follow Up Recommendations Home health PT      Assistance Recommended at Discharge Intermittent Supervision/Assistance  Patient can return home with the following  A little help with walking and/or transfers;A little help with bathing/dressing/bathroom;Help with stairs or ramp for entrance    Equipment Recommendations None recommended by PT  Recommendations for Other Services       Functional Status Assessment Patient has had a recent decline in their functional status and demonstrates the ability to make significant improvements in function in a reasonable and predictable amount of time.     Precautions / Restrictions Precautions Precautions: Fall Restrictions Weight Bearing Restrictions: No      Mobility  Bed Mobility Overal bed mobility: Needs Assistance Bed Mobility: Supine to Sit     Supine to sit: Min guard     General bed mobility comments: takes extra time Patient Response: Cooperative  Transfers Overall transfer level: Needs assistance Equipment used: Rolling walker (2 wheels) Transfers: Sit to/from Stand Sit to Stand: Min assist           General transfer comment: min A for sit to stand to RW; however she loses her balance with any time of weight shift or attemp to march    Ambulation/Gait   Gait Distance (Feet): 0 Feet              Stairs            Wheelchair Mobility  Modified Rankin (Stroke Patients Only)       Balance Overall balance assessment: Needs assistance Sitting-balance support: Bilateral upper extremity supported, Feet supported Sitting balance-Leahy Scale: Fair Sitting balance - Comments: fair to good sitting balance on edge of bed with feet and hands supported   Standing balance support: Bilateral upper extremity supported, During functional activity, Reliant on assistive device for balance Standing balance-Leahy Scale: Poor Standing  balance comment: poor to fair standing balance with RW and therapist min assist; patient tends to lose her balance with any weight shift or attempt to march                             Pertinent Vitals/Pain Pain Assessment Pain Assessment: No/denies pain    Home Living Family/patient expects to be discharged to:: Assisted living                 Home Equipment: Rolling Walker (2 wheels);Cane - single point;Wheelchair - manual Additional Comments: used wheelchair for primary mobility per patient report    Prior Function Prior Level of Function : Needs assist             Mobility Comments: lives in assisted living; has help with showering       Hand Dominance        Extremity/Trunk Assessment   Upper Extremity Assessment Upper Extremity Assessment: Generalized weakness    Lower Extremity Assessment Lower Extremity Assessment: Generalized weakness    Cervical / Trunk Assessment Cervical / Trunk Assessment: Normal  Communication   Communication: HOH  Cognition Arousal/Alertness: Awake/alert Behavior During Therapy: WFL for tasks assessed/performed Overall Cognitive Status: Within Functional Limits for tasks assessed                                          General Comments      Exercises     Assessment/Plan    PT Assessment Patient needs continued PT services  PT Problem List Decreased strength;Decreased activity tolerance;Decreased safety awareness;Decreased balance;Decreased mobility       PT Treatment Interventions Balance training;Neuromuscular re-education;Functional mobility training;Therapeutic activities;Therapeutic exercise;Wheelchair mobility training;Patient/family education;Gait training    PT Goals (Current goals can be found in the Care Plan section)  Acute Rehab PT Goals Patient Stated Goal: return home PT Goal Formulation: With patient Time For Goal Achievement: 06/20/22 Potential to Achieve Goals:  Good    Frequency Min 2X/week     Co-evaluation               AM-PAC PT "6 Clicks" Mobility  Outcome Measure Help needed turning from your back to your side while in a flat bed without using bedrails?: A Little Help needed moving from lying on your back to sitting on the side of a flat bed without using bedrails?: A Little Help needed moving to and from a bed to a chair (including a wheelchair)?: A Little Help needed standing up from a chair using your arms (e.g., wheelchair or bedside chair)?: A Little Help needed to walk in hospital room?: A Lot Help needed climbing 3-5 steps with a railing? : A Lot 6 Click Score: 16    End of Session   Activity Tolerance: Patient tolerated treatment well Patient left: in bed;with call bell/phone within reach;with bed alarm set   PT Visit Diagnosis: Unsteadiness on feet (R26.81);Other abnormalities of gait and  mobility (R26.89);Muscle weakness (generalized) (M62.81)    Time: 0947-0962 PT Time Calculation (min) (ACUTE ONLY): 17 min   Charges:   PT Evaluation $PT Eval Low Complexity: 1 Low PT Treatments $Therapeutic Activity: 8-22 mins        2:03 PM, 06/06/22 Celestial Barnfield Small Mattie Nordell MPT [facility] [place] [phone]

## 2022-06-06 NOTE — TOC Progression Note (Signed)
  Transition of Care [facility]) Screening Note   Patient Details  Name: Colleen Fleming Date of Birth: Jul 16, 1956   Transition of Care [facility]) CM/SW Contact:    Boneta Lucks, RN Phone Number: 06/06/2022, 10:21 AM  Medically ready, [facility] has no one to assess today to readmit. Treatment team updated.   Transition of Care Department [facility]) has reviewed patient and no TOC needs have been identified at this time. We will continue to monitor patient advancement through interdisciplinary progression rounds. If new patient transition needs arise, please place a TOC consult.     Expected Discharge Plan: Assisted Living Barriers to Discharge: Continued Medical Work up  Expected Discharge Plan and Services Expected Discharge Plan: Assisted Living       Living arrangements for the past 2 months: [place]

## 2022-06-07 DIAGNOSIS — I5042 Chronic combined systolic (congestive) and diastolic (congestive) heart failure: Secondary | ICD-10-CM | POA: Diagnosis not present

## 2022-06-07 DIAGNOSIS — E871 Hypo-osmolality and hyponatremia: Secondary | ICD-10-CM | POA: Diagnosis not present

## 2022-06-07 DIAGNOSIS — E876 Hypokalemia: Secondary | ICD-10-CM | POA: Diagnosis not present

## 2022-06-07 LAB — BASIC METABOLIC PANEL
Anion gap: 7 (ref 5–15)
BUN: 5 mg/dL — ABNORMAL LOW (ref 8–23)
CO2: 25 mmol/L (ref 22–32)
Calcium: 8.5 mg/dL — ABNORMAL LOW (ref 8.9–10.3)
Chloride: 102 mmol/L (ref 98–111)
Creatinine, Ser: 0.77 mg/dL (ref 0.44–1.00)
GFR, Estimated: >60 mL/min (ref >60)
Glucose, Bld: 82 mg/dL (ref 70–99)
Potassium: 3.3 mmol/L — ABNORMAL LOW (ref 3.5–5.1)
Sodium: 134 mmol/L — ABNORMAL LOW (ref 135–145)

## 2022-06-07 LAB — URINE CULTURE: Culture: >=100000 — AB

## 2022-06-07 MED ORDER — POTASSIUM CHLORIDE CRYS ER 20 MEQ PO TBCR
40.0000 meq | EXTENDED_RELEASE_TABLET | Freq: Once | ORAL | Status: AC
Start: 2022-06-07 — End: 2022-06-07
  Administered 2022-06-07: 40 meq via ORAL
  Filled 2022-06-07: qty 2

## 2022-06-07 MED ORDER — THIAMINE MONONITRATE 100 MG PO TABS
100.0000 mg | ORAL_TABLET | Freq: Every day | ORAL | Status: DC
  Administered 2022-06-08 – 2022-06-10 (×3): 100 mg via ORAL
  Filled 2022-06-07 (×3): qty 1

## 2022-06-07 MED ORDER — ENOXAPARIN SODIUM 40 MG/0.4ML IJ SOSY
40.0000 mg | PREFILLED_SYRINGE | INTRAMUSCULAR | Status: DC
  Administered 2022-06-07 – 2022-06-09 (×3): 40 mg via SUBCUTANEOUS
  Filled 2022-06-07 (×3): qty 0.4

## 2022-06-07 MED ORDER — LOPERAMIDE HCL 2 MG PO CAPS
2.0000 mg | ORAL_CAPSULE | Freq: Two times a day (BID) | ORAL | Status: DC
  Administered 2022-06-07 – 2022-06-10 (×6): 2 mg via ORAL
  Filled 2022-06-07 (×7): qty 1

## 2022-06-07 MED ORDER — SULFAMETHOXAZOLE-TRIMETHOPRIM 800-160 MG PO TABS: 1.0000 | ORAL_TABLET | Freq: Two times a day (BID) | ORAL | Status: DC

## 2022-06-07 MED ORDER — CEPHALEXIN 500 MG PO CAPS
500.0000 mg | ORAL_CAPSULE | Freq: Three times a day (TID) | ORAL | Status: DC
  Administered 2022-06-07 – 2022-06-10 (×9): 500 mg via ORAL
  Filled 2022-06-07 (×9): qty 1

## 2022-06-07 NOTE — Progress Notes (Signed)
Physical Therapy Treatment Patient Details Name: Colleen Fleming MRN: [id number] DOB: [birth year] Today's Date: 06/07/2022   History of Present Illness Colleen Fleming is a 66 y.o. 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  Patient's son reports that 2 to 3 weeks ago, she had labs drawn that indicated some electrolyte abnormalities including low potassium and low magnesium.  Attempt was made to supplement these orally.  She had repeat labs drawn yesterday at facility and that showed persistent hypokalemia, hyponatremia and hypomagnesemia.  Her son does note that she seems to be more weak lately and has difficulty getting around.  She has had issues with loose stools for "quite some time".  She had an episode of vomiting yesterday.  She has not had any fever or dysuria.  She has not had any shortness of breath.    PT Comments    Patient presents in chair (assisted by nursing staff) and agreeable for therapy after encouragement.  Patient demonstrates slow labored movement for completing sit to stands and transferring from chair to bedside and limited to a few unsteady side steps before having to sit due to BLE weakness and c/o fatigue.  Patient put back to bed after therapy to be cleaned by nursing staff.  Patient will benefit from continued skilled physical therapy in hospital and recommended venue below to increase strength, balance, endurance for safe ADLs and gait.    Recommendations for follow up therapy are one component of a multi-disciplinary discharge planning process, led by the attending physician.  Recommendations may be updated based on patient status, additional functional criteria and insurance authorization.  Follow Up Recommendations  Skilled nursing-short term rehab (<3 hours/day) Can patient physically be transported by private vehicle: Yes    Assistance Recommended at Discharge    Patient can return home with the following A lot of help with walking and/or transfers;A little help with bathing/dressing/bathroom;Help with stairs or ramp for entrance;Assistance with cooking/housework   Equipment Recommendations  None recommended by PT    Recommendations for Other Services       Precautions / Restrictions Precautions Precautions: Fall Restrictions Weight Bearing Restrictions: No     Mobility  Bed Mobility Overal bed mobility: Needs Assistance Bed Mobility: Supine to Sit, Sit to Supine     Supine to sit: Min guard Sit to supine: Min guard   General bed mobility comments: increased time, labored movement    Transfers Overall transfer level: Needs assistance Equipment used: Rolling walker (2 wheels) Transfers: Sit to/from Stand, Bed to chair/wheelchair/BSC Sit to Stand: Min assist, Mod assist   Step pivot transfers: Mod assist       General transfer comment: very unsteady labored movement    Ambulation/Gait Ambulation/Gait assistance: Mod assist Gait Distance (Feet): 5 Feet Assistive device: Rolling walker (2 wheels) Gait Pattern/deviations: Decreased step length - left, Decreased stance time - right, Decreased stride length Gait velocity: decreased     General Gait Details: limited to a few slow labored unsteady side steps before having to sit due to fatigue   Stairs             Wheelchair Mobility    Modified Rankin (Stroke Patients Only)       Balance Overall balance assessment: Needs assistance Sitting-balance support: Feet supported, No upper extremity supported Sitting balance-Leahy Scale: Fair Sitting balance - Comments: fair/good seated at EOB  Standing balance support: During functional activity, Bilateral upper extremity supported, Reliant on assistive device for balance Standing balance-Leahy Scale: Poor Standing balance comment: using RW                             Cognition Arousal/Alertness: Awake/alert Behavior During Therapy: WFL for tasks assessed/performed, Anxious Overall Cognitive Status: Within Functional Limits for tasks assessed                                          Exercises      General Comments        Pertinent Vitals/Pain Pain Assessment Pain Assessment: No/denies pain    Home Living                          Prior Function            PT Goals (current goals can now be found in the care plan section) Acute Rehab PT Goals Patient Stated Goal: return home PT Goal Formulation: With patient Time For Goal Achievement: 06/20/22 Potential to Achieve Goals: Good Progress towards PT goals: Progressing toward goals    Frequency    Min 3X/week      PT Plan Discharge plan needs to be updated    Co-evaluation              AM-PAC PT "6 Clicks" Mobility   Outcome Measure  Help needed turning from your back to your side while in a flat bed without using bedrails?: A Little Help needed moving from lying on your back to sitting on the side of a flat bed without using bedrails?: A Little Help needed moving to and from a bed to a chair (including a wheelchair)?: A Lot Help needed standing up from a chair using your arms (e.g., wheelchair or bedside chair)?: A Lot Help needed to walk in hospital room?: A Lot Help needed climbing 3-5 steps with a railing? : A Lot 6 Click Score: 14    End of Session   Activity Tolerance: Patient tolerated treatment well;Patient limited by fatigue Patient left: in bed;with call bell/phone within reach Nurse Communication: Mobility status PT Visit Diagnosis: Unsteadiness on feet (R26.81);Other abnormalities of gait and mobility (R26.89);Muscle weakness (generalized) (M62.81)     Time: 1120-1140 PT Time Calculation (min) (ACUTE ONLY): 20 min  Charges:  $Therapeutic Activity: 8-22 mins                     12:14 PM, 06/07/22 Lonell Grandchild,  MPT Physical Therapist with [facility] [phone] office [phone] mobile phone

## 2022-06-07 NOTE — Progress Notes (Addendum)
Pt has been incontinent of stool x 2 tonight.  Pt states she is unaware of when she has had a BM.  Pt noted to be sticking hands in stool both times tonight, one time where stool was noted to be on side rail and pt tried to remove it by licking fingers and then licking stool.  Pt stopped immediately upon noting this. Worked to give pt best nail care that pt would allow.  Pt becomes frustrated with extended cares and tells tech "I'm about to hit you."  Reinforced with pt the need to provide good pericares and perform linen/gown change.  Noted that redness in perineum is starting to increase, barrier cream thoroughly applied.  Message sent to [facility] and provider regarding pt needs while hospitalized.

## 2022-06-07 NOTE — Progress Notes (Signed)
PROGRESS NOTE    Colleen Fleming  YSA:[id number] DOB: 03-Nov-1955 DOA: 06/03/2022 PCP: Elisabeth Cara, [name]    Brief Narrative:  66 year old female who is a resident of an assisted living facility, brought to the hospital with worsening generalized weakness and found to have abnormal labs including hyponatremia, hypokalemia and hypomagnesemia.  Family reports that she has been struggling with progressive diarrhea.  Electrolytes have improved with replacement.  Stool studies are negative for infectious etiology.  She still seems to be having frequent stools and was started on Imodium.  Seen by PT with recommendations for skilled nursing facility placement   Assessment & Plan:   Principal Problem:   Hyponatremia Active Problems:   Chronic combined systolic and diastolic CHF, NYHA class 2 (HCC)   Hypokalemia   Coronary artery disease involving native coronary artery of native heart with unstable angina pectoris (HCC)   Ischemic cardiomyopathy   Hyponatremia Hypokalemia Hypomagnesemia -Suspect is related to GI losses accompanied with poor p.o. intake. -Replaced   Diarrhea -Stool for C. difficile negative -GI pathogen panel negative -Stool appears to be having frequent stools -We will start scheduled Imodium -If persists, may need GI input  RUQ abd pain -LFTs normal -Right upper quadrant ultrasound unremarkable  Acute delirium superimposed on baseline cognitive deficits -Her son reports that she may be developing early signs of dementia due to his concerns for some mild cognitive deficits -Currently in the hospital she is increasingly confused, agitated -Son feels that this is far from her baseline -Suspect that she may have some component of hospital delirium -UA did not show any clear signs of infection, urine culture in process -TSH normal range -B12, ammonia, RPR unremarkable -B1 ordered, started on empiric thiamine supplementation -Still having periods of  confusion -Urine culture positive for E. Coli  UTI -Urine culture positive for E. Coli -With concurrent mental status changes, will treat with Keflex   Chronic combined CHF Ischemic cardiomyopathy -Appears compensated -Son reports that she has not had Lasix in 2 weeks since she has been having diarrhea -Holding ACE inhibitor and beta-blocker for now     CAD -Currently no chest pain -EKG without ischemic changes -Continue antiplatelet agents   Generalized weakness -Likely related to electrolyte abnormalities -Seen by physical therapy with recommendations for skilled nursing facility placement   DVT prophylaxis: enoxaparin (LOVENOX) injection 40 mg Start: 06/07/22 1900  Code Status: Full code Family Communication: Updated patient's son over the phone 9/5 Disposition Plan: Status is: Inpatient Remains inpatient appropriate because: Needs skilled nursing facility placement and further stabilization of her bowel movements     Consultants:    Procedures:    Antimicrobials:  Keflex 9/5 >   Subjective: Sitting in chair.  Does not have any new complaints.  Noted by staff that she was having frequent stools overnight and during the day yesterday.  Objective: Vitals:   06/06/22 2030 06/07/22 0344 06/07/22 0841 06/07/22 1359  BP: (!) 145/76 (!) 157/85 137/86 138/74  Pulse: 79 79 97 87  Resp: 18 20  19   Temp: (!) 97.5 F (36.4 C) (!) 97.2 F (36.2 C)  98.8 F (37.1 C)  TempSrc:    Oral  SpO2: 97% 100% 100% 100%  Weight:      Height:        Intake/Output Summary (Last 24 hours) at 06/07/2022 1847 Last data filed at 06/07/2022 1838 Gross per 24 hour  Intake 620 ml  Output 1450 ml  Net -830 ml   [employer]  06/03/22 1129 06/03/22 1427  Weight: 49.9 kg 48.7 kg    Examination:  General exam: Appears calm and comfortable  Respiratory system: Clear to auscultation. Respiratory effort normal. Cardiovascular system: S1 & S2 heard, RRR. No JVD, murmurs, rubs,  gallops or clicks. No pedal edema. Gastrointestinal system: Abdomen is nondistended, soft and nontender. No organomegaly or masses felt. Normal bowel sounds heard. Central nervous system: Alert and oriented. No focal neurological deficits. Extremities: Symmetric 5 x 5 power. Skin: No rashes, lesions or ulcers Psychiatry: Pleasant   Data Reviewed: I have personally reviewed following labs and imaging studies  CBC: Recent Labs  Lab 06/03/22 1155 06/04/22 0510 06/05/22 0445  WBC 7.0 7.2 6.9  HGB 13.5 11.3* 11.8*  HCT 37.6 32.6* 34.5*  MCV 88.5 93.4 94.8  PLT 252 207 277   Basic Metabolic Panel: Recent Labs  Lab 06/03/22 1155 06/04/22 0510 06/05/22 0445 06/07/22 0408  NA 121* 129* 133* 134*  K 2.9* 3.7 3.7 3.3*  CL 81* 99 105 102  CO2 27 25 25 25   GLUCOSE 110* 98 96 82  BUN 8 9 7* 5*  CREATININE 1.09* 0.81 0.67 0.77  CALCIUM 8.6* 8.0* 8.0* 8.5*  MG 1.5* 2.1  --   --    GFR: Estimated Creatinine Clearance: 47.8 mL/min (by C-G formula based on SCr of 0.77 mg/dL). Liver Function Tests: Recent Labs  Lab 06/04/22 0510 06/05/22 0445  AST 19 17  ALT 14 13  ALKPHOS 61 65  BILITOT 0.3 0.2*  PROT 5.3* 5.3*  ALBUMIN 2.7* 2.7*   Recent Labs  Lab 06/04/22 1839  LIPASE 59*   Recent Labs  Lab 06/04/22 1838  AMMONIA 25   Coagulation Profile: No results for input(s): "INR", "PROTIME" in the last 168 hours. Cardiac Enzymes: No results for input(s): "CKTOTAL", "CKMB", "CKMBINDEX", "TROPONINI" in the last 168 hours. BNP (last 3 results) No results for input(s): "PROBNP" in the last 8760 hours. HbA1C: No results for input(s): "HGBA1C" in the last 72 hours. CBG: No results for input(s): "GLUCAP" in the last 168 hours. Lipid Profile: No results for input(s): "CHOL", "HDL", "LDLCALC", "TRIG", "CHOLHDL", "LDLDIRECT" in the last 72 hours. Thyroid Function Tests: No results for input(s): "TSH", "T4TOTAL", "FREET4", "T3FREE", "THYROIDAB" in the last 72 hours.  Anemia  Panel: No results for input(s): "VITAMINB12", "FOLATE", "FERRITIN", "TIBC", "IRON", "RETICCTPCT" in the last 72 hours.  Sepsis Labs: No results for input(s): "PROCALCITON", "LATICACIDVEN" in the last 168 hours.  Recent Results (from the past 240 hour(s))  SARS Coronavirus 2 by RT PCR (hospital order, performed in [facility] hospital lab) *cepheid single result test* Anterior Nasal Swab     Status: None   Collection Time: 06/03/22 11:47 AM   Specimen: Anterior Nasal Swab  Result Value Ref Range Status   SARS Coronavirus 2 by RT PCR NEGATIVE NEGATIVE Final    Comment: (NOTE) SARS-CoV-2 target nucleic acids are NOT DETECTED.  The SARS-CoV-2 RNA is generally detectable in upper and lower respiratory specimens during the acute phase of infection. The lowest concentration of SARS-CoV-2 viral copies this assay can detect is 250 copies / mL. A negative result does not preclude SARS-CoV-2 infection and should not be used as the sole basis for treatment or other patient management decisions.  A negative result may occur with improper specimen collection / handling, submission of specimen other than nasopharyngeal swab, presence of viral mutation(s) within the areas targeted by this assay, and inadequate number of viral copies (<250 copies / mL). A negative  result must be combined with clinical observations, patient history, and epidemiological information.  Fact Sheet for Patients:   https://www.patel.info/  Fact Sheet for Healthcare Providers: https://hall.com/  This test is not yet approved or  cleared by the [place] FDA and has been authorized for detection and/or diagnosis of SARS-CoV-2 by FDA under an Emergency Use Authorization (EUA).  This EUA will remain in effect (meaning this test can be used) for the duration of the COVID-19 declaration under Section 564(b)(1) of the Act, 21 U.S.C. section 360bbb-3(b)(1), unless the authorization  is terminated or revoked sooner.  Performed at [facility], [address], [place], [place] [address]   C Difficile Quick Screen w PCR reflex     Status: None   Collection Time: 06/03/22  6:02 PM   Specimen: STOOL  Result Value Ref Range Status   C Diff antigen NEGATIVE NEGATIVE Final   C Diff toxin NEGATIVE NEGATIVE Final   C Diff interpretation No C. difficile detected.  Final    Comment: Performed at [facility], [address], [place], [place] [address]  Gastrointestinal Panel by PCR , Stool     Status: None   Collection Time: 06/03/22  6:02 PM   Specimen: STOOL  Result Value Ref Range Status   Campylobacter species NOT DETECTED NOT DETECTED Final   Plesimonas shigelloides NOT DETECTED NOT DETECTED Final   Salmonella species NOT DETECTED NOT DETECTED Final   Yersinia enterocolitica NOT DETECTED NOT DETECTED Final   Vibrio species NOT DETECTED NOT DETECTED Final   Vibrio cholerae NOT DETECTED NOT DETECTED Final   Enteroaggregative E coli (EAEC) NOT DETECTED NOT DETECTED Final   Enteropathogenic E coli (EPEC) NOT DETECTED NOT DETECTED Final   Enterotoxigenic E coli (ETEC) NOT DETECTED NOT DETECTED Final   Shiga like toxin producing E coli (STEC) NOT DETECTED NOT DETECTED Final   Shigella/Enteroinvasive E coli (EIEC) NOT DETECTED NOT DETECTED Final   Cryptosporidium NOT DETECTED NOT DETECTED Final   Cyclospora cayetanensis NOT DETECTED NOT DETECTED Final   Entamoeba histolytica NOT DETECTED NOT DETECTED Final   Giardia lamblia NOT DETECTED NOT DETECTED Final   Adenovirus F40/41 NOT DETECTED NOT DETECTED Final   Astrovirus NOT DETECTED NOT DETECTED Final   Norovirus GI/GII NOT DETECTED NOT DETECTED Final   Rotavirus A NOT DETECTED NOT DETECTED Final   Sapovirus (I, II, IV, and V) NOT DETECTED NOT DETECTED Final    Comment: Performed at [facility], [address], [place], [place] [address]  Urine Culture     Status: Abnormal   Collection Time: 06/04/22  6:45 AM    Specimen: Urine, Clean Catch  Result Value Ref Range Status   Specimen Description   Final    URINE, CLEAN CATCH Performed at [facility], [address] [place], [place] [address]    Special Requests   Final    NONE Performed at [facility], [address], [place], [place] [address]    Culture >=100,000 COLONIES/mL ESCHERICHIA COLI (A)  Final   Report Status 06/07/2022 FINAL  Final   Organism ID, Bacteria ESCHERICHIA COLI (A)  Final      Susceptibility   Escherichia coli - MIC*    AMPICILLIN >=32 RESISTANT Resistant     CEFAZOLIN <=4 SENSITIVE Sensitive     CEFEPIME <=0.12 SENSITIVE Sensitive     CEFTRIAXONE <=0.25 SENSITIVE Sensitive     CIPROFLOXACIN <=0.25 SENSITIVE Sensitive     GENTAMICIN >=16 RESISTANT Resistant     IMIPENEM <=0.25 SENSITIVE  Sensitive     NITROFURANTOIN 32 SENSITIVE Sensitive     TRIMETH/SULFA >=320 RESISTANT Resistant     AMPICILLIN/SULBACTAM 16 INTERMEDIATE Intermediate     PIP/TAZO <=4 SENSITIVE Sensitive     * >=100,000 COLONIES/mL ESCHERICHIA COLI         Radiology Studies: No results found.      Scheduled Meds:  aspirin EC  81 mg Oral q AM   atorvastatin  80 mg Oral Daily   cephALEXin  500 mg Oral Q8H   digoxin  62.5 mcg Oral q AM   divalproex  125 mg Oral BID   enoxaparin (LOVENOX) injection  40 mg Subcutaneous Q24H   famotidine  20 mg Oral QHS   feeding supplement  237 mL Oral BID BM   FLUoxetine  10 mg Oral Daily   loperamide  2 mg Oral BID   nystatin   Topical BID   ticagrelor  90 mg Oral BID   Continuous Infusions:     LOS: 3 days    Time spent: 56mins    Kathie Dike, MD [facility]   If 7PM-7AM, please contact night-coverage [email or web address]  06/07/2022, 6:47 PM

## 2022-06-07 NOTE — Plan of Care (Signed)
  Problem: Education: Goal: Knowledge of General Education information will improve Description: Including pain rating scale, medication(s)/side effects and non-pharmacologic comfort measures Outcome: Progressing   Problem: Coping: Goal: Level of anxiety will decrease Outcome: Progressing Note: Pt more cooperative, less impulsive.  Better understanding of disease process.     Problem: Elimination: Goal: Will not experience complications related to bowel motility Outcome: Progressing   Problem: Pain Managment: Goal: General experience of comfort will improve Outcome: Progressing   Problem: Safety: Goal: Ability to remain free from injury will improve Outcome: Progressing   Problem: Skin Integrity: Goal: Risk for impaired skin integrity will decrease Outcome: Progressing Note: Perineum rash improving with incontinent cares and nystatin.

## 2022-06-07 NOTE — TOC Initial Note (Signed)
Transition of Care [facility]) - Initial/Assessment Note    Patient Details  Name: Colleen Fleming MRN: [id number] Date of Birth: 08-13-1956  Transition of Care [facility]) CM/SW Contact:    Boneta Lucks, RN Phone Number: 06/07/2022, 1:40 PM  Clinical Narrative:       [facility] came to assess patient this morning. They state patient is not at baseline, and weak. They suggested 2 weeks for rehab before coming back to [facility]. TOC spoke with her son, he is agreeable. He is fine to send out for bed offers in [place], [place], and [place]. [facility] sent out.             Expected Discharge Plan: Assisted Living Barriers to Discharge: Continued Medical Work up   Patient Goals and CMS Choice   CMS Medicare.gov Compare Post Acute Care list provided to:: Patient Represenative (must comment) Choice offered to / list presented to : Spouse  Expected Discharge Plan and Services Expected Discharge Plan: Assisted Living     Post Acute Care Choice: [place] Living arrangements for the past 2 months: [place]                    Prior Living Arrangements/Services Living arrangements for the past 2 months: [place] Lives with:: Facility Resident     Activities of Daily Living Home Assistive Devices/Equipment: Wheelchair ADL Screening (condition at time of admission) Patient's cognitive ability adequate to safely complete daily activities?: No Is the patient deaf or have difficulty hearing?: No Does the patient have difficulty seeing, even when wearing glasses/contacts?: Yes Does the patient have difficulty concentrating, remembering, or making decisions?: Yes Patient able to express need for assistance with ADLs?: Yes Does the patient have difficulty dressing or bathing?: Yes Independently performs ADLs?: No Communication: Independent Dressing (OT): Needs assistance Is this a change from baseline?: Pre-admission baseline Grooming: Needs  assistance Is this a change from baseline?: Pre-admission baseline Feeding: Needs assistance Is this a change from baseline?: Pre-admission baseline Bathing: Needs assistance Is this a change from baseline?: Pre-admission baseline Toileting: Needs assistance Is this a change from baseline?: Pre-admission baseline In/Out Bed: Needs assistance Is this a change from baseline?: Pre-admission baseline Walks in Home: Dependent Is this a change from baseline?: Pre-admission baseline Does the patient have difficulty walking or climbing stairs?: Yes Weakness of Legs: Both Weakness of Arms/Hands: Both  Permission Sought/Granted     Emotional Assessment       Orientation: : Oriented to Self, Oriented to Place Alcohol / Substance Use: Not Applicable Psych Involvement: No (comment)  Admission diagnosis:  Hypokalemia [E87.6] Hypomagnesemia [E83.42] Hyponatremia [E87.1] Patient Active Problem List   Diagnosis Date Noted   Hypotension (arterial) 08/16/2021   Ischemic cardiomyopathy 08/16/2021   Fall    Chronic combined systolic and diastolic CHF, NYHA class 2 ([place]) 02/13/2021   Hyponatremia 02/10/2021   Pericardial effusion 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 ([place]) 02/01/2021   Acute ST elevation myocardial infarction (STEMI) involving left anterior descending (LAD) coronary artery ([place]) 01/30/2021   Hypokalemia    Panlobular emphysema ([place])    Coronary artery disease involving native coronary artery of native heart with unstable angina pectoris ([place])    GERD (gastroesophageal reflux disease) 11/25/2011   Nicotine abuse 11/25/2011   Hyperlipidemia with target LDL less than 70 11/25/2011   IBS (irritable bowel syndrome) 11/25/2011   Anxiety as acute reaction to exceptional stress 11/25/2011   PCP:  Elisabeth Cara, [name] Pharmacy:   [facility]  [address], [place] [address] Phone: [phone] Fax: [phone]  [facility] [place], [place] [address] Phone: [phone] Fax: [phone]  [facility] [place], [place] [address] Phone: [phone] Fax: [phone]  [facility] [address] [place] [address] Phone: [phone] Fax: [place], [place] [address] [place] [place] [address] Phone: [phone] Fax: [phone]   Readmission Risk Interventions    06/07/2022    1:39 PM 02/04/2021   12:40 PM  Readmission Risk Prevention Plan  Post Dischage Appt Complete Complete  Medication Screening Complete   Transportation Screening Complete Complete

## 2022-06-07 NOTE — NC FL2 (Signed)
[place] LEVEL OF CARE SCREENING TOOL     IDENTIFICATION  Patient Name: Colleen Fleming Birthdate: Apr 07, 1956 Sex: female Admission Date (Current Location): 06/03/2022  [facility] and [place] Number:  [employer] and Address:  [place] [address], [place]      Provider Number: [id number]  Attending Physician Name and Address:  Kathie Dike, MD  Relative Name and Phone Number:  Jennett, [place] [phone])   [phone]    Current Level of Care: Hospital Recommended Level of Care: [place] Prior Approval Number:    Date Approved/Denied:   PASRR Number: [id number]  Discharge Plan: SNF    Current Diagnoses: Patient Active Problem List   Diagnosis Date Noted   Hypotension (arterial) 08/16/2021   Ischemic cardiomyopathy 08/16/2021   Fall    Chronic combined systolic and diastolic CHF, NYHA class 2 ([place]) 02/13/2021   Hyponatremia 02/10/2021   Pericardial effusion 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 [facility]) 02/01/2021   Acute ST elevation myocardial infarction (STEMI) involving left anterior descending (LAD) coronary artery ([place]) 01/30/2021   Hypokalemia    Panlobular emphysema ([place])    Coronary artery disease involving native coronary artery of native heart with unstable angina pectoris (HCC)    GERD (gastroesophageal reflux disease) 11/25/2011   Nicotine abuse 11/25/2011   Hyperlipidemia with target LDL less than 70 11/25/2011   IBS (irritable bowel syndrome) 11/25/2011   Anxiety as acute reaction to exceptional stress 11/25/2011    Orientation RESPIRATION BLADDER Height & Weight     Self, Place  Normal External catheter Weight: 48.7 kg Height:  4\' 9"  (144.8 cm)  BEHAVIORAL SYMPTOMS/MOOD NEUROLOGICAL BOWEL NUTRITION STATUS      Continent Diet (See DC summary)  AMBULATORY STATUS COMMUNICATION OF NEEDS Skin   Extensive Assist Verbally Normal                       Personal Care Assistance Level of Assistance   Bathing, Feeding, Dressing Bathing Assistance: Maximum assistance Feeding assistance: Limited assistance Dressing Assistance: Maximum assistance     Functional Limitations Info  Sight, Hearing, Speech Sight Info: Impaired Hearing Info: Impaired Speech Info: Adequate    SPECIAL CARE FACTORS FREQUENCY                       Contractures Contractures Info: Not present    Additional Factors Info  Code Status, Allergies Code Status Info: FULL Allergies Info: Penicillins           Current Medications (06/07/2022):  This is the current hospital active medication list Current Facility-Administered Medications  Medication Dose Route Frequency Provider Last Rate Last Admin   acetaminophen (TYLENOL) tablet 650 mg  650 mg Oral Q6H PRN Kathie Dike, MD   650 mg at 06/05/22 1931   Or   acetaminophen (TYLENOL) suppository 650 mg  650 mg Rectal Q6H PRN Kathie Dike, MD       alum & mag hydroxide-simeth (MAALOX/MYLANTA) 200-200-20 MG/5ML suspension 15 mL  15 mL Oral Q6H PRN Kathie Dike, MD   15 mL at 06/06/22 1924   aspirin EC tablet 81 mg  81 mg Oral q AM Kathie Dike, MD   81 mg at 06/07/22 0841   atorvastatin (LIPITOR) tablet 80 mg  80 mg Oral Daily Kathie Dike, MD   80 mg at 06/07/22 0841   calcium carbonate (TUMS - dosed in mg elemental calcium) chewable tablet 200 mg of elemental calcium  1  tablet Oral BID PRN Zierle-Ghosh, Asia B, DO   200 mg of elemental calcium at 06/06/22 1630   cephALEXin (KEFLEX) capsule 500 mg  500 mg Oral Q8H Memon, Jehanzeb, MD   500 mg at 06/07/22 1200   digoxin (LANOXIN) tablet 62.5 mcg  62.5 mcg Oral q AM Kathie Dike, MD   62.5 mcg at 06/07/22 0841   divalproex (DEPAKOTE SPRINKLE) capsule 125 mg  125 mg Oral BID Kathie Dike, MD   125 mg at 06/07/22 0841   enoxaparin (LOVENOX) injection 40 mg  40 mg Subcutaneous Q24H Kathie Dike, MD       famotidine (PEPCID) tablet 20 mg  20 mg Oral QHS Zierle-Ghosh, Asia B, DO   20 mg at  06/06/22 2113   feeding supplement (ENSURE ENLIVE / ENSURE PLUS) liquid 237 mL  237 mL Oral BID BM Kathie Dike, MD   237 mL at 06/07/22 0845   FLUoxetine (PROZAC) capsule 10 mg  10 mg Oral Daily Kathie Dike, MD   10 mg at 06/07/22 0841   loperamide (IMODIUM) capsule 2 mg  2 mg Oral BID Kathie Dike, MD   2 mg at 06/07/22 0841   nystatin (MYCOSTATIN/NYSTOP) topical powder   Topical BID Zierle-Ghosh, Asia B, DO   Given at 06/07/22 0845   ondansetron (ZOFRAN) tablet 4 mg  4 mg Oral Q6H PRN Kathie Dike, MD       Or   ondansetron (ZOFRAN) injection 4 mg  4 mg Intravenous Q6H PRN Kathie Dike, MD       ticagrelor (BRILINTA) tablet 90 mg  90 mg Oral BID Kathie Dike, MD   90 mg at 06/07/22 0962     Discharge Medications: Please see discharge summary for a list of discharge medications.  Relevant Imaging Results:  Relevant Lab Results:   Additional Information SS# [id number]  Boneta Lucks, RN

## 2022-06-08 DIAGNOSIS — E871 Hypo-osmolality and hyponatremia: Secondary | ICD-10-CM | POA: Diagnosis not present

## 2022-06-08 LAB — CBC
HCT: 36.2 % (ref 36.0–46.0)
Hemoglobin: 12 g/dL (ref 12.0–15.0)
MCH: 31.3 pg (ref 26.0–34.0)
MCHC: 33.1 g/dL (ref 30.0–36.0)
MCV: 94.3 fL (ref 80.0–100.0)
Platelets: 244 10*3/uL (ref 150–400)
RBC: 3.84 MIL/uL — ABNORMAL LOW (ref 3.87–5.11)
RDW: 13.4 % (ref 11.5–15.5)
WBC: 7.4 10*3/uL (ref 4.0–10.5)
nRBC: 0 % (ref 0.0–0.2)

## 2022-06-08 LAB — COMPREHENSIVE METABOLIC PANEL
ALT: 18 U/L (ref 0–44)
AST: 21 U/L (ref 15–41)
Albumin: 2.8 g/dL — ABNORMAL LOW (ref 3.5–5.0)
Alkaline Phosphatase: 71 U/L (ref 38–126)
Anion gap: 6 (ref 5–15)
BUN: <5 mg/dL — ABNORMAL LOW (ref 8–23)
CO2: 24 mmol/L (ref 22–32)
Calcium: 8.2 mg/dL — ABNORMAL LOW (ref 8.9–10.3)
Chloride: 106 mmol/L (ref 98–111)
Creatinine, Ser: 0.7 mg/dL (ref 0.44–1.00)
GFR, Estimated: >60 mL/min (ref >60)
Glucose, Bld: 98 mg/dL (ref 70–99)
Potassium: 3.1 mmol/L — ABNORMAL LOW (ref 3.5–5.1)
Sodium: 136 mmol/L (ref 135–145)
Total Bilirubin: 0.3 mg/dL (ref 0.3–1.2)
Total Protein: 5.6 g/dL — ABNORMAL LOW (ref 6.5–8.1)

## 2022-06-08 LAB — VITAMIN B1: Vitamin B1 (Thiamine): 102.6 nmol/L (ref 66.5–200.0)

## 2022-06-08 MED ORDER — PANCRELIPASE (LIP-PROT-AMYL) 12000-38000 UNITS PO CPEP
12000.0000 [IU] | ORAL_CAPSULE | Freq: Three times a day (TID) | ORAL | Status: DC
  Administered 2022-06-08 – 2022-06-10 (×6): 12000 [IU] via ORAL
  Filled 2022-06-08 (×6): qty 1

## 2022-06-08 MED ORDER — POTASSIUM CHLORIDE CRYS ER 20 MEQ PO TBCR
40.0000 meq | EXTENDED_RELEASE_TABLET | ORAL | Status: AC
  Administered 2022-06-08 (×2): 40 meq via ORAL
  Filled 2022-06-08 (×2): qty 2

## 2022-06-08 NOTE — Progress Notes (Signed)
PROGRESS NOTE    Colleen Fleming  HUT:[id number] DOB: Aug 23, 1956 DOA: 06/03/2022 PCP: Elisabeth Cara, [name]    Brief Narrative:  66 year old female who is a resident of an assisted living facility, brought to the hospital with worsening generalized weakness and found to have abnormal labs including hyponatremia, hypokalemia and hypomagnesemia.  Family reports that she has been struggling with progressive diarrhea.  Electrolytes have improved with replacement.  Stool studies are negative for infectious etiology.  She still seems to be having frequent stools and was started on Imodium.  Seen by PT with recommendations for skilled nursing facility placement   Assessment & Plan:   Principal Problem:   Hyponatremia Active Problems:   Chronic combined systolic and diastolic CHF, NYHA class 2 (HCC)   Hypokalemia   Coronary artery disease involving native coronary artery of native heart with unstable angina pectoris (HCC)   Ischemic cardiomyopathy   Hyponatremia Hypokalemia Hypomagnesemia -Suspect is related to GI losses accompanied with poor p.o. intake. -Continue to replace IV and orally   Diarrhea -Stool for C. difficile negative -GI pathogen panel negative -Stool appears to be having frequent stools -Subsequently started to improve with Imodium  RUQ abd pain -LFTs normal -Right upper quadrant ultrasound unremarkable  Acute delirium superimposed on baseline cognitive deficits -Her son reports that she may be developing early signs of dementia due to his concerns for some mild cognitive deficits -Currently in the hospital she is increasingly confused, agitated -Son feels that this is far from her baseline -Suspect that she may have some component of hospital delirium -UA did not show any clear signs of infection, urine culture in process -TSH normal range -B12, ammonia, RPR unremarkable -B1 ordered, started on empiric thiamine supplementation -Still having periods of  confusion -Urine culture positive for E. Coli  E. coli UTI -Urine culture from 06/04/2022 positive for E. Coli -With concurrent mental status changes,  -Continue Keflex started on 06/07/2022   Chronic combined CHF Ischemic cardiomyopathy -Appears compensated -Son reports that she has not had Lasix in 2 weeks since she has been having diarrhea -Holding ACE inhibitor and beta-blocker for now     CAD -Currently no chest pain -EKG without ischemic changes -Continue antiplatelet agents   Generalized weakness -Likely related to electrolyte abnormalities -Seen by physical therapy with recommendations for skilled nursing facility placement   DVT prophylaxis: enoxaparin (LOVENOX) injection 40 mg Start: 06/07/22 1900  Code Status: Full code Family Communication: Updated patient's son over the phone 9/5 Disposition Plan: Awaiting improvement in diarrhea... At risk for dehydration given frequency of stools Status is: Inpatient Remains inpatient appropriate because: Needs skilled nursing facility placement and further stabilization of her bowel movements     Consultants:    Procedures:    Antimicrobials:  Keflex 9/5 >   Subjective: -Stool frequency is improving with Imodium  Objective: Vitals:   06/07/22 0344 06/07/22 0841 06/07/22 1359 06/07/22 2028  BP: (!) 157/85 137/86 138/74 (!) 127/96  Pulse: 79 97 87 93  Resp: 20  19 18   Temp: (!) 97.2 F (36.2 C)  98.8 F (37.1 C)   TempSrc:   Oral   SpO2: 100% 100% 100% 100%  Weight:      Height:        Intake/Output Summary (Last 24 hours) at 06/08/2022 1915 Last data filed at 06/08/2022 1830 Gross per 24 hour  Intake 840 ml  Output 750 ml  Net 90 ml   Filed Weights   06/03/22 1129 06/03/22 1427  Weight: 49.9 kg 48.7 kg    Examination:  General exam: Appears calm and comfortable  Respiratory system: Clear to auscultation. Respiratory effort normal. Cardiovascular system: S1 & S2 heard, RRR.   Gastrointestinal  system: Abdomen is nondistended, soft and nontender.  . Normal bowel sounds heard.  No CVA area tenderness Central nervous system: Alert and oriented. No focal neurological deficits. Extremities: Symmetric 5 x 5 power. Skin: No rashes, lesions or ulcers Psychiatry: Pleasant, more coherent   Data Reviewed: I have personally reviewed following labs and imaging studies  CBC: Recent Labs  Lab 06/03/22 1155 06/04/22 0510 06/05/22 0445 06/08/22 0327  WBC 7.0 7.2 6.9 7.4  HGB 13.5 11.3* 11.8* 12.0  HCT 37.6 32.6* 34.5* 36.2  MCV 88.5 93.4 94.8 94.3  PLT 252 207 206 220   Basic Metabolic Panel: Recent Labs  Lab 06/03/22 1155 06/04/22 0510 06/05/22 0445 06/07/22 0408 06/08/22 0327  NA 121* 129* 133* 134* 136  K 2.9* 3.7 3.7 3.3* 3.1*  CL 81* 99 105 102 106  CO2 27 25 25 25 24   GLUCOSE 110* 98 96 82 98  BUN 8 9 7* 5* <5*  CREATININE 1.09* 0.81 0.67 0.77 0.70  CALCIUM 8.6* 8.0* 8.0* 8.5* 8.2*  MG 1.5* 2.1  --   --   --    GFR: Estimated Creatinine Clearance: 47.8 mL/min (by C-G formula based on SCr of 0.7 mg/dL). Liver Function Tests: Recent Labs  Lab 06/04/22 0510 06/05/22 0445 06/08/22 0327  AST 19 17 21   ALT 14 13 18   ALKPHOS 61 65 71  BILITOT 0.3 0.2* 0.3  PROT 5.3* 5.3* 5.6*  ALBUMIN 2.7* 2.7* 2.8*   Recent Labs  Lab 06/04/22 1839  LIPASE 59*   Recent Labs  Lab 06/04/22 1838  AMMONIA 25    Recent Results (from the past 240 hour(s))  SARS Coronavirus 2 by RT PCR (hospital order, performed in [facility] hospital lab) *cepheid single result test* Anterior Nasal Swab     Status: None   Collection Time: 06/03/22 11:47 AM   Specimen: Anterior Nasal Swab  Result Value Ref Range Status   SARS Coronavirus 2 by RT PCR NEGATIVE NEGATIVE Final    Comment: (NOTE) SARS-CoV-2 target nucleic acids are NOT DETECTED.  The SARS-CoV-2 RNA is generally detectable in upper and lower respiratory specimens during the acute phase of infection. The lowest concentration  of SARS-CoV-2 viral copies this assay can detect is 250 copies / mL. A negative result does not preclude SARS-CoV-2 infection and should not be used as the sole basis for treatment or other patient management decisions.  A negative result may occur with improper specimen collection / handling, submission of specimen other than nasopharyngeal swab, presence of viral mutation(s) within the areas targeted by this assay, and inadequate number of viral copies (<250 copies / mL). A negative result must be combined with clinical observations, patient history, and epidemiological information.  Fact Sheet for Patients:   https://www.patel.info/  Fact Sheet for Healthcare Providers: https://hall.com/  This test is not yet approved or  cleared by the [place] FDA and has been authorized for detection and/or diagnosis of SARS-CoV-2 by FDA under an Emergency Use Authorization (EUA).  This EUA will remain in effect (meaning this test can be used) for the duration of the COVID-19 declaration under Section 564(b)(1) of the Act, 21 U.S.C. section 360bbb-3(b)(1), unless the authorization is terminated or revoked sooner.  Performed at [facility], [address] [place], [place] [address]  C Difficile Quick Screen w PCR reflex     Status: None   Collection Time: 06/03/22  6:02 PM   Specimen: STOOL  Result Value Ref Range Status   C Diff antigen NEGATIVE NEGATIVE Final   C Diff toxin NEGATIVE NEGATIVE Final   C Diff interpretation No C. difficile detected.  Final    Comment: Performed at [facility] [place] [facility], [address] [place], [place] [address]  Gastrointestinal Panel by PCR , Stool     Status: None   Collection Time: 06/03/22  6:02 PM   Specimen: STOOL  Result Value Ref Range Status   Campylobacter species NOT DETECTED NOT DETECTED Final   Plesimonas shigelloides NOT DETECTED NOT DETECTED Final   Salmonella species NOT DETECTED NOT DETECTED Final    Yersinia enterocolitica NOT DETECTED NOT DETECTED Final   Vibrio species NOT DETECTED NOT DETECTED Final   Vibrio cholerae NOT DETECTED NOT DETECTED Final   Enteroaggregative E coli (EAEC) NOT DETECTED NOT DETECTED Final   Enteropathogenic E coli (EPEC) NOT DETECTED NOT DETECTED Final   Enterotoxigenic E coli (ETEC) NOT DETECTED NOT DETECTED Final   Shiga like toxin producing E coli (STEC) NOT DETECTED NOT DETECTED Final   Shigella/Enteroinvasive E coli (EIEC) NOT DETECTED NOT DETECTED Final   Cryptosporidium NOT DETECTED NOT DETECTED Final   Cyclospora cayetanensis NOT DETECTED NOT DETECTED Final   Entamoeba histolytica NOT DETECTED NOT DETECTED Final   Giardia lamblia NOT DETECTED NOT DETECTED Final   Adenovirus F40/41 NOT DETECTED NOT DETECTED Final   Astrovirus NOT DETECTED NOT DETECTED Final   Norovirus GI/GII NOT DETECTED NOT DETECTED Final   Rotavirus A NOT DETECTED NOT DETECTED Final   Sapovirus (I, II, IV, and V) NOT DETECTED NOT DETECTED Final    Comment: Performed at [facility], [address] [place], [place] [address]  Urine Culture     Status: Abnormal   Collection Time: 06/04/22  6:45 AM   Specimen: Urine, Clean Catch  Result Value Ref Range Status   Specimen Description   Final    URINE, CLEAN CATCH Performed at [facility], [address], [place], [place] [address]    Special Requests   Final    NONE Performed at [facility], [address], [place], [place] [address]    Culture >=100,000 COLONIES/mL ESCHERICHIA COLI (A)  Final   Report Status 06/07/2022 FINAL  Final   Organism ID, Bacteria ESCHERICHIA COLI (A)  Final      Susceptibility   Escherichia coli - MIC*    AMPICILLIN >=32 RESISTANT Resistant     CEFAZOLIN <=4 SENSITIVE Sensitive     CEFEPIME <=0.12 SENSITIVE Sensitive     CEFTRIAXONE <=0.25 SENSITIVE Sensitive     CIPROFLOXACIN <=0.25 SENSITIVE Sensitive     GENTAMICIN >=16 RESISTANT Resistant     IMIPENEM <=0.25 SENSITIVE Sensitive      NITROFURANTOIN 32 SENSITIVE Sensitive     TRIMETH/SULFA >=320 RESISTANT Resistant     AMPICILLIN/SULBACTAM 16 INTERMEDIATE Intermediate     PIP/TAZO <=4 SENSITIVE Sensitive     * >=100,000 COLONIES/mL ESCHERICHIA COLI      Radiology Studies: No results found.   Scheduled Meds:  aspirin EC  81 mg Oral q AM   atorvastatin  80 mg Oral Daily   cephALEXin  500 mg Oral Q8H   digoxin  62.5 mcg Oral q AM   divalproex  125 mg Oral BID   enoxaparin (LOVENOX) injection  40 mg Subcutaneous Q24H   famotidine  20 mg Oral QHS   feeding supplement  237 mL Oral BID BM   FLUoxetine  10 mg Oral Daily   lipase/protease/amylase  12,000 Units Oral TID AC   loperamide  2 mg Oral BID   nystatin   Topical BID   thiamine  100 mg Oral Daily   ticagrelor  90 mg Oral BID   Continuous Infusions:   LOS: 4 days    Roxan Hockey, MD [facility]   If 7PM-7AM, please contact night-coverage [email or web address]  06/08/2022, 7:15 PM

## 2022-06-09 DIAGNOSIS — E871 Hypo-osmolality and hyponatremia: Secondary | ICD-10-CM | POA: Diagnosis not present

## 2022-06-09 LAB — RENAL FUNCTION PANEL
Albumin: 2.5 g/dL — ABNORMAL LOW (ref 3.5–5.0)
Anion gap: 4 — ABNORMAL LOW (ref 5–15)
BUN: 10 mg/dL (ref 8–23)
CO2: 24 mmol/L (ref 22–32)
Calcium: 8.1 mg/dL — ABNORMAL LOW (ref 8.9–10.3)
Chloride: 106 mmol/L (ref 98–111)
Creatinine, Ser: 0.72 mg/dL (ref 0.44–1.00)
GFR, Estimated: >60 mL/min (ref >60)
Glucose, Bld: 96 mg/dL (ref 70–99)
Phosphorus: 3.8 mg/dL (ref 2.5–4.6)
Potassium: 4.6 mmol/L (ref 3.5–5.1)
Sodium: 134 mmol/L — ABNORMAL LOW (ref 135–145)

## 2022-06-09 NOTE — Progress Notes (Signed)
PROGRESS NOTE    Colleen Fleming  OEH:[id number] DOB: 1956-03-14 DOA: 06/03/2022 PCP: Elisabeth Cara, [name]    Brief Narrative:  66 year old female who is a resident of an assisted living facility, brought to the hospital with worsening generalized weakness and found to have abnormal labs including hyponatremia, hypokalemia and hypomagnesemia.  Family reports that she has been struggling with progressive diarrhea.  Electrolytes have improved with replacement.  Stool studies are negative for infectious etiology.  She still seems to be having frequent stools and was started on Imodium.  Seen by PT with recommendations for skilled nursing facility placement   Assessment & Plan:   Principal Problem:   Hyponatremia Active Problems:   Chronic combined systolic and diastolic CHF, NYHA class 2 (HCC)   Hypokalemia   Coronary artery disease involving native coronary artery of native heart with unstable angina pectoris (HCC)   Ischemic cardiomyopathy   Hyponatremia Hypokalemia Hypomagnesemia -Suspect is related to GI losses accompanied with poor p.o. intake. replaced and normalized  Diarrhea -Stool for C. difficile negative -GI pathogen panel negative -Stool appears to be having frequent stools Mostly resolved with Imodium  RUQ abd pain -LFTs normal -Right upper quadrant ultrasound unremarkable  Acute metabolic encephalopathy superimposed on baseline cognitive deficits -Her son reports that she may be developing early signs of dementia due to his concerns for some mild cognitive deficits -In the hospital initially patient had increasingly confused, agitated -TSH normal range -B12, ammonia, RPR unremarkable -B1 ordered, started on empiric thiamine supplementation - may be related to UTI -As per patient's son patient has improved collectively, and appears pretty close to baseline at this time  E. coli UTI -Urine culture from 06/04/2022 positive for E. Coli -Mentation has  improved -To complete 5 days of Keflex started on 06/07/2022   Chronic combined CHF Ischemic cardiomyopathy -Appears compensated -Son reports that she has not had Lasix in 2 weeks since she has been having diarrhea -Holding ACE inhibitor and beta-blocker for now     CAD -Currently no chest pain -EKG without ischemic changes -Continue antiplatelet agents   Generalized weakness -Likely related to electrolyte abnormalities -Evaluated by staff from [facility] discharge back on 06/10/2022 after FL2 has been completed   DVT prophylaxis: enoxaparin (LOVENOX) injection 40 mg Start: 06/07/22 1900  Code Status: Full code Family Communication: Family visited on 06/09/2022  Anticipate discharge back to ALF on 06/10/2022, otherwise SNF when bed available disposition Plan:  Status is: Inpatient Remains inpatient appropriate because: Needs skilled nursing facility placement and further stabilization of her bowel movements  Consultants:    Procedures:    Antimicrobials:  Keflex 9/5 >   Subjective: -Diarrhea and mentation has improved -Cognitive and physical abilities improving  Objective: Vitals:   06/07/22 2028 06/08/22 2010 06/09/22 0528 06/09/22 1418  BP: (!) 127/96 110/67 120/75 97/77  Pulse: 93 90 73 99  Resp: 18 17 16 17   Temp:  98 F (36.7 C) 98.4 F (36.9 C) (!) 97.5 F (36.4 C)  TempSrc:  Oral Oral   SpO2: 100% 97% 97% 98%  Weight:      Height:        Intake/Output Summary (Last 24 hours) at 06/09/2022 1853 Last data filed at 06/09/2022 1805 Gross per 24 hour  Intake 1200 ml  Output 1300 ml  Net -100 ml   Filed Weights   06/03/22 1129 06/03/22 1427  Weight: 49.9 kg 48.7 kg    Examination:  General exam: Appears calm and comfortable  Respiratory system: Clear to auscultation. Respiratory effort normal. Cardiovascular system: S1 & S2 heard, RRR.   Gastrointestinal system: Abdomen is nondistended, soft and nontender.  . Normal bowel sounds  heard.  No CVA area tenderness Central nervous system: Alert and oriented. No focal neurological deficits. Extremities: Symmetric 5 x 5 power. Skin: No rashes, lesions or ulcers Psychiatry: Pleasant, more coherent   Data Reviewed: I have personally reviewed following labs and imaging studies  CBC: Recent Labs  Lab 06/03/22 1155 06/04/22 0510 06/05/22 0445 06/08/22 0327  WBC 7.0 7.2 6.9 7.4  HGB 13.5 11.3* 11.8* 12.0  HCT 37.6 32.6* 34.5* 36.2  MCV 88.5 93.4 94.8 94.3  PLT 252 207 206 010   Basic Metabolic Panel: Recent Labs  Lab 06/03/22 1155 06/04/22 0510 06/05/22 0445 06/07/22 0408 06/08/22 0327 06/09/22 0400  NA 121* 129* 133* 134* 136 134*  K 2.9* 3.7 3.7 3.3* 3.1* 4.6  CL 81* 99 105 102 106 106  CO2 27 25 25 25 24 24   GLUCOSE 110* 98 96 82 98 96  BUN 8 9 7* 5* <5* 10  CREATININE 1.09* 0.81 0.67 0.77 0.70 0.72  CALCIUM 8.6* 8.0* 8.0* 8.5* 8.2* 8.1*  MG 1.5* 2.1  --   --   --   --   PHOS  --   --   --   --   --  3.8   GFR: Estimated Creatinine Clearance: 47.8 mL/min (by C-G formula based on SCr of 0.72 mg/dL). Liver Function Tests: Recent Labs  Lab 06/04/22 0510 06/05/22 0445 06/08/22 0327 06/09/22 0400  AST 19 17 21   --   ALT 14 13 18   --   ALKPHOS 61 65 71  --   BILITOT 0.3 0.2* 0.3  --   PROT 5.3* 5.3* 5.6*  --   ALBUMIN 2.7* 2.7* 2.8* 2.5*   Recent Labs  Lab 06/04/22 1839  LIPASE 59*   Recent Labs  Lab 06/04/22 1838  AMMONIA 25    Recent Results (from the past 240 hour(s))  SARS Coronavirus 2 by RT PCR (hospital order, performed in [facility] hospital lab) *cepheid single result test* Anterior Nasal Swab     Status: None   Collection Time: 06/03/22 11:47 AM   Specimen: Anterior Nasal Swab  Result Value Ref Range Status   SARS Coronavirus 2 by RT PCR NEGATIVE NEGATIVE Final    Comment: (NOTE) SARS-CoV-2 target nucleic acids are NOT DETECTED.  The SARS-CoV-2 RNA is generally detectable in upper and lower respiratory specimens during  the acute phase of infection. The lowest concentration of SARS-CoV-2 viral copies this assay can detect is 250 copies / mL. A negative result does not preclude SARS-CoV-2 infection and should not be used as the sole basis for treatment or other patient management decisions.  A negative result may occur with improper specimen collection / handling, submission of specimen other than nasopharyngeal swab, presence of viral mutation(s) within the areas targeted by this assay, and inadequate number of viral copies (<250 copies / mL). A negative result must be combined with clinical observations, patient history, and epidemiological information.  Fact Sheet for Patients:   https://www.patel.info/  Fact Sheet for Healthcare Providers: https://hall.com/  This test is not yet approved or  cleared by the [place] FDA and has been authorized for detection and/or diagnosis of SARS-CoV-2 by FDA under an Emergency Use Authorization (EUA).  This EUA will remain in effect (meaning this test can be used) for the duration of the COVID-19  declaration under Section 564(b)(1) of the Act, 21 U.S.C. section 360bbb-3(b)(1), unless the authorization is terminated or revoked sooner.  Performed at [facility], [address], [place], [place] [address]   C Difficile Quick Screen w PCR reflex     Status: None   Collection Time: 06/03/22  6:02 PM   Specimen: STOOL  Result Value Ref Range Status   C Diff antigen NEGATIVE NEGATIVE Final   C Diff toxin NEGATIVE NEGATIVE Final   C Diff interpretation No C. difficile detected.  Final    Comment: Performed at [facility], [address], [place], [place] [address]  Gastrointestinal Panel by PCR , Stool     Status: None   Collection Time: 06/03/22  6:02 PM   Specimen: STOOL  Result Value Ref Range Status   Campylobacter species NOT DETECTED NOT DETECTED Final   Plesimonas shigelloides NOT DETECTED NOT DETECTED Final    Salmonella species NOT DETECTED NOT DETECTED Final   Yersinia enterocolitica NOT DETECTED NOT DETECTED Final   Vibrio species NOT DETECTED NOT DETECTED Final   Vibrio cholerae NOT DETECTED NOT DETECTED Final   Enteroaggregative E coli (EAEC) NOT DETECTED NOT DETECTED Final   Enteropathogenic E coli (EPEC) NOT DETECTED NOT DETECTED Final   Enterotoxigenic E coli (ETEC) NOT DETECTED NOT DETECTED Final   Shiga like toxin producing E coli (STEC) NOT DETECTED NOT DETECTED Final   Shigella/Enteroinvasive E coli (EIEC) NOT DETECTED NOT DETECTED Final   Cryptosporidium NOT DETECTED NOT DETECTED Final   Cyclospora cayetanensis NOT DETECTED NOT DETECTED Final   Entamoeba histolytica NOT DETECTED NOT DETECTED Final   Giardia lamblia NOT DETECTED NOT DETECTED Final   Adenovirus F40/41 NOT DETECTED NOT DETECTED Final   Astrovirus NOT DETECTED NOT DETECTED Final   Norovirus GI/GII NOT DETECTED NOT DETECTED Final   Rotavirus A NOT DETECTED NOT DETECTED Final   Sapovirus (I, II, IV, and V) NOT DETECTED NOT DETECTED Final    Comment: Performed at [facility], [address], [place], [place] [address]  Urine Culture     Status: Abnormal   Collection Time: 06/04/22  6:45 AM   Specimen: Urine, Clean Catch  Result Value Ref Range Status   Specimen Description   Final    URINE, CLEAN CATCH Performed at [facility], [address], [place], [place] [address]    Special Requests   Final    NONE Performed at [facility], [address] [place], [place] [address]    Culture >=100,000 COLONIES/mL ESCHERICHIA COLI (A)  Final   Report Status 06/07/2022 FINAL  Final   Organism ID, Bacteria ESCHERICHIA COLI (A)  Final      Susceptibility   Escherichia coli - MIC*    AMPICILLIN >=32 RESISTANT Resistant     CEFAZOLIN <=4 SENSITIVE Sensitive     CEFEPIME <=0.12 SENSITIVE Sensitive     CEFTRIAXONE <=0.25 SENSITIVE Sensitive     CIPROFLOXACIN <=0.25 SENSITIVE Sensitive     GENTAMICIN >=16  RESISTANT Resistant     IMIPENEM <=0.25 SENSITIVE Sensitive     NITROFURANTOIN 32 SENSITIVE Sensitive     TRIMETH/SULFA >=320 RESISTANT Resistant     AMPICILLIN/SULBACTAM 16 INTERMEDIATE Intermediate     PIP/TAZO <=4 SENSITIVE Sensitive     * >=100,000 COLONIES/mL ESCHERICHIA COLI      Radiology Studies: No results found.   Scheduled Meds:  aspirin EC  81 mg Oral q AM   atorvastatin  80 mg Oral Daily   cephALEXin  500 mg  Oral Q8H   digoxin  62.5 mcg Oral q AM   divalproex  125 mg Oral BID   enoxaparin (LOVENOX) injection  40 mg Subcutaneous Q24H   famotidine  20 mg Oral QHS   feeding supplement  237 mL Oral BID BM   FLUoxetine  10 mg Oral Daily   lipase/protease/amylase  12,000 Units Oral TID AC   loperamide  2 mg Oral BID   nystatin   Topical BID   thiamine  100 mg Oral Daily   ticagrelor  90 mg Oral BID   Continuous Infusions:   LOS: 5 days    Roxan Hockey, MD [facility]   If 7PM-7AM, please contact night-coverage [email or web address]  06/09/2022, 6:53 PM

## 2022-06-09 NOTE — Progress Notes (Signed)
Patient evaluated by employee from [employer]. Informed this nurse that new diet order and FL2 will be needed for patient to return to facility. Dr. Denton Brick and Boneta Lucks, RN/CM made aware.

## 2022-06-09 NOTE — TOC Progression Note (Signed)
Transition of Care [facility]) - Progression Note    Patient Details  Name: Colleen Fleming MRN: [id number] Date of Birth: April 01, 1956  Transition of Care [facility]) CM/SW Contact  Boneta Lucks, RN Phone Number: 06/09/2022, 10:36 AM  Clinical Narrative:   Per MD patient has improved, back to baseline. [facility].  TOC called [facility] to come assess patient for discharge.   Expected Discharge Plan: Assisted Living Barriers to Discharge: Continued Medical Work up  Expected Discharge Plan and Services Expected Discharge Plan: Assisted Living     Post Acute Care Choice: [place] Living arrangements for the past 2 months: [place]                     Social Determinants of Health (SDOH) Interventions    Readmission Risk Interventions    06/07/2022    1:39 PM 02/04/2021   12:40 PM  Readmission Risk Prevention Plan  Post Dischage Appt Complete Complete  Medication Screening Complete   Transportation Screening Complete Complete

## 2022-06-09 NOTE — Progress Notes (Signed)
Physical Therapy Note  Patient Details  Name: Colleen Fleming MRN: [id number] Date of Birth: 1956-02-20 Today's Date: 06/09/2022    Pt refused as states she is going home today.  Teena Irani, PTA/CLT [place] Ph: [place], [place] 06/09/2022, 11:03 AM

## 2022-06-10 DIAGNOSIS — E871 Hypo-osmolality and hyponatremia: Secondary | ICD-10-CM | POA: Diagnosis not present

## 2022-06-10 MED ORDER — PANCRELIPASE (LIP-PROT-AMYL) 12000-38000 UNITS PO CPEP: 12000.0000 [IU] | ORAL_CAPSULE | Freq: Three times a day (TID) | ORAL | 2 refills | Status: DC

## 2022-06-10 MED ORDER — POTASSIUM CHLORIDE ER 10 MEQ PO TBCR
10.0000 meq | EXTENDED_RELEASE_TABLET | ORAL | 0 refills | Status: DC | PRN
Start: 2022-06-10 — End: 2024-04-18

## 2022-06-10 MED ORDER — VITAMIN B-1 100 MG PO TABS: 100.0000 mg | ORAL_TABLET | Freq: Every day | ORAL | 3 refills | Status: DC

## 2022-06-10 MED ORDER — FAMOTIDINE 20 MG PO TABS: 20.0000 mg | ORAL_TABLET | Freq: Every day | ORAL | 2 refills | Status: DC

## 2022-06-10 MED ORDER — CEPHALEXIN 500 MG PO CAPS
500.0000 mg | ORAL_CAPSULE | Freq: Three times a day (TID) | ORAL | 0 refills | Status: AC
Start: 2022-06-10 — End: 2022-06-12

## 2022-06-10 NOTE — Progress Notes (Signed)
Physical Therapy Treatment Patient Details Name: Colleen Fleming MRN: [id number] DOB: Mar 18, 1956 Today's Date: 06/10/2022   History of Present Illness Colleen Fleming is a 65 y.o. 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  Patient's son reports that 2 to 3 weeks ago, she had labs drawn that indicated some electrolyte abnormalities including low potassium and low magnesium.  Attempt was made to supplement these orally.  She had repeat labs drawn yesterday at facility and that showed persistent hypokalemia, hyponatremia and hypomagnesemia.  Her son does note that she seems to be more weak lately and has difficulty getting around.  She has had issues with loose stools for "quite some time".  She had an episode of vomiting yesterday.  She has not had any fever or dysuria.  She has not had any shortness of breath.    PT Comments    Patient demonstrates good return for sitting up at bedside with HOB flat, increased endurance/distance for gait training with slightly labored cadence and increased BLE strength for maintaining standing balance, no loss of balance and limited mostly due to c/o fatigue.  Patient tolerated sitting up in chair after therapy.  Patient will benefit from continued skilled physical therapy in hospital and recommended venue below to increase strength, balance, endurance for safe ADLs and gait.     Recommendations for follow up therapy are one component of a multi-disciplinary discharge planning process, led by the attending physician.  Recommendations may be updated based on patient status, additional functional criteria and insurance authorization.  Follow Up Recommendations  Home health PT Can patient physically be transported by private vehicle: Yes   Assistance Recommended at Discharge Set up Supervision/Assistance  Patient can return  home with the following A little help with walking and/or transfers;A little help with bathing/dressing/bathroom;Help with stairs or ramp for entrance;Assistance with cooking/housework   Equipment Recommendations  None recommended by PT    Recommendations for Other Services       Precautions / Restrictions Precautions Precautions: Fall Restrictions Weight Bearing Restrictions: No     Mobility  Bed Mobility Overal bed mobility: Modified Independent             General bed mobility comments: good return for sitting up at bedside with HOB flat    Transfers Overall transfer level: Needs assistance Equipment used: Rolling walker (2 wheels) Transfers: Sit to/from Stand, Bed to chair/wheelchair/BSC Sit to Stand: Supervision   Step pivot transfers: Supervision, Min guard       General transfer comment: increased BLE stregnth for completing sit to stands    Ambulation/Gait Ambulation/Gait assistance: Supervision, Min guard Gait Distance (Feet): 55 Feet Assistive device: Rolling walker (2 wheels) Gait Pattern/deviations: Decreased step length - left, Decreased stance time - right, Decreased stride length Gait velocity: decreased     General Gait Details: demonstrates increased endurance/distance for ambulation without loss of balance, limited mostly due to fatigue   Stairs             Wheelchair Mobility    Modified Rankin (Stroke Patients Only)       Balance Overall balance assessment: Needs assistance Sitting-balance support: Feet supported, No upper extremity supported Sitting balance-Leahy Scale: Good Sitting balance - Comments: seated at EOB   Standing balance support: During functional activity, Bilateral upper extremity supported Standing balance-Leahy Scale: Fair Standing balance comment: fair/good using RW  Cognition Arousal/Alertness: Awake/alert Behavior During Therapy: WFL for tasks  assessed/performed, Anxious Overall Cognitive Status: Within Functional Limits for tasks assessed                                          Exercises      General Comments        Pertinent Vitals/Pain Pain Assessment Pain Assessment: No/denies pain    Home Living                          Prior Function            PT Goals (current goals can now be found in the care plan section) Acute Rehab PT Goals Patient Stated Goal: return home PT Goal Formulation: With patient Time For Goal Achievement: 06/20/22 Potential to Achieve Goals: Good Progress towards PT goals: Progressing toward goals    Frequency    Min 3X/week      PT Plan Discharge plan needs to be updated    Co-evaluation              AM-PAC PT "6 Clicks" Mobility   Outcome Measure  Help needed turning from your back to your side while in a flat bed without using bedrails?: None Help needed moving from lying on your back to sitting on the side of a flat bed without using bedrails?: None Help needed moving to and from a bed to a chair (including a wheelchair)?: A Little Help needed standing up from a chair using your arms (e.g., wheelchair or bedside chair)?: A Little Help needed to walk in hospital room?: A Little Help needed climbing 3-5 steps with a railing? : A Little 6 Click Score: 20    End of Session   Activity Tolerance: Patient tolerated treatment well;Patient limited by fatigue Patient left: in chair;with call bell/phone within reach;with chair alarm set Nurse Communication: Mobility status PT Visit Diagnosis: Unsteadiness on feet (R26.81);Other abnormalities of gait and mobility (R26.89);Muscle weakness (generalized) (M62.81)     Time: 1610-9604 PT Time Calculation (min) (ACUTE ONLY): 20 min  Charges:  $Gait Training: 8-22 mins $Therapeutic Activity: 8-22 mins                     10:28 AM, 06/10/22 Lonell Grandchild, MPT Physical Therapist with  [facility] 336 [phone] office [phone] mobile phone

## 2022-06-10 NOTE — Discharge Instructions (Signed)
1)Avoid ibuprofen/Advil/Aleve/Motrin/Goody Powders/Naproxen/BC powders/Meloxicam/Diclofenac/Indomethacin and other Nonsteroidal anti-inflammatory medications as these will make you more likely to bleed and can cause stomach ulcers, can also cause Kidney problems.   2)Repeat CBC and BMP within 1 week

## 2022-06-10 NOTE — Discharge Summary (Addendum)
ZAYLI VILLAFUERTE, is a 66 y.o. female  DOB 1956/09/18  MRN [id number].  Admission date:  06/03/2022  Admitting Physician  Kathie Dike, MD  Discharge Date:  06/10/2022   Primary MD  Belva Bertin, [place], [name]  Recommendations for primary care physician for things to follow:   1)Avoid ibuprofen/Advil/Aleve/Motrin/Goody Powders/Naproxen/BC powders/Meloxicam/Diclofenac/Indomethacin and other Nonsteroidal anti-inflammatory medications as these will make you more likely to bleed and can cause stomach ulcers, can also cause Kidney problems.   2)Repeat CBC and BMP within 1 week  Admission Diagnosis  Hypokalemia [E87.6] Hypomagnesemia [E83.42] Hyponatremia [E87.1]   Discharge Diagnosis  Hypokalemia [E87.6] Hypomagnesemia [E83.42] Hyponatremia [E87.1]    Principal Problem:   Hyponatremia Active Problems:   Chronic combined systolic and diastolic CHF, NYHA class 2 (HCC)   Hypokalemia   Coronary artery disease involving native coronary artery of native heart with unstable angina pectoris ([place])   Ischemic cardiomyopathy      Past Medical History:  Diagnosis Date   Acute ST elevation myocardial infarction (STEMI) involving left anterior descending (LAD) coronary artery ([place]) 01/30/2021   99% thrombotic subtotal occlusion of Prox LAD => (DES PCI LAD).  apical LAD occlusion with distal embolization.  EF estimated 40-45% w/ mid-apical anterior HK; ~ normal LVEDP with SBP 98 mmHg. => Echo EF 20 to 25% with entire anterior wall hypokinesis and apical akinesis.   Anxiety and depression    Chronic combined systolic and diastolic CHF, NYHA class 2 ([place]) 02/13/2021   EF 25%.  GR 2 DD.   COPD (chronic obstructive pulmonary disease) (HCC)    Long-term smoker greater than 35 years 1 to 2 pack a day.   Coronary artery disease involving native coronary artery of native heart with unstable angina pectoris ([place])    Presented  with anterior STEMI-99% subtotal occluded proximal LAD-DES PCI (Synergy DES 2.5 mm 60 mm - 2.8 mm) distally apical thromboembolism.  Reduced EF with anterior and apical hypokinesis.   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  On midodrine for blood pressure support.  Previously not able to tolerate beta-blocker or other CHF medications.   Ischemic cardiomyopathy 08/16/2021   EF 20 to 25% following anterior STEMI, despite LAD PCI.Marland Kitchen  Unable to titrate medications due to hypotension.   Obesity    Panlobular emphysema (HCC)    Small cell lung cancer, left upper lobe ([place]) 2014   Treated with radiation and chemotherapy [facility])    Past Surgical History:  Procedure Laterality Date   CORONARY/GRAFT ACUTE MI REVASCULARIZATION N/A 01/30/2021   Procedure: Coronary/Graft Acute MI Revascularization;  Surgeon: Leonie Man, MD;  Location: [place] [facility];  Service: Cardiovascular; Prox LAD 99% (-> 0%) DES PCI (Synergy DES 2.5 mm x 16 mm -  2.67mm) -> distal embolization with apical 80% occlusion   ICD IMPLANT N/A 11/19/2021   Procedure: ICD IMPLANT;  Surgeon: Evans Lance, MD;  Location: [place] [facility];  Service: Cardiovascular;  Laterality: N/A;   LEFT HEART CATH AND CORONARY ANGIOGRAPHY N/A 01/30/2021   Procedure: LEFT HEART CATH AND CORONARY ANGIOGRAPHY;  Surgeon: Leonie Man, MD;  Location: [place] [facility];  Service: Cardiovascular; ANT STEMI: 99% thrombotic subtotal occlusion of Prox LAD => (DES PCI LAD). Otherwise angiographically normal coronaries with exception of apical LAD occlusion with distal embolization.  EF estimated 45% w/ mid-apical anterior HK; ~ nl LVEDP w/  SBP 98 mmHg.   RIGHT  HEART CATH N/A 02/15/2021   Procedure: RIGHT HEART CATH;  Surgeon: Jolaine Artist, MD;  Location: [place] [facility];  Service: CardiovRA = 2 mmHg; RV = 17/2 mmHg, PA = 17/4 (8) mmHg; PCW = 4 mmHg;   Ao sat = 98%, PA sat = 64%, 65%Fick Cardiac Output/Index = 3.0/2.4; Thermo CO/CI = 2.8/2.2; PVR = 1.3 [employer];   TRANSTHORACIC ECHOCARDIOGRAM  01/31/2021   (post Ant STEMI -PCI): EF 25-30%. Severe HK of entire Anterior/Anteroseptal wall & mild dyskinesis of anteroapical/apical segment. Gr 2 DD.  Normal RV size and function with mildly elevated.  RAP estimated 8 mm.  Small circumferential pericardial effusion.  Normal aortic and mitral valves.   TRANSTHORACIC ECHOCARDIOGRAM  02/01/2021   a) Limited: small-mod pericardial effusion primarily anterior to RV. IVC collapses ~50%. Nl RV & RAP. NO OVERT TAMPONADE; b) 5/14: Large, predominantly anterior pericardial effusion -> findings suggestive of early tamponade physiology, but no RV diastolic collapse and IVC not dilated.  RAP 3 mmHg. = EFFUSION LARGER, no TAMPONADE     HPI  from the history and physical done on the day of admission:     I have personally briefly reviewed patient's old medical records in [place]   Chief Complaint: Abnormal labs, generalized weakness   HPI: Colleen Fleming is a 66 y.o. 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  Patient's son reports that 2 to 3 weeks ago, she had labs drawn that indicated some electrolyte abnormalities including low potassium and low magnesium.  Attempt was made to supplement these orally.  She had repeat labs drawn yesterday at facility and that showed persistent hypokalemia, hyponatremia and hypomagnesemia.  Her son does note that she seems to be more weak lately and has difficulty getting around.  She has had issues with loose stools for "quite  some time".  She had an episode of vomiting yesterday.  She has not had any fever or dysuria.  She has not had any shortness of breath.   ED Course: She was noted to be significantly hypokalemic, hypomagnesemic and hyponatremic in the emergency room.  Blood pressure is low normal.  She is not tachycardic.   Review of Systems: As per HPI otherwise 10 point review of systems negative.    Hospital Course:    Assessment and Plan:  1) dehydration and electrolyte derangement including hyponatremia, Hypokalemia and Hypomagnesemia -Due to GI losses accompanied with poor p.o. intake. -Electrolytes normalized with IV fluids, resolution of diarrhea and improvement in oral intake  2)Diarrhea -Stool for C. difficile negative -GI pathogen panel negative -Resolved with Imodium, okay to use Imodium as needed   3)RUQ abd pain -LFTs normal -Right upper quadrant  ultrasound unremarkable -No further abdominal pain   4)Acute metabolic encephalopathy superimposed on baseline cognitive deficits -Her son reports that she may be developing early signs of dementia due to his concerns for some mild cognitive deficits -In the hospital initially patient had increasingly confused, agitated -TSH normal range -B12, ammonia, RPR unremarkable -B1 ordered, started on empiric thiamine supplementation - may be related to UTI as well as dehydration and electrolyte derangement -As per patient's son patient has improved cognitively, and appears pretty close to baseline at this time   5)E. coli UTI -Urine culture from 06/04/2022 positive for E. Coli -Mentation has improved -ok to  complete 5 days of Keflex started on 06/07/2022   6)Chronic combined CHF Ischemic cardiomyopathy -Appears compensated -Son reports that she has not had Lasix in 2 weeks since she has been having diarrhea -Okay to resume PTA meds May use Lasix as needed    7)CAD -Currently no chest pain -EKG without ischemic changes -Continue antiplatelet  agents   8)Generalized weakness -Likely related to electrolyte abnormalities -Evaluated by staff from [facility] -Generalized weakness has improved okay to return back to ALF with home health physical therapy following at facility   Code Status: Full code Family Communication: Family visited on 06/09/2022   -Discharge back to ALF  Discharge Condition: stable  Follow UP   Contact information for follow-up providers     [place], [place], [place]. Schedule an appointment as soon as possible for a visit in 1 week(s).   Specialty: Family Medicine Why: Repeat CBC and BMP Blood tests Contact information: [address] [place] [address] [phone]         Leonie Man, MD .   Specialty: Cardiology Contact information: [address] [place] [address] [phone]              Contact information for after-discharge care     [facility] .   Service: Skilled Nursing Contact information: [address] [place] [phone]                    Diet and Activity recommendation:  As advised  Discharge Instructions    Discharge Instructions     Call MD for:  difficulty breathing, headache or visual disturbances   Complete by: As directed    Call MD for:  persistant dizziness or light-headedness   Complete by: As directed    Call MD for:  persistant nausea and vomiting   Complete by: As directed    Call MD for:  temperature >100.4   Complete by: As directed    Diet - low sodium heart healthy   Complete by: As directed    Discharge instructions   Complete by: As directed    1)Avoid ibuprofen/Advil/Aleve/Motrin/Goody Powders/Naproxen/BC powders/Meloxicam/Diclofenac/Indomethacin and other Nonsteroidal anti-inflammatory medications as these will make you more likely to bleed and can cause stomach ulcers, can also cause Kidney problems.    2)Repeat CBC and BMP within 1 week   Increase activity slowly   Complete by: As directed          Discharge Medications     Allergies as of 06/10/2022       Reactions   Penicillins Hives        Medication List     STOP taking these medications    alum & mag hydroxide-simeth 200-200-20 MG/5ML suspension Commonly known as: MAALOX/MYLANTA  ibuprofen 800 MG tablet Commonly known as: ADVIL   potassium chloride SA 20 MEQ tablet Commonly known as: KLOR-CON M       TAKE these medications    acetaminophen 500 MG tablet Commonly known as: TYLENOL Take 500 mg by mouth every 6 (six) hours as needed for mild pain.   aspirin EC 81 MG tablet Take 81 mg by mouth in the morning. (0900) Swallow whole.   atorvastatin 80 MG tablet Commonly known as: LIPITOR Take 1 tablet (80 mg total) by mouth daily.   Brilinta 90 MG Tabs tablet Generic drug: ticagrelor Take 1 tablet (90 mg total) by mouth 2 (two) times daily.   calcium carbonate 750 MG chewable tablet Commonly known as: TUMS EX Chew 2 tablets by mouth daily as needed for heartburn.   cephALEXin 500 MG capsule Commonly known as: KEFLEX Take 1 capsule (500 mg total) by mouth 3 (three) times daily for 2 days.   Depakote Sprinkles 125 MG capsule Generic drug: divalproex Take 125 mg by mouth 2 (two) times daily.   Digoxin 62.5 MCG Tabs Take 62.5 mcg by mouth in the morning. (0900)   famotidine 20 MG tablet Commonly known as: PEPCID Take 1 tablet (20 mg total) by mouth at bedtime.   furosemide 40 MG tablet Commonly known as: Lasix Take 1 tablet (40 mg total) by mouth daily as needed for fluid or edema (for weight gain more than 3 lbs in a day , leg edema).   lipase/protease/amylase 12000-38000 units Cpep capsule Commonly known as: CREON Take 1 capsule (12,000 Units total) by mouth 3 (three) times daily before meals.   lisinopril 10 MG tablet Commonly known as: ZESTRIL Take 10 mg by mouth in the morning and  at bedtime.   loperamide 2 MG capsule Commonly known as: IMODIUM Take 2 mg by mouth as needed for diarrhea or loose stools.   metoprolol succinate 25 MG 24 hr tablet Commonly known as: Toprol XL Take 0.5 tablets (12.5 mg total) by mouth daily.   midodrine 2.5 MG tablet Commonly known as: PROAMATINE Only give if Systolic blood pressure is less than 105 mmhg What changed:  how much to take how to take this when to take this additional instructions   nitroGLYCERIN 0.4 MG SL tablet Commonly known as: NITROSTAT Place 1 tablet (0.4 mg total) under the tongue every 5 (five) minutes x 3 doses as needed for chest pain.   OLANZapine 2.5 MG tablet Commonly known as: ZYPREXA Take 2.5 mg by mouth daily as needed (agitation/paranoia).   potassium chloride 10 MEQ tablet Commonly known as: KLOR-CON Take 1 tablet (10 mEq total) by mouth as needed. Take While taking Lasix/furosemide   PROzac 10 MG capsule Generic drug: FLUoxetine Take 10 mg by mouth daily.   sodium fluoride 1.1 % Crea dental cream Commonly known as: PREVIDENT 5000 PLUS Place 1 application onto teeth every evening. (2100)   thiamine 100 MG tablet Commonly known as: Vitamin B-1 Take 1 tablet (100 mg total) by mouth daily. Start taking on: June 11, 2022   Vitamin D3 25 MCG (1000 UT) Caps Take 1 capsule by mouth daily.        Major procedures and Radiology Reports - PLEASE review detailed and final reports for all details, in brief -   US Abdomen Limited RUQ (LIVER/GB)  Result Date: 06/05/2022 CLINICAL DATA:  Acute right upper quadrant abdominal pain. EXAM: ULTRASOUND ABDOMEN LIMITED RIGHT UPPER QUADRANT COMPARISON:  None Available. FINDINGS: Gallbladder: No gallstones or  wall thickening visualized. No sonographic Murphy sign noted by sonographer. Common bile duct: Diameter: 4 mm which is within normal limits Liver: No focal lesion identified. Within normal limits in parenchymal echogenicity. Portal vein is patent  on color Doppler imaging with normal direction of blood flow towards the liver. Other: None. IMPRESSION: No definite abnormality seen in the right upper quadrant of the abdomen. Electronically Signed   By: Marijo Conception M.D.   On: 06/05/2022 10:33   DG CHEST PORT 1 VIEW  Result Date: 06/05/2022 CLINICAL DATA:  Cough. EXAM: PORTABLE CHEST 1 VIEW COMPARISON:  11/19/2021 FINDINGS: 0424 hours. There is some mild retrocardiac left base atelectasis or infiltrate. No focal airspace disease in the right lung. Left suprahilar scarring is stable. Interstitial markings are diffusely coarsened with chronic features. Cardiopericardial silhouette is at upper limits of normal for size. Left permanent pacemaker noted. Telemetry leads overlie the chest. IMPRESSION: Retrocardiac left base atelectasis or infiltrate. Otherwise stable exam. Electronically Signed   By: Misty Stanley M.D.   On: 06/05/2022 05:27   CUP PACEART REMOTE DEVICE CHECK  Result Date: 05/24/2022 Scheduled remote reviewed. Normal device function.  Next remote 91 days. LA   Micro Results   Recent Results (from the past 240 hour(s))  SARS Coronavirus 2 by RT PCR (hospital order, performed in [facility] hospital lab) *cepheid single result test* Anterior Nasal Swab     Status: None   Collection Time: 06/03/22 11:47 AM   Specimen: Anterior Nasal Swab  Result Value Ref Range Status   SARS Coronavirus 2 by RT PCR NEGATIVE NEGATIVE Final    Comment: (NOTE) SARS-CoV-2 target nucleic acids are NOT DETECTED.  The SARS-CoV-2 RNA is generally detectable in upper and lower respiratory specimens during the acute phase of infection. The lowest concentration of SARS-CoV-2 viral copies this assay can detect is 250 copies / mL. A negative result does not preclude SARS-CoV-2 infection and should not be used as the sole basis for treatment or other patient management decisions.  A negative result may occur with improper specimen collection / handling,  submission of specimen other than nasopharyngeal swab, presence of viral mutation(s) within the areas targeted by this assay, and inadequate number of viral copies (<250 copies / mL). A negative result must be combined with clinical observations, patient history, and epidemiological information.  Fact Sheet for Patients:   https://www.patel.info/  Fact Sheet for Healthcare Providers: https://hall.com/  This test is not yet approved or  cleared by the [place] FDA and has been authorized for detection and/or diagnosis of SARS-CoV-2 by FDA under an Emergency Use Authorization (EUA).  This EUA will remain in effect (meaning this test can be used) for the duration of the COVID-19 declaration under Section 564(b)(1) of the Act, 21 U.S.C. section 360bbb-3(b)(1), unless the authorization is terminated or revoked sooner.  Performed at [facility], [address], [place], [place] [address]   C Difficile Quick Screen w PCR reflex     Status: None   Collection Time: 06/03/22  6:02 PM   Specimen: STOOL  Result Value Ref Range Status   C Diff antigen NEGATIVE NEGATIVE Final   C Diff toxin NEGATIVE NEGATIVE Final   C Diff interpretation No C. difficile detected.  Final    Comment: Performed at [facility], [address], [place], [place] [address]  Gastrointestinal Panel by PCR , Stool     Status: None   Collection Time: 06/03/22  6:02 PM   Specimen: STOOL  Result  Value Ref Range Status   Campylobacter species NOT DETECTED NOT DETECTED Final   Plesimonas shigelloides NOT DETECTED NOT DETECTED Final   Salmonella species NOT DETECTED NOT DETECTED Final   Yersinia enterocolitica NOT DETECTED NOT DETECTED Final   Vibrio species NOT DETECTED NOT DETECTED Final   Vibrio cholerae NOT DETECTED NOT DETECTED Final   Enteroaggregative E coli (EAEC) NOT DETECTED NOT DETECTED Final   Enteropathogenic E coli (EPEC) NOT DETECTED NOT DETECTED Final    Enterotoxigenic E coli (ETEC) NOT DETECTED NOT DETECTED Final   Shiga like toxin producing E coli (STEC) NOT DETECTED NOT DETECTED Final   Shigella/Enteroinvasive E coli (EIEC) NOT DETECTED NOT DETECTED Final   Cryptosporidium NOT DETECTED NOT DETECTED Final   Cyclospora cayetanensis NOT DETECTED NOT DETECTED Final   Entamoeba histolytica NOT DETECTED NOT DETECTED Final   Giardia lamblia NOT DETECTED NOT DETECTED Final   Adenovirus F40/41 NOT DETECTED NOT DETECTED Final   Astrovirus NOT DETECTED NOT DETECTED Final   Norovirus GI/GII NOT DETECTED NOT DETECTED Final   Rotavirus A NOT DETECTED NOT DETECTED Final   Sapovirus (I, II, IV, and V) NOT DETECTED NOT DETECTED Final    Comment: Performed at [facility], [address] [place], [place] [address]  Urine Culture     Status: Abnormal   Collection Time: 06/04/22  6:45 AM   Specimen: Urine, Clean Catch  Result Value Ref Range Status   Specimen Description   Final    URINE, CLEAN CATCH Performed at [facility], [address], [place], [place] [address]    Special Requests   Final    NONE Performed at [facility], [address] [place], [place] [address]    Culture >=100,000 COLONIES/mL ESCHERICHIA COLI (A)  Final   Report Status 06/07/2022 FINAL  Final   Organism ID, Bacteria ESCHERICHIA COLI (A)  Final      Susceptibility   Escherichia coli - MIC*    AMPICILLIN >=32 RESISTANT Resistant     CEFAZOLIN <=4 SENSITIVE Sensitive     CEFEPIME <=0.12 SENSITIVE Sensitive     CEFTRIAXONE <=0.25 SENSITIVE Sensitive     CIPROFLOXACIN <=0.25 SENSITIVE Sensitive     GENTAMICIN >=16 RESISTANT Resistant     IMIPENEM <=0.25 SENSITIVE Sensitive     NITROFURANTOIN 32 SENSITIVE Sensitive     TRIMETH/SULFA >=320 RESISTANT Resistant     AMPICILLIN/SULBACTAM 16 INTERMEDIATE Intermediate     PIP/TAZO <=4 SENSITIVE Sensitive     * >=100,000 COLONIES/mL ESCHERICHIA COLI    Today   Subjective    Breyon Blass today has no new  complaints -Oral intake is better  No Nausea, Vomiting or Diarrhea No fever  Or chills           Patient has been seen and examined prior to discharge   Objective   Blood pressure 122/87, pulse 72, temperature 97.9 F (36.6 C), temperature source Oral, resp. rate 19, height 4\' 9"  (1.448 m), weight 48.7 kg, SpO2 96 %.   Intake/Output Summary (Last 24 hours) at 06/10/2022 1008 Last data filed at 06/10/2022 0600 Gross per 24 hour  Intake 720 ml  Output 750 ml  Net -30 ml    Exam Gen:- Awake Alert, no acute distress  HEENT:- [place].AT, No sclera icterus Neck-Supple Neck,No JVD,.  Lungs-  CTAB , good air movement bilaterally CV- S1, S2 normal, regular Abd-  +ve B.Sounds, Abd Soft, No tenderness,    Extremity/Skin:- No  edema,   good pulses Psych-affect is appropriate,  oriented x3, cooperative and coherent Neuro-no new focal deficits, no tremors    Data Review   CBC w Diff:  Lab Results  Component Value Date   WBC 7.4 06/08/2022   HGB 12.0 06/08/2022   HGB 11.9 11/01/2021   HCT 36.2 06/08/2022   HCT 35.2 11/01/2021   PLT 244 06/08/2022   PLT 284 11/01/2021   LYMPHOPCT 9 02/20/2021   MONOPCT 14 02/20/2021   EOSPCT 1 02/20/2021   BASOPCT 1 02/20/2021    CMP:  Lab Results  Component Value Date   NA 134 (L) 06/09/2022   NA 137 11/01/2021   K 4.6 06/09/2022   CL 106 06/09/2022   CO2 24 06/09/2022   BUN 10 06/09/2022   BUN 4 (L) 11/01/2021   CREATININE 0.72 06/09/2022   PROT 5.6 (L) 06/08/2022   PROT 6.5 09/21/2021   ALBUMIN 2.5 (L) 06/09/2022   ALBUMIN 4.3 09/21/2021   BILITOT 0.3 06/08/2022   BILITOT <0.2 09/21/2021   ALKPHOS 71 06/08/2022   AST 21 06/08/2022   ALT 18 06/08/2022  .  Total Discharge time is about 33 minutes  Roxan Hockey M.D on 06/10/2022 at 10:08 AM  Go to [email or web address] -  for contact info  [facility] - Office  [phone]

## 2022-06-10 NOTE — TOC Transition Note (Signed)
Transition of Care [facility]) - CM/SW Discharge Note   Patient Details  Name: Colleen Fleming MRN: [id number] Date of Birth: 30-Aug-1956  [facility] CM/SW Contact:  Boneta Lucks, RN Phone Number: 06/10/2022, 10:36 AM   Clinical Narrative:   [facility] assessed and will accept patient back. FL2 completed. Faxed FL2 and PT note to [facility], called to confirm and approve transport. [name] her son will come take her back to [facility].   Final next level of care: Assisted Living Barriers to Discharge: Barriers Resolved   Patient Goals and CMS Choice   CMS Medicare.gov Compare Post Acute Care list provided to:: Patient Represenative (must comment) Choice offered to / list presented to : Adult Children  Discharge Placement                Patient to be transferred to facility by: [name] Name of family member notified: [name] Patient and family notified of of transfer: 06/10/22  Discharge Plan and Services     Post Acute Care Choice: [place]                 Readmission Risk Interventions    06/07/2022    1:39 PM 02/04/2021   12:40 PM  Readmission Risk Prevention Plan  Post Dischage Appt Complete Complete  Medication Screening Complete   Transportation Screening Complete Complete

## 2022-06-10 NOTE — NC FL2 (Signed)
[place] LEVEL OF CARE SCREENING TOOL     IDENTIFICATION  Patient Name: Colleen Fleming Birthdate: 1956-06-09 Sex: female Admission Date (Current Location): 06/03/2022  [facility] and [place] Number:  [employer] and Address:  [place] [address] [place]      Provider Number: [id number]  Attending Physician Name and Address:  Roxan Hockey, MD  Relative Name and Phone Number:  Tyyne, Cliett [phone])   [phone]    Current Level of Care: Hospital Recommended Level of Care: [place] Prior Approval Number:    Date Approved/Denied:   PASRR Number: [id number]  Discharge Plan: SNF    Current Diagnoses: Patient Active Problem List   Diagnosis Date Noted   Hypotension (arterial) 08/16/2021   Ischemic cardiomyopathy 08/16/2021   Fall    Chronic combined systolic and diastolic CHF, NYHA class 2 ([place]) 02/13/2021   Hyponatremia 02/10/2021   Pericardial effusion 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 [facility]) 02/01/2021   Acute ST elevation myocardial infarction (STEMI) involving left anterior descending (LAD) coronary artery ([address]) 01/30/2021   Hypokalemia    Panlobular emphysema ([place])    Coronary artery disease involving native coronary artery of native heart with unstable angina pectoris (HCC)    GERD (gastroesophageal reflux disease) 11/25/2011   Nicotine abuse 11/25/2011   Hyperlipidemia with target LDL less than 70 11/25/2011   IBS (irritable bowel syndrome) 11/25/2011   Anxiety as acute reaction to exceptional stress 11/25/2011    Orientation RESPIRATION BLADDER Height & Weight     Self, Place  Normal Continent Weight: 48.7 kg Height:  4\' 9"  (144.8 cm)  BEHAVIORAL SYMPTOMS/MOOD NEUROLOGICAL BOWEL NUTRITION STATUS      Continent Diet (Regular)  AMBULATORY STATUS COMMUNICATION OF NEEDS Skin   Limited Assist Verbally Normal                       Personal Care Assistance Level of Assistance  Bathing,  Feeding, Dressing Bathing Assistance: Limited assistance Feeding assistance: Independent Dressing Assistance: Limited assistance     Functional Limitations Info  Sight, Hearing, Speech Sight Info: Impaired Hearing Info: Impaired Speech Info: Adequate    SPECIAL CARE FACTORS FREQUENCY  PT (By licensed PT)     PT Frequency: 3 times a week              Contractures Contractures Info: Not present    Additional Factors Info  Code Status, Allergies Code Status Info: FULL Allergies Info: Penicillins           Current Medications (06/10/2022):  This is the current hospital active medication list Current Facility-Administered Medications  Medication Dose Route Frequency Provider Last Rate Last Admin   acetaminophen (TYLENOL) tablet 650 mg  650 mg Oral Q6H PRN Kathie Dike, MD   650 mg at 06/05/22 1931   Or   acetaminophen (TYLENOL) suppository 650 mg  650 mg Rectal Q6H PRN Kathie Dike, MD       alum & mag hydroxide-simeth (MAALOX/MYLANTA) 200-200-20 MG/5ML suspension 15 mL  15 mL Oral Q6H PRN Kathie Dike, MD   15 mL at 06/06/22 1924   aspirin EC tablet 81 mg  81 mg Oral q AM Kathie Dike, MD   81 mg at 06/10/22 0613   atorvastatin (LIPITOR) tablet 80 mg  80 mg Oral Daily Kathie Dike, MD   80 mg at 06/10/22 0818   calcium carbonate (TUMS - dosed in mg elemental calcium) chewable tablet 200 mg of elemental calcium  1 tablet Oral BID PRN Zierle-Ghosh, Asia B, DO   200 mg of elemental calcium at 06/06/22 1630   cephALEXin (KEFLEX) capsule 500 mg  500 mg Oral Q8H Kathie Dike, MD   500 mg at 06/10/22 7824   digoxin (LANOXIN) tablet 62.5 mcg  62.5 mcg Oral q AM Kathie Dike, MD   62.5 mcg at 06/10/22 2353   divalproex (DEPAKOTE SPRINKLE) capsule 125 mg  125 mg Oral BID Kathie Dike, MD   125 mg at 06/10/22 0818   enoxaparin (LOVENOX) injection 40 mg  40 mg Subcutaneous Q24H Kathie Dike, MD   40 mg at 06/09/22 1808   famotidine (PEPCID) tablet 20 mg  20 mg  Oral QHS Zierle-Ghosh, Asia B, DO   20 mg at 06/09/22 2159   feeding supplement (ENSURE ENLIVE / ENSURE PLUS) liquid 237 mL  237 mL Oral BID BM Kathie Dike, MD   237 mL at 06/10/22 0819   FLUoxetine (PROZAC) capsule 10 mg  10 mg Oral Daily Kathie Dike, MD   10 mg at 06/10/22 0818   lipase/protease/amylase (CREON) capsule 12,000 Units  12,000 Units Oral TID Chaney Born, Courage, MD   12,000 Units at 06/10/22 0818   loperamide (IMODIUM) capsule 2 mg  2 mg Oral BID Kathie Dike, MD   2 mg at 06/10/22 0818   nystatin (MYCOSTATIN/NYSTOP) topical powder   Topical BID Zierle-Ghosh, Asia B, DO   Given at 06/10/22 0900   ondansetron (ZOFRAN) tablet 4 mg  4 mg Oral Q6H PRN Kathie Dike, MD       Or   ondansetron (ZOFRAN) injection 4 mg  4 mg Intravenous Q6H PRN Kathie Dike, MD       thiamine (VITAMIN B1) tablet 100 mg  100 mg Oral Daily Kathie Dike, MD   100 mg at 06/10/22 0818   ticagrelor (BRILINTA) tablet 90 mg  90 mg Oral BID Kathie Dike, MD   90 mg at 06/10/22 0818     Discharge Medications:  Medication List       STOP taking these medications     alum & mag hydroxide-simeth 200-200-20 MG/5ML suspension Commonly known as: MAALOX/MYLANTA    ibuprofen 800 MG tablet Commonly known as: ADVIL    potassium chloride SA 20 MEQ tablet Commonly known as: KLOR-CON M           TAKE these medications     acetaminophen 500 MG tablet Commonly known as: TYLENOL Take 500 mg by mouth every 6 (six) hours as needed for mild pain.    aspirin EC 81 MG tablet Take 81 mg by mouth in the morning. (0900) Swallow whole.    atorvastatin 80 MG tablet Commonly known as: LIPITOR Take 1 tablet (80 mg total) by mouth daily.    Brilinta 90 MG Tabs tablet Generic drug: ticagrelor Take 1 tablet (90 mg total) by mouth 2 (two) times daily.    calcium carbonate 750 MG chewable tablet Commonly known as: TUMS EX Chew 2 tablets by mouth daily as needed for heartburn.    cephALEXin 500  MG capsule Commonly known as: KEFLEX Take 1 capsule (500 mg total) by mouth 3 (three) times daily for 2 days.    Depakote Sprinkles 125 MG capsule Generic drug: divalproex Take 125 mg by mouth 2 (two) times daily.    Digoxin 62.5 MCG Tabs Take 62.5 mcg by mouth in the morning. (0900)    famotidine 20 MG tablet Commonly known as: PEPCID Take 1 tablet (20 mg total)  by mouth at bedtime.    furosemide 40 MG tablet Commonly known as: Lasix Take 1 tablet (40 mg total) by mouth daily as needed for fluid or edema (for weight gain more than 3 lbs in a day , leg edema).    lipase/protease/amylase 12000-38000 units Cpep capsule Commonly known as: CREON Take 1 capsule (12,000 Units total) by mouth 3 (three) times daily before meals.    lisinopril 10 MG tablet Commonly known as: ZESTRIL Take 10 mg by mouth in the morning and at bedtime.    loperamide 2 MG capsule Commonly known as: IMODIUM Take 2 mg by mouth as needed for diarrhea or loose stools.    metoprolol succinate 25 MG 24 hr tablet Commonly known as: Toprol XL Take 0.5 tablets (12.5 mg total) by mouth daily.    midodrine 2.5 MG tablet Commonly known as: PROAMATINE Only give if Systolic blood pressure is less than 105 mmhg What changed:  how much to take how to take this when to take this additional instructions    nitroGLYCERIN 0.4 MG SL tablet Commonly known as: NITROSTAT Place 1 tablet (0.4 mg total) under the tongue every 5 (five) minutes x 3 doses as needed for chest pain.    OLANZapine 2.5 MG tablet Commonly known as: ZYPREXA Take 2.5 mg by mouth daily as needed (agitation/paranoia).    potassium chloride 10 MEQ tablet Commonly known as: KLOR-CON Take 1 tablet (10 mEq total) by mouth as needed. Take While taking Lasix/furosemide    PROzac 10 MG capsule Generic drug: FLUoxetine Take 10 mg by mouth daily.    sodium fluoride 1.1 % Crea dental cream Commonly known as: PREVIDENT 5000 PLUS Place 1 application  onto teeth every evening. (2100)    thiamine 100 MG tablet Commonly known as: Vitamin B-1 Take 1 tablet (100 mg total) by mouth daily. Start taking on: June 11, 2022    Vitamin D3 25 MCG (1000 UT) Caps Take 1 capsule by mouth daily.      Relevant Imaging Results:  Relevant Lab Results:   Additional Information SS# [id number]  Boneta Lucks, RN

## 2022-06-19 NOTE — Progress Notes (Signed)
Remote ICD transmission.   

## 2022-06-26 ENCOUNTER — Other Ambulatory Visit: Payer: Self-pay

## 2022-06-26 ENCOUNTER — Encounter (HOSPITAL_COMMUNITY): Payer: Self-pay

## 2022-06-26 ENCOUNTER — Emergency Department (HOSPITAL_COMMUNITY)
Admission: EM | Admit: 2022-06-26 | Discharge: 2022-06-26 | Disposition: A | Discharge status: Home / Self Care | Payer: Medicaid Other | Attending: Emergency Medicine | Admitting: Emergency Medicine

## 2022-06-26 DIAGNOSIS — E876 Hypokalemia: Secondary | ICD-10-CM | POA: Diagnosis not present

## 2022-06-26 DIAGNOSIS — N390 Urinary tract infection, site not specified: Secondary | ICD-10-CM | POA: Insufficient documentation

## 2022-06-26 DIAGNOSIS — R112 Nausea with vomiting, unspecified: Secondary | ICD-10-CM | POA: Insufficient documentation

## 2022-06-26 DIAGNOSIS — Z79899 Other long term (current) drug therapy: Secondary | ICD-10-CM | POA: Insufficient documentation

## 2022-06-26 DIAGNOSIS — Z7982 Long term (current) use of aspirin: Secondary | ICD-10-CM | POA: Diagnosis not present

## 2022-06-26 DIAGNOSIS — F0390 Unspecified dementia without behavioral disturbance: Secondary | ICD-10-CM | POA: Insufficient documentation

## 2022-06-26 DIAGNOSIS — R1013 Epigastric pain: Secondary | ICD-10-CM | POA: Diagnosis not present

## 2022-06-26 DIAGNOSIS — Z85118 Personal history of other malignant neoplasm of bronchus and lung: Secondary | ICD-10-CM | POA: Insufficient documentation

## 2022-06-26 DIAGNOSIS — I5042 Chronic combined systolic (congestive) and diastolic (congestive) heart failure: Secondary | ICD-10-CM | POA: Diagnosis not present

## 2022-06-26 DIAGNOSIS — J449 Chronic obstructive pulmonary disease, unspecified: Secondary | ICD-10-CM | POA: Diagnosis not present

## 2022-06-26 DIAGNOSIS — R4182 Altered mental status, unspecified: Secondary | ICD-10-CM | POA: Insufficient documentation

## 2022-06-26 DIAGNOSIS — K219 Gastro-esophageal reflux disease without esophagitis: Secondary | ICD-10-CM | POA: Insufficient documentation

## 2022-06-26 LAB — COMPREHENSIVE METABOLIC PANEL
ALT: 14 U/L (ref 0–44)
AST: 27 U/L (ref 15–41)
Albumin: 3.4 g/dL — ABNORMAL LOW (ref 3.5–5.0)
Alkaline Phosphatase: 83 U/L (ref 38–126)
Anion gap: 11 (ref 5–15)
BUN: 11 mg/dL (ref 8–23)
CO2: 26 mmol/L (ref 22–32)
Calcium: 8.7 mg/dL — ABNORMAL LOW (ref 8.9–10.3)
Chloride: 97 mmol/L — ABNORMAL LOW (ref 98–111)
Creatinine, Ser: 0.85 mg/dL (ref 0.44–1.00)
GFR, Estimated: >60 mL/min (ref >60)
Glucose, Bld: 126 mg/dL — ABNORMAL HIGH (ref 70–99)
Potassium: 3.3 mmol/L — ABNORMAL LOW (ref 3.5–5.1)
Sodium: 134 mmol/L — ABNORMAL LOW (ref 135–145)
Total Bilirubin: 0.6 mg/dL (ref 0.3–1.2)
Total Protein: 6.7 g/dL (ref 6.5–8.1)

## 2022-06-26 LAB — URINALYSIS, ROUTINE W REFLEX MICROSCOPIC
Bilirubin Urine: NEGATIVE
Glucose, UA: NEGATIVE mg/dL
Ketones, ur: 5 mg/dL — AB
Leukocytes,Ua: NEGATIVE
Nitrite: NEGATIVE
Protein, ur: NEGATIVE mg/dL
Specific Gravity, Urine: 1.013 (ref 1.005–1.030)
pH: 5 (ref 5.0–8.0)

## 2022-06-26 LAB — CBC WITH DIFFERENTIAL/PLATELET
Abs Immature Granulocytes: 0.05 10*3/uL (ref 0.00–0.07)
Basophils Absolute: 0 10*3/uL (ref 0.0–0.1)
Basophils Relative: 0 %
Eosinophils Absolute: 0 10*3/uL (ref 0.0–0.5)
Eosinophils Relative: 0 %
HCT: 34.2 % — ABNORMAL LOW (ref 36.0–46.0)
Hemoglobin: 11.5 g/dL — ABNORMAL LOW (ref 12.0–15.0)
Immature Granulocytes: 0 %
Lymphocytes Relative: 4 %
Lymphs Abs: 0.5 10*3/uL — ABNORMAL LOW (ref 0.7–4.0)
MCH: 31.8 pg (ref 26.0–34.0)
MCHC: 33.6 g/dL (ref 30.0–36.0)
MCV: 94.5 fL (ref 80.0–100.0)
Monocytes Absolute: 0.9 10*3/uL (ref 0.1–1.0)
Monocytes Relative: 7 %
Neutro Abs: 11.3 10*3/uL — ABNORMAL HIGH (ref 1.7–7.7)
Neutrophils Relative %: 89 %
Platelets: 338 10*3/uL (ref 150–400)
RBC: 3.62 MIL/uL — ABNORMAL LOW (ref 3.87–5.11)
RDW: 13.7 % (ref 11.5–15.5)
WBC: 12.7 10*3/uL — ABNORMAL HIGH (ref 4.0–10.5)
nRBC: 0 % (ref 0.0–0.2)

## 2022-06-26 LAB — TROPONIN I (HIGH SENSITIVITY)
Troponin I (High Sensitivity): 12 ng/L (ref <18)
Troponin I (High Sensitivity): 15 ng/L (ref <18)

## 2022-06-26 LAB — LIPASE, BLOOD: Lipase: 21 U/L (ref 11–51)

## 2022-06-26 MED ORDER — SODIUM CHLORIDE 0.9 % IV BOLUS
500.0000 mL | Freq: Once | INTRAVENOUS | Status: AC
  Administered 2022-06-26: 500 mL via INTRAVENOUS

## 2022-06-26 MED ORDER — ONDANSETRON HCL 4 MG/2ML IJ SOLN: 4.0000 mg | Freq: Once | INTRAMUSCULAR | Status: DC

## 2022-06-26 MED ORDER — CEPHALEXIN 500 MG PO CAPS
500.0000 mg | ORAL_CAPSULE | Freq: Once | ORAL | Status: AC
  Administered 2022-06-26: 500 mg via ORAL
  Filled 2022-06-26: qty 1

## 2022-06-26 MED ORDER — POTASSIUM CHLORIDE CRYS ER 20 MEQ PO TBCR: 20.0000 meq | EXTENDED_RELEASE_TABLET | Freq: Two times a day (BID) | ORAL | 0 refills | Status: DC

## 2022-06-26 MED ORDER — CEPHALEXIN 500 MG PO CAPS: 500.0000 mg | ORAL_CAPSULE | Freq: Four times a day (QID) | ORAL | 0 refills | Status: DC

## 2022-06-26 NOTE — ED Notes (Signed)
Patient able to tolerate PO liquids

## 2022-06-26 NOTE — ED Triage Notes (Signed)
Patient brought in via ems from [place]. Patient sent out due to vomiting dark liquid and some altered mental. Patient oriented to self and place only in triage. Patient c/o right hip pain for "24 hours". Patient was found to have Listerine and peroxide in room that was not allowed as well.

## 2022-06-26 NOTE — Discharge Instructions (Signed)
Urine cultures been sent.  He will be notified if the antibiotics do not cover the organism.  Follow-up with her doctor as needed.  Return if she cannot tolerate orals or appears to worsen.

## 2022-06-26 NOTE — ED Provider Notes (Signed)
[facility] EMERGENCY DEPARTMENT Provider Note   CSN: [id number] Arrival date & time: 06/26/22  1017     History  Chief Complaint  Patient presents with   Emesis   Altered Mental Status    Colleen Fleming is a 66 y.o. female.   Emesis Altered Mental Status Associated symptoms: vomiting   Patient presents with reported nausea and vomiting.  Reportedly had been vomiting dark liquid and some altered mental status.  Patient states she feels bad has been throwing up.  States she has some ulcers in her mouth that is why she had Listerine and peroxide in her room.  Does have reported some baseline dementia.  Recent admission to hospital for electrolyte abnormalities and had confusion at that time.  Mild epigastric tenderness.    Past Medical History:  Diagnosis Date   Acute ST elevation myocardial infarction (STEMI) involving left anterior descending (LAD) coronary artery ([place]) 01/30/2021   99% thrombotic subtotal occlusion of Prox LAD => (DES PCI LAD).  apical LAD occlusion with distal embolization.  EF estimated 40-45% w/ mid-apical anterior HK; ~ normal LVEDP with SBP 98 mmHg. => Echo EF 20 to 25% with entire anterior wall hypokinesis and apical akinesis.   Anxiety and depression    Chronic combined systolic and diastolic CHF, NYHA class 2 ([place]) 02/13/2021   EF 25%.  GR 2 DD.   COPD (chronic obstructive pulmonary disease) (HCC)    Long-term smoker greater than 35 years 1 to 2 pack a day.   Coronary artery disease involving native coronary artery of native heart with unstable angina pectoris ([place])    Presented with anterior STEMI-99% subtotal occluded proximal LAD-DES PCI (Synergy DES 2.5 mm 60 mm - 2.8 mm) distally apical thromboembolism.  Reduced EF with anterior and apical hypokinesis.   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  On midodrine for blood pressure support.  Previously not able to tolerate beta-blocker or other CHF medications.   Ischemic cardiomyopathy 08/16/2021   EF 20 to 25% following anterior STEMI, despite LAD PCI.Marland Kitchen  Unable to titrate medications due to hypotension.   Obesity    Panlobular emphysema ([place])    Small cell lung cancer, left upper lobe ([place]) 2014   Treated with radiation and chemotherapy ([facility])    Home Medications Prior to Admission medications   Medication Sig Start Date End Date Taking? Authorizing Provider  acetaminophen (TYLENOL) 500 MG tablet Take 500 mg by mouth every 6 (six) hours as needed for mild pain.    [provider]  aspirin EC 81 MG tablet Take 81 mg by mouth in the morning. (0900) Swallow whole.    [provider]  atorvastatin (LIPITOR) 80 MG tablet Take 1 tablet (80 mg total) by mouth daily. 02/05/21   Lyda Jester M, [name]  calcium carbonate (TUMS EX) 750 MG chewable tablet Chew 2 tablets by mouth daily as needed for heartburn.    [provider]  Cholecalciferol (VITAMIN D3) 25 MCG (1000 UT) CAPS Take 1 capsule by mouth daily. 02/10/22   [provider]  DEPAKOTE SPRINKLES 125 MG capsule Take 125 mg by mouth 2 (two) times daily. 01/31/22   [provider]  Digoxin 62.5 MCG  TABS Take 62.5 mcg by mouth in the morning. (0900)    [provider]  famotidine (PEPCID) 20 MG tablet Take 1 tablet (20 mg total) by mouth at bedtime. 06/10/22   Roxan Hockey, MD  furosemide (LASIX) 40 MG tablet Take 1 tablet (40 mg total) by mouth daily as needed for fluid or edema (for weight gain more than 3 lbs in a day , leg edema). 02/20/21 06/03/22  Barb Merino, MD  lipase/protease/amylase (CREON) 12000-38000 units CPEP capsule Take 1 capsule (12,000 Units total) by mouth 3 (three) times daily  before meals. 06/10/22   Roxan Hockey, MD  lisinopril (ZESTRIL) 10 MG tablet Take 10 mg by mouth in the morning and at bedtime.    [provider]  loperamide (IMODIUM) 2 MG capsule Take 2 mg by mouth as needed for diarrhea or loose stools.    [provider]  metoprolol succinate (TOPROL XL) 25 MG 24 hr tablet Take 0.5 tablets (12.5 mg total) by mouth daily. Patient not taking: Reported on 06/04/2022 08/16/21   Leonie Man, MD  midodrine (PROAMATINE) 2.5 MG tablet Only give if Systolic blood pressure is less than 105 mmhg Patient taking differently: Take 2.5 mg by mouth in the morning and at bedtime. (0800 & 2000) Only give if Systolic blood pressure is less than 105 mmhg 09/21/21   Swinyer, Lanice Schwab, NP  nitroGLYCERIN (NITROSTAT) 0.4 MG SL tablet Place 1 tablet (0.4 mg total) under the tongue every 5 (five) minutes x 3 doses as needed for chest pain. 02/04/21   Lyda Jester M, [name]  OLANZapine (ZYPREXA) 2.5 MG tablet Take 2.5 mg by mouth daily as needed (agitation/paranoia).    [provider]  potassium chloride (KLOR-CON) 10 MEQ tablet Take 1 tablet (10 mEq total) by mouth as needed. Take While taking Lasix/furosemide 06/10/22   Roxan Hockey, MD  PROZAC 10 MG capsule Take 10 mg by mouth daily. 01/31/22   [provider]  sodium fluoride (PREVIDENT 5000 PLUS) 1.1 % CREA dental cream Place 1 application onto teeth every evening. (2100)    [provider]  thiamine (VITAMIN B-1) 100 MG tablet Take 1 tablet (100 mg total) by mouth daily. 06/11/22   Roxan Hockey, MD  ticagrelor (BRILINTA) 90 MG TABS tablet Take 1 tablet (90 mg total) by mouth 2 (two) times daily. 02/04/21   Consuelo Pandy, [name]      Allergies    Penicillins    Review of Systems   Review of Systems  Gastrointestinal:  Positive for vomiting.    Physical Exam Updated Vital Signs BP 114/74   Pulse 74   Temp 97.9 F (36.6 C) (Oral)   Resp 13   SpO2 93%  Physical  Exam Vitals reviewed.  Eyes:     Pupils: Pupils are equal, round, and reactive to light.  Cardiovascular:     Rate and Rhythm: Regular rhythm.  Pulmonary:     Breath sounds: No wheezing.  Abdominal:     Comments: Mild epigastric and without rebound or guarding.  No hernia palpated.  Musculoskeletal:        General: No tenderness.     Cervical back: Neck supple.  Skin:    General: Skin is warm.     Capillary Refill: Capillary refill takes less than 2 seconds.  Neurological:     Mental Status: She is alert.     Comments: Awake and pleasant.  Able to identify herself and that she is at the hospital.  ED Results / Procedures / Treatments   Labs (all labs ordered are listed, but only abnormal results are displayed) Labs Reviewed  COMPREHENSIVE METABOLIC PANEL - Abnormal; Notable for the following components:      Result Value   Sodium 134 (*)    Potassium 3.3 (*)    Chloride 97 (*)    Glucose, Bld 126 (*)    Calcium 8.7 (*)    Albumin 3.4 (*)    All other components within normal limits  CBC WITH DIFFERENTIAL/PLATELET - Abnormal; Notable for the following components:   WBC 12.7 (*)    RBC 3.62 (*)    Hemoglobin 11.5 (*)    HCT 34.2 (*)    Neutro Abs 11.3 (*)    Lymphs Abs 0.5 (*)    All other components within normal limits  LIPASE, BLOOD  URINALYSIS, ROUTINE W REFLEX MICROSCOPIC  TROPONIN I (HIGH SENSITIVITY)  TROPONIN I (HIGH SENSITIVITY)    EKG None  Radiology No results found.  Procedures Procedures    Medications Ordered in ED Medications  ondansetron (ZOFRAN) injection 4 mg (4 mg Intravenous Patient Refused/Not Given 06/26/22 1114)  sodium chloride 0.9 % bolus 500 mL (0 mLs Intravenous Stopped 06/26/22 1306)    ED Course/ Medical Decision Making/ A&P                           Medical Decision Making Amount and/or Complexity of Data Reviewed Labs: ordered.  Risk Prescription drug management.   Patient with nausea vomiting and mental status  change.  Differential diagnoses long.  Has had recent electrolyte abnormalities.  We will get basic blood work EKG and work-up. Blood work reassuring.  Mild hyponatremia but improved from prior.  Also hypokalemia.  Patient appears somewhat better after first fluid bolus.  Also has mild white count elevation.  Mild anemia at baseline.  Had had some dark emesis but patient refused Hemoccult which is I think reasonable.  Patient's sister now at bedside.  Will check urinalysis.  Also give oral trial.  Care turned over to Dr. Laverta Baltimore.        Final Clinical Impression(s) / ED Diagnoses Final diagnoses:  None    Rx / DC Orders ED Discharge Orders     None         Davonna Belling, MD 06/26/22 1443

## 2022-06-26 NOTE — ED Notes (Signed)
Patient refused hemoccult. Family at bedside.

## 2022-06-29 LAB — URINE CULTURE: Culture: >=100000 — AB

## 2022-06-30 ENCOUNTER — Telehealth (HOSPITAL_BASED_OUTPATIENT_CLINIC_OR_DEPARTMENT_OTHER): Payer: Self-pay | Admitting: *Deleted

## 2022-06-30 NOTE — Telephone Encounter (Signed)
Post ED Visit - Positive Culture Follow-up  Culture report reviewed by antimicrobial stewardship pharmacist: [place] Team []  Elenor Quinones, Pharm.D. []  Heide Guile, Pharm.D., BCPS AQ-ID []  Parks Neptune, Pharm.D., BCPS []  Alycia Rossetti, Pharm.D., BCPS []  [place], Pharm.D., BCPS, AAHIVP []  Legrand Como, Pharm.D., BCPS, AAHIVP []  Salome Arnt, PharmD, BCPS []  Johnnette Gourd, PharmD, BCPS []  Hughes Better, PharmD, BCPS []  Leeroy Cha, PharmD []  Laqueta Linden, PharmD, BCPS []  Albertina Parr, PharmD  [place] Team []  Leodis Sias, PharmD []  Lindell Spar, PharmD []  Royetta Asal, PharmD []  Graylin Shiver, Rph []  Rema Fendt) Glennon Mac, PharmD []  Arlyn Dunning, PharmD []  Netta Cedars, PharmD []  Dia Sitter, PharmD []  Leone Haven, PharmD []  Gretta Arab, PharmD []  Theodis Shove, PharmD []  Peggyann Juba, PharmD []  Reuel Boom, PharmD   Positive urine culture Treated with Cephalexin, organism sensitive to the same and no further patient follow-up is required at this time.  Bertis Ruddy, PharmD  H[place]on [place] 06/30/2022, 7:40 AM

## 2022-07-09 IMAGING — DX DG CHEST 2V
2 series · 2 of 2 positions shown · non-contrast
Comparison: 02/20/2021

CLINICAL DATA: ICD placement

EXAM:
CHEST - 2 VIEW

[w chest pa]
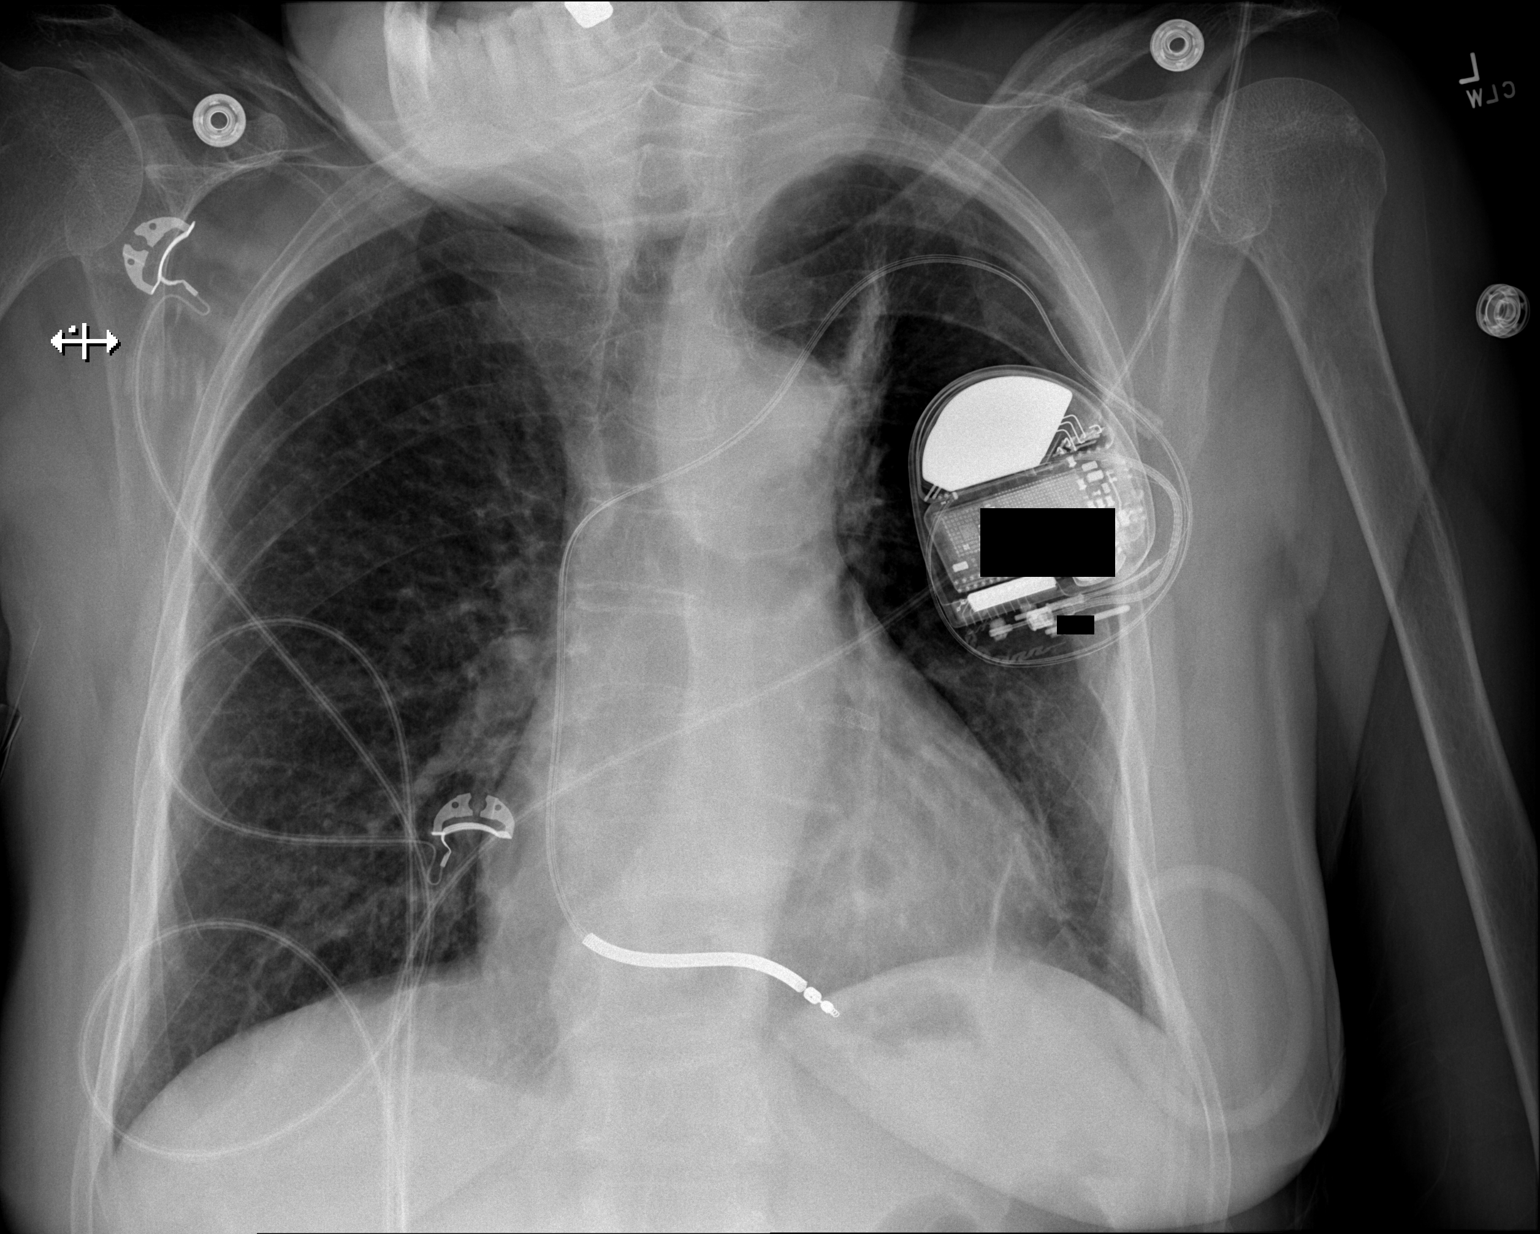

[w chest lat]
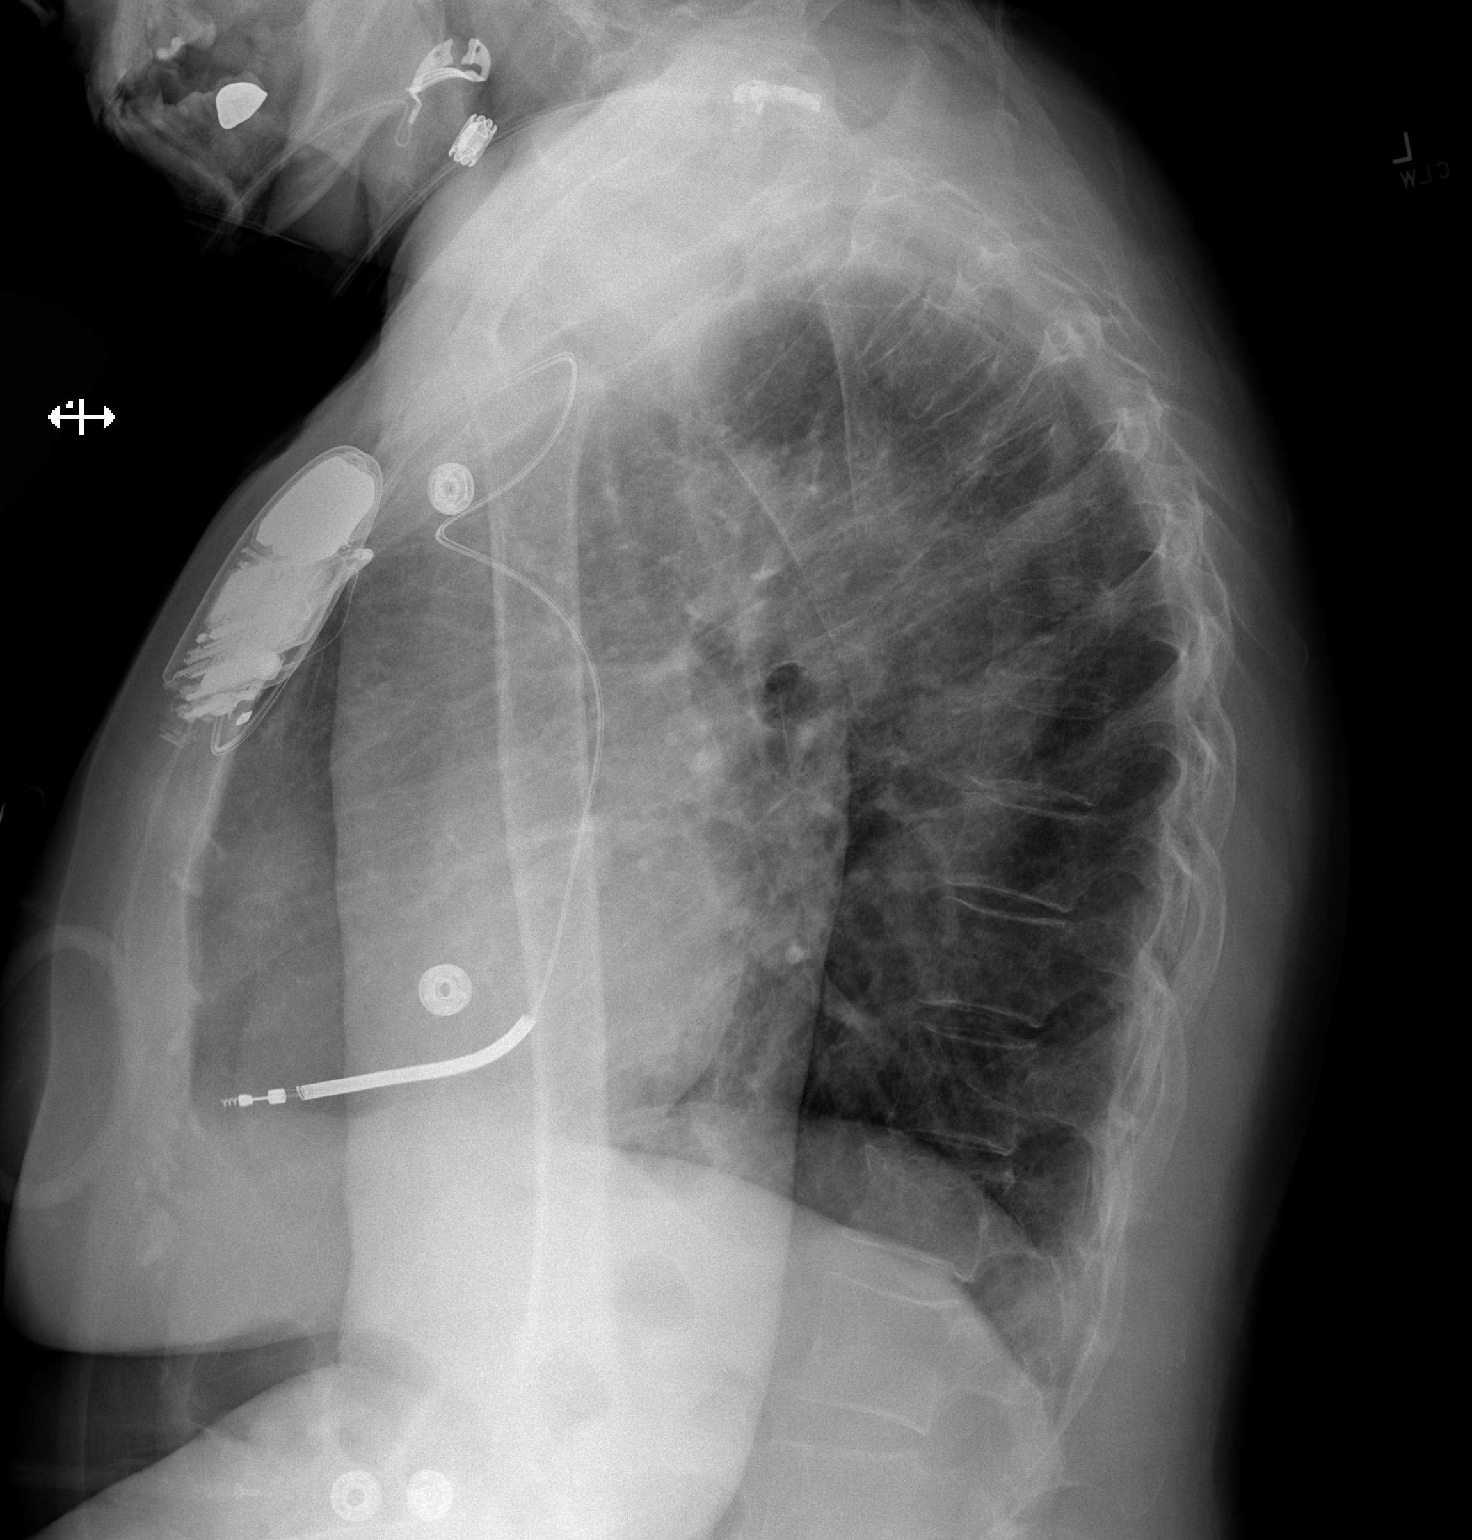

[2 of 2 positions shown; findings below may reference images not displayed]

FINDINGS: No evidence for pneumothorax or pleural effusion. Left suprahilar
scarring is similar to prior. Left base atelectasis or scarring
evident. Cardiopericardial silhouette is at upper limits of normal
for size. Bones are diffusely demineralized. Telemetry leads overlie
the chest.
IMPRESSION: 1. No pneumothorax or pleural effusion.
2. Left base atelectasis or scarring.

## 2022-07-22 ENCOUNTER — Emergency Department (HOSPITAL_COMMUNITY): Payer: Medicaid Other

## 2022-07-22 ENCOUNTER — Other Ambulatory Visit: Payer: Self-pay

## 2022-07-22 ENCOUNTER — Encounter (HOSPITAL_COMMUNITY): Payer: Self-pay

## 2022-07-22 ENCOUNTER — Emergency Department (HOSPITAL_COMMUNITY)
Admission: EM | Admit: 2022-07-22 | Discharge: 2022-07-22 | Disposition: A | Discharge status: Home / Self Care | Payer: Medicaid Other | Attending: Emergency Medicine | Admitting: Emergency Medicine

## 2022-07-22 DIAGNOSIS — Z1152 Encounter for screening for COVID-19: Secondary | ICD-10-CM | POA: Diagnosis not present

## 2022-07-22 DIAGNOSIS — R4182 Altered mental status, unspecified: Secondary | ICD-10-CM | POA: Diagnosis not present

## 2022-07-22 DIAGNOSIS — Z139 Encounter for screening, unspecified: Secondary | ICD-10-CM

## 2022-07-22 DIAGNOSIS — Z0001 Encounter for general adult medical examination with abnormal findings: Secondary | ICD-10-CM | POA: Diagnosis not present

## 2022-07-22 DIAGNOSIS — Z79899 Other long term (current) drug therapy: Secondary | ICD-10-CM | POA: Diagnosis not present

## 2022-07-22 DIAGNOSIS — R7989 Other specified abnormal findings of blood chemistry: Secondary | ICD-10-CM | POA: Insufficient documentation

## 2022-07-22 DIAGNOSIS — Z7982 Long term (current) use of aspirin: Secondary | ICD-10-CM | POA: Diagnosis not present

## 2022-07-22 LAB — CBC WITH DIFFERENTIAL/PLATELET
Abs Immature Granulocytes: 0.03 10*3/uL (ref 0.00–0.07)
Basophils Absolute: 0.1 10*3/uL (ref 0.0–0.1)
Basophils Relative: 1 %
Eosinophils Absolute: 0.1 10*3/uL (ref 0.0–0.5)
Eosinophils Relative: 1 %
HCT: 37.6 % (ref 36.0–46.0)
Hemoglobin: 12.7 g/dL (ref 12.0–15.0)
Immature Granulocytes: 0 %
Lymphocytes Relative: 18 %
Lymphs Abs: 1.2 10*3/uL (ref 0.7–4.0)
MCH: 31.5 pg (ref 26.0–34.0)
MCHC: 33.8 g/dL (ref 30.0–36.0)
MCV: 93.3 fL (ref 80.0–100.0)
Monocytes Absolute: 0.9 10*3/uL (ref 0.1–1.0)
Monocytes Relative: 12 %
Neutro Abs: 4.7 10*3/uL (ref 1.7–7.7)
Neutrophils Relative %: 68 %
Platelets: 327 10*3/uL (ref 150–400)
RBC: 4.03 MIL/uL (ref 3.87–5.11)
RDW: 14.1 % (ref 11.5–15.5)
WBC: 6.9 10*3/uL (ref 4.0–10.5)
nRBC: 0 % (ref 0.0–0.2)

## 2022-07-22 LAB — COMPREHENSIVE METABOLIC PANEL
ALT: 13 U/L (ref 0–44)
AST: 18 U/L (ref 15–41)
Albumin: 3.3 g/dL — ABNORMAL LOW (ref 3.5–5.0)
Alkaline Phosphatase: 96 U/L (ref 38–126)
Anion gap: 9 (ref 5–15)
BUN: <5 mg/dL — ABNORMAL LOW (ref 8–23)
CO2: 26 mmol/L (ref 22–32)
Calcium: 8.9 mg/dL (ref 8.9–10.3)
Chloride: 99 mmol/L (ref 98–111)
Creatinine, Ser: 0.72 mg/dL (ref 0.44–1.00)
GFR, Estimated: >60 mL/min (ref >60)
Glucose, Bld: 90 mg/dL (ref 70–99)
Potassium: 3.4 mmol/L — ABNORMAL LOW (ref 3.5–5.1)
Sodium: 134 mmol/L — ABNORMAL LOW (ref 135–145)
Total Bilirubin: 0.2 mg/dL — ABNORMAL LOW (ref 0.3–1.2)
Total Protein: 6.8 g/dL (ref 6.5–8.1)

## 2022-07-22 LAB — URINALYSIS, ROUTINE W REFLEX MICROSCOPIC
Bilirubin Urine: NEGATIVE
Glucose, UA: NEGATIVE mg/dL
Hgb urine dipstick: NEGATIVE
Ketones, ur: NEGATIVE mg/dL
Nitrite: NEGATIVE
Protein, ur: NEGATIVE mg/dL
Specific Gravity, Urine: 1.002 — ABNORMAL LOW (ref 1.005–1.030)
pH: 7 (ref 5.0–8.0)

## 2022-07-22 LAB — RESP PANEL BY RT-PCR (FLU A&B, COVID) ARPGX2
Influenza A by PCR: NEGATIVE
Influenza B by PCR: NEGATIVE
SARS Coronavirus 2 by RT PCR: NEGATIVE

## 2022-07-22 LAB — TROPONIN I (HIGH SENSITIVITY)
Troponin I (High Sensitivity): 10 ng/L (ref <18)
Troponin I (High Sensitivity): 8 ng/L (ref <18)

## 2022-07-22 LAB — LIPASE, BLOOD: Lipase: 28 U/L (ref 11–51)

## 2022-07-22 LAB — BRAIN NATRIURETIC PEPTIDE: B Natriuretic Peptide: 156 pg/mL — ABNORMAL HIGH (ref 0.0–100.0)

## 2022-07-22 LAB — CBG MONITORING, ED: Glucose-Capillary: 88 mg/dL (ref 70–99)

## 2022-07-22 LAB — VALPROIC ACID LEVEL: Valproic Acid Lvl: 27 ug/mL — ABNORMAL LOW (ref 50.0–100.0)

## 2022-07-22 LAB — MAGNESIUM: Magnesium: 1.9 mg/dL (ref 1.7–2.4)

## 2022-07-22 NOTE — ED Notes (Signed)
This nurse walked into her room and found patient with  legs in between rails, gown and monitoring device off. Patient states "get out of my bedroom." Patient able to be reoriented to the hospital and verbalized understanding of staying in bed and safety. Patient given the call bell to watch tv and told how to use it.

## 2022-07-22 NOTE — ED Provider Notes (Signed)
[facility] EMERGENCY DEPARTMENT Provider Note   CSN: [id number] Arrival date & time: 07/22/22  3614     History  Chief Complaint  Patient presents with   Altered Mental Status    Colleen Fleming is a 66 y.o. female.  Patient brought in by ambulance from her facility for evaluation of increased lethargy since yesterday.  Patient herself denies any complaints.  She denies headache chest pain shortness of breath abdominal pain vomiting diarrhea.  She said she lives in [place] and its February.  Level 5 caveat secondary to altered mental status.  The history is provided by the patient and the EMS personnel.  Altered Mental Status Presenting symptoms: lethargy   Most recent episode:  Yesterday Episode history:  Continuous Timing:  Constant Progression:  Unchanged Context: nursing home resident   Associated symptoms: no abdominal pain, no difficulty breathing, no headaches, no nausea and no vomiting        Home Medications Prior to Admission medications   Medication Sig Start Date End Date Taking? Authorizing Provider  acetaminophen (TYLENOL) 500 MG tablet Take 500 mg by mouth every 6 (six) hours as needed for mild pain.    [provider]  aspirin EC 81 MG tablet Take 81 mg by mouth in the morning. (0900) Swallow whole.    [provider]  atorvastatin (LIPITOR) 80 MG tablet Take 1 tablet (80 mg total) by mouth daily. 02/05/21   Lyda Jester M, [name]  calcium carbonate (TUMS EX) 750 MG chewable tablet Chew 2 tablets by mouth daily as needed for heartburn.    [provider]  cephALEXin (KEFLEX) 500 MG capsule Take 1 capsule (500 mg total) by mouth 4 (four) times daily. 06/26/22   Davonna Belling, MD  Cholecalciferol (VITAMIN D3) 25 MCG (1000 UT) CAPS Take 1 capsule by mouth daily. 02/10/22   [provider]  DEPAKOTE SPRINKLES 125 MG capsule Take 125 mg by mouth 2 (two) times daily. 01/31/22   [provider]  Digoxin 62.5 MCG  TABS Take 62.5 mcg by mouth in the morning. (0900)    [provider]  famotidine (PEPCID) 20 MG tablet Take 1 tablet (20 mg total) by mouth at bedtime. 06/10/22   Roxan Hockey, MD  furosemide (LASIX) 40 MG tablet Take 1 tablet (40 mg total) by mouth daily as needed for fluid or edema (for weight gain more than 3 lbs in a day , leg edema). 02/20/21 06/03/22  Barb Merino, MD  lipase/protease/amylase (CREON) 12000-38000 units CPEP capsule Take 1 capsule (12,000 Units total) by mouth 3 (three) times daily before meals. 06/10/22   Roxan Hockey, MD  lisinopril (ZESTRIL) 10 MG tablet Take 10 mg by mouth in the morning and at bedtime.    [provider]  loperamide (IMODIUM) 2 MG capsule Take 2 mg by mouth as needed for diarrhea or loose stools.    [provider]  metoprolol succinate (TOPROL XL) 25 MG 24 hr tablet Take 0.5 tablets (12.5 mg total) by mouth daily. Patient not taking: Reported on 06/04/2022 08/16/21   Leonie Man, MD  midodrine (PROAMATINE) 2.5 MG tablet Only give if Systolic blood pressure is less than 105 mmhg Patient taking differently: Take 2.5 mg by mouth in the morning and at bedtime. (0800 & 2000) Only give if Systolic blood pressure is less than 105 mmhg 09/21/21   Swinyer, Lanice Schwab, NP  nitroGLYCERIN (NITROSTAT) 0.4 MG SL tablet Place 1 tablet (0.4 mg total) under the tongue  every 5 (five) minutes x 3 doses as needed for chest pain. 02/04/21   Lyda Jester M, [name]  OLANZapine (ZYPREXA) 2.5 MG tablet Take 2.5 mg by mouth daily as needed (agitation/paranoia).    [provider]  potassium chloride (KLOR-CON) 10 MEQ tablet Take 1 tablet (10 mEq total) by mouth as needed. Take While taking Lasix/furosemide 06/10/22   Roxan Hockey, MD  potassium chloride SA (KLOR-CON M) 20 MEQ tablet Take 1 tablet (20 mEq total) by mouth 2 (two) times daily. 06/26/22   Davonna Belling, MD  PROZAC 10 MG capsule Take 10 mg by mouth daily. 01/31/22   [provider]  sodium fluoride (PREVIDENT 5000 PLUS) 1.1 % CREA dental cream Place 1 application onto teeth every evening. (2100)    [provider]  thiamine (VITAMIN B-1) 100 MG tablet Take 1 tablet (100 mg total) by mouth daily. 06/11/22   Roxan Hockey, MD  ticagrelor (BRILINTA) 90 MG TABS tablet Take 1 tablet (90 mg total) by mouth 2 (two) times daily. 02/04/21   Consuelo Pandy, [name]      Allergies    Penicillins    Review of Systems   Review of Systems  Unable to perform ROS: Mental status change  Gastrointestinal:  Negative for abdominal pain, nausea and vomiting.  Neurological:  Negative for headaches.    Physical Exam Updated Vital Signs BP (!) 126/92   Pulse 66   Temp 97.6 F (36.4 C) (Oral)   Resp 17   Ht 4\' 9"  (1.448 m)   Wt 48.7 kg   SpO2 97%   BMI 23.23 kg/m  Physical Exam Vitals and nursing note reviewed.  Constitutional:      General: She is not in acute distress.    Appearance: Normal appearance. She is well-developed.  HENT:     Head: Normocephalic and atraumatic.  Eyes:     Conjunctiva/sclera: Conjunctivae normal.  Cardiovascular:     Rate and Rhythm: Normal rate and regular rhythm.     Heart sounds: No murmur heard. Pulmonary:     Effort: Pulmonary effort is normal. No respiratory distress.     Breath sounds: Normal breath sounds.  Abdominal:     Palpations: Abdomen is soft.     Tenderness: There is no abdominal tenderness. There is no guarding or rebound.  Musculoskeletal:        General: Normal range of motion.     Cervical back: Neck supple.     Right lower leg: No edema.     Left lower leg: No edema.  Skin:    General: Skin is warm and dry.     Capillary Refill: Capillary refill takes less than 2 seconds.  Neurological:     General: No focal deficit present.     Mental Status: She is alert. She is disoriented.     Motor: No weakness.     ED Results / Procedures / Treatments   Labs (all labs ordered are listed, but  only abnormal results are displayed) Labs Reviewed  COMPREHENSIVE METABOLIC PANEL - Abnormal; Notable for the following components:      Result Value   Sodium 134 (*)    Potassium 3.4 (*)    BUN <5 (*)    Albumin 3.3 (*)    Total Bilirubin 0.2 (*)    All other components within normal limits  BRAIN NATRIURETIC PEPTIDE - Abnormal; Notable for the following components:   B Natriuretic Peptide 156.0 (*)    All other  components within normal limits  VALPROIC ACID LEVEL - Abnormal; Notable for the following components:   Valproic Acid Lvl 27 (*)    All other components within normal limits  URINALYSIS, ROUTINE W REFLEX MICROSCOPIC - Abnormal; Notable for the following components:   APPearance HAZY (*)    Specific Gravity, Urine 1.002 (*)    Leukocytes,Ua TRACE (*)    Bacteria, UA RARE (*)    All other components within normal limits  RESP PANEL BY RT-PCR (FLU A&B, COVID) ARPGX2  LIPASE, BLOOD  CBC WITH DIFFERENTIAL/PLATELET  MAGNESIUM  CBG MONITORING, ED  TROPONIN I (HIGH SENSITIVITY)  TROPONIN I (HIGH SENSITIVITY)    EKG EKG Interpretation  Date/Time:  Friday July 22 2022 07:03:44 EDT Ventricular Rate:  63 PR Interval:  195 QRS Duration: 95 QT Interval:  423 QTC Calculation: 433 R Axis:   41 Text Interpretation: Sinus rhythm Anteroseptal infarct, age indeterminate No significant change since prior 9/23 Confirmed by Aletta Edouard [phone]) on 07/22/2022 7:09:03 AM  Radiology DG Chest Port 1 View  Result Date: 07/22/2022 CLINICAL DATA:  66 year old female with history of altered mental status. EXAM: PORTABLE CHEST 1 VIEW COMPARISON:  Chest x-ray 06/05/2022. FINDINGS: Bandlike opacity in the left upper lobe adjacent to the aortic arch, unchanged compared to prior examinations, corresponding to an area of chronic postradiation fibrosis based on comparison with prior chest CT 11/08/2017. Lung volumes are normal. No consolidative airspace disease. No pleural effusions. Diffuse  interstitial prominence and peribronchial cuffing, similar to prior studies, suggesting chronic bronchitis. No evidence of pulmonary edema. Heart size is normal. Upper mediastinal contours appear similar to prior studies. Left-sided pacemaker/AICD with lead tip projecting over the expected location of the right ventricle. IMPRESSION: 1. Diffuse interstitial prominence and peribronchial cuffing, similar to the prior examination, favored to reflect chronic bronchitis. No definite radiographic evidence of acute cardiopulmonary disease. Electronically Signed   By: Vinnie Langton M.D.   On: 07/22/2022 07:45    Procedures Procedures    Medications Ordered in ED Medications - No data to display  ED Course/ Medical Decision Making/ A&P Clinical Course as of 07/22/22 1706  Fri Jul 22, 2022  0738 Chest x-ray interpreted by me as AICD no acute infiltrates no pneumothorax.  Awaiting radiology reading. [MB]    Clinical Course User Index [MB] Hayden Rasmussen, MD                           Medical Decision Making Amount and/or Complexity of Data Reviewed Labs: ordered. Radiology: ordered.   This patient complains of possible lethargy; this involves an extensive number of treatment Options and is a complaint that carries with it a high risk of complications and morbidity. The differential includes dehydration, infection, metabolic derangement  I ordered, reviewed and interpreted labs, which included CBC normal chemistries fairly unremarkable LFTs normal troponin flat BNP mildly elevated valproic acid subtherapeutic urinalysis without signs of infection, COVID and flu negative I ordered imaging studies which included chest x-ray and I independently    visualized and interpreted imaging which showed no acute findings Additional history obtained from EMS Previous records obtained and reviewed in epic including recent admissions Cardiac monitoring reviewed, normal sinus rhythm Social determinants  considered, no significant barriers Critical Interventions: None  After the interventions stated above, I reevaluated the patient and found patient to be awake and alert without complaints Admission and further testing considered, no indications for admission or further work-up at this time.  Will return back to her facility where they can continue to observe her.         Final Clinical Impression(s) / ED Diagnoses Final diagnoses:  Encounter for medical screening examination    Rx / DC Orders ED Discharge Orders     None         Hayden Rasmussen, MD 07/22/22 1708

## 2022-07-22 NOTE — Discharge Instructions (Signed)
You are seen in the emergency department and because staff felt you were more tired than usual.  You had blood work chest x-ray EKG urinalysis that did not show an obvious explanation for your symptoms.  You had no complaints.  Please continue your regular medications and follow-up with your primary care doctor.  Return to the emergency department if any worsening or concerning symptoms

## 2022-07-22 NOTE — ED Triage Notes (Signed)
Pt brought in by EMS for AMS that started yesterday afternoon, per EMS staff said pt has been sleeping a lot. Pt is alert to self, current year and says that she is at hospital, pt denies pain or feeling bad, pt says staff woke her up to send here, pt states, "THey are idiots" Currently hospice pt.-this is documented on papers sent from facility.

## 2022-07-22 NOTE — ED Notes (Addendum)
Son, Colleen Fleming, updated at this time. Son states increased confusion over the past couple of weeks.

## 2022-08-22 ENCOUNTER — Ambulatory Visit (INDEPENDENT_AMBULATORY_CARE_PROVIDER_SITE_OTHER): Payer: Medicaid Other

## 2022-08-22 DIAGNOSIS — I5022 Chronic systolic (congestive) heart failure: Secondary | ICD-10-CM

## 2022-08-22 DIAGNOSIS — I255 Ischemic cardiomyopathy: Secondary | ICD-10-CM

## 2022-08-23 LAB — CUP PACEART REMOTE DEVICE CHECK
Battery Remaining Longevity: 127 mo
Battery Voltage: 3.04 V
Brady Statistic RV Percent Paced: 0.03 %
Date Time Interrogation Session: 20231120022823
HighPow Impedance: 60 Ohm
Implantable Lead Connection Status: 753985
Implantable Lead Implant Date: 20230217
Implantable Lead Location: 753860
Implantable Lead Model: 6935
Implantable Pulse Generator Implant Date: 20230217
Lead Channel Impedance Value: 399 Ohm
Lead Channel Impedance Value: 456 Ohm
Lead Channel Pacing Threshold Amplitude: 0.75 V
Lead Channel Pacing Threshold Pulse Width: 0.4 ms
Lead Channel Sensing Intrinsic Amplitude: 8.75 mV
Lead Channel Sensing Intrinsic Amplitude: 8.75 mV
Lead Channel Setting Pacing Amplitude: 2 V
Lead Channel Setting Pacing Pulse Width: 0.4 ms
Lead Channel Setting Sensing Sensitivity: 0.3 mV
Zone Setting Status: 755011
Zone Setting Status: 755011

## 2022-10-04 NOTE — Progress Notes (Signed)
Remote ICD transmission.   

## 2022-11-21 ENCOUNTER — Ambulatory Visit: Payer: Medicaid Other

## 2022-11-21 DIAGNOSIS — I255 Ischemic cardiomyopathy: Secondary | ICD-10-CM | POA: Diagnosis not present

## 2022-11-21 LAB — CUP PACEART REMOTE DEVICE CHECK
Battery Remaining Longevity: 125 mo
Battery Voltage: 3.03 V
Brady Statistic RV Percent Paced: 0.01 %
Date Time Interrogation Session: 20240219012304
HighPow Impedance: 80 Ohm
Implantable Lead Connection Status: 753985
Implantable Lead Implant Date: 20230217
Implantable Lead Location: 753860
Implantable Lead Model: 6935
Implantable Pulse Generator Implant Date: 20230217
Lead Channel Impedance Value: 342 Ohm
Lead Channel Impedance Value: 456 Ohm
Lead Channel Pacing Threshold Amplitude: 0.75 V
Lead Channel Pacing Threshold Pulse Width: 0.4 ms
Lead Channel Sensing Intrinsic Amplitude: 8.5 mV
Lead Channel Sensing Intrinsic Amplitude: 8.5 mV
Lead Channel Setting Pacing Amplitude: 2 V
Lead Channel Setting Pacing Pulse Width: 0.4 ms
Lead Channel Setting Sensing Sensitivity: 0.3 mV
Zone Setting Status: 755011
Zone Setting Status: 755011

## 2023-01-02 NOTE — Progress Notes (Signed)
Remote ICD transmission.   

## 2023-02-20 ENCOUNTER — Ambulatory Visit (INDEPENDENT_AMBULATORY_CARE_PROVIDER_SITE_OTHER): Payer: PRIVATE HEALTH INSURANCE

## 2023-02-20 DIAGNOSIS — I255 Ischemic cardiomyopathy: Secondary | ICD-10-CM

## 2023-02-20 DIAGNOSIS — I5022 Chronic systolic (congestive) heart failure: Secondary | ICD-10-CM

## 2023-02-20 LAB — CUP PACEART REMOTE DEVICE CHECK
Battery Remaining Longevity: 123 mo
Battery Voltage: 3.03 V
Brady Statistic RV Percent Paced: 0.01 %
Date Time Interrogation Session: 20240520043823
HighPow Impedance: 65 Ohm
Implantable Lead Connection Status: 753985
Implantable Lead Implant Date: 20230217
Implantable Lead Location: 753860
Implantable Lead Model: 6935
Implantable Pulse Generator Implant Date: 20230217
Lead Channel Impedance Value: 342 Ohm
Lead Channel Impedance Value: 418 Ohm
Lead Channel Pacing Threshold Amplitude: 0.625 V
Lead Channel Pacing Threshold Pulse Width: 0.4 ms
Lead Channel Sensing Intrinsic Amplitude: 7.75 mV
Lead Channel Sensing Intrinsic Amplitude: 7.75 mV
Lead Channel Setting Pacing Amplitude: 2 V
Lead Channel Setting Pacing Pulse Width: 0.4 ms
Lead Channel Setting Sensing Sensitivity: 0.3 mV
Zone Setting Status: 755011
Zone Setting Status: 755011

## 2023-03-17 NOTE — Progress Notes (Signed)
Remote ICD transmission.   

## 2023-05-22 ENCOUNTER — Ambulatory Visit (INDEPENDENT_AMBULATORY_CARE_PROVIDER_SITE_OTHER): Payer: Medicare Other

## 2023-05-22 DIAGNOSIS — I255 Ischemic cardiomyopathy: Secondary | ICD-10-CM

## 2023-05-22 DIAGNOSIS — I5022 Chronic systolic (congestive) heart failure: Secondary | ICD-10-CM

## 2023-05-22 LAB — CUP PACEART REMOTE DEVICE CHECK
Battery Remaining Longevity: 121 mo
Battery Voltage: 3.03 V
Brady Statistic RV Percent Paced: 0.01 %
Date Time Interrogation Session: 20240819012404
HighPow Impedance: 80 Ohm
Implantable Lead Connection Status: 753985
Implantable Lead Implant Date: 20230217
Implantable Lead Location: 753860
Implantable Lead Model: 6935
Implantable Pulse Generator Implant Date: 20230217
Lead Channel Impedance Value: 285 Ohm
Lead Channel Impedance Value: 399 Ohm
Lead Channel Pacing Threshold Amplitude: 0.875 V
Lead Channel Pacing Threshold Pulse Width: 0.4 ms
Lead Channel Sensing Intrinsic Amplitude: 7.625 mV
Lead Channel Sensing Intrinsic Amplitude: 7.625 mV
Lead Channel Setting Pacing Amplitude: 2 V
Lead Channel Setting Pacing Pulse Width: 0.4 ms
Lead Channel Setting Sensing Sensitivity: 0.3 mV
Zone Setting Status: 755011
Zone Setting Status: 755011

## 2023-06-01 NOTE — Progress Notes (Signed)
Remote ICD transmission.   

## 2023-08-21 ENCOUNTER — Ambulatory Visit: Payer: Medicaid Other

## 2023-10-18 NOTE — Progress Notes (Deleted)
Cardiology Office Note    Date:  10/18/2023  ID:  Joey, Kendricks [birth year], MRN [id number] PCP:  Irven Baltimore, NP  Cardiologist:  Bryan Lemma, MD  Electrophysiologist:  None   Chief Complaint: ***  History of Present Illness: .    Colleen Fleming is a 68 y.o. female with visit-pertinent history of COPD, lung cancer s/p chemo and radiation, heavy tobacco abuse, CAD, ICM, hyperlipidemia and obesity.  She was initially admitted on 01/30/2021 with acute anterior STEMI.  Emergent cardiac catheterization revealed 99% proximal LAD occlusion treated with DES, 80% distal LAD lesion treated medically, no other significant coronary artery disease.  Serial troponin was greater than 27,000.  Although LV gram on initial Showed EF 45 to 40%, follow-up echocardiogram revealed severely reduced LVEF of 25 to 30% with wall motion abnormality in anterior wall, small pericardial effusion.  Postprocedure she was placed on aspirin, Brilinta, high intensity statin and beta-blocker.  Her hospital course was complicated by hypotension requiring norepinephrine.  Repeat limited echocardiogram showed no obvious complication, RV was small, small pericardial fusion was noted.  She is felt to be dry and was treated with IV fluids.  After discharge she returned to the hospital in 02/10/2021 with diarrhea and weakness.  On arrival she was hypokalemic with potassium of 2.8, sodium was as low as 117.  She.  Hypocalcemia of 7.7.  COVID test came back positive.  She was started on IV fluid with hydration.  Midodrine was added to help elevate blood pressure.  Repeat echo on 02/13/2021 continue to show EF 20 to 25%, moderate pericardial effusion, no overt tamponade.  Right heart cath performed 02/15/2021 showed wedge pressure 4, RV pressure 17/2, cardiac output 3.0, cardiac index 2.4.  Low filling pressures was moderately reduced cardiac output was noted.  Patient was hydrated, heart failure titration was limited by low blood pressure  and volume depletion.  She was started on digoxin.  Patient had follow-up with heart failure clinic however was lost to follow-up.  She was last seen by Dr. Herbie Baltimore on 08/16/2021, her midodrine was reduced to 2.5 mg twice daily.  She was started on Toprol 12.5 mg daily with plans for close follow-up.  Her digoxin was discontinued because of history of electrolyte derangement.  Echocardiogram on 08/23/2021 indicated LVEF of 30 to 35%, LV with moderately decreased function, wall motion abnormalities noted, internal cavity was moderately dilated, she had G1 DD.  A small pericardial effusion was present, there is mild mitral valve regurgitation.  She was seen by Dr. Ladona Ridgel on 11/01/2021 for consideration of ICD insertion.  On 11/19/2021 she underwent ICD implant with Dr. Ladona Ridgel.  Today she presents for overdue follow up. She reports that she   Labwork independently reviewed: 10/06/2023: Sodium 136, potassium 4.5, creatinine 0.84, AST 17, ALT 10  ROS: .    Please see the history of present illness. Otherwise, review of systems is positive for ***.  All other systems are reviewed and otherwise negative.  Studies Reviewed: Marland Kitchen    EKG:  EKG is ordered today, personally reviewed, demonstrating ***  CV Studies: Cardiac studies reviewed are outlined and summarized above. Otherwise please see EMR for full report.   Current Reported Medications:.    No outpatient medications have been marked as taking for the 10/20/23 encounter (Appointment) with Rip Harbour, NP.    Physical Exam:    VS:  There were no vitals taken for this visit.   Wt Readings from Last 3 Encounters:  07/22/22 107 lb 5.8 oz (48.7 kg)  06/03/22 107 lb 5.8 oz (48.7 kg)  11/19/21 110 lb (49.9 kg)    GEN: Well nourished, well developed in no acute distress NECK: No JVD; No carotid bruits CARDIAC: ***RRR, no murmurs, rubs, gallops RESPIRATORY:  Clear to auscultation without rales, wheezing or rhonchi  ABDOMEN: Soft, non-tender,  non-distended EXTREMITIES:  No edema; No acute deformity   Asessement and Plan:.     ***     Disposition: F/u with ***  Signed, Rip Harbour, NP

## 2023-10-20 ENCOUNTER — Ambulatory Visit: Payer: Medicare Other | Admitting: Cardiology

## 2023-10-20 DIAGNOSIS — I255 Ischemic cardiomyopathy: Secondary | ICD-10-CM

## 2023-10-20 DIAGNOSIS — I2510 Atherosclerotic heart disease of native coronary artery without angina pectoris: Secondary | ICD-10-CM

## 2023-10-20 DIAGNOSIS — I5022 Chronic systolic (congestive) heart failure: Secondary | ICD-10-CM

## 2023-10-20 DIAGNOSIS — Z95810 Presence of automatic (implantable) cardiac defibrillator: Secondary | ICD-10-CM

## 2023-11-12 NOTE — Progress Notes (Deleted)
   Cardiology Office Note    Date:  11/12/2023  ID:  Colleen Fleming, Colleen Fleming [birth year], MRN [id number] PCP:  Irven Baltimore, NP  Cardiologist:  Bryan Lemma, MD  Electrophysiologist:  None   Chief Complaint: ***  History of Present Illness: .    Colleen Fleming is a 68 y.o. female with visit-pertinent history of COPD, lung cancer s/p chemo and radiation, heavy tobacco abuse, CAD, ICM, hyperlipidemia and obesity.  She was initially admitted on 01/30/2021 with acute anterior STEMI.  Emergent cardiac catheterization revealed 99% proximal LAD occlusion treated with DES, 80% distal LAD lesion treated medically, no other significant coronary artery disease.  Serial troponin was greater than 27,000.  Although LV gram on initial Showed EF 45 to 40%, follow-up echocardiogram revealed severely reduced LVEF of 25 to 30% with wall motion abnormality in anterior wall, small pericardial effusion.  Postprocedure she was placed on aspirin, Brilinta, high intensity statin and beta-blocker.   She was last seen by Dr. Herbie Baltimore on 08/16/2021, her midodrine was reduced to 2.5 mg twice daily.  She was started on Toprol 12.5 mg daily with plans for close follow-up.  Her digoxin was discontinued because of history of electrolyte derangement.  Echocardiogram on 08/23/2021 indicated LVEF of 30 to 35%, LV with moderately decreased function, wall motion abnormalities noted, internal cavity was moderately dilated, she had G1 DD.  A small pericardial effusion was present, there is mild mitral valve regurgitation.  She was seen by Dr. Ladona Ridgel on 11/01/2021 for consideration of ICD insertion.  On 11/19/2021 she underwent ICD implant with Dr. Ladona Ridgel.  Today she presents for overdue follow up. She reports that she   Labwork independently reviewed: 10/06/2023: Sodium 136, potassium 4.5, creatinine 0.84, AST 17, ALT 10  ROS: .    Please see the history of present illness. Otherwise, review of systems is positive for ***.  All other systems  are reviewed and otherwise negative.  Studies Reviewed: Marland Kitchen    EKG:  EKG is ordered today, personally reviewed, demonstrating ***  CV Studies: Cardiac studies reviewed are outlined and summarized above. Otherwise please see EMR for full report.   Current Reported Medications:.    No outpatient medications have been marked as taking for the 11/14/23 encounter (Appointment) with Jodelle Gross, NP.    Physical Exam:    VS:  There were no vitals taken for this visit.   Wt Readings from Last 3 Encounters:  07/22/22 107 lb 5.8 oz (48.7 kg)  06/03/22 107 lb 5.8 oz (48.7 kg)  11/19/21 110 lb (49.9 kg)    GEN: Well nourished, well developed in no acute distress NECK: No JVD; No carotid bruits CARDIAC: ***RRR, no murmurs, rubs, gallops RESPIRATORY:  Clear to auscultation without rales, wheezing or rhonchi  ABDOMEN: Soft, non-tender, non-distended EXTREMITIES:  No edema; No acute deformity   Asessement and Plan:.     ***     Disposition: F/u with ***  Signed, Joni Reining, NP

## 2023-11-14 ENCOUNTER — Ambulatory Visit: Payer: Medicare Other | Admitting: Adult Health

## 2023-11-19 NOTE — Progress Notes (Unsigned)
Cardiology Office Note    Date:  11/21/2023  ID:  Colleen Fleming, Colleen Fleming, Colleen Fleming, MRN [id number] PCP:  Burnis Medin, [name]  Cardiologist:  Bryan Lemma, MD   History of Present Illness: .    Colleen Fleming is a 68 y.o. female with visit-pertinent history of COPD, lung cancer s/p chemo and radiation, heavy tobacco abuse, CAD, ICM, hyperlipidemia and obesity.  She was initially admitted on 01/30/2021 with acute anterior STEMI.  Emergent cardiac catheterization revealed 99% proximal LAD occlusion treated with DES, 80% distal LAD lesion treated medically, no other significant coronary artery disease.  Serial troponin was greater than 27,000.  Although LV gram on initial Showed EF 45 to 40%, follow-up echocardiogram revealed severely reduced LVEF of 25 to 30% with wall motion abnormality in anterior wall, small pericardial effusion.  Postprocedure she was placed on aspirin, Brilinta, high intensity statin and beta-blocker.   She was last seen by Dr. Herbie Baltimore on 08/16/2021, her midodrine was reduced to 2.5 mg twice daily.  She was started on Toprol 12.5 mg daily with plans for close follow-up.  Her digoxin was discontinued because of history of electrolyte derangement.  Echocardiogram on 08/23/2021 indicated LVEF of 30 to 35%, LV with moderately decreased function, wall motion abnormalities noted, internal cavity was moderately dilated, she had G1 DD.  A small pericardial effusion was present, there is mild mitral valve regurgitation.  She was seen by Dr. Ladona Ridgel on 11/01/2021 for consideration of ICD insertion.  On 11/19/2021 she underwent ICD implant with Dr. Ladona Ridgel.  Today she presents for overdue follow up.  She has since been released from assisted living in [place] and her son has brought her home to live with him.  Her son is her main caretaker and manages all of her medications.  He is very thorough and on top of this.  He runs a [employer] and while he is away at work his other brother who  also lives with them cares for his mom.  She denies any chest pain, dizziness, nausea vomiting, there is some mild shortness of breath with movement from wheelchair to chairs or to bed.  Her son states that she has been having some wheezing.  She is being followed very closely by primary care who have adjusted her medications.  Several of her medications have been discontinued.  She is doing well on her current medication regimen.  Her son is very attentive.  She has a good appetite and is looking well.  Labwork independently reviewed: 10/06/2023: Sodium 136, potassium 4.5, creatinine 0.84, AST 17, ALT 10  ROS: .    All other systems are reviewed and otherwise negative.  Studies Reviewed: Marland Kitchen    EKG:  EKG is ordered today, personally reviewed, demonstrating normal sinus rhythm with evidence of septal infarct.  Low voltage QRS.  Heart rate of 65 bpm.  CV Studies: Cardiac studies reviewed are outlined and summarized above. Otherwise please see EMR for full report.  Physical Exam:    VS:  BP 98/62 (BP Location: Right Arm, Patient Position: Sitting, Cuff Size: Normal)   Pulse 65   Ht 4\' 10"  (1.473 m)   Wt 105 lb (47.6 kg)   SpO2 96%   BMI 21.95 kg/m    Wt Readings from Last 3 Encounters:  11/21/23 105 lb (47.6 kg)  07/22/22 107 lb 5.8 oz (48.7 kg)  Fleming/01/23 107 lb 5.8 oz (48.7 kg)    GEN: Well nourished, well developed in no acute distress,  frail sitting in a wheelchair.  Pale complexion NECK: No JVD; No carotid bruits CARDIAC: RRR, no murmurs, rubs, gallops RESPIRATORY: Bilateral bibasilar wheezes, no cough. ABDOMEN: Soft, non-tender, non-distended EXTREMITIES:  No edema; left arm mild contracture and stiffness.  Asessement and Plan:.    Coronary artery disease: Drug-eluting stent to the LAD from a 99% proximal stenosis.  Distal LAD lesion is treated medically.  Patient also is no longer taking Brilinta but is on aspirin daily.  The patient is no longer on digoxin due to electrolyte  derangement.  She will continue on metoprolol, lisinopril as directed.  Labs have been recently been completed by her primary care physician and are followed closely with frequent visits.  EKG is not showing anything acute or abnormal.  Will continue to follow her every 6 months for medical management.  2.  Hypercholesterolemia: She is no longer on statin therapy.  Primary care has ordered labs.  Okay to resume statin therapy to have LDL less than 70 if liver function is normal.  3.  Chronic systolic heart failure: Patient's most recent echocardiogram revealed LVEF of 30 to 35% with grade 1 diastolic dysfunction.  She has an ICD in situ and is followed by Dr. Ladona Ridgel.  She is not on GDMT at this time.  Blood pressure is low normal.  She is not on any diuretics, SGLT inhibitor, spironolactone, she does remain on lisinopril.  Would like to repeat her echocardiogram on follow-up in 6 months to evaluate her current status.  4.  COPD: Followed by primary care.  Continues to have some wheezing with minimal exertion and help with her son for repositioning.  They are considering inhalers.  He is to follow-up with them concerning this.  She is due to have follow-up labs and a chest x-ray on next office visit in a few days.     Disposition: F/u with Dr. Herbie Baltimore in 6 months with echocardiogram.  Signed, Joni Reining, NP

## 2023-11-20 ENCOUNTER — Ambulatory Visit (INDEPENDENT_AMBULATORY_CARE_PROVIDER_SITE_OTHER): Payer: Medicaid Other

## 2023-11-20 DIAGNOSIS — I255 Ischemic cardiomyopathy: Secondary | ICD-10-CM

## 2023-11-20 DIAGNOSIS — I5022 Chronic systolic (congestive) heart failure: Secondary | ICD-10-CM

## 2023-11-20 LAB — CUP PACEART REMOTE DEVICE CHECK
Battery Remaining Longevity: 116 mo
Battery Voltage: 3.03 V
Brady Statistic RV Percent Paced: 0.01 %
Date Time Interrogation Session: 20250217012501
HighPow Impedance: 85 Ohm
Implantable Lead Connection Status: 753985
Implantable Lead Implant Date: 20230217
Implantable Lead Location: 753860
Implantable Lead Model: 6935
Implantable Pulse Generator Implant Date: 20230217
Lead Channel Impedance Value: 342 Ohm
Lead Channel Impedance Value: 418 Ohm
Lead Channel Pacing Threshold Amplitude: 1 V
Lead Channel Pacing Threshold Pulse Width: 0.4 ms
Lead Channel Sensing Intrinsic Amplitude: 8 mV
Lead Channel Sensing Intrinsic Amplitude: 8 mV
Lead Channel Setting Pacing Amplitude: 2 V
Lead Channel Setting Pacing Pulse Width: 0.4 ms
Lead Channel Setting Sensing Sensitivity: 0.3 mV
Zone Setting Status: 755011
Zone Setting Status: 755011

## 2023-11-21 ENCOUNTER — Encounter: Payer: Self-pay | Admitting: Adult Health

## 2023-11-21 ENCOUNTER — Ambulatory Visit: Payer: Medicare Other | Attending: Adult Health | Admitting: Adult Health

## 2023-11-21 VITALS — BP 98/62 | HR 65 | Ht <= 58 in | Wt 105.0 lb

## 2023-11-21 DIAGNOSIS — I255 Ischemic cardiomyopathy: Secondary | ICD-10-CM | POA: Diagnosis not present

## 2023-11-21 DIAGNOSIS — I2510 Atherosclerotic heart disease of native coronary artery without angina pectoris: Secondary | ICD-10-CM

## 2023-11-21 DIAGNOSIS — I5022 Chronic systolic (congestive) heart failure: Secondary | ICD-10-CM | POA: Diagnosis present

## 2023-11-21 DIAGNOSIS — E7800 Pure hypercholesterolemia, unspecified: Secondary | ICD-10-CM | POA: Diagnosis present

## 2023-11-21 MED ORDER — MIDODRINE HCL 2.5 MG PO TABS: ORAL_TABLET | ORAL | 2 refills | Status: DC

## 2023-11-21 NOTE — Patient Instructions (Addendum)
Medication Instructions:  Your physician has recommended you make the following change in your medication:  STOP Digoxin  *If you need a refill on your cardiac medications before your next appointment, please call your pharmacy*   Lab Work: None ordered   Testing/Procedures: None ordered   Follow-Up: At [facility], you and your health needs are our priority.  As part of our continuing mission to provide you with exceptional heart care, we have created designated Provider Care Teams.  These Care Teams include your primary Cardiologist (physician) and Advanced Practice Providers (APPs -  Physician Assistants and Nurse Practitioners) who all work together to provide you with the care you need, when you need it.  We recommend signing up for the patient portal called "MyChart".  Sign up information is provided on this After Visit Summary.  MyChart is used to connect with patients for Virtual Visits (Telemedicine).  Patients are able to view lab/test results, encounter notes, upcoming appointments, etc.  Non-urgent messages can be sent to your provider as well.   To learn more about what you can do with MyChart, go to [email or web address].    Your next appointment:   6 month(s)  The format for your next appointment:   In Person  Provider:   Bryan Lemma, MD     Thank you for choosing [facility]!!   [phone]       1st Floor: - Lobby - Registration  - Pharmacy  - Lab - Cafe  2nd Floor: - PV Lab - Diagnostic Testing (echo, CT, nuclear med)  3rd Floor: - Vacant  4th Floor: - TCTS (cardiothoracic surgery) - AFib Clinic - Structural Heart Clinic - Vascular Surgery  - Vascular Ultrasound  5th Floor: - HeartCare Cardiology (general and EP) - Clinical Pharmacy for coumadin, hypertension, lipid, weight-loss medications, and med management appointments    Valet parking services will be available as well.

## 2023-12-04 ENCOUNTER — Other Ambulatory Visit: Payer: Self-pay | Admitting: Adult Health

## 2023-12-06 ENCOUNTER — Other Ambulatory Visit: Payer: Self-pay

## 2023-12-06 NOTE — Telephone Encounter (Signed)
 [name] pharmacy is requesting a refill on pt's medication brilinta. At last office visit with Joni Reining, NP it was stated that pt did not take brilinta anymore, because pt takes aspirin. Does pt suppose to be taking aspirin and brilinta? Please address before sending medications into pt's pharmacy

## 2023-12-11 ENCOUNTER — Telehealth: Payer: Self-pay | Admitting: Adult Health

## 2023-12-11 MED ORDER — MIDODRINE HCL 2.5 MG PO TABS: ORAL_TABLET | ORAL | 1 refills | Status: DC

## 2023-12-11 MED ORDER — MIDODRINE HCL 2.5 MG PO TABS: 2.5000 mg | ORAL_TABLET | Freq: Two times a day (BID) | ORAL | 3 refills | Status: DC

## 2023-12-11 MED ORDER — METOPROLOL SUCCINATE ER 25 MG PO TB24: 12.5000 mg | ORAL_TABLET | Freq: Every day | ORAL | 3 refills | Status: DC

## 2023-12-11 NOTE — Telephone Encounter (Signed)
 Patient identification verified by 2 forms. Marilynn Rail, RN    Called and spoke to patients son Dorien Chihuahua states:   -patient does not take: lasix, Brilinta, NTG or aspirin   -can resume aspirin if needed  Informed [name] message sent to Dr. Ileene Hutchinson has no questions or concerns at this time

## 2023-12-11 NOTE — Telephone Encounter (Signed)
 Dr. Elissa Hefty pt. These RX's have not been prescribed or refilled by [place]. Does Dr. Herbie Baltimore want to refill? Please advise.

## 2023-12-11 NOTE — Telephone Encounter (Signed)
 Called pt and pt's son confirmed that pt takes medication midodrine 2.5 mg only as needed if pt's systolic blood pressure is less than 105 mmhg. Rx corrected and was sent to pt's pharmacy as requested. Confirmation received.

## 2023-12-11 NOTE — Telephone Encounter (Signed)
*  STAT* If patient is at the pharmacy, call can be transferred to refill team.   1. Which medications need to be refilled? (please list name of each medication and dose if known)   midodrine (PROAMATINE) 2.5 MG tablet    DIFFERENT PHARMACY   4. Which pharmacy/location (including street and city if local pharmacy) is medication to be sent to? [facility] - [address], [place] - [address] Phone: [phone]  Fax: [phone]     5. Do they need a 30 day or 90 day supply? 90

## 2023-12-26 NOTE — Progress Notes (Signed)
 Remote ICD transmission.

## 2024-02-19 ENCOUNTER — Ambulatory Visit: Payer: Medicaid Other

## 2024-04-18 ENCOUNTER — Inpatient Hospital Stay (HOSPITAL_COMMUNITY)
Admission: EM | Admit: 2024-04-18 | Discharge: 2024-04-23 | DRG: 280 | Disposition: A | Discharge status: Skilled Nursing Facility | Attending: Cardiology | Admitting: Cardiology

## 2024-04-18 ENCOUNTER — Encounter (HOSPITAL_COMMUNITY): Payer: Self-pay

## 2024-04-18 ENCOUNTER — Other Ambulatory Visit: Payer: Self-pay

## 2024-04-18 ENCOUNTER — Emergency Department (HOSPITAL_COMMUNITY)

## 2024-04-18 ENCOUNTER — Encounter (HOSPITAL_COMMUNITY)
Admission: EM | Disposition: A | Discharge status: Skilled Nursing Facility | Payer: Self-pay | Source: Home / Self Care | Attending: Internal Medicine

## 2024-04-18 DIAGNOSIS — Z95810 Presence of automatic (implantable) cardiac defibrillator: Secondary | ICD-10-CM | POA: Diagnosis not present

## 2024-04-18 DIAGNOSIS — K219 Gastro-esophageal reflux disease without esophagitis: Secondary | ICD-10-CM | POA: Diagnosis present

## 2024-04-18 DIAGNOSIS — I5042 Chronic combined systolic (congestive) and diastolic (congestive) heart failure: Secondary | ICD-10-CM | POA: Diagnosis present

## 2024-04-18 DIAGNOSIS — I10 Essential (primary) hypertension: Secondary | ICD-10-CM | POA: Diagnosis present

## 2024-04-18 DIAGNOSIS — I2102 ST elevation (STEMI) myocardial infarction involving left anterior descending coronary artery: Secondary | ICD-10-CM | POA: Diagnosis present

## 2024-04-18 DIAGNOSIS — K449 Diaphragmatic hernia without obstruction or gangrene: Secondary | ICD-10-CM | POA: Diagnosis present

## 2024-04-18 DIAGNOSIS — I252 Old myocardial infarction: Secondary | ICD-10-CM

## 2024-04-18 DIAGNOSIS — G40909 Epilepsy, unspecified, not intractable, without status epilepticus: Secondary | ICD-10-CM | POA: Diagnosis present

## 2024-04-18 DIAGNOSIS — D62 Acute posthemorrhagic anemia: Secondary | ICD-10-CM | POA: Diagnosis present

## 2024-04-18 DIAGNOSIS — I213 ST elevation (STEMI) myocardial infarction of unspecified site: Secondary | ICD-10-CM

## 2024-04-18 DIAGNOSIS — F32A Depression, unspecified: Secondary | ICD-10-CM | POA: Diagnosis present

## 2024-04-18 DIAGNOSIS — Z515 Encounter for palliative care: Secondary | ICD-10-CM

## 2024-04-18 DIAGNOSIS — I959 Hypotension, unspecified: Secondary | ICD-10-CM | POA: Diagnosis not present

## 2024-04-18 DIAGNOSIS — Z7189 Other specified counseling: Secondary | ICD-10-CM | POA: Diagnosis not present

## 2024-04-18 DIAGNOSIS — I2510 Atherosclerotic heart disease of native coronary artery without angina pectoris: Secondary | ICD-10-CM | POA: Diagnosis present

## 2024-04-18 DIAGNOSIS — F411 Generalized anxiety disorder: Secondary | ICD-10-CM | POA: Diagnosis present

## 2024-04-18 DIAGNOSIS — I25110 Atherosclerotic heart disease of native coronary artery with unstable angina pectoris: Secondary | ICD-10-CM | POA: Diagnosis present

## 2024-04-18 DIAGNOSIS — Z66 Do not resuscitate: Secondary | ICD-10-CM | POA: Diagnosis not present

## 2024-04-18 DIAGNOSIS — R7989 Other specified abnormal findings of blood chemistry: Principal | ICD-10-CM

## 2024-04-18 DIAGNOSIS — R9431 Abnormal electrocardiogram [ECG] [EKG]: Secondary | ICD-10-CM | POA: Diagnosis present

## 2024-04-18 DIAGNOSIS — F17210 Nicotine dependence, cigarettes, uncomplicated: Secondary | ICD-10-CM | POA: Diagnosis present

## 2024-04-18 DIAGNOSIS — R627 Adult failure to thrive: Secondary | ICD-10-CM | POA: Diagnosis present

## 2024-04-18 DIAGNOSIS — E871 Hypo-osmolality and hyponatremia: Secondary | ICD-10-CM | POA: Diagnosis present

## 2024-04-18 DIAGNOSIS — D649 Anemia, unspecified: Secondary | ICD-10-CM | POA: Diagnosis not present

## 2024-04-18 DIAGNOSIS — E785 Hyperlipidemia, unspecified: Secondary | ICD-10-CM | POA: Diagnosis present

## 2024-04-18 DIAGNOSIS — I5022 Chronic systolic (congestive) heart failure: Secondary | ICD-10-CM | POA: Diagnosis present

## 2024-04-18 DIAGNOSIS — I255 Ischemic cardiomyopathy: Secondary | ICD-10-CM | POA: Diagnosis present

## 2024-04-18 DIAGNOSIS — K8020 Calculus of gallbladder without cholecystitis without obstruction: Secondary | ICD-10-CM | POA: Diagnosis present

## 2024-04-18 DIAGNOSIS — Z9221 Personal history of antineoplastic chemotherapy: Secondary | ICD-10-CM

## 2024-04-18 DIAGNOSIS — R7881 Bacteremia: Secondary | ICD-10-CM | POA: Diagnosis not present

## 2024-04-18 DIAGNOSIS — Z880 Allergy status to penicillin: Secondary | ICD-10-CM

## 2024-04-18 DIAGNOSIS — D509 Iron deficiency anemia, unspecified: Secondary | ICD-10-CM | POA: Diagnosis present

## 2024-04-18 DIAGNOSIS — K5731 Diverticulosis of large intestine without perforation or abscess with bleeding: Secondary | ICD-10-CM | POA: Diagnosis present

## 2024-04-18 DIAGNOSIS — Z923 Personal history of irradiation: Secondary | ICD-10-CM

## 2024-04-18 DIAGNOSIS — Z79899 Other long term (current) drug therapy: Secondary | ICD-10-CM

## 2024-04-18 DIAGNOSIS — E222 Syndrome of inappropriate secretion of antidiuretic hormone: Secondary | ICD-10-CM | POA: Diagnosis present

## 2024-04-18 DIAGNOSIS — I340 Nonrheumatic mitral (valve) insufficiency: Secondary | ICD-10-CM | POA: Diagnosis present

## 2024-04-18 DIAGNOSIS — Z955 Presence of coronary angioplasty implant and graft: Secondary | ICD-10-CM

## 2024-04-18 DIAGNOSIS — Z85118 Personal history of other malignant neoplasm of bronchus and lung: Secondary | ICD-10-CM

## 2024-04-18 DIAGNOSIS — J431 Panlobular emphysema: Secondary | ICD-10-CM | POA: Diagnosis present

## 2024-04-18 DIAGNOSIS — I2129 ST elevation (STEMI) myocardial infarction involving other sites: Secondary | ICD-10-CM | POA: Diagnosis not present

## 2024-04-18 LAB — APTT: aPTT: 24 s (ref 24–36)

## 2024-04-18 LAB — CBC WITH DIFFERENTIAL/PLATELET
Abs Immature Granulocytes: 0.08 K/uL — ABNORMAL HIGH (ref 0.00–0.07)
Basophils Absolute: 0 K/uL (ref 0.0–0.1)
Basophils Relative: 0 %
Eosinophils Absolute: 0 K/uL (ref 0.0–0.5)
Eosinophils Relative: 0 %
HCT: 28 % — ABNORMAL LOW (ref 36.0–46.0)
Hemoglobin: 9.1 g/dL — ABNORMAL LOW (ref 12.0–15.0)
Immature Granulocytes: 1 %
Lymphocytes Relative: 5 %
Lymphs Abs: 0.9 K/uL (ref 0.7–4.0)
MCH: 27.9 pg (ref 26.0–34.0)
MCHC: 32.5 g/dL (ref 30.0–36.0)
MCV: 85.9 fL (ref 80.0–100.0)
Monocytes Absolute: 1.9 K/uL — ABNORMAL HIGH (ref 0.1–1.0)
Monocytes Relative: 11 %
Neutro Abs: 14.8 K/uL — ABNORMAL HIGH (ref 1.7–7.7)
Neutrophils Relative %: 83 %
Platelets: 289 K/uL (ref 150–400)
RBC: 3.26 MIL/uL — ABNORMAL LOW (ref 3.87–5.11)
RDW: 15.4 % (ref 11.5–15.5)
WBC: 17.7 K/uL — ABNORMAL HIGH (ref 4.0–10.5)
nRBC: 0 % (ref 0.0–0.2)

## 2024-04-18 LAB — I-STAT CG4 LACTIC ACID, ED
Lactic Acid, Venous: 1.8 mmol/L (ref 0.5–1.9)
Lactic Acid, Venous: 1.5 mmol/L (ref 0.5–1.9)

## 2024-04-18 LAB — COMPREHENSIVE METABOLIC PANEL WITH GFR
ALT: 50 U/L — ABNORMAL HIGH (ref 0–44)
AST: 291 U/L — ABNORMAL HIGH (ref 15–41)
Albumin: 3.2 g/dL — ABNORMAL LOW (ref 3.5–5.0)
Alkaline Phosphatase: 88 U/L (ref 38–126)
Anion gap: 14 (ref 5–15)
BUN: 9 mg/dL (ref 8–23)
CO2: 21 mmol/L — ABNORMAL LOW (ref 22–32)
Calcium: 8.7 mg/dL — ABNORMAL LOW (ref 8.9–10.3)
Chloride: 92 mmol/L — ABNORMAL LOW (ref 98–111)
Creatinine, Ser: 0.76 mg/dL (ref 0.44–1.00)
GFR, Estimated: >60 mL/min (ref >60)
Glucose, Bld: 129 mg/dL — ABNORMAL HIGH (ref 70–99)
Potassium: 3.8 mmol/L (ref 3.5–5.1)
Sodium: 127 mmol/L — ABNORMAL LOW (ref 135–145)
Total Bilirubin: 0.5 mg/dL (ref 0.0–1.2)
Total Protein: 6.4 g/dL — ABNORMAL LOW (ref 6.5–8.1)

## 2024-04-18 LAB — LIPID PANEL
Cholesterol: 232 mg/dL — ABNORMAL HIGH (ref 0–200)
HDL: 51 mg/dL (ref >40)
LDL Cholesterol: 163 mg/dL — ABNORMAL HIGH (ref 0–99)
Total CHOL/HDL Ratio: 4.5 ratio
Triglycerides: 92 mg/dL (ref <150)
VLDL: 18 mg/dL (ref 0–40)

## 2024-04-18 LAB — I-STAT VENOUS BLOOD GAS, ED
Acid-Base Excess: 0 mmol/L (ref 0.0–2.0)
Bicarbonate: 22.5 mmol/L (ref 20.0–28.0)
Calcium, Ion: 1.06 mmol/L — ABNORMAL LOW (ref 1.15–1.40)
HCT: 28 % — ABNORMAL LOW (ref 36.0–46.0)
Hemoglobin: 9.5 g/dL — ABNORMAL LOW (ref 12.0–15.0)
O2 Saturation: 77 %
Potassium: 3.7 mmol/L (ref 3.5–5.1)
Sodium: 127 mmol/L — ABNORMAL LOW (ref 135–145)
TCO2: 23 mmol/L (ref 22–32)
pCO2, Ven: 30.1 mmHg — ABNORMAL LOW (ref 44–60)
pH, Ven: 7.482 — ABNORMAL HIGH (ref 7.25–7.43)
pO2, Ven: 38 mmHg (ref 32–45)

## 2024-04-18 LAB — LIPASE, BLOOD: Lipase: 21 U/L (ref 11–51)

## 2024-04-18 LAB — MAGNESIUM: Magnesium: 1.7 mg/dL (ref 1.7–2.4)

## 2024-04-18 LAB — RESP PANEL BY RT-PCR (RSV, FLU A&B, COVID)  RVPGX2
Influenza A by PCR: NEGATIVE
Influenza B by PCR: NEGATIVE
Resp Syncytial Virus by PCR: NEGATIVE
SARS Coronavirus 2 by RT PCR: NEGATIVE

## 2024-04-18 LAB — TROPONIN I (HIGH SENSITIVITY): Troponin I (High Sensitivity): >24000 ng/L (ref <18)

## 2024-04-18 LAB — PROTIME-INR
INR: 1.1 (ref 0.8–1.2)
Prothrombin Time: 14.8 s (ref 11.4–15.2)

## 2024-04-18 LAB — HEMOGLOBIN A1C
Hgb A1c MFr Bld: 5.2 % (ref 4.8–5.6)
Mean Plasma Glucose: 102.54 mg/dL

## 2024-04-18 LAB — AMMONIA: Ammonia: 16 umol/L (ref 9–35)

## 2024-04-18 SURGERY — CORONARY/GRAFT ACUTE MI REVASCULARIZATION
Anesthesia: LOCAL

## 2024-04-18 MED ORDER — ONDANSETRON HCL 4 MG/2ML IJ SOLN
4.0000 mg | Freq: Four times a day (QID) | INTRAMUSCULAR | Status: DC | PRN
  Administered 2024-04-22 – 2024-04-23 (×2): 4 mg via INTRAVENOUS
  Filled 2024-04-18 (×2): qty 2

## 2024-04-18 MED ORDER — PANCRELIPASE (LIP-PROT-AMYL) 12000-38000 UNITS PO CPEP
12000.0000 [IU] | ORAL_CAPSULE | Freq: Three times a day (TID) | ORAL | Status: DC
  Filled 2024-04-18 (×2): qty 1

## 2024-04-18 MED ORDER — ATORVASTATIN CALCIUM 80 MG PO TABS: 80.0000 mg | ORAL_TABLET | Freq: Every day | ORAL | Status: DC

## 2024-04-18 MED ORDER — VITAMIN D 25 MCG (1000 UNIT) PO TABS: 1000.0000 [IU] | ORAL_TABLET | Freq: Every day | ORAL | Status: DC

## 2024-04-18 MED ORDER — VERAPAMIL HCL 2.5 MG/ML IV SOLN
INTRAVENOUS | Status: AC
  Filled 2024-04-18: qty 2

## 2024-04-18 MED ORDER — OLANZAPINE 2.5 MG PO TABS: 2.5000 mg | ORAL_TABLET | Freq: Every day | ORAL | Status: DC | PRN

## 2024-04-18 MED ORDER — FAMOTIDINE 20 MG PO TABS
20.0000 mg | ORAL_TABLET | Freq: Every day | ORAL | Status: DC
  Filled 2024-04-18: qty 1

## 2024-04-18 MED ORDER — ACETAMINOPHEN 500 MG PO TABS: 500.0000 mg | ORAL_TABLET | Freq: Four times a day (QID) | ORAL | Status: DC | PRN

## 2024-04-18 MED ORDER — MAGNESIUM OXIDE -MG SUPPLEMENT 400 (240 MG) MG PO TABS: 400.0000 mg | ORAL_TABLET | Freq: Every day | ORAL | Status: DC

## 2024-04-18 MED ORDER — HEPARIN BOLUS VIA INFUSION
3000.0000 [IU] | Freq: Once | INTRAVENOUS | Status: AC
  Administered 2024-04-18: 3000 [IU] via INTRAVENOUS
  Filled 2024-04-18: qty 3000

## 2024-04-18 MED ORDER — LOPERAMIDE HCL 2 MG PO CAPS: 2.0000 mg | ORAL_CAPSULE | ORAL | Status: DC | PRN

## 2024-04-18 MED ORDER — LIDOCAINE HCL (PF) 1 % IJ SOLN
INTRAMUSCULAR | Status: AC
  Filled 2024-04-18: qty 30

## 2024-04-18 MED ORDER — HEPARIN (PORCINE) 25000 UT/250ML-% IV SOLN
800.0000 [IU]/h | INTRAVENOUS | Status: DC
  Administered 2024-04-18: 600 [IU]/h via INTRAVENOUS
  Filled 2024-04-18: qty 250

## 2024-04-18 MED ORDER — ASPIRIN 81 MG PO TBEC: 81.0000 mg | DELAYED_RELEASE_TABLET | Freq: Every day | ORAL | Status: DC

## 2024-04-18 MED ORDER — ASPIRIN 300 MG RE SUPP: 300.0000 mg | RECTAL | Status: DC

## 2024-04-18 MED ORDER — NITROGLYCERIN 0.4 MG SL SUBL: 0.4000 mg | SUBLINGUAL_TABLET | SUBLINGUAL | Status: DC | PRN

## 2024-04-18 MED ORDER — HEPARIN SODIUM (PORCINE) 1000 UNIT/ML IJ SOLN
INTRAMUSCULAR | Status: AC
  Filled 2024-04-18: qty 10

## 2024-04-18 MED ORDER — CALCIUM CARBONATE ANTACID 500 MG PO CHEW: 3.0000 | CHEWABLE_TABLET | Freq: Every day | ORAL | Status: DC | PRN

## 2024-04-18 MED ORDER — FLUOXETINE HCL 10 MG PO CAPS: 10.0000 mg | ORAL_CAPSULE | Freq: Every day | ORAL | Status: DC

## 2024-04-18 MED ORDER — ASPIRIN 81 MG PO CHEW: 324.0000 mg | CHEWABLE_TABLET | ORAL | Status: DC

## 2024-04-18 MED ORDER — SODIUM CHLORIDE 0.9 % IV SOLN: INTRAVENOUS | Status: AC

## 2024-04-18 MED ORDER — ACETAMINOPHEN 325 MG PO TABS
650.0000 mg | ORAL_TABLET | ORAL | Status: DC | PRN
Start: 2024-04-18 — End: 2024-04-18

## 2024-04-18 MED ORDER — LACTATED RINGERS IV BOLUS
250.0000 mL | Freq: Once | INTRAVENOUS | Status: AC
  Administered 2024-04-18: 250 mL via INTRAVENOUS

## 2024-04-18 MED ORDER — ASPIRIN 81 MG PO CHEW
324.0000 mg | CHEWABLE_TABLET | Freq: Once | ORAL | Status: AC
  Administered 2024-04-18: 324 mg via ORAL

## 2024-04-18 NOTE — ED Provider Notes (Signed)
 [place] [facility] Provider Note   CSN: [id number] Arrival date & time: 04/18/24  8177     Patient presents with: Abdominal Pain and Code STEMI   Colleen Fleming is a 68 y.o. 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  Patient is unable to provide details aside from having left-sided abdominal pain in the last few days.  She lives with her son who initially called EMS, per EMS her vitals were all within normal limits but EKG had a concerning elevations in leads I and aVL new from prior tracings, so code STEMI was activated.  Patient otherwise has reportedly been afebrile, no N/V, no active chest pain or shortness of breath.    Prior to Admission medications   Medication Sig Start Date End Date Taking? Authorizing Provider  dimenhyDRINATE (DRAMAMINE) 50 MG tablet Take 50 mg by mouth 2 (two) times daily as needed for nausea.   Yes [provider]  FLUoxetine  (PROZAC ) 20 MG capsule Take 20 mg by mouth daily. Take along with 10mg  capsule for a total daily dose of 30mg  04/04/24  Yes [provider]  lisinopril (ZESTRIL) 10 MG tablet Take 10 mg by mouth in the morning and at bedtime.   Yes [provider]  loperamide  (IMODIUM ) 2 MG capsule Take 2 mg by mouth as needed for diarrhea or loose stools.   Yes [provider]  metoprolol  succinate (TOPROL  XL) 25 MG 24 hr tablet Take 0.5 tablets (12.5 mg total) by mouth daily. Patient taking differently: Take 25 mg by mouth in the morning and at bedtime. 12/11/23  Yes Anner Alm ORN, MD  OLANZapine  (ZYPREXA ) 5 MG tablet Take 5 mg by mouth at bedtime.   Yes [provider]   PROZAC  10 MG capsule Take 10 mg by mouth daily. 01/31/22  Yes [provider]  acetaminophen  (TYLENOL ) 500 MG tablet Take 500 mg by mouth every 6 (six) hours as needed for mild pain. Patient not taking: Reported on 04/18/2024    [provider]  aspirin  EC 81 MG tablet Take 81 mg by mouth as needed. (0900) Swallow whole. Patient not taking: Reported on 04/18/2024    [provider]  atorvastatin  (LIPITOR ) 80 MG tablet Take 1 tablet (80 mg total) by mouth daily. Patient not taking: Reported on 04/18/2024 02/05/21   Marcine Catalan M, [name]  calcium  carbonate (TUMS EX) 750 MG chewable tablet Chew 2 tablets by mouth daily as needed for heartburn. Patient not taking: Reported on 04/18/2024    [provider]  Cholecalciferol (VITAMIN D3) 25 MCG (1000 UT) CAPS Take 1 capsule by mouth daily. Patient not taking: Reported on 11/21/2023 02/10/22   [provider]  famotidine  (PEPCID ) 20 MG tablet Take 1 tablet (20 mg total) by mouth at bedtime. Patient not taking: Reported on 11/21/2023 06/10/22   Pearlean Manus, MD  lipase/protease/amylase (CREON ) 12000-38000 units CPEP capsule Take 1 capsule (12,000 Units total) by mouth 3 (three) times daily before meals. Patient not taking: Reported on 04/18/2024 06/10/22   Pearlean Manus, MD  magnesium  oxide (MAG-OX) 400 MG tablet Take 400 mg by mouth daily. Patient not taking: Reported on 04/18/2024 11/06/23  [provider]    Allergies: Penicillins     Updated Vital Signs BP 124/81 (BP Location: Right Arm)   Pulse (!) 106   Temp 97.9 F (36.6 C) (Oral)   Resp 17   Ht 4' 10 (1.473 m)   Wt 48.3 kg   SpO2 98%   BMI 22.25 kg/m   Physical Exam Constitutional:      General: She is not in acute distress.    Appearance: She is not toxic-appearing or diaphoretic.  HENT:     Head: Normocephalic and atraumatic.     Nose: Nose normal. No rhinorrhea.     Mouth/Throat:     Mouth: Mucous membranes are moist.      Pharynx: Oropharynx is clear.  Eyes:     General: No scleral icterus.    Extraocular Movements: Extraocular movements intact.     Pupils: Pupils are equal, round, and reactive to light.  Cardiovascular:     Rate and Rhythm: Normal rate and regular rhythm.     Pulses:          Radial pulses are 2+ on the right side and 2+ on the left side.       Dorsalis pedis pulses are 1+ on the right side and 1+ on the left side.     Heart sounds: No murmur heard.    No gallop.  Pulmonary:     Effort: Pulmonary effort is normal. No respiratory distress.     Breath sounds: Normal breath sounds.  Abdominal:     General: Abdomen is flat.     Palpations: Abdomen is soft.     Tenderness: There is no abdominal tenderness. There is no right CVA tenderness, left CVA tenderness or guarding.  Musculoskeletal:        General: No deformity. Normal range of motion.     Cervical back: Normal range of motion and neck supple. No tenderness.     Right lower leg: No edema.     Left lower leg: No edema.  Skin:    General: Skin is warm and dry.     Capillary Refill: Capillary refill takes less than 2 seconds.     Coloration: Skin is pale (mild).  Neurological:     Mental Status: She is alert.     Sensory: No sensory deficit.     Motor: No weakness.     Comments: Oriented to self, disoriented to place and time though suspect part of this is from hearing difficulty     (all labs ordered are listed, but only abnormal results are displayed) Labs Reviewed  CBC WITH DIFFERENTIAL/PLATELET - Abnormal; Notable for the following components:      Result Value   WBC 17.7 (*)    RBC 3.26 (*)    Hemoglobin 9.1 (*)    HCT 28.0 (*)    Neutro Abs 14.8 (*)    Monocytes Absolute 1.9 (*)    Abs Immature Granulocytes 0.08 (*)    All other components within normal limits  COMPREHENSIVE METABOLIC PANEL WITH GFR - Abnormal; Notable for the following components:   Sodium 127 (*)    Chloride 92 (*)    CO2 21 (*)    Glucose,  Bld 129 (*)    Calcium  8.7 (*)    Total Protein 6.4 (*)    Albumin 3.2 (*)    AST 291 (*)    ALT 50 (*)    All other components within normal limits  LIPID PANEL - Abnormal;  Notable for the following components:   Cholesterol 232 (*)    LDL Cholesterol 163 (*)    All other components within normal limits  HEPARIN  LEVEL (UNFRACTIONATED) - Abnormal; Notable for the following components:   Heparin  Unfractionated 0.24 (*)    All other components within normal limits  CBC - Abnormal; Notable for the following components:   WBC 16.1 (*)    RBC 2.85 (*)    Hemoglobin 8.0 (*)    HCT 24.0 (*)    All other components within normal limits  RETICULOCYTES - Abnormal; Notable for the following components:   RBC. 2.85 (*)    Immature Retic Fract 30.3 (*)    All other components within normal limits  I-STAT VENOUS BLOOD GAS, ED - Abnormal; Notable for the following components:   pH, Ven 7.482 (*)    pCO2, Ven 30.1 (*)    Sodium 127 (*)    Calcium , Ion 1.06 (*)    HCT 28.0 (*)    Hemoglobin 9.5 (*)    All other components within normal limits  TROPONIN I (HIGH SENSITIVITY) - Abnormal; Notable for the following components:   Troponin I (High Sensitivity) >24,000 (*)    All other components within normal limits  RESP PANEL BY RT-PCR (RSV, FLU A&B, COVID)  RVPGX2  CULTURE, BLOOD (ROUTINE X 2)  CULTURE, BLOOD (ROUTINE X 2)  HEMOGLOBIN A1C  PROTIME-INR  APTT  AMMONIA  MAGNESIUM   LIPASE, BLOOD  HIV ANTIBODY (ROUTINE TESTING W REFLEX)  VITAMIN B12  FOLATE  LAB REPORT - SCANNED   Narrative:    Ordered by an unspecified provider.  URINALYSIS, W/ REFLEX TO CULTURE (INFECTION SUSPECTED)  LIPOPROTEIN A (LPA)  HEPARIN  LEVEL (UNFRACTIONATED)  OCCULT BLOOD X 1 CARD TO LAB, STOOL  IRON AND TIBC  FERRITIN  I-STAT CG4 LACTIC ACID, ED  I-STAT CG4 LACTIC ACID, ED  I-STAT CG4 LACTIC ACID, ED    EKG: EKG Interpretation Date/Time:  Thursday April 18 2024 18:38:38 EDT Ventricular Rate:  91 PR  Interval:  154 QRS Duration:  109 QT Interval:  427 QTC Calculation: 526 R Axis:   -56  Text Interpretation: Sinus rhythm LAD, consider left anterior fascicular block Anteroseptal infarct, age indeterminate Lateral leads are also involved Prolonged QT interval mild st elevation in lead I and avL with depression in II, III, and aVF not seen on prior Confirmed by Emil Share [phone]) on 04/18/2024 6:43:35 PM  Radiology: CT ABDOMEN PELVIS WO CONTRAST Result Date: 04/18/2024 CLINICAL DATA:  Abdominal pain EXAM: CT ABDOMEN AND PELVIS WITHOUT CONTRAST TECHNIQUE: Multidetector CT imaging of the abdomen and pelvis was performed following the standard protocol without IV contrast. RADIATION DOSE REDUCTION: This exam was performed according to the departmental dose-optimization program which includes automated exposure control, adjustment of the mA and/or kV according to patient size and/or use of iterative reconstruction technique. COMPARISON:  11/08/2017 FINDINGS: Lower chest: Lung bases are free of acute infiltrate or sizable effusion. Calcified granuloma is noted in the right base as well as in the right middle lobe. No definitive nodular changes are seen. Hepatobiliary: Dependent gallstones are noted within the gallbladder. The liver is within normal limits. Pancreas: Unremarkable. No pancreatic ductal dilatation or surrounding inflammatory changes. Spleen: Normal in size without focal abnormality. Adrenals/Urinary Tract: Adrenal glands are thickened worse on the left than the right but stable in appearance from the prior exam consistent with hyperplasia. No discrete nodule is noted. The kidneys are well visualized bilaterally. Renal cystic change is again  noted. No follow-up is recommended. No renal calculi or obstructive changes are seen. The bladder is well distended. Stomach/Bowel: Scattered diverticular change of the colon is noted without evidence of diverticulitis. The appendix is within normal limits.  Small bowel and stomach are unremarkable with the exception of a moderate-sized sliding-type hiatal hernia. Vascular/Lymphatic: Aortic atherosclerosis. No enlarged abdominal or pelvic lymph nodes. Reproductive: Uterus has been surgically removed. 3.4 cm simple appearing cyst is noted within the right adnexa. Other: No abdominal wall hernia or abnormality. No abdominopelvic ascites. Musculoskeletal: Chronic T12 and L1 compression deformities are noted. No acute bony abnormality is seen. IMPRESSION: Moderate-sized hiatal hernia. Cholelithiasis without complicating factors. Diverticulosis without diverticulitis. Right adnexal simple-appearing cyst measuring 3.4 cm. Recommend follow-up pelvic ultrasound in 6-12 months. Reference: JACR 2020 Feb;17(2):248-254 Electronically Signed   By: Oneil Devonshire M.D.   On: 04/18/2024 20:41   CT Head Wo Contrast Result Date: 04/18/2024 CLINICAL DATA:  Altered mental status EXAM: CT HEAD WITHOUT CONTRAST TECHNIQUE: Contiguous axial images were obtained from the base of the skull through the vertex without intravenous contrast. RADIATION DOSE REDUCTION: This exam was performed according to the departmental dose-optimization program which includes automated exposure control, adjustment of the mA and/or kV according to patient size and/or use of iterative reconstruction technique. COMPARISON:  02/20/2021 FINDINGS: Brain: No evidence of acute infarction, hemorrhage, hydrocephalus, extra-axial collection or mass lesion/mass effect. Chronic atrophic and ischemic changes are noted stable from the prior exam. Vascular: No hyperdense vessel or unexpected calcification. Skull: Normal. Negative for fracture or focal lesion. Sinuses/Orbits: No acute finding. Other: None IMPRESSION: Chronic atrophic and ischemic changes.  No acute abnormality noted. Electronically Signed   By: Oneil Devonshire M.D.   On: 04/18/2024 20:36   DG Chest Port 1 View Result Date: 04/18/2024 CLINICAL DATA:  Possible  sepsis EXAM: PORTABLE CHEST 1 VIEW COMPARISON:  08/15/2022 FINDINGS: Cardiac shadow is stable. Defibrillator is again noted. The lungs are well aerated bilaterally. No focal infiltrate or effusion is seen. Chronic scarring in the left suprahilar region is again noted consistent with prior therapy. IMPRESSION: No acute abnormality noted. Electronically Signed   By: Oneil Devonshire M.D.   On: 04/18/2024 19:37     Medications Ordered in the ED  0.9 %  sodium chloride  infusion ( Intravenous Infusion Verify 04/19/24 1200)  heparin  ADULT infusion 100 units/mL (25000 units/250mL) (700 Units/hr Intravenous Infusion Verify 04/19/24 1200)  nitroGLYCERIN  (NITROSTAT ) SL tablet 0.4 mg (has no administration in time range)  ondansetron  (ZOFRAN ) injection 4 mg (has no administration in time range)  loperamide  (IMODIUM ) capsule 2 mg (has no administration in time range)  OLANZapine  (ZYPREXA ) tablet 2.5 mg (has no administration in time range)  acetaminophen  (TYLENOL ) tablet 500 mg (has no administration in time range)  aspirin  EC tablet 81 mg (81 mg Oral Given 04/19/24 0940)  Oral care mouth rinse (has no administration in time range)  Chlorhexidine  Gluconate Cloth 2 % PADS 6 each (6 each Topical Given 04/19/24 0942)  FLUoxetine  (PROZAC ) capsule 30 mg (30 mg Oral Given 04/19/24 0945)  atorvastatin  (LIPITOR ) tablet 80 mg (80 mg Oral Given 04/19/24 0940)  calcium  carbonate (TUMS - dosed in mg elemental calcium ) chewable tablet 200 mg of elemental calcium  (has no administration in time range)  metoprolol  succinate (TOPROL -XL) 24 hr tablet 25 mg (25 mg Oral Given 04/19/24 0940)  losartan  (COZAAR ) tablet 12.5 mg (12.5 mg Oral Given 04/19/24 0941)  sodium chloride  flush (NS) 0.9 % injection 3 mL (3 mLs Intravenous Given 04/19/24  9057)  sodium chloride  flush (NS) 0.9 % injection 3 mL (has no administration in time range)  0.9 %  sodium chloride  infusion (has no administration in time range)  aspirin  chewable tablet 81 mg (81 mg  Oral Not Given 04/19/24 1056)  0.9 %  sodium chloride  infusion (has no administration in time range)  aspirin  chewable tablet 324 mg (324 mg Oral Given 04/18/24 1833)  lactated ringers  bolus 250 mL (0 mLs Intravenous Stopped 04/18/24 2044)  heparin  bolus via infusion 3,000 Units (3,000 Units Intravenous Bolus from Bag 04/18/24 2050)  heparin  bolus via infusion 700 Units (700 Units Intravenous Bolus from Bag 04/19/24 0451)  magnesium  sulfate IVPB 2 g 50 mL (0 g Intravenous Stopped 04/19/24 1023)  potassium chloride  10 mEq in 100 mL IVPB ( Intravenous Infusion Verify 04/19/24 1200)    Clinical Course as of 04/19/24 1242  Thu Apr 18, 2024  1906 I-Stat venous blood gas, ED ([facility])(!) Overall unremarkable findings [AD]  1944 Troponin I (High Sensitivity)(!!): >24,000 Cardiology re-engaged, they will re-evaluate patient [AD]  2002 CBC with Differential/Platelet(!) Leukocytosis to 17.7, Hb 9.1, plts wnl [AD]  2003 Lipase: 21 [AD]  2003 DG Chest Port 1 View No acute abnormality noted. [AD]  2107 CT ABDOMEN PELVIS WO CONTRAST Moderate-sized hiatal hernia. Cholelithiasis without complicating factors. Diverticulosis without diverticulitis. Right adnexal simple-appearing cyst measuring 3.4 cm. Recommend follow-up pelvic ultrasound in 6-12 months.   [AD]  2107 CT Head Wo Contrast Chronic atrophic and ischemic changes.  No acute abnormality noted. [AD]  2107 Sodium(!): 127 [AD]  2108 AST(!): 291 ALT 50, otherwise unremarkable CMP bedsides hyponatremia above [AD]  2108 Ammonia: 16 [AD]    Clinical Course User Index [AD] Raoul Rake, MD    Medical Decision Making Patient with the above history presented initially as a code STEMI due to concerns for new elevations in leads I and aVL, only recent complaints per patient have been left-sided abdominal pain and per her son she has been altered for a few days.  Patient denying chest pain or SOB, reportedly no nausea or vomiting recently.  Patient  otherwise is unable to give further detailed history suspect due to poor baseline hearing and AMS.  Cardiology evaluated patient at bedside shortly after arrival, stated no emergent need to take to Cath Lab at this moment.  Will initiate further sepsis workup given patient's overall presentation in addition to basic cardiac workup.  As above in ED course, patient's workup was remarkable for initial trop >24000. Cardiology was re-engaged and will plan to admit patient and likely take to cath lab in the following 24 hours. Heparin  was started in the ED. Otherwise, workup was also significant for cholelithiasis without cholecystitis, slightly elevated LFTs (AST>ALT), mild hyponatremia to 127, and leukocytosis to 17.7. Given patient's overall presentation and otherwise minimal signs of infection/sepsis, favor her leukocytosis to be reactive in setting of likely STEMI over the last few days given her symptom course. Antibiotics were not initiated in the ED for this reason. Also given her severely reduced EF (25-30%) and overall stable VS with no hypotension and not appearing clinically dry, full 30cc/kg bolus was not given and instead patient was given 250cc bolus in the ED.  Patient was admitted to cardiology in overall stable condition.  Amount and/or Complexity of Data Reviewed Labs: ordered. Decision-making details documented in ED Course. Radiology: ordered. Decision-making details documented in ED Course.  Risk OTC drugs. Prescription drug management. Decision regarding hospitalization.    Final diagnoses:  Troponin  level elevated  ST elevation myocardial infarction (STEMI), unspecified artery (HCC)        Raoul Rake, MD 04/19/24 1243    Emil Share, DO 04/19/24 1458

## 2024-04-18 NOTE — ED Notes (Signed)
 CCMD called.

## 2024-04-18 NOTE — ED Triage Notes (Signed)
 Pt bib [facility] for a stemi. Patient has had abdominal pain for 3 days and foul smelling uring PMX UTI PMX of stent and MI. Access tried for 3 times. A&O x2 self and place. Patient denies chest pain and SOB.   BP 110/70 96 P 96% RA RR30 CBG 132

## 2024-04-18 NOTE — Sepsis Progress Note (Signed)
 Elink monitoring for the code sepsis protocol.

## 2024-04-18 NOTE — H&P (Signed)
 Cardiology Admission History and Physical   Patient ID: LANDI BISCARDI MRN: [id number]; DOB: [birth year]   Admission date: 04/18/2024  PCP:  Colleen, Fleming  E, [name]   [facility] Providers Cardiologist:  Alm Clay, MD       Chief Complaint:  chest pain  Patient Profile: Colleen Fleming is a 68 y.o. female with a history of coronary artery disease, ischemic cardiomyopathy, single chamber MDT ICD in 11/2021, hyperlipidemia, obesity, lung cancer status postchemotherapy and radiation, and chronic obstructive pulmonary disease who is being seen 04/18/2024 for the evaluation of chest pain.  History of Present Illness: [name] endorsed 2 days of side pain that occurred suddenly.  Her pain did not improve over the course of the 2 days this prompted admission to the ER.  She cannot provide me with details regarding her abdominal pain.  Patient lives with her son, who was concerned.  He called EMS, he was noted to have an EKG concerning for a STEMI in the lateral leads.  He denies dizziness, syncope, orthopnea, shortness of breath.  Patient most recently had an acute anterior STEMI and 01/30/2021, which revealed 99% proximal LAD occlusion treated with a drug-eluting stent, as well as 80% distal LAD lesion that was medically treated.  Most recent echocardiogram in 08/23/2021 shows a reduced EF of 30 to 35%, normal RV function, mild mitral regurgitation. ICD last interrogated 11/2023. No ICD therapies since at least 05/2023.   ER, patient states she was not worsening side pain.  Notable labs included high-sensitivity troponin of 24,000, 1.5, creatinine 0.7.  He was evaluated by the interventional cardiologist, who did not think the patient needed to go to the Cath Lab at that time given she is not having symptoms  Past Medical History:  Diagnosis Date   Acute ST elevation myocardial infarction (STEMI) involving left anterior descending (LAD) coronary artery (HCC) 01/30/2021   99% thrombotic  subtotal occlusion of Prox LAD => (DES PCI LAD).  apical LAD occlusion with distal embolization.  EF estimated 40-45% w/ mid-apical anterior HK; ~ normal LVEDP with SBP 98 mmHg. => Echo EF 20 to 25% with entire anterior wall hypokinesis and apical akinesis.   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  GR 2 DD.   COPD (chronic obstructive pulmonary disease) (HCC)    Long-term smoker greater than 35 years 1 to 2 pack a day.   Coronary artery disease involving native coronary artery of native heart with unstable angina pectoris (HCC)    Presented with anterior STEMI-99% subtotal occluded proximal LAD-DES PCI (Synergy DES 2.5 mm 60 mm - 2.8 mm) distally apical thromboembolism.  Reduced EF with anterior and apical hypokinesis.   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  On midodrine  for blood pressure support.  Previously not able to tolerate beta-blocker or other CHF medications.   Ischemic cardiomyopathy 08/16/2021   EF 20 to 25% following anterior STEMI, despite LAD PCI.SABRA  Unable to titrate medications due to hypotension.   Obesity    Panlobular emphysema (HCC)    Small cell lung cancer, left upper lobe (HCC) 2014   T[facility] with radiation and chemotherapy (  [facility])   Past Surgical History:  Procedure Laterality Date   CORONARY/GRAFT ACUTE MI REVASCULARIZATION N/A 01/30/2021   Procedure: Coronary/Graft Acute MI Revascularization;  Surgeon: Anner Alm ORN, MD;  Location: [facility] INVASIVE CV LAB;  Service: Cardiovascular; Prox LAD 99% (-> 0%) DES PCI (Synergy DES 2.5 mm x 16 mm - 2.84mm) -> distal embolization with apical 80% occlusion   ICD  IMPLANT N/A 11/19/2021   Procedure: ICD IMPLANT;  Surgeon: Waddell Danelle ORN, MD;  Location: [facility] INVASIVE CV LAB;  Service: Cardiovascular;  Laterality: N/A;   LEFT HEART CATH AND CORONARY ANGIOGRAPHY N/A 01/30/2021   Procedure: LEFT HEART CATH AND CORONARY ANGIOGRAPHY;  Surgeon: Anner Alm ORN, MD;  Location: [facility] INVASIVE CV LAB;  Service: Cardiovascular; ANT STEMI: 99% thrombotic subtotal occlusion of Prox LAD => (DES PCI LAD). Otherwise angiographically normal coronaries with exception of apical LAD occlusion with distal embolization.  EF estimated 45% w/ mid-apical anterior HK; ~ nl LVEDP w/  SBP 98 mmHg.   RIGHT HEART CATH N/A 02/15/2021   Procedure: RIGHT HEART CATH;  Surgeon: Cherrie Toribio SAUNDERS, MD;  Location: [facility] INVASIVE CV LAB;  Service: CardiovRA = 2 mmHg; RV = 17/2 mmHg, PA = 17/4 (8) mmHg; PCW = 4 mmHg;   Ao sat = 98%, PA sat = 64%, 65%Fick Cardiac Output/Index = 3.0/2.4; Thermo CO/CI = 2.8/2.2; PVR = 1.3 [employer];   TRANSTHORACIC ECHOCARDIOGRAM  01/31/2021   (post Ant STEMI -PCI): EF 25-30%. Severe HK of entire Anterior/Anteroseptal wall & mild dyskinesis of anteroapical/apical segment. Gr 2 DD.  Normal RV size and function with mildly elevated.  RAP estimated 8 mm.  Small circumferential pericardial effusion.  Normal aortic and mitral valves.   TRANSTHORACIC ECHOCARDIOGRAM  02/01/2021   a) Limited: small-mod pericardial effusion primarily anterior to RV. IVC collapses ~50%. Nl RV & RAP. NO OVERT TAMPONADE; b) 5/14: Large, predominantly anterior pericardial effusion -> findings suggestive of early tamponade physiology, but no RV diastolic collapse and IVC not dilated.  RAP 3 mmHg. = EFFUSION LARGER, no TAMPONADE     Medications Prior to Admission: Prior to Admission medications   Medication Sig Start Date End Date Taking? Authorizing Provider  acetaminophen  (TYLENOL ) 500 MG tablet Take 500 mg by mouth every 6 (six) hours as needed for mild pain.    [provider]  aspirin  EC 81  MG tablet Take 81 mg by mouth as needed. (0900) Swallow whole.    [provider]  atorvastatin  (LIPITOR ) 80 MG tablet Take 1 tablet (80 mg total) by mouth daily. 02/05/21   Marcine Catalan M, [name]  calcium  carbonate (TUMS EX) 750 MG chewable tablet Chew 2 tablets by mouth daily as needed for heartburn.    [provider]  cephALEXin  (KEFLEX ) 500 MG capsule Take 1 capsule (500 mg total) by mouth 4 (four) times daily. Patient not taking: Reported on 11/21/2023 06/26/22   Patsey Lot, MD  Cholecalciferol (VITAMIN D3) 25 MCG (1000 UT) CAPS Take 1 capsule by mouth daily. Patient not taking: Reported on 11/21/2023 02/10/22   [provider]  DEPAKOTE  SPRINKLES 125 MG capsule Take 125 mg by mouth 2 (two) times daily. 01/31/22   [provider]  famotidine  (PEPCID ) 20 MG tablet Take 1 tablet (20 mg total) by mouth at bedtime. Patient not taking: Reported on 11/21/2023 06/10/22   Pearlean Manus, MD  lipase/protease/amylase (CREON ) 12000-38000 units CPEP capsule Take 1 capsule (12,000 Units total) by mouth 3 (three) times daily before meals. 06/10/22   Emokpae,  Courage, MD  lisinopril (ZESTRIL) 10 MG tablet Take 10 mg by mouth in the morning and at bedtime.    [provider]  loperamide  (IMODIUM ) 2 MG capsule Take 2 mg by mouth as needed for diarrhea or loose stools.    [provider]  magnesium  oxide (MAG-OX) 400 MG tablet Take 400 mg by mouth daily. 11/06/23   [provider]  metoprolol  succinate (TOPROL  XL) 25 MG 24 hr tablet Take 0.5 tablets (12.5 mg total) by mouth daily. 12/11/23   Anner Alm ORN, MD  midodrine  (PROAMATINE ) 2.5 MG tablet Take 1 tablet by mouth as needed. Only give if Systolic blood pressure is less than 105 mmhg 12/11/23   Jerilynn Lamarr HERO, NP  MYRBETRIQ 25 MG TB24 tablet Take 1 tablet by mouth daily. 10/06/23   [provider]  OLANZapine  (ZYPREXA ) 2.5 MG tablet Take 2.5 mg by mouth daily as needed  (agitation/paranoia).    [provider]  potassium chloride  (KLOR-CON ) 10 MEQ tablet Take 1 tablet (10 mEq total) by mouth as needed. Take While taking Lasix /furosemide  06/10/22   Pearlean Manus, MD  potassium chloride  SA (KLOR-CON  M) 20 MEQ tablet Take 1 tablet (20 mEq total) by mouth 2 (two) times daily. 06/26/22   Patsey Lot, MD  PROZAC  10 MG capsule Take 10 mg by mouth daily. 01/31/22   [provider]  sodium fluoride (PREVIDENT 5000 PLUS) 1.1 % CREA dental cream Place 1 application onto teeth every evening. (2100)    [provider]  thiamine  (VITAMIN B-1) 100 MG tablet Take 1 tablet (100 mg total) by mouth daily. 06/11/22   Pearlean Manus, MD     Allergies:    Allergies  Allergen Reactions   Penicillins Hives    Social History:   Social History   Socioeconomic History   Marital status: Divorced    Spouse name: Not on file   Number of children: 2   Years of education: Not on file   Highest education level: Not on file  Occupational History   Not on file  Tobacco Use   Smoking status: Every Day    Current packs/day: 0.50    Average packs/day: 0.5 packs/day for 40.0 years (20.0 ttl pk-yrs)    Types: Cigarettes   Smokeless tobacco: Never  Substance and Sexual Activity   Alcohol use: Not Currently   Drug use: Never   Sexual activity: Not on file  Other Topics Concern   Not on file  Social History Narrative   Currently divorced.  No longer working.  Previously worked in [employer].   Lives near [place].   Still smokes about 2 packs a day.   Social Drivers of Corporate investment banker Strain: Low Risk  (02/18/2021)   Overall Financial Resource Strain (CARDIA)    Difficulty of Paying Living Expenses: Not very hard  Food Insecurity: No Food Insecurity (02/18/2021)   Hunger Vital Sign    Worried About Running Out of Food in the Last Year: Never true    Ran Out of Food in the Last Year: Never true  Transportation Needs: No  Transportation Needs (02/18/2021)   PRAPARE - Administrator, Civil Service (Medical): No    Lack of Transportation (Non-Medical): No  Physical Activity: Not on file  Stress: Not on file  Social Connections: Not on file  Intimate Partner Violence: Not on file     Family History:   The patient's family history includes Cancer in  her paternal aunt and paternal grandmother.    ROS:  Please see the history of present illness.  All other ROS reviewed and negative.     Physical Exam/Data: Vitals:   04/18/24 1829 04/18/24 1844 04/18/24 1845 04/18/24 1900  BP:    124/80  Pulse:   96 96  Resp:  (!) 21 (!) 34 (!) 27  Temp:      TempSrc:      SpO2:   100% 100%  Weight: 49.9 kg     Height: 4' 10 (1.473 m)      No intake or output data in the 24 hours ending 04/18/24 2025    04/18/2024    6:29 PM 11/21/2023   11:22 AM 07/22/2022    7:06 AM  Last 3 Weights  Weight (lbs) 110 lb 105 lb 107 lb 5.8 oz  Weight (kg) 49.896 kg 47.628 kg 48.7 kg     Body mass index is 22.99 kg/m.  General:  responsive, intermittently answers questions HEENT: normal Neck: no JVD Vascular: No carotid bruits; Distal pulses 2+ bilaterally   Cardiac:  normal S1, S2; RRR; no murmur  Lungs:  clear to auscultation bilaterally, no wheezing, rhonchi or rales  Abd: soft, nontender, no hepatomegaly  Ext: no edema Musculoskeletal:  No deformities, BUE and BLE strength normal and equal Skin: warm and dry  Neuro:  CNs 2-12 intact, no focal abnormalities noted Psych:  Normal affect   EKG:  The ECG that was done in the ER was personally reviewed and demonstrates sinus rhythm  Relevant CV Studies: reviewed  Laboratory Data: High Sensitivity Troponin:   Recent Labs  Lab 04/18/24 1839  TROPONINIHS >24,000*      Chemistry Recent Labs  Lab 04/18/24 1839 04/18/24 1855  NA 127* 127*  K 3.8 3.7  CL 92*  --   CO2 21*  --   GLUCOSE 129*  --   BUN 9  --   CREATININE 0.76  --   CALCIUM  8.7*  --    MG 1.7  --   GFRNONAA >60  --   ANIONGAP 14  --     Recent Labs  Lab 04/18/24 1839  PROT 6.4*  ALBUMIN 3.2*  AST 291*  ALT 50*  ALKPHOS 88  BILITOT 0.5   Lipids  Recent Labs  Lab 04/18/24 1839  CHOL 232*  TRIG 92  HDL 51  LDLCALC 163*  CHOLHDL 4.5   Hematology Recent Labs  Lab 04/18/24 1839 04/18/24 1855  WBC 17.7*  --   RBC 3.26*  --   HGB 9.1* 9.5*  HCT 28.0* 28.0*  MCV 85.9  --   MCH 27.9  --   MCHC 32.5  --   RDW 15.4  --   PLT 289  --    Thyroid No results for input(s): TSH, FREET4 in the last 168 hours. BNPNo results for input(s): BNP, PROBNP in the last 168 hours.  DDimer No results for input(s): DDIMER in the last 168 hours.  Radiology/Studies:  DG Chest Port 1 View Result Date: 04/18/2024 CLINICAL DATA:  Possible sepsis EXAM: PORTABLE CHEST 1 VIEW COMPARISON:  08/15/2022 FINDINGS: Cardiac shadow is stable. Defibrillator is again noted. The lungs are well aerated bilaterally. No focal infiltrate or effusion is seen. Chronic scarring in the left suprahilar region is again noted consistent with prior therapy. IMPRESSION: No acute abnormality noted. Electronically Signed   By: Oneil Devonshire M.D.   On: 04/18/2024 19:37     Assessment and Plan:   #  STEMI Difficult to obtain accurate history, however patient presents with acute onset thoracic pain and is found to have acute myocardial injury.  ECG demonstrates Q waves in the lateral leads of unknown known duration.  She is asymptomatic at this time, but given her prior history of coronary artery disease, will need further evaluation.  Will contribute we will continue to treat with systemic heparin .  Received aspirin  in EMS.   - Aspirin  loaded - Continue IV systemic heparin    #ischemic cardiomyopathy #Single chamber ICD Euvolemic on exam, ICD previously interrogated in February, 2025.  She does have tacky therapies enabled and has not had an ICD shock since at least August  2024.  #hyperlipidemia -continue home statin  #lung cancer -NTD Risk Assessment/Risk Scores:   TIMI Risk Score for ST  Elevation MI:   The patient's TIMI risk score is  , which indicates a  % risk of all cause mortality at 30 days.      Code Status: Full Code  Severity of Illness: The appropriate patient status for this patient is INPATIENT. Inpatient status is judged to be reasonable and necessary in order to provide the required intensity of service to ensure the patient's safety. The patient's presenting symptoms, physical exam findings, and initial radiographic and laboratory data in the context of their chronic comorbidities is felt to place them at high risk for further clinical deterioration. Furthermore, it is not anticipated that the patient will be medically stable for discharge from the hospital within 2 midnights of admission.   * I certify that at the point of admission it is my clinical judgment that the patient will require inpatient hospital care spanning beyond 2 midnights from the point of admission due to high intensity of service, high risk for further deterioration and high frequency of surveillance required.*  For questions or updates, please contact [facility] Please consult [email or web address] for contact info under     Signed, Celena Lanius A Chalese Peach, MD  04/18/2024 8:25 PM

## 2024-04-18 NOTE — Progress Notes (Signed)
 PHARMACY - ANTICOAGULATION CONSULT NOTE  Pharmacy Consult for heparin  initiation Indication: chest pain/ACS  Allergies  Allergen Reactions   Penicillins Hives    Patient Measurements: Height: 4' 10 (147.3 cm) Weight: 49.9 kg (110 lb) IBW/kg (Calculated) : 40.9 HEPARIN  DW (KG): 49.9  Vital Signs: Temp: 98.4 F (36.9 C) (07/17 1827) Temp Source: Oral (07/17 1827) BP: 124/80 (07/17 1900) Pulse Rate: 96 (07/17 1900)  Labs: Recent Labs    04/18/24 1839 04/18/24 1855  HGB 9.1* 9.5*  HCT 28.0* 28.0*  PLT 289  --   APTT 24  --   LABPROT 14.8  --   INR 1.1  --   CREATININE 0.76  --   TROPONINIHS >24,000*  --     Estimated Creatinine Clearance: 48.6 mL/min (by C-G formula based on SCr of 0.76 mg/dL).   Medical History: Past Medical History:  Diagnosis Date   Acute ST elevation myocardial infarction (STEMI) involving left anterior descending (LAD) coronary artery (HCC) 01/30/2021   99% thrombotic subtotal occlusion of Prox LAD => (DES PCI LAD).  apical LAD occlusion with distal embolization.  EF estimated 40-45% w/ mid-apical anterior HK; ~ normal LVEDP with SBP 98 mmHg. => Echo EF 20 to 25% with entire anterior wall hypokinesis and apical akinesis.   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  GR 2 DD.   COPD (chronic obstructive pulmonary disease) (HCC)    Long-term smoker greater than 35 years 1 to 2 pack a day.   Coronary artery disease involving native coronary artery of native heart with unstable angina pectoris (HCC)    Presented with anterior STEMI-99% subtotal occluded proximal LAD-DES PCI (Synergy DES 2.5 mm 60 mm - 2.8 mm) distally apical thromboembolism.  Reduced EF with anterior and apical hypokinesis.   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  On midodrine  for blood pressure support.  Previously not able to tolerate beta-blocker or other CHF medications.   Ischemic cardiomyopathy 08/16/2021   EF 20 to 25% following anterior STEMI, despite LAD PCI.SABRA  Unable to titrate medications due to hypotension.   Obesity    Panlobular emphysema (HCC)    Small cell lung cancer, left upper lobe (HCC) 2014   Treated with radiation and chemotherapy ([facility])    Medications:  Home ASA, no anticoagulation  Assessment: 75 YOF presents with abnormal ECG. Hx of STEMI and LAD stent in 2022. No home anticoagulation. HGB 9.5, PLT 289. PT, INR, aPTT all WNL.   Goal of Therapy:  Heparin  level 0.3-0.7 units/ml Monitor platelets by anticoagulation protocol: Yes   Plan:  Give 3000 units bolus x 1 Start heparin  infusion at 600 units/hr Check anti-Xa level in 8 hours and daily while on heparin  Continue to monitor H&H and platelets    Belvie Macintosh, PharmD Candidate 04/18/2024,8:14 PM

## 2024-04-18 NOTE — Sepsis Progress Note (Signed)
 Notified provider via secure chat of need to order antibiotics.

## 2024-04-18 NOTE — Telephone Encounter (Addendum)
 Copied from CRM #[id number]. Topic: Clinical Concerns - High Priority Key Word >> Apr 18, 2024  3:46 PM Velma B wrote: Colleen Fleming is calling for clinical concerns (Ask: What symptoms are you calling about today, AND how long have you had these symptoms? Must Review HPKW list for symptoms) Document Name of Triage Nurse/BH Rep taking the call when applicable)   Include all details related to the request(s) below:  Patient's son Colleen Fleming was calling in- they had scheduled an appointment due to vomiting and pain in the patient's left side, diarrhea. They were called just now by one of the staff who had advised them to go to the ER due to the vomiting and a lack of fluid equipment here.   They were hoping to have the patient seen so she could address those issues and also get her admitted to skilled nursing. They would need a TB test or x-ray first, along with recent visit notes and labs.   I spoke to Arland- Dr. Teresa had stated that going to the ER would be best to help with dehydration, and that is what Arnulfo had advised earlier. [name] verbalized understanding and agreement.   Confirm and type the Best Contact Number below:  Patient/caller contact number:    [phone]         [] Home  [x] Mobile  [] Work [] Other   [x] Okay to leave a voicemail   Medication List:  Current Outpatient Medications:  .  budesonide (PULMICORT) 180 mcg/actuation inhaler, Inhale 1 puff 2 (two) times a day., Disp: 1 each, Rfl: 5 .  cholecalciferol (VITAMIN D3) 1,000 unit (25 mcg) tablet, Take 1 each (1,000 Units total) by mouth daily., Disp: 90 each, Rfl: 1 .  digoxin  (LANOXIN ) 62.5 mcg (0.0625 mg) tab tablet, Take 1 tablet (62.5 mcg total) by mouth daily., Disp: 15 tablet, Rfl: 0 .  FLUoxetine  (PROzac ) 10 mg capsule, TAKE 1 CAPSULE BY MOUTH EVERY DAY, Disp: 90 capsule, Rfl: 0 .  FLUoxetine  (PROzac ) 20 mg capsule, TAKE 1 CAPSULE BY MOUTH EVERY DAY, Disp: 90 capsule, Rfl: 0 .  lisinopriL (PRINIVIL) 10 mg tablet, TAKE 1 TABLET  BY MOUTH TWICE A DAY, Disp: 180 tablet, Rfl: 0 .  magnesium  oxide 400 mg magnesium  cap, Take 1 capsule (400 mg total) by mouth daily., Disp: 15 capsule, Rfl: 0 .  metoprolol  succinate (TOPROL  XL) 25 mg 24 hr tablet, TAKE 1 TABLET BY MOUTH TWICE A DAY, Disp: 180 tablet, Rfl: 0 .  Myrbetriq 25 mg Tb24, Take 1 tablet (25 mg total) by mouth daily., Disp: 90 tablet, Rfl: 0 .  OLANZapine  (ZyPREXA ) 5 mg tablet, TAKE 1 TABLET BY MOUTH EVERYDAY AT BEDTIME, Disp: 90 tablet, Rfl: 0 .  omeprazole (PriLOSEC) 40 mg DR capsule, TAKE 1 CAPSULE BY MOUTH EVERY DAY, Disp: 90 capsule, Rfl: 0     Medication Request/Refills: Pharmacy Information (if applicable)   [] Not Applicable       []  Pharmacy listed  Send Medication Request to:                                                 [] Pharmacy not listed (added to pharmacy list in Epic) Send Medication Request to:      Listed Pharmacies: [id number] - [place], [place] - [address] - PHONE: [phone] - FAX: [phone] [facility] - [address] [place] - [address] - PHONE: [phone] - FAX: [phone]

## 2024-04-18 NOTE — Progress Notes (Addendum)
 IC note:  Patient is a 68 year old female with a history of anterior ST elevation myocardial infarction status post PCI of the LAD in 2022, severe ischemic cardiomyopathy with ejection fraction of less than 30% status post ICD, hypertension, and hyperlipidemia who presents with failure to thrive.  The patient has had abdominal pain for 3 days.  The patient's repeat EKG demonstrates an old anterior septal infarction with isolated ST elevation in V2, aVL.  On query the patient denies any chest pain, shortness of breath, or abdominal pain.  I reviewed the patient's last echocardiogram which demonstrates anterior wall hypokinesis with ejection fraction of 30%.  She is hemodynamically stable, and having no ventricular arrhythmias.  She does not appear to be in cardiogenic shock.  She is conversant and comfortable.  Will defer emergency coronary angiography at this time.  Reviewed with ED staff.  Addendum 65 Called by ED as troponin > 24,000.  I reassessed the patient.  She is chest pain free.  Her son was there during my second visit and informed me that she had developed N/V about 3 days ago.  Remains asymptomatic.  Treat medically for now and will pursue angiography during his hospitalization.

## 2024-04-19 ENCOUNTER — Other Ambulatory Visit (HOSPITAL_COMMUNITY): Payer: Self-pay

## 2024-04-19 ENCOUNTER — Telehealth (HOSPITAL_COMMUNITY): Payer: Self-pay | Admitting: Pharmacy Technician

## 2024-04-19 ENCOUNTER — Encounter (HOSPITAL_COMMUNITY): Payer: Self-pay | Admitting: Internal Medicine

## 2024-04-19 ENCOUNTER — Encounter (HOSPITAL_COMMUNITY)
Admission: EM | Disposition: A | Discharge status: Skilled Nursing Facility | Payer: Self-pay | Source: Home / Self Care | Attending: Internal Medicine

## 2024-04-19 ENCOUNTER — Other Ambulatory Visit (HOSPITAL_COMMUNITY)

## 2024-04-19 DIAGNOSIS — R7881 Bacteremia: Secondary | ICD-10-CM | POA: Diagnosis not present

## 2024-04-19 DIAGNOSIS — D649 Anemia, unspecified: Secondary | ICD-10-CM

## 2024-04-19 DIAGNOSIS — D509 Iron deficiency anemia, unspecified: Secondary | ICD-10-CM | POA: Diagnosis not present

## 2024-04-19 DIAGNOSIS — I255 Ischemic cardiomyopathy: Secondary | ICD-10-CM

## 2024-04-19 DIAGNOSIS — I2510 Atherosclerotic heart disease of native coronary artery without angina pectoris: Secondary | ICD-10-CM

## 2024-04-19 DIAGNOSIS — I2102 ST elevation (STEMI) myocardial infarction involving left anterior descending coronary artery: Principal | ICD-10-CM

## 2024-04-19 HISTORY — PX: RIGHT/LEFT HEART CATH AND CORONARY ANGIOGRAPHY: CATH118266

## 2024-04-19 LAB — BLOOD CULTURE ID PANEL (REFLEXED) - BCID2

## 2024-04-19 LAB — CBC
HCT: 24 % — ABNORMAL LOW (ref 36.0–46.0)
Hemoglobin: 8 g/dL — ABNORMAL LOW (ref 12.0–15.0)
MCH: 28.1 pg (ref 26.0–34.0)
MCHC: 33.3 g/dL (ref 30.0–36.0)
MCV: 84.2 fL (ref 80.0–100.0)
Platelets: 245 K/uL (ref 150–400)
RBC: 2.85 MIL/uL — ABNORMAL LOW (ref 3.87–5.11)
RDW: 15.2 % (ref 11.5–15.5)
WBC: 16.1 K/uL — ABNORMAL HIGH (ref 4.0–10.5)
nRBC: 0 % (ref 0.0–0.2)

## 2024-04-19 LAB — IRON AND TIBC
Iron: 11 ug/dL — ABNORMAL LOW (ref 28–170)
Saturation Ratios: 3 % — ABNORMAL LOW (ref 10.4–31.8)
TIBC: 342 ug/dL (ref 250–450)
UIBC: 331 ug/dL

## 2024-04-19 LAB — HEPARIN LEVEL (UNFRACTIONATED)
Heparin Unfractionated: 0.24 [IU]/mL — ABNORMAL LOW (ref 0.30–0.70)
Heparin Unfractionated: <0.1 [IU]/mL — ABNORMAL LOW (ref 0.30–0.70)

## 2024-04-19 LAB — POCT I-STAT EG7
Acid-base deficit: 1 mmol/L (ref 0.0–2.0)
Bicarbonate: 22.7 mmol/L (ref 20.0–28.0)
Calcium, Ion: 1.14 mmol/L — ABNORMAL LOW (ref 1.15–1.40)
HCT: 21 % — ABNORMAL LOW (ref 36.0–46.0)
Hemoglobin: 7.1 g/dL — ABNORMAL LOW (ref 12.0–15.0)
O2 Saturation: 58 %
Potassium: 3.9 mmol/L (ref 3.5–5.1)
Sodium: 129 mmol/L — ABNORMAL LOW (ref 135–145)
TCO2: 24 mmol/L (ref 22–32)
pCO2, Ven: 33.6 mmHg — ABNORMAL LOW (ref 44–60)
pH, Ven: 7.439 — ABNORMAL HIGH (ref 7.25–7.43)
pO2, Ven: 29 mmHg — CL (ref 32–45)

## 2024-04-19 LAB — FERRITIN: Ferritin: 21 ng/mL (ref 11–307)

## 2024-04-19 LAB — VITAMIN B12: Vitamin B-12: 251 pg/mL (ref 180–914)

## 2024-04-19 LAB — POCT I-STAT 7, (LYTES, BLD GAS, ICA,H+H)
Acid-base deficit: 2 mmol/L (ref 0.0–2.0)
Bicarbonate: 20.9 mmol/L (ref 20.0–28.0)
Calcium, Ion: 1.1 mmol/L — ABNORMAL LOW (ref 1.15–1.40)
HCT: 21 % — ABNORMAL LOW (ref 36.0–46.0)
Hemoglobin: 7.1 g/dL — ABNORMAL LOW (ref 12.0–15.0)
O2 Saturation: 98 %
Potassium: 3.8 mmol/L (ref 3.5–5.1)
Sodium: 128 mmol/L — ABNORMAL LOW (ref 135–145)
TCO2: 22 mmol/L (ref 22–32)
pCO2 arterial: 28.1 mmHg — ABNORMAL LOW (ref 32–48)
pH, Arterial: 7.48 — ABNORMAL HIGH (ref 7.35–7.45)
pO2, Arterial: 103 mmHg (ref 83–108)

## 2024-04-19 LAB — RETICULOCYTES
Immature Retic Fract: 30.3 % — ABNORMAL HIGH (ref 2.3–15.9)
RBC.: 2.85 MIL/uL — ABNORMAL LOW (ref 3.87–5.11)
Retic Count, Absolute: 60.7 K/uL (ref 19.0–186.0)
Retic Ct Pct: 2.1 % (ref 0.4–3.1)

## 2024-04-19 LAB — FOLATE: Folate: 16.1 ng/mL (ref >5.9)

## 2024-04-19 LAB — HIV ANTIBODY (ROUTINE TESTING W REFLEX): HIV Screen 4th Generation wRfx: NONREACTIVE

## 2024-04-19 SURGERY — RIGHT/LEFT HEART CATH AND CORONARY ANGIOGRAPHY
Anesthesia: LOCAL

## 2024-04-19 MED ORDER — CHLORHEXIDINE GLUCONATE CLOTH 2 % EX PADS
6.0000 | MEDICATED_PAD | Freq: Every day | CUTANEOUS | Status: DC
  Administered 2024-04-19 – 2024-04-23 (×5): 6 via TOPICAL

## 2024-04-19 MED ORDER — CALCIUM CARBONATE ANTACID 500 MG PO CHEW: 1.0000 | CHEWABLE_TABLET | Freq: Every day | ORAL | Status: DC | PRN

## 2024-04-19 MED ORDER — ASPIRIN 81 MG PO TBEC
81.0000 mg | DELAYED_RELEASE_TABLET | Freq: Every day | ORAL | Status: DC
  Administered 2024-04-19 – 2024-04-23 (×5): 81 mg via ORAL
  Filled 2024-04-19 (×5): qty 1

## 2024-04-19 MED ORDER — SODIUM CHLORIDE 0.9 % IV SOLN: INTRAVENOUS | Status: DC

## 2024-04-19 MED ORDER — METOPROLOL SUCCINATE ER 25 MG PO TB24
25.0000 mg | ORAL_TABLET | Freq: Every day | ORAL | Status: DC
  Administered 2024-04-19 – 2024-04-20 (×2): 25 mg via ORAL
  Filled 2024-04-19 (×2): qty 1

## 2024-04-19 MED ORDER — SODIUM CHLORIDE 0.9 % IV SOLN
250.0000 mL | INTRAVENOUS | Status: DC | PRN
Start: 2024-04-19 — End: 2024-04-19

## 2024-04-19 MED ORDER — ATORVASTATIN CALCIUM 80 MG PO TABS: 80.0000 mg | ORAL_TABLET | Freq: Every day | ORAL | Status: DC

## 2024-04-19 MED ORDER — SODIUM CHLORIDE 0.9% FLUSH: 3.0000 mL | INTRAVENOUS | Status: DC | PRN

## 2024-04-19 MED ORDER — FAMOTIDINE 20 MG PO TABS
20.0000 mg | ORAL_TABLET | Freq: Every day | ORAL | Status: DC
Start: 2024-04-19 — End: 2024-04-19

## 2024-04-19 MED ORDER — HEPARIN SODIUM (PORCINE) 1000 UNIT/ML IJ SOLN
INTRAMUSCULAR | Status: AC
  Filled 2024-04-19: qty 10

## 2024-04-19 MED ORDER — FLUOXETINE HCL 20 MG PO CAPS
30.0000 mg | ORAL_CAPSULE | Freq: Every day | ORAL | Status: DC
  Administered 2024-04-19 – 2024-04-23 (×5): 30 mg via ORAL
  Filled 2024-04-19 (×5): qty 1

## 2024-04-19 MED ORDER — LOPERAMIDE HCL 2 MG PO CAPS
2.0000 mg | ORAL_CAPSULE | ORAL | Status: DC | PRN
  Administered 2024-04-22: 2 mg via ORAL
  Filled 2024-04-19: qty 1

## 2024-04-19 MED ORDER — SODIUM CHLORIDE 0.9% FLUSH
3.0000 mL | Freq: Two times a day (BID) | INTRAVENOUS | Status: DC
  Administered 2024-04-19 (×2): 3 mL via INTRAVENOUS

## 2024-04-19 MED ORDER — VITAMIN D 25 MCG (1000 UNIT) PO TABS: 1000.0000 [IU] | ORAL_TABLET | Freq: Every day | ORAL | Status: DC

## 2024-04-19 MED ORDER — OLANZAPINE 2.5 MG PO TABS: 2.5000 mg | ORAL_TABLET | Freq: Every day | ORAL | Status: DC | PRN

## 2024-04-19 MED ORDER — LIDOCAINE HCL (PF) 1 % IJ SOLN
INTRAMUSCULAR | Status: AC
  Filled 2024-04-19: qty 30

## 2024-04-19 MED ORDER — ORAL CARE MOUTH RINSE: 15.0000 mL | OROMUCOSAL | Status: DC | PRN

## 2024-04-19 MED ORDER — LIDOCAINE HCL (PF) 1 % IJ SOLN
INTRAMUSCULAR | Status: DC | PRN
  Administered 2024-04-19: 10 mL

## 2024-04-19 MED ORDER — HEPARIN (PORCINE) IN NACL 1000-0.9 UT/500ML-% IV SOLN
INTRAVENOUS | Status: DC | PRN
Start: 2024-04-19 — End: 2024-04-19
  Administered 2024-04-19: 1000 mL via SURGICAL_CAVITY

## 2024-04-19 MED ORDER — FLUOXETINE HCL 10 MG PO CAPS
10.0000 mg | ORAL_CAPSULE | Freq: Every day | ORAL | Status: DC
Start: 2024-04-19 — End: 2024-04-19
  Filled 2024-04-19: qty 1

## 2024-04-19 MED ORDER — ACETAMINOPHEN 500 MG PO TABS: 500.0000 mg | ORAL_TABLET | Freq: Four times a day (QID) | ORAL | Status: DC | PRN

## 2024-04-19 MED ORDER — IOHEXOL 350 MG/ML SOLN
INTRAVENOUS | Status: DC | PRN
Start: 2024-04-19 — End: 2024-04-19
  Administered 2024-04-19: 30 mL

## 2024-04-19 MED ORDER — MAGNESIUM SULFATE 2 GM/50ML IV SOLN
2.0000 g | Freq: Once | INTRAVENOUS | Status: AC
  Administered 2024-04-19: 2 g via INTRAVENOUS
  Filled 2024-04-19: qty 50

## 2024-04-19 MED ORDER — ATORVASTATIN CALCIUM 80 MG PO TABS
80.0000 mg | ORAL_TABLET | Freq: Every day | ORAL | Status: DC
  Administered 2024-04-19 – 2024-04-23 (×5): 80 mg via ORAL
  Filled 2024-04-19 (×5): qty 1

## 2024-04-19 MED ORDER — MAGNESIUM OXIDE -MG SUPPLEMENT 400 (240 MG) MG PO TABS
400.0000 mg | ORAL_TABLET | Freq: Every day | ORAL | Status: DC
Start: 2024-04-19 — End: 2024-04-19

## 2024-04-19 MED ORDER — HYDRALAZINE HCL 20 MG/ML IJ SOLN
10.0000 mg | INTRAMUSCULAR | Status: AC | PRN
Start: 2024-04-19 — End: 2024-04-19

## 2024-04-19 MED ORDER — VERAPAMIL HCL 2.5 MG/ML IV SOLN
INTRAVENOUS | Status: AC
  Filled 2024-04-19: qty 2

## 2024-04-19 MED ORDER — LOSARTAN POTASSIUM 25 MG PO TABS
12.5000 mg | ORAL_TABLET | Freq: Every day | ORAL | Status: DC
  Administered 2024-04-19 – 2024-04-20 (×2): 12.5 mg via ORAL
  Filled 2024-04-19 (×2): qty 1

## 2024-04-19 MED ORDER — POTASSIUM CHLORIDE 10 MEQ/100ML IV SOLN
10.0000 meq | INTRAVENOUS | Status: AC
  Administered 2024-04-19 (×2): 10 meq via INTRAVENOUS
  Filled 2024-04-19 (×2): qty 100

## 2024-04-19 MED ORDER — ASPIRIN 81 MG PO CHEW: 81.0000 mg | CHEWABLE_TABLET | Freq: Every day | ORAL | Status: DC

## 2024-04-19 MED ORDER — SODIUM CHLORIDE 0.9 % IV SOLN: 250.0000 mL | INTRAVENOUS | Status: AC | PRN

## 2024-04-19 MED ORDER — POTASSIUM CHLORIDE CRYS ER 20 MEQ PO TBCR: 40.0000 meq | EXTENDED_RELEASE_TABLET | Freq: Once | ORAL | Status: DC

## 2024-04-19 MED ORDER — HEPARIN BOLUS VIA INFUSION
700.0000 [IU] | Freq: Once | INTRAVENOUS | Status: AC
  Administered 2024-04-19: 700 [IU] via INTRAVENOUS
  Filled 2024-04-19: qty 700

## 2024-04-19 MED ORDER — SODIUM CHLORIDE 0.9% FLUSH
3.0000 mL | Freq: Two times a day (BID) | INTRAVENOUS | Status: DC
  Administered 2024-04-19: 3 mL via INTRAVENOUS

## 2024-04-19 MED ORDER — SODIUM CHLORIDE 0.9 % IV SOLN: INTRAVENOUS | Status: AC

## 2024-04-19 MED ORDER — ASPIRIN 81 MG PO CHEW: 81.0000 mg | CHEWABLE_TABLET | ORAL | Status: DC

## 2024-04-19 MED ORDER — SODIUM CHLORIDE 0.9 % IV BOLUS
500.0000 mL | Freq: Once | INTRAVENOUS | Status: AC
  Administered 2024-04-19: 500 mL via INTRAVENOUS

## 2024-04-19 MED ORDER — VANCOMYCIN HCL IN DEXTROSE 1-5 GM/200ML-% IV SOLN
1000.0000 mg | Freq: Once | INTRAVENOUS | Status: AC
  Administered 2024-04-19: 1000 mg via INTRAVENOUS
  Filled 2024-04-19: qty 200

## 2024-04-19 MED ORDER — VANCOMYCIN HCL 750 MG/150ML IV SOLN
750.0000 mg | INTRAVENOUS | Status: DC
  Administered 2024-04-20: 750 mg via INTRAVENOUS
  Filled 2024-04-19: qty 150

## 2024-04-19 MED ORDER — LABETALOL HCL 5 MG/ML IV SOLN: 10.0000 mg | INTRAVENOUS | Status: AC | PRN

## 2024-04-19 MED ORDER — CALCIUM CARBONATE ANTACID 500 MG PO CHEW: 3.0000 | CHEWABLE_TABLET | Freq: Every day | ORAL | Status: DC | PRN

## 2024-04-19 SURGICAL SUPPLY — 12 items
CATH 5FR JL3.5 JR4 ANG PIG MP (CATHETERS) IMPLANT
CATH SWAN GANZ 7F STRAIGHT (CATHETERS) IMPLANT
CLOSURE MYNX CONTROL 5F (Vascular Products) IMPLANT
GLIDESHEATH SLEND SS 6F .021 (SHEATH) IMPLANT
GUIDEWIRE INQWIRE 1.5J.035X260 (WIRE) IMPLANT
KIT MICROPUNCTURE NIT STIFF (SHEATH) IMPLANT
KIT SYRINGE INJ CVI SPIKEX1 (MISCELLANEOUS) IMPLANT
PACK CARDIAC CATHETERIZATION (CUSTOM PROCEDURE TRAY) ×1 IMPLANT
SET ATX-X65L (MISCELLANEOUS) IMPLANT
SHEATH PINNACLE 5F 10CM (SHEATH) IMPLANT
SHEATH PINNACLE 7F 10CM (SHEATH) IMPLANT
WIRE EMERALD 3MM-J .035X150CM (WIRE) IMPLANT

## 2024-04-19 NOTE — H&P (View-Only) (Signed)
 Rounding Note   Patient Name: Colleen Fleming Date of Encounter: 04/19/2024  [facility] Cardiologist: Alm Clay, MD   Subjective Patient feels ok today. Denies chest pain, dyspnea, N/V.   Scheduled Meds:  aspirin  EC  81 mg Oral Daily   atorvastatin   80 mg Oral Daily   Chlorhexidine  Gluconate Cloth  6 each Topical Daily   FLUoxetine   30 mg Oral Daily   potassium chloride   40 mEq Oral Once   Continuous Infusions:  sodium chloride  20 mL/hr at 04/19/24 0700   heparin  700 Units/hr (04/19/24 0700)   magnesium  sulfate bolus IVPB     PRN Meds: acetaminophen , calcium  carbonate, loperamide , nitroGLYCERIN , OLANZapine , ondansetron  (ZOFRAN ) IV, mouth rinse   Vital Signs  Vitals:   04/19/24 0400 04/19/24 0500 04/19/24 0600 04/19/24 0700  BP: 124/81 119/82 124/80 106/85  Pulse: 97 100 95 95  Resp: 19 (!) 27 (!) 33 (!) 24  Temp: 98.4 F (36.9 C)   98.2 F (36.8 C)  TempSrc: Oral   Oral  SpO2: 96% 96% 95% 92%  Weight:      Height:        Intake/Output Summary (Last 24 hours) at 04/19/2024 0808 Last data filed at 04/19/2024 0700 Gross per 24 hour  Intake 553.64 ml  Output --  Net 553.64 ml      04/19/2024   12:00 AM 04/18/2024    6:29 PM 11/21/2023   11:22 AM  Last 3 Weights  Weight (lbs) 106 lb 7.7 oz 110 lb 105 lb  Weight (kg) 48.3 kg 49.896 kg 47.628 kg      Telemetry NSR - Personally Reviewed  ECG  NSR. New mild ST elevation in AVL and V2 - Personally Reviewed  Physical Exam  GEN: No acute distress.   Neck: No JVD Cardiac: RRR, no murmurs, rubs, or gallops.  Respiratory: Clear to auscultation bilaterally. GI: Soft, nontender, non-distended  MS: No edema; No deformity. Neuro:  Nonfocal  Psych: Normal affect   Labs High Sensitivity Troponin:   Recent Labs  Lab 04/18/24 1839  TROPONINIHS >24,000*     Chemistry Recent Labs  Lab 04/18/24 1839 04/18/24 1855  NA 127* 127*  K 3.8 3.7  CL 92*  --   CO2 21*  --   GLUCOSE 129*  --   BUN 9   --   CREATININE 0.76  --   CALCIUM  8.7*  --   MG 1.7  --   PROT 6.4*  --   ALBUMIN 3.2*  --   AST 291*  --   ALT 50*  --   ALKPHOS 88  --   BILITOT 0.5  --   GFRNONAA >60  --   ANIONGAP 14  --     Lipids  Recent Labs  Lab 04/18/24 1839  CHOL 232*  TRIG 92  HDL 51  LDLCALC 163*  CHOLHDL 4.5    Hematology Recent Labs  Lab 04/18/24 1839 04/18/24 1855 04/19/24 0340  WBC 17.7*  --  16.1*  RBC 3.26*  --  2.85*  HGB 9.1* 9.5* 8.0*  HCT 28.0* 28.0* 24.0*  MCV 85.9  --  84.2  MCH 27.9  --  28.1  MCHC 32.5  --  33.3  RDW 15.4  --  15.2  PLT 289  --  245   Thyroid No results for input(s): TSH, FREET4 in the last 168 hours.  BNPNo results for input(s): BNP, PROBNP in the last 168 hours.  DDimer No results for input(s):  DDIMER in the last 168 hours.   Radiology  CT ABDOMEN PELVIS WO CONTRAST Result Date: 04/18/2024 CLINICAL DATA:  Abdominal pain EXAM: CT ABDOMEN AND PELVIS WITHOUT CONTRAST TECHNIQUE: Multidetector CT imaging of the abdomen and pelvis was performed following the standard protocol without IV contrast. RADIATION DOSE REDUCTION: This exam was performed according to the departmental dose-optimization program which includes automated exposure control, adjustment of the mA and/or kV according to patient size and/or use of iterative reconstruction technique. COMPARISON:  11/08/2017 FINDINGS: Lower chest: Lung bases are free of acute infiltrate or sizable effusion. Calcified granuloma is noted in the right base as well as in the right middle lobe. No definitive nodular changes are seen. Hepatobiliary: Dependent gallstones are noted within the gallbladder. The liver is within normal limits. Pancreas: Unremarkable. No pancreatic ductal dilatation or surrounding inflammatory changes. Spleen: Normal in size without focal abnormality. Adrenals/Urinary Tract: Adrenal glands are thickened worse on the left than the right but stable in appearance from the prior exam  consistent with hyperplasia. No discrete nodule is noted. The kidneys are well visualized bilaterally. Renal cystic change is again noted. No follow-up is recommended. No renal calculi or obstructive changes are seen. The bladder is well distended. Stomach/Bowel: Scattered diverticular change of the colon is noted without evidence of diverticulitis. The appendix is within normal limits. Small bowel and stomach are unremarkable with the exception of a moderate-sized sliding-type hiatal hernia. Vascular/Lymphatic: Aortic atherosclerosis. No enlarged abdominal or pelvic lymph nodes. Reproductive: Uterus has been surgically removed. 3.4 cm simple appearing cyst is noted within the right adnexa. Other: No abdominal wall hernia or abnormality. No abdominopelvic ascites. Musculoskeletal: Chronic T12 and L1 compression deformities are noted. No acute bony abnormality is seen. IMPRESSION: Moderate-sized hiatal hernia. Cholelithiasis without complicating factors. Diverticulosis without diverticulitis. Right adnexal simple-appearing cyst measuring 3.4 cm. Recommend follow-up pelvic ultrasound in 6-12 months. Reference: JACR 2020 Feb;17(2):248-254 Electronically Signed   By: Oneil Devonshire M.D.   On: 04/18/2024 20:41   CT Head Wo Contrast Result Date: 04/18/2024 CLINICAL DATA:  Altered mental status EXAM: CT HEAD WITHOUT CONTRAST TECHNIQUE: Contiguous axial images were obtained from the base of the skull through the vertex without intravenous contrast. RADIATION DOSE REDUCTION: This exam was performed according to the departmental dose-optimization program which includes automated exposure control, adjustment of the mA and/or kV according to patient size and/or use of iterative reconstruction technique. COMPARISON:  02/20/2021 FINDINGS: Brain: No evidence of acute infarction, hemorrhage, hydrocephalus, extra-axial collection or mass lesion/mass effect. Chronic atrophic and ischemic changes are noted stable from the prior exam.  Vascular: No hyperdense vessel or unexpected calcification. Skull: Normal. Negative for fracture or focal lesion. Sinuses/Orbits: No acute finding. Other: None IMPRESSION: Chronic atrophic and ischemic changes.  No acute abnormality noted. Electronically Signed   By: Oneil Devonshire M.D.   On: 04/18/2024 20:36   DG Chest Port 1 View Result Date: 04/18/2024 CLINICAL DATA:  Possible sepsis EXAM: PORTABLE CHEST 1 VIEW COMPARISON:  08/15/2022 FINDINGS: Cardiac shadow is stable. Defibrillator is again noted. The lungs are well aerated bilaterally. No focal infiltrate or effusion is seen. Chronic scarring in the left suprahilar region is again noted consistent with prior therapy. IMPRESSION: No acute abnormality noted. Electronically Signed   By: Oneil Devonshire M.D.   On: 04/18/2024 19:37    Cardiac Studies Echo 08/23/21: IMPRESSIONS     1. Distal anterior wall hypokinesis septal and apical akinesis . Left  ventricular ejection fraction, by estimation, is 30 to 35%. The  left  ventricle has moderately decreased function. The left ventricle  demonstrates regional wall motion abnormalities  (see scoring diagram/findings for description). The left ventricular  internal cavity size was moderately dilated. Left ventricular diastolic  parameters are consistent with Grade I diastolic dysfunction (impaired  relaxation).   2. Right ventricular systolic function is normal. The right ventricular  size is normal.   3. A small pericardial effusion is present. The pericardial effusion is  anterior to the right ventricle.   4. The mitral valve is abnormal. Mild mitral valve regurgitation. No  evidence of mitral stenosis.   5. The aortic valve is tricuspid. Aortic valve regurgitation is not  visualized. No aortic stenosis is present.   6. The inferior vena cava is normal in size with greater than 50%  respiratory variability, suggesting right atrial pressure of 3 mmHg.   Right heart cath 02/15/21:  RIGHT HEART CATH    Conclusion  Findings:   RA = 2 RV = 17/2 PA = 17/4 (8) PCW = 4 Fick cardiac output/index = 3.0/2.4 Thermo CO/CI = 2.8/2.2 PVR = 1.3 Ao sat = 98% PA sat = 64%, 65%   Cardiac cath 01/30/21:  Coronary/Graft Acute MI Revascularization  LEFT HEART CATH AND CORONARY ANGIOGRAPHY   Conclusion    The left ventricular ejection fraction is 45-50% by visual estimate. Mid to apical anterior hypokinesis. LV end diastolic pressure is mildly elevated at 13 mmHg, with systolic 98 mmHg. There is trivial (1+) mitral regurgitation. -------------------------------------------- Culprit lesion: Prox LAD lesion is 99% stenosed. (Calcified, thrombotic) A drug-eluting stent was successfully placed using a SYNERGY XD [id number] -> postdilated 2.8 mm Post intervention, there is a 0% residual stenosis. Dist LAD lesion is 80% stenosed -> there is TIMI 0 flow to the apex, post PCI TIMI II flow, but still notable irregularities distally, likely distal embolism. Otherwise normal coronaries   SUMMARY Single-vessel CAD with ostial and proximal LAD subtotal 99% thrombotic stenosis.  Distal LAD has TIMI I 0 flow. Successful DES PCI of the LAD using a synergy DES 2.5 mm x 16 mm postdilated to 2.8 mm. Otherwise angiographically normal coronary arteries with exception of the apical LAD. Mild mid to apical anterior hypokinesis on LV gram with mildly reduced EF of roughly 45%.  Upper limit of normal LVEDP. Borderline hypotension with systolic pressures ranging in the 95 to 105 mmHg range -> no evidence of shock Severe hypOkalemia, i-STAT potassium read is 2.1, labs from ER read is 2.7. Resolution of ventricular bigeminy.     RECOMMENDATION Admit to CVICU Will run 3 rounds of IV potassium and recheck in 3 hours. Continue IV Aggrastat  for another 1 hour post PCI. Check 2D echocardiogram, A1c and lipid panel the morning. High-dose high intensity statin Will attempt to start low-dose beta-blocker given tachycardia,  however with borderline pressures, may need to hold. Cardiac rehab consult Smoking cessation consult   Assessment: 1. Low filling pressures with moderately reduced cardiac output   Patient Profile   68 y.o. female with history of lung CA s/p chemo/RT, tobacco abuse, CAD s/p anterior STEMI in 2022 with stenting of proximal LAD, HLD,  ICM with EF 30-35% seen for evaluation of chest pain and N/V  Assessment & Plan  Anterior myocardial infarction. Patient with late presentation. Symptoms started Tuesday while at [facility] with family. Had N/V and left sided chest pain. EMS called but did not do Ecg at the time. Returned home. Had recurrent N/V and increased confusion so brought by son to  ED. Son reports left sided pain persisted last 3 days.  Ecg shows new ST elevation AVL and V2. Troponin > 24K. Suspect reocclusion of LAD. Will update Echo. Patient comfortable today. Recommend [facility] today to assess coronary anatomy and cardiac output. On ASA, high dose statin. Will add Toprol  XL. I expect low EF - will await results of cath to optimize CHF therapy. Elevated transaminases likely reflect recent infarct. Lactate level is normal. The procedure and risks were reviewed including but not limited to death, myocardial infarction, stroke, arrythmias, bleeding, transfusion, emergency surgery, dye allergy, or renal dysfunction. The patient voices understanding and is agreeable to proceed.  Ischemic CM reassess LV function. Will start low dose losartan , Toprol  Anemia. Heme check stool. No clear bleeding. ? Dilutional. Check iron studies/anemia panel History of lung CA. CXR OK COPD Disposition. Was in assisted living. Unable to meet needs so was staying at son's house. Looking for skilled nursing facility. Will get Social work involved.  Hyponatremia.  S/p ICD   For questions or updates, please contact [facility] Please consult [email or web address] for contact info under     Signed, Shanta Dorvil [place], MD   04/19/2024, 8:08 AM

## 2024-04-19 NOTE — Progress Notes (Addendum)
 PHARMACY - ANTICOAGULATION CONSULT NOTE  Pharmacy Consult for heparin  initiation Indication: chest pain/ACS  Allergies  Allergen Reactions   Penicillins Hives    Patient Measurements: Height: 4' 10 (147.3 cm) Weight: 48.3 kg (106 lb 7.7 oz) IBW/kg (Calculated) : 40.9 HEPARIN  DW (KG): 49.9  Vital Signs: Temp: 97.9 F (36.6 C) (07/18 1100) Temp Source: Oral (07/18 1100) BP: 119/83 (07/18 1300) Pulse Rate: 98 (07/18 1300)  Labs: Recent Labs    04/18/24 1839 04/18/24 1855 04/19/24 0340 04/19/24 1256  HGB 9.1* 9.5* 8.0*  --   HCT 28.0* 28.0* 24.0*  --   PLT 289  --  245  --   APTT 24  --   --   --   LABPROT 14.8  --   --   --   INR 1.1  --   --   --   HEPARINUNFRC  --   --  0.24* <0.10*  CREATININE 0.76  --   --   --   TROPONINIHS >24,000*  --   --   --     Estimated Creatinine Clearance: 44.7 mL/min (by C-G formula based on SCr of 0.76 mg/dL).  Assessment: 80 YOF presents with abnormal ECG. Hx of STEMI and LAD stent in 2022. No home anticoagulation. HGB 9.5, PLT 289. PT, INR, aPTT all WNL.   Heparin  level remains subtherapeutic (<0.1) on infusion at 700 units/hr. No s/sx of bleeding. IV line is very positional per RN. Plan for cardiac cath this afternoon.   Goal of Therapy:  Heparin  level 0.3-0.7 units/ml Monitor platelets by anticoagulation protocol: Yes   Plan:  Increase heparin  infusion to 800 units/hr F/u after cardiac cath  Monitor daily HL, CBC, and for s/sx of bleeding   Thank you for allowing pharmacy to participate in this patient's care,  Suzen Sour, PharmD, BCCCP Clinical Pharmacist  Phone: [phone] 04/19/2024 2:11 PM  Please check AMION for all [facility] phone numbers After 10:00 PM, call Main Pharmacy [phone]

## 2024-04-19 NOTE — Progress Notes (Addendum)
 Rounding Note   Patient Name: Colleen Fleming Date of Encounter: 04/19/2024  [facility] Cardiologist: Alm Clay, MD   Subjective Patient feels ok today. Denies chest pain, dyspnea, N/V.   Scheduled Meds:  aspirin  EC  81 mg Oral Daily   atorvastatin   80 mg Oral Daily   Chlorhexidine  Gluconate Cloth  6 each Topical Daily   FLUoxetine   30 mg Oral Daily   potassium chloride   40 mEq Oral Once   Continuous Infusions:  sodium chloride  20 mL/hr at 04/19/24 0700   heparin  700 Units/hr (04/19/24 0700)   magnesium  sulfate bolus IVPB     PRN Meds: acetaminophen , calcium  carbonate, loperamide , nitroGLYCERIN , OLANZapine , ondansetron  (ZOFRAN ) IV, mouth rinse   Vital Signs  Vitals:   04/19/24 0400 04/19/24 0500 04/19/24 0600 04/19/24 0700  BP: 124/81 119/82 124/80 106/85  Pulse: 97 100 95 95  Resp: 19 (!) 27 (!) 33 (!) 24  Temp: 98.4 F (36.9 C)   98.2 F (36.8 C)  TempSrc: Oral   Oral  SpO2: 96% 96% 95% 92%  Weight:      Height:        Intake/Output Summary (Last 24 hours) at 04/19/2024 0808 Last data filed at 04/19/2024 0700 Gross per 24 hour  Intake 553.64 ml  Output --  Net 553.64 ml      04/19/2024   12:00 AM 04/18/2024    6:29 PM 11/21/2023   11:22 AM  Last 3 Weights  Weight (lbs) 106 lb 7.7 oz 110 lb 105 lb  Weight (kg) 48.3 kg 49.896 kg 47.628 kg      Telemetry NSR - Personally Reviewed  ECG  NSR. New mild ST elevation in AVL and V2 - Personally Reviewed  Physical Exam  GEN: No acute distress.   Neck: No JVD Cardiac: RRR, no murmurs, rubs, or gallops.  Respiratory: Clear to auscultation bilaterally. GI: Soft, nontender, non-distended  MS: No edema; No deformity. Neuro:  Nonfocal  Psych: Normal affect   Labs High Sensitivity Troponin:   Recent Labs  Lab 04/18/24 1839  TROPONINIHS >24,000*     Chemistry Recent Labs  Lab 04/18/24 1839 04/18/24 1855  NA 127* 127*  K 3.8 3.7  CL 92*  --   CO2 21*  --   GLUCOSE 129*  --   BUN 9   --   CREATININE 0.76  --   CALCIUM  8.7*  --   MG 1.7  --   PROT 6.4*  --   ALBUMIN 3.2*  --   AST 291*  --   ALT 50*  --   ALKPHOS 88  --   BILITOT 0.5  --   GFRNONAA >60  --   ANIONGAP 14  --     Lipids  Recent Labs  Lab 04/18/24 1839  CHOL 232*  TRIG 92  HDL 51  LDLCALC 163*  CHOLHDL 4.5    Hematology Recent Labs  Lab 04/18/24 1839 04/18/24 1855 04/19/24 0340  WBC 17.7*  --  16.1*  RBC 3.26*  --  2.85*  HGB 9.1* 9.5* 8.0*  HCT 28.0* 28.0* 24.0*  MCV 85.9  --  84.2  MCH 27.9  --  28.1  MCHC 32.5  --  33.3  RDW 15.4  --  15.2  PLT 289  --  245   Thyroid No results for input(s): TSH, FREET4 in the last 168 hours.  BNPNo results for input(s): BNP, PROBNP in the last 168 hours.  DDimer No results for input(s):  DDIMER in the last 168 hours.   Radiology  CT ABDOMEN PELVIS WO CONTRAST Result Date: 04/18/2024 CLINICAL DATA:  Abdominal pain EXAM: CT ABDOMEN AND PELVIS WITHOUT CONTRAST TECHNIQUE: Multidetector CT imaging of the abdomen and pelvis was performed following the standard protocol without IV contrast. RADIATION DOSE REDUCTION: This exam was performed according to the departmental dose-optimization program which includes automated exposure control, adjustment of the mA and/or kV according to patient size and/or use of iterative reconstruction technique. COMPARISON:  11/08/2017 FINDINGS: Lower chest: Lung bases are free of acute infiltrate or sizable effusion. Calcified granuloma is noted in the right base as well as in the right middle lobe. No definitive nodular changes are seen. Hepatobiliary: Dependent gallstones are noted within the gallbladder. The liver is within normal limits. Pancreas: Unremarkable. No pancreatic ductal dilatation or surrounding inflammatory changes. Spleen: Normal in size without focal abnormality. Adrenals/Urinary Tract: Adrenal glands are thickened worse on the left than the right but stable in appearance from the prior exam  consistent with hyperplasia. No discrete nodule is noted. The kidneys are well visualized bilaterally. Renal cystic change is again noted. No follow-up is recommended. No renal calculi or obstructive changes are seen. The bladder is well distended. Stomach/Bowel: Scattered diverticular change of the colon is noted without evidence of diverticulitis. The appendix is within normal limits. Small bowel and stomach are unremarkable with the exception of a moderate-sized sliding-type hiatal hernia. Vascular/Lymphatic: Aortic atherosclerosis. No enlarged abdominal or pelvic lymph nodes. Reproductive: Uterus has been surgically removed. 3.4 cm simple appearing cyst is noted within the right adnexa. Other: No abdominal wall hernia or abnormality. No abdominopelvic ascites. Musculoskeletal: Chronic T12 and L1 compression deformities are noted. No acute bony abnormality is seen. IMPRESSION: Moderate-sized hiatal hernia. Cholelithiasis without complicating factors. Diverticulosis without diverticulitis. Right adnexal simple-appearing cyst measuring 3.4 cm. Recommend follow-up pelvic ultrasound in 6-12 months. Reference: JACR 2020 Feb;17(2):248-254 Electronically Signed   By: Oneil Devonshire M.D.   On: 04/18/2024 20:41   CT Head Wo Contrast Result Date: 04/18/2024 CLINICAL DATA:  Altered mental status EXAM: CT HEAD WITHOUT CONTRAST TECHNIQUE: Contiguous axial images were obtained from the base of the skull through the vertex without intravenous contrast. RADIATION DOSE REDUCTION: This exam was performed according to the departmental dose-optimization program which includes automated exposure control, adjustment of the mA and/or kV according to patient size and/or use of iterative reconstruction technique. COMPARISON:  02/20/2021 FINDINGS: Brain: No evidence of acute infarction, hemorrhage, hydrocephalus, extra-axial collection or mass lesion/mass effect. Chronic atrophic and ischemic changes are noted stable from the prior exam.  Vascular: No hyperdense vessel or unexpected calcification. Skull: Normal. Negative for fracture or focal lesion. Sinuses/Orbits: No acute finding. Other: None IMPRESSION: Chronic atrophic and ischemic changes.  No acute abnormality noted. Electronically Signed   By: Oneil Devonshire M.D.   On: 04/18/2024 20:36   DG Chest Port 1 View Result Date: 04/18/2024 CLINICAL DATA:  Possible sepsis EXAM: PORTABLE CHEST 1 VIEW COMPARISON:  08/15/2022 FINDINGS: Cardiac shadow is stable. Defibrillator is again noted. The lungs are well aerated bilaterally. No focal infiltrate or effusion is seen. Chronic scarring in the left suprahilar region is again noted consistent with prior therapy. IMPRESSION: No acute abnormality noted. Electronically Signed   By: Oneil Devonshire M.D.   On: 04/18/2024 19:37    Cardiac Studies Echo 08/23/21: IMPRESSIONS     1. Distal anterior wall hypokinesis septal and apical akinesis . Left  ventricular ejection fraction, by estimation, is 30 to 35%. The  left  ventricle has moderately decreased function. The left ventricle  demonstrates regional wall motion abnormalities  (see scoring diagram/findings for description). The left ventricular  internal cavity size was moderately dilated. Left ventricular diastolic  parameters are consistent with Grade I diastolic dysfunction (impaired  relaxation).   2. Right ventricular systolic function is normal. The right ventricular  size is normal.   3. A small pericardial effusion is present. The pericardial effusion is  anterior to the right ventricle.   4. The mitral valve is abnormal. Mild mitral valve regurgitation. No  evidence of mitral stenosis.   5. The aortic valve is tricuspid. Aortic valve regurgitation is not  visualized. No aortic stenosis is present.   6. The inferior vena cava is normal in size with greater than 50%  respiratory variability, suggesting right atrial pressure of 3 mmHg.   Right heart cath 02/15/21:  RIGHT HEART CATH    Conclusion  Findings:   RA = 2 RV = 17/2 PA = 17/4 (8) PCW = 4 Fick cardiac output/index = 3.0/2.4 Thermo CO/CI = 2.8/2.2 PVR = 1.3 Ao sat = 98% PA sat = 64%, 65%   Cardiac cath 01/30/21:  Coronary/Graft Acute MI Revascularization  LEFT HEART CATH AND CORONARY ANGIOGRAPHY   Conclusion    The left ventricular ejection fraction is 45-50% by visual estimate. Mid to apical anterior hypokinesis. LV end diastolic pressure is mildly elevated at 13 mmHg, with systolic 98 mmHg. There is trivial (1+) mitral regurgitation. -------------------------------------------- Culprit lesion: Prox LAD lesion is 99% stenosed. (Calcified, thrombotic) A drug-eluting stent was successfully placed using a SYNERGY XD [id number] -> postdilated 2.8 mm Post intervention, there is a 0% residual stenosis. Dist LAD lesion is 80% stenosed -> there is TIMI 0 flow to the apex, post PCI TIMI II flow, but still notable irregularities distally, likely distal embolism. Otherwise normal coronaries   SUMMARY Single-vessel CAD with ostial and proximal LAD subtotal 99% thrombotic stenosis.  Distal LAD has TIMI I 0 flow. Successful DES PCI of the LAD using a synergy DES 2.5 mm x 16 mm postdilated to 2.8 mm. Otherwise angiographically normal coronary arteries with exception of the apical LAD. Mild mid to apical anterior hypokinesis on LV gram with mildly reduced EF of roughly 45%.  Upper limit of normal LVEDP. Borderline hypotension with systolic pressures ranging in the 95 to 105 mmHg range -> no evidence of shock Severe hypOkalemia, i-STAT potassium read is 2.1, labs from ER read is 2.7. Resolution of ventricular bigeminy.     RECOMMENDATION Admit to CVICU Will run 3 rounds of IV potassium and recheck in 3 hours. Continue IV Aggrastat  for another 1 hour post PCI. Check 2D echocardiogram, A1c and lipid panel the morning. High-dose high intensity statin Will attempt to start low-dose beta-blocker given tachycardia,  however with borderline pressures, may need to hold. Cardiac rehab consult Smoking cessation consult   Assessment: 1. Low filling pressures with moderately reduced cardiac output   Patient Profile   68 y.o. female with history of lung CA s/p chemo/RT, tobacco abuse, CAD s/p anterior STEMI in 2022 with stenting of proximal LAD, HLD,  ICM with EF 30-35% seen for evaluation of chest pain and N/V  Assessment & Plan  Anterior myocardial infarction. Patient with late presentation. Symptoms started Tuesday while at [facility] with family. Had N/V and left sided chest pain. EMS called but did not do Ecg at the time. Returned home. Had recurrent N/V and increased confusion so brought by son to  ED. Son reports left sided pain persisted last 3 days.  Ecg shows new ST elevation AVL and V2. Troponin > 24K. Suspect reocclusion of LAD. Will update Echo. Patient comfortable today. Recommend [facility] today to assess coronary anatomy and cardiac output. On ASA, high dose statin. Will add Toprol  XL. I expect low EF - will await results of cath to optimize CHF therapy. Elevated transaminases likely reflect recent infarct. Lactate level is normal. The procedure and risks were reviewed including but not limited to death, myocardial infarction, stroke, arrythmias, bleeding, transfusion, emergency surgery, dye allergy, or renal dysfunction. The patient voices understanding and is agreeable to proceed.  Ischemic CM reassess LV function. Will start low dose losartan , Toprol  Anemia. Heme check stool. No clear bleeding. ? Dilutional. Check iron studies/anemia panel History of lung CA. CXR OK COPD Disposition. Was in assisted living. Unable to meet needs so was staying at son's house. Looking for skilled nursing facility. Will get Social work involved.  Hyponatremia.  S/p ICD   For questions or updates, please contact [facility] Please consult [email or web address] for contact info under     Signed, Shanta Dorvil [place], MD   04/19/2024, 8:08 AM

## 2024-04-19 NOTE — Progress Notes (Signed)
 Pharmacy Antibiotic Note  Colleen Fleming is a 68 y.o. female with blood cultures showing GPC in 2/4 bottles  Pharmacy has been consulted for vancomycin  dosing.  -WBC- 16.1, afebrile, SCr 0.76 -cultures with possible contaminant  Plan: -Vancomycin  1000mg  IV x1 followed by 750mg  IV q24h (estimated AUC= 523) -Will follow renal function, cultures and clinical progress    Height: 4' 10 (147.3 cm) Weight: 48.3 kg (106 lb 7.7 oz) IBW/kg (Calculated) : 40.9  Temp (24hrs), Avg:98.3 F (36.8 C), Min:97.9 F (36.6 C), Max:98.5 F (36.9 C)  Recent Labs  Lab 04/18/24 1839 04/18/24 1855 04/18/24 2004 04/19/24 0340  WBC 17.7*  --   --  16.1*  CREATININE 0.76  --   --   --   LATICACIDVEN  --  1.8 1.5  --     Estimated Creatinine Clearance: 44.7 mL/min (by C-G formula based on SCr of 0.76 mg/dL).    Allergies  Allergen Reactions   Penicillins Hives    Antimicrobials this admission: 7/18 Vanc  Dose adjustments this admission:   Microbiology results: 7/18 blood x2- GPC, staph species 7/18 blood x2- ngtd  Thank you for allowing pharmacy to be a part of this patient's care.  Prentice Poisson, PharmD Clinical Pharmacist **Pharmacist phone directory can now be found on [email or web address] (PW [email or web address]).  Listed under [facility].

## 2024-04-19 NOTE — Progress Notes (Signed)
 PHARMACY - ANTICOAGULATION CONSULT NOTE  Pharmacy Consult for heparin  initiation Indication: chest pain/ACS  Allergies  Allergen Reactions   Penicillins Hives    Patient Measurements: Height: 4' 10 (147.3 cm) Weight: 48.3 kg (106 lb 7.7 oz) IBW/kg (Calculated) : 40.9 HEPARIN  DW (KG): 49.9  Vital Signs: Temp: 98.4 F (36.9 C) (07/18 0400) Temp Source: Oral (07/18 0400) BP: 115/78 (07/18 0010) Pulse Rate: 94 (07/18 0010)  Labs: Recent Labs    04/18/24 1839 04/18/24 1855 04/19/24 0340  HGB 9.1* 9.5* 8.0*  HCT 28.0* 28.0* 24.0*  PLT 289  --  245  APTT 24  --   --   LABPROT 14.8  --   --   INR 1.1  --   --   HEPARINUNFRC  --   --  0.24*  CREATININE 0.76  --   --   TROPONINIHS >24,000*  --   --     Estimated Creatinine Clearance: 44.7 mL/min (by C-G formula based on SCr of 0.76 mg/dL).  Assessment: 28 YOF presents with abnormal ECG. Hx of STEMI and LAD stent in 2022. No home anticoagulation. HGB 9.5, PLT 289. PT, INR, aPTT all WNL.   Heparin  level subtherapeutic (0.24) on infusion at 600 units/hr. No issues with line or bleeding reported per RN.  Goal of Therapy:  Heparin  level 0.3-0.7 units/ml Monitor platelets by anticoagulation protocol: Yes   Plan:  Rebolus heparin  700 units Increase heparin  infusion to 700 units/hr F/u 8 hr heparin  level  Vito Ralph, PharmD, BCPS Please see amion for complete clinical pharmacist phone list 04/19/2024,4:43 AM

## 2024-04-19 NOTE — Telephone Encounter (Signed)
 Pharmacy Patient Advocate Encounter  Insurance verification completed.    The patient is insured through [facility] MEDICARE. Patient has Medicare and is not eligible for a copay card, but may be able to apply for patient assistance or Medicare RX Payment Plan (Patient Must reach out to their plan, if eligible for payment plan), if available.    Ran test claim for Brilinta  90mg  tablets and the current 30 day co-pay is $1.60.  Ran test claim for Farxiga  10mg  tablets and the current 30 day co-pay is $4.80  Ran test claim for Jardiance 10mg  tablets and the current 30 day co-pay is $4.80  Ran test claim for Entresto 24-26mg  tablets and the current 30 day co-pay is $4.80   This test claim was processed through [employer]- copay amounts may vary at other pharmacies due to [employer], or as the patient moves through the different stages of their insurance plan.

## 2024-04-19 NOTE — Progress Notes (Signed)
 Staff called to report patient with low BP post cath 80s systolic, MAP >108mmHg. She is without any symptoms. She has no fever. Will provide 500ml NS bolus, suspect sedation medication induced. Repeat BP in an hour.

## 2024-04-19 NOTE — Progress Notes (Signed)
 PHARMACY - PHYSICIAN COMMUNICATION CRITICAL VALUE ALERT - BLOOD CULTURE IDENTIFICATION (BCID)  Colleen Fleming is an 68 y.o. female who presented to [facility] on 04/18/2024 with STEMI  Assessment: Blood cultures with GPC in 2/4 bottles. BCID with staph species, no resistance  Name of physician (or Provider) Contacted: Darryle Currier  Current antibiotics: none  Changes to prescribed antibiotics recommended: Add vancomycin  Recommendations accepted by provider  Results for orders placed or performed during the hospital encounter of 04/18/24  Blood Culture ID Panel (Reflexed) (Collected: 04/18/2024  6:47 PM)  Result Value Ref Range   Enterococcus faecalis NOT DETECTED NOT DETECTED   Enterococcus Faecium NOT DETECTED NOT DETECTED   Listeria monocytogenes NOT DETECTED NOT DETECTED   Staphylococcus species DETECTED (A) NOT DETECTED   Staphylococcus aureus (BCID) NOT DETECTED NOT DETECTED   Staphylococcus epidermidis NOT DETECTED NOT DETECTED   Staphylococcus lugdunensis NOT DETECTED NOT DETECTED   Streptococcus species NOT DETECTED NOT DETECTED   Streptococcus agalactiae NOT DETECTED NOT DETECTED   Streptococcus pneumoniae NOT DETECTED NOT DETECTED   Streptococcus pyogenes NOT DETECTED NOT DETECTED   A.calcoaceticus-baumannii NOT DETECTED NOT DETECTED   Bacteroides fragilis NOT DETECTED NOT DETECTED   Enterobacterales NOT DETECTED NOT DETECTED   Enterobacter cloacae complex NOT DETECTED NOT DETECTED   Escherichia coli NOT DETECTED NOT DETECTED   Klebsiella aerogenes NOT DETECTED NOT DETECTED   Klebsiella oxytoca NOT DETECTED NOT DETECTED   Klebsiella pneumoniae NOT DETECTED NOT DETECTED   Proteus species NOT DETECTED NOT DETECTED   Salmonella species NOT DETECTED NOT DETECTED   Serratia marcescens NOT DETECTED NOT DETECTED   Haemophilus influenzae NOT DETECTED NOT DETECTED   Neisseria meningitidis NOT DETECTED NOT DETECTED   Pseudomonas aeruginosa NOT DETECTED NOT DETECTED    Stenotrophomonas maltophilia NOT DETECTED NOT DETECTED   Candida albicans NOT DETECTED NOT DETECTED   Candida auris NOT DETECTED NOT DETECTED   Candida glabrata NOT DETECTED NOT DETECTED   Candida krusei NOT DETECTED NOT DETECTED   Candida parapsilosis NOT DETECTED NOT DETECTED   Candida tropicalis NOT DETECTED NOT DETECTED   Cryptococcus neoformans/gattii NOT DETECTED NOT DETECTED   Prentice Poisson, PharmD Clinical Pharmacist **Pharmacist phone directory can now be found on [email or web address] (PW [email or web address]).  Listed under [facility].

## 2024-04-19 NOTE — Interval H&P Note (Signed)
 History and Physical Interval Note:  04/19/2024 3:13 PM  Perlita DANYLE BOENING  has presented today for surgery, with the diagnosis of nstemi.  The various methods of treatment have been discussed with the patient and family. After consideration of risks, benefits and other options for treatment, the patient has consented to  Procedure(s): RIGHT/LEFT HEART CATH AND CORONARY ANGIOGRAPHY (N/A) as a surgical intervention.  The patient's history has been reviewed, patient examined, no change in status, stable for surgery.  I have reviewed the patient's chart and labs.  Questions were answered to the patient's satisfaction.    Cath Lab Visit (complete for each Cath Lab visit)  Clinical Evaluation Leading to the Procedure:   ACS: Yes.    Non-ACS:    Anginal Classification: CCS III  Anti-ischemic medical therapy: No Therapy  Non-Invasive Test Results: No non-invasive testing performed  Prior CABG: No previous CABG        Lonni Cash

## 2024-04-19 NOTE — Consult Note (Signed)
 Initial Consultation Note   Patient: Colleen Fleming DOB: Nov 30, 1955 PCP: Boneta Cleaster BRAVO, [name] DOA: 04/18/2024 DOS: the patient was seen and examined on 04/19/2024 Primary service: Gail Laurene LABOR, MD  Referring provider: Maryelizabeth Sos, NP / Dr. Peter [place] / Dr. Laurene Gail  Reason for consult: Positive blood culture  HPI/course: Colleen Fleming is a 68 y.o. female with medical history significant of hyperlipidemia, GERD, CAD, CHF, emphysema, anxiety, depression, obesity, small cell lung cancer presented with STEMI.  Patient had been feeling unwell for couple days had associated nausea and vomiting and some intermittent confusion.  EMS was called and patient noted to have evidence of a STEMI on EKG.  Patient taken to the ED as a code STEMI.  Believed to be a late stage infarct.  Troponin greater than 24,000.  ED workup included chest x-ray without acute normality, CT head without acute abnormality.  CT abdomen pelvis with hiatal hernia, gallstones, diverticulosis, adnexal cyst with recommendation for follow-up.  Labs showed elevated LFTs and leukocytosis to 17.1.  No fever no infectious source so was presumed to be reactive.  Labs also notable for hemoglobin of 9.1 and sodium of 127.  Patient has undergone catheterization and noted to have occlusion of LAD.  Echocardiogram still pending.  Cardiology is optimizing medications and further management of CAD/STEMI.  Patient denies fevers, chills, chest pain, shortness of breath, abdominal pain, constipation, diarrhea, nausea, vomiting.  Review of Systems: As per HPI otherwise all other systems reviewed and are negative.  Past Medical History:  Diagnosis Date   Acute ST elevation myocardial infarction (STEMI) involving left anterior descending (LAD) coronary artery (HCC) 01/30/2021   99% thrombotic subtotal occlusion of Prox LAD => (DES PCI LAD).  apical LAD occlusion with distal embolization.  EF estimated 40-45% w/  mid-apical anterior HK; ~ normal LVEDP with SBP 98 mmHg. => Echo EF 20 to 25% with entire anterior wall hypokinesis and apical akinesis.   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  GR 2 DD.   COPD (chronic obstructive pulmonary disease) (HCC)    Long-term smoker greater than 35 years 1 to 2 pack a day.   Coronary artery disease involving native coronary artery of native heart with unstable angina pectoris (HCC)    Presented with anterior STEMI-99% subtotal occluded proximal LAD-DES PCI (Synergy DES 2.5 mm 60 mm - 2.8 mm) distally apical thromboembolism.  Reduced EF with anterior and apical hypokinesis.   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  On midodrine  for blood pressure support.  Previously not able to tolerate beta-blocker or other CHF medications.   Ischemic cardiomyopathy 08/16/2021   EF 20 to 25% following anterior STEMI, despite LAD PCI.SABRA  Unable to titrate medications due to hypotension.   Obesity    Panlobular emphysema (HCC)    Seizure (HCC) 08/11/2013   Small cell lung cancer, left upper lobe (HCC) 2014   Treated with radiation and chemotherapy [facility])    Past Surgical History:  Procedure Laterality Date   CORONARY/GRAFT ACUTE MI REVASCULARIZATION N/A 01/30/2021   Procedure: Coronary/Graft Acute MI Revascularization;  Surgeon: Anner Alm ORN, MD;  Location: [facility] INVASIVE CV LAB;  Service: Cardiovascular; Prox LAD 99% (-> 0%) DES PCI (Synergy DES 2.5 mm x 16 mm - 2.68mm) -> distal embolization with apical 80% occlusion   ICD IMPLANT N/A 11/19/2021   Procedure: ICD IMPLANT;  Surgeon: Waddell Danelle ORN, MD;  Location: [facility]  INVASIVE CV LAB;  Service: Cardiovascular;  Laterality: N/A;   LEFT HEART CATH AND CORONARY ANGIOGRAPHY N/A 01/30/2021   Procedure: LEFT HEART CATH AND CORONARY ANGIOGRAPHY;  Surgeon: Anner Alm ORN, MD;  Location: [facility] INVASIVE CV LAB;  Service: Cardiovascular; ANT STEMI: 99% thrombotic subtotal occlusion of Prox LAD => (DES PCI LAD). Otherwise angiographically normal coronaries with exception of apical LAD occlusion with distal embolization.  EF estimated 45% w/ mid-apical anterior HK; ~ nl LVEDP w/  SBP 98 mmHg.   RIGHT HEART CATH N/A 02/15/2021   Procedure: RIGHT HEART CATH;  Surgeon: Cherrie Toribio SAUNDERS, MD;  Location: [facility] INVASIVE CV LAB;  Service: CardiovRA = 2 mmHg; RV = 17/2 mmHg, PA = 17/4 (8) mmHg; PCW = 4 mmHg;   Ao sat = 98%, PA sat = 64%, 65%Fick Cardiac Output/Index = 3.0/2.4; Thermo CO/CI = 2.8/2.2; PVR = 1.3 [employer];   TRANSTHORACIC ECHOCARDIOGRAM  01/31/2021   (post Ant STEMI -PCI): EF 25-30%. Severe HK of entire Anterior/Anteroseptal wall & mild dyskinesis of anteroapical/apical segment. Gr 2 DD.  Normal RV size and function with mildly elevated.  RAP estimated 8 mm.  Small circumferential pericardial effusion.  Normal aortic and mitral valves.   TRANSTHORACIC ECHOCARDIOGRAM  02/01/2021   a) Limited: small-mod pericardial effusion primarily anterior to RV. IVC collapses ~50%. Nl RV & RAP. NO OVERT TAMPONADE; b) 5/14: Large, predominantly anterior pericardial effusion -> findings suggestive of early tamponade physiology, but no RV diastolic collapse and IVC not dilated.  RAP 3 mmHg. = EFFUSION LARGER, no TAMPONADE    Social History  reports that she has been smoking cigarettes. She has a 20 pack-year smoking history. She has never used smokeless tobacco. She reports that she does not currently use alcohol. She reports that she does not use drugs.  Allergies  Allergen Reactions   Penicillins Hives    Family History  Problem Relation Age of Onset   Cancer Paternal Grandmother     Cancer Paternal Aunt   Reviewed  Prior to Admission medications   Medication Sig Start Date End Date Taking? Authorizing Provider  dimenhyDRINATE (DRAMAMINE) 50 MG tablet Take 50 mg by mouth 2 (two) times daily as needed for nausea.   Yes [provider]  FLUoxetine  (PROZAC ) 20 MG capsule Take 20 mg by mouth daily. Take along with 10mg  capsule for a total daily dose of 30mg  04/04/24  Yes [provider]  lisinopril (ZESTRIL) 10 MG tablet Take 10 mg by mouth in the morning and at bedtime.   Yes [provider]  loperamide  (IMODIUM ) 2 MG capsule Take 2 mg by mouth as needed for diarrhea or loose stools.   Yes [provider]  metoprolol  succinate (TOPROL  XL) 25 MG 24 hr tablet Take 0.5 tablets (12.5 mg total) by mouth daily. Patient taking differently: Take 25 mg by mouth in the morning and at bedtime. 12/11/23  Yes Anner Alm ORN, MD  OLANZapine  (ZYPREXA ) 5 MG tablet Take 5 mg by mouth at bedtime.   Yes [provider]  PROZAC  10 MG capsule Take 10 mg by mouth daily. 01/31/22  Yes [provider]  acetaminophen  (TYLENOL ) 500 MG tablet Take 500 mg by mouth  every 6 (six) hours as needed for mild pain. Patient not taking: Reported on 04/18/2024    [provider]  aspirin  EC 81 MG tablet Take 81 mg by mouth as needed. (0900) Swallow whole. Patient not taking: Reported on 04/18/2024    [provider]  atorvastatin  (LIPITOR ) 80 MG tablet Take 1 tablet (80 mg total) by mouth daily. Patient not taking: Reported on 04/18/2024 02/05/21   Marcine Catalan M, [name]  calcium  carbonate (TUMS EX) 750 MG chewable tablet Chew 2 tablets by mouth daily as needed for heartburn. Patient not taking: Reported on 04/18/2024    [provider]  Cholecalciferol (VITAMIN D3) 25 MCG (1000 UT) CAPS Take 1 capsule by mouth daily. Patient not taking: Reported on 11/21/2023 02/10/22   [provider]  famotidine  (PEPCID ) 20 MG tablet Take 1  tablet (20 mg total) by mouth at bedtime. Patient not taking: Reported on 11/21/2023 06/10/22   Pearlean Manus, MD  lipase/protease/amylase (CREON ) 12000-38000 units CPEP capsule Take 1 capsule (12,000 Units total) by mouth 3 (three) times daily before meals. Patient not taking: Reported on 04/18/2024 06/10/22   Pearlean Manus, MD  magnesium  oxide (MAG-OX) 400 MG tablet Take 400 mg by mouth daily. Patient not taking: Reported on 04/18/2024 11/06/23   [provider]    Physical Exam: Vitals:   04/19/24 1200 04/19/24 1300 04/19/24 1400 04/19/24 1521  BP: 124/81 119/83 116/88   Pulse: (!) 106 98 93   Resp: 17 (!) 33 (!) 26   Temp:      TempSrc:      SpO2: 98% 98% 95% 97%  Weight:      Height:        Physical Exam Constitutional:      General: She is not in acute distress.    Appearance: Normal appearance.  HENT:     Head: Normocephalic and atraumatic.     Mouth/Throat:     Mouth: Mucous membranes are moist.     Pharynx: Oropharynx is clear.  Eyes:     Extraocular Movements: Extraocular movements intact.     Pupils: Pupils are equal, round, and reactive to light.  Cardiovascular:     Rate and Rhythm: Normal rate and regular rhythm.     Pulses: Normal pulses.     Heart sounds: Normal heart sounds.  Pulmonary:     Effort: Pulmonary effort is normal. No respiratory distress.     Breath sounds: Normal breath sounds.  Abdominal:     General: Bowel sounds are normal. There is no distension.     Palpations: Abdomen is soft.     Tenderness: There is no abdominal tenderness.  Musculoskeletal:        General: No swelling or deformity.  Skin:    General: Skin is warm and dry.  Neurological:     General: No focal deficit present.     Mental Status: Mental status is at baseline.    Labs on Admission: I have personally reviewed following labs and imaging studies  CBC: Recent Labs  Lab 04/18/24 1839 04/18/24 1855 04/19/24 0340 04/19/24 1555  WBC 17.7*  --  16.1*  --    NEUTROABS 14.8*  --   --   --   HGB 9.1* 9.5* 8.0* 7.1*  HCT 28.0* 28.0* 24.0* 21.0*  MCV 85.9  --  84.2  --   PLT 289  --  245  --     Basic Metabolic Panel: Recent Labs  Lab 04/18/24 1839 04/18/24 1855  04/19/24 1555  NA 127* 127* 128*  K 3.8 3.7 3.8  CL 92*  --   --   CO2 21*  --   --   GLUCOSE 129*  --   --   BUN 9  --   --   CREATININE 0.76  --   --   CALCIUM  8.7*  --   --   MG 1.7  --   --     GFR: Estimated Creatinine Clearance: 44.7 mL/min (by C-G formula based on SCr of 0.76 mg/dL).  Liver Function Tests: Recent Labs  Lab 04/18/24 1839  AST 291*  ALT 50*  ALKPHOS 88  BILITOT 0.5  PROT 6.4*  ALBUMIN 3.2*    Urine analysis:    Component Value Date/Time   COLORURINE YELLOW 07/22/2022 1131   APPEARANCEUR HAZY (A) 07/22/2022 1131   LABSPEC 1.002 (L) 07/22/2022 1131   PHURINE 7.0 07/22/2022 1131   GLUCOSEU NEGATIVE 07/22/2022 1131   HGBUR NEGATIVE 07/22/2022 1131   BILIRUBINUR NEGATIVE 07/22/2022 1131   KETONESUR NEGATIVE 07/22/2022 1131   PROTEINUR NEGATIVE 07/22/2022 1131   NITRITE NEGATIVE 07/22/2022 1131   LEUKOCYTESUR TRACE (A) 07/22/2022 1131    Radiological Exams on Admission: CARDIAC CATHETERIZATION Result Date: 04/19/2024   Ost LAD to Prox LAD lesion is 100% stenosed. Late presenting MI secondary to occlusion of the proximal LAD stented segment. No obstructive disease in the Circumflex or RCA RA 8 RV 29/6/12 PA 31/16 mean 23 PCWP 15 LV 94/10/21 CO 4.87 L/min CI 3.06 Recommendations: Will not attempt to open LAD as it has likely been occluded for 72 hours. Medical management. Case reviewed with Dr. [place].   CT ABDOMEN PELVIS WO CONTRAST Result Date: 04/18/2024 CLINICAL DATA:  Abdominal pain EXAM: CT ABDOMEN AND PELVIS WITHOUT CONTRAST TECHNIQUE: Multidetector CT imaging of the abdomen and pelvis was performed following the standard protocol without IV contrast. RADIATION DOSE REDUCTION: This exam was performed according to the departmental  dose-optimization program which includes automated exposure control, adjustment of the mA and/or kV according to patient size and/or use of iterative reconstruction technique. COMPARISON:  11/08/2017 FINDINGS: Lower chest: Lung bases are free of acute infiltrate or sizable effusion. Calcified granuloma is noted in the right base as well as in the right middle lobe. No definitive nodular changes are seen. Hepatobiliary: Dependent gallstones are noted within the gallbladder. The liver is within normal limits. Pancreas: Unremarkable. No pancreatic ductal dilatation or surrounding inflammatory changes. Spleen: Normal in size without focal abnormality. Adrenals/Urinary Tract: Adrenal glands are thickened worse on the left than the right but stable in appearance from the prior exam consistent with hyperplasia. No discrete nodule is noted. The kidneys are well visualized bilaterally. Renal cystic change is again noted. No follow-up is recommended. No renal calculi or obstructive changes are seen. The bladder is well distended. Stomach/Bowel: Scattered diverticular change of the colon is noted without evidence of diverticulitis. The appendix is within normal limits. Small bowel and stomach are unremarkable with the exception of a moderate-sized sliding-type hiatal hernia. Vascular/Lymphatic: Aortic atherosclerosis. No enlarged abdominal or pelvic lymph nodes. Reproductive: Uterus has been surgically removed. 3.4 cm simple appearing cyst is noted within the right adnexa. Other: No abdominal wall hernia or abnormality. No abdominopelvic ascites. Musculoskeletal: Chronic T12 and L1 compression deformities are noted. No acute bony abnormality is seen. IMPRESSION: Moderate-sized hiatal hernia. Cholelithiasis without complicating factors. Diverticulosis without diverticulitis. Right adnexal simple-appearing cyst measuring 3.4 cm. Recommend follow-up pelvic ultrasound in 6-12 months. Reference: JACR 2020  Feb;17(2):248-254  Electronically Signed   By: Oneil Devonshire M.D.   On: 04/18/2024 20:41   CT Head Wo Contrast Result Date: 04/18/2024 CLINICAL DATA:  Altered mental status EXAM: CT HEAD WITHOUT CONTRAST TECHNIQUE: Contiguous axial images were obtained from the base of the skull through the vertex without intravenous contrast. RADIATION DOSE REDUCTION: This exam was performed according to the departmental dose-optimization program which includes automated exposure control, adjustment of the mA and/or kV according to patient size and/or use of iterative reconstruction technique. COMPARISON:  02/20/2021 FINDINGS: Brain: No evidence of acute infarction, hemorrhage, hydrocephalus, extra-axial collection or mass lesion/mass effect. Chronic atrophic and ischemic changes are noted stable from the prior exam. Vascular: No hyperdense vessel or unexpected calcification. Skull: Normal. Negative for fracture or focal lesion. Sinuses/Orbits: No acute finding. Other: None IMPRESSION: Chronic atrophic and ischemic changes.  No acute abnormality noted. Electronically Signed   By: Oneil Devonshire M.D.   On: 04/18/2024 20:36   DG Chest Port 1 View Result Date: 04/18/2024 CLINICAL DATA:  Possible sepsis EXAM: PORTABLE CHEST 1 VIEW COMPARISON:  08/15/2022 FINDINGS: Cardiac shadow is stable. Defibrillator is again noted. The lungs are well aerated bilaterally. No focal infiltrate or effusion is seen. Chronic scarring in the left suprahilar region is again noted consistent with prior therapy. IMPRESSION: No acute abnormality noted. Electronically Signed   By: Oneil Devonshire M.D.   On: 04/18/2024 19:37   EKG: Independently reviewed.  EKG yesterday showed sinus rhythm at 91 bpm.  Minimal ST elevation in lead I and aVL with ST depression in 2 3 and aVF.  Assessment/Plan Active Problems:   Chronic combined systolic and diastolic CHF, NYHA class 2 (HCC)   GERD (gastroesophageal reflux disease)   Hyperlipidemia with target LDL less than 70   Anxiety as  acute reaction to exceptional stress   Coronary artery disease involving native coronary artery of native heart with unstable angina pectoris (HCC)   Hyponatremia   STEMI (ST elevation myocardial infarction) (HCC)   Positive blood culture > Patient noted to have 1 out of 2 blood cultures come back is positive for gram-positive cocci in clusters.  BC ID showed Staphylococcus. > Infectious workup in the ED on admission included chest x-ray which showed no acute abnormality.  CT abdomen pelvis which showed no acute infectious abnormality, urinalysis which was not yet done. > CBC on admission showed leukocytosis 17.7 believed to be reactive in the setting of STEMI, repeat today improving to 16.1.  Patient remains afebrile. > Suspect this is a contaminant given only 1 of 2 cultures are positive. - Agree with repeat blood cultures and vancomycin  while awaiting repeat culture results - Agree with getting urinalysis to confirm no infection - Trend fever curve and WBC - Supportive care  STEMI > Presented with feeling unwell with nausea vomiting and some intermittent confusion.  Found no evidence of STEMI by EMS.  Troponin greater than 24,000.  Leave to be late presentation, taken for cath today and showed occluded LAD. > Echocardiogram is pending.  Lipid panel showed cholesterol 232, lipids 163.  A1c normal. - Management per primary team  Iron deficiency anemia > Hemoglobin noted to be 9.1 on admission and 8.8 on repeat. Low iron and ferritin on workup by primary team. - Iron supplementation, may benefit from dose of IV iron (reasonable to wait until after repeat cultures are negative)  Hyperlipidemia - Remains on atorvastatin   Anxiety Depression - Remains on fluoxetine  and olanzapine   TRH will continue to follow  the patient.

## 2024-04-19 NOTE — Progress Notes (Signed)
    Blood culture from yesterday gram positive cocci in cluster >>Staph. Notified by pharmacy team.   She is admitted for late presenting anterior MI. Cardiac cath today showed  MI secondary to occlusion of the proximal LAD stented segment.  No obstructive disease in the Circumflex or RCA  Diagnostic at admission showed Hs trop up to  24000,  hyponatremia 127, transaminitis, leukocytosis. CXR and respiratory panel negative. CT AP showed Moderate-sized hiatal hernia.Cholelithiasis without complicating factors. Diverticulosis without diverticulitis.Right adnexal simple-appearing cyst measuring 3.4 cm.   She did have c/o abdominal pain, FTT, N/V, and confusion at admission based on note review.   Positive blood culture  Leukocytosis  GI infectious symptoms - will repeat blood culture x2 - start IV vancomycin   - will obtain UA for further infectious workup  - Echo pending, may need TEE given + blood culture  - will consult hospitalist, page sent

## 2024-04-20 ENCOUNTER — Inpatient Hospital Stay (HOSPITAL_COMMUNITY)

## 2024-04-20 ENCOUNTER — Encounter (HOSPITAL_COMMUNITY): Payer: Self-pay | Admitting: Cardiovascular Disease

## 2024-04-20 DIAGNOSIS — I2102 ST elevation (STEMI) myocardial infarction involving left anterior descending coronary artery: Secondary | ICD-10-CM

## 2024-04-20 DIAGNOSIS — I213 ST elevation (STEMI) myocardial infarction of unspecified site: Secondary | ICD-10-CM

## 2024-04-20 LAB — COMPREHENSIVE METABOLIC PANEL WITH GFR
ALT: 30 U/L (ref 0–44)
AST: 99 U/L — ABNORMAL HIGH (ref 15–41)
Albumin: 2.3 g/dL — ABNORMAL LOW (ref 3.5–5.0)
Alkaline Phosphatase: 69 U/L (ref 38–126)
Anion gap: 8 (ref 5–15)
BUN: 6 mg/dL — ABNORMAL LOW (ref 8–23)
CO2: 20 mmol/L — ABNORMAL LOW (ref 22–32)
Calcium: 7.8 mg/dL — ABNORMAL LOW (ref 8.9–10.3)
Chloride: 97 mmol/L — ABNORMAL LOW (ref 98–111)
Creatinine, Ser: 0.85 mg/dL (ref 0.44–1.00)
GFR, Estimated: >60 mL/min (ref >60)
Glucose, Bld: 96 mg/dL (ref 70–99)
Potassium: 3.4 mmol/L — ABNORMAL LOW (ref 3.5–5.1)
Sodium: 125 mmol/L — ABNORMAL LOW (ref 135–145)
Total Bilirubin: 0.6 mg/dL (ref 0.0–1.2)
Total Protein: 5.2 g/dL — ABNORMAL LOW (ref 6.5–8.1)

## 2024-04-20 LAB — URINALYSIS, ROUTINE W REFLEX MICROSCOPIC
Bilirubin Urine: NEGATIVE
Glucose, UA: NEGATIVE mg/dL
Hgb urine dipstick: NEGATIVE
Ketones, ur: 5 mg/dL — AB
Leukocytes,Ua: NEGATIVE
Nitrite: NEGATIVE
Protein, ur: NEGATIVE mg/dL
Specific Gravity, Urine: 1.014 (ref 1.005–1.030)
pH: 6 (ref 5.0–8.0)

## 2024-04-20 LAB — ECHOCARDIOGRAM COMPLETE
AR max vel: 1.39 cm2
AV Area VTI: 1.57 cm2
AV Area mean vel: 1.38 cm2
AV Mean grad: 5 mmHg
AV Peak grad: 7.5 mmHg
Ao pk vel: 1.37 m/s
Area-P 1/2: 4.17 cm2
Height: 58 in
Weight: 1703.71 [oz_av]

## 2024-04-20 LAB — OCCULT BLOOD X 1 CARD TO LAB, STOOL: Fecal Occult Bld: POSITIVE — AB

## 2024-04-20 LAB — MAGNESIUM: Magnesium: 1.9 mg/dL (ref 1.7–2.4)

## 2024-04-20 LAB — ABO/RH: ABO/RH(D): A NEG

## 2024-04-20 LAB — PREPARE RBC (CROSSMATCH)

## 2024-04-20 LAB — OSMOLALITY: Osmolality: 269 mosm/kg — ABNORMAL LOW (ref 275–295)

## 2024-04-20 LAB — OSMOLALITY, URINE: Osmolality, Ur: 269 mosm/kg — ABNORMAL LOW (ref 300–900)

## 2024-04-20 LAB — GLUCOSE, CAPILLARY: Glucose-Capillary: 143 mg/dL — ABNORMAL HIGH (ref 70–99)

## 2024-04-20 LAB — SODIUM, URINE, RANDOM: Sodium, Ur: 14 mmol/L

## 2024-04-20 MED ORDER — FUROSEMIDE 10 MG/ML IJ SOLN
10.0000 mg | Freq: Once | INTRAMUSCULAR | Status: AC
  Administered 2024-04-20: 10 mg via INTRAVENOUS
  Filled 2024-04-20: qty 2

## 2024-04-20 MED ORDER — POTASSIUM CHLORIDE 20 MEQ PO PACK
40.0000 meq | PACK | ORAL | Status: AC
  Administered 2024-04-20 (×2): 40 meq via ORAL
  Filled 2024-04-20 (×2): qty 2

## 2024-04-20 MED ORDER — SODIUM CHLORIDE 0.9% IV SOLUTION: Freq: Once | INTRAVENOUS | Status: DC

## 2024-04-20 MED ORDER — ALBUMIN HUMAN 25 % IV SOLN
25.0000 g | INTRAVENOUS | Status: AC
  Administered 2024-04-21: 25 g via INTRAVENOUS
  Filled 2024-04-20: qty 100

## 2024-04-20 MED ORDER — MIDODRINE HCL 5 MG PO TABS
10.0000 mg | ORAL_TABLET | ORAL | Status: AC
  Administered 2024-04-21: 10 mg via ORAL
  Filled 2024-04-20: qty 2

## 2024-04-20 MED ORDER — POTASSIUM CHLORIDE CRYS ER 20 MEQ PO TBCR: 40.0000 meq | EXTENDED_RELEASE_TABLET | ORAL | Status: DC

## 2024-04-20 NOTE — Progress Notes (Signed)
 PROGRESS NOTE    Colleen Fleming  FMW:[id number] DOB: 05-30-1956 DOA: 04/18/2024 PCP: Boneta Higinio BRAVO, [name]  Outpatient Specialists:     Brief Narrative:  Patient is a 68 year old female past medical history significant for coronary artery disease, CHF, emphysema, GERD, anxiety, depression, obesity and small cell lung cancer.  Patient was admitted with STEMI.  Medical team has been consulted to assist with management of anemia and all the medical problems.  04/20/2024: Patient seen.  Patient is awaiting blood transfusion (2 units of packed red blood cells).  Will give IV Lasix  10 Mg before each unit of packed red blood cells.  No significant history from patient.  Patient reports not feeling well, but could not give details.  BMP done today revealed sodium of 125, potassium of 3.4, chloride 97, CO2 of 20, BUN of 6, serum creatinine of 0.85, blood sugar of 96.  Albumin  is 2.3.  H/H of 7.1 g/dL and 21.  No chest pain reported.  Assessment & Plan:   Active Problems:   GERD (gastroesophageal reflux disease)   Hyperlipidemia with target LDL less than 70   Anxiety as acute reaction to exceptional stress   Coronary artery disease involving native coronary artery of native heart with unstable angina pectoris (HCC)   Hyponatremia   Chronic combined systolic and diastolic CHF, NYHA class 2 (HCC)   STEMI (ST elevation myocardial infarction) (HCC)   Positive blood culture   STEMI: - Cardiology team is directing care.  Anemia, likely iron deficiency anemia: - Awaiting blood transfusion. - Start patient on iron from tomorrow. - Continue to assess for possible blood loss.  Hyperlipidemia: - Continue atorvastatin .  Anxiety: Depression: - Continue fluoxetine  and olanzapine .  Positive blood culture: - Follow repeat cultures. - Likely contaminant.   DVT prophylaxis: SCD. Code Status: Full code. Family Communication:  Disposition Plan: Inpatient.   Procedures:   None.  Antimicrobials:  IV vancomycin .  Discontinue vancomycin  if repeat cultures come back negative.   Subjective: Poor historian. No chest pain. Reports not feeling well, without details.   Objective: Vitals:   04/20/24 0600 04/20/24 0715 04/20/24 0800 04/20/24 0808  BP: (!) 84/59 (!) 85/59 (!) 81/60   Pulse: 85 92 87 86  Resp: (!) 23 (!) 22 (!) 25 (!) 22  Temp:  (!) 96.7 F (35.9 C)    TempSrc:  Axillary    SpO2: 94% 94% 95% 95%  Weight:      Height:        Intake/Output Summary (Last 24 hours) at 04/20/2024 0837 Last data filed at 04/19/2024 2100 Gross per 24 hour  Intake 1329.49 ml  Output --  Net 1329.49 ml   Filed Weights   04/18/24 1829 04/19/24 0000  Weight: 49.9 kg 48.3 kg    Examination:  General exam: Chronically ill looking.  Not in distress.  Looks pale.    Respiratory system: Clear to auscultation.  Cardiovascular system: S1 & S2 heard Gastrointestinal system: Abdomen is soft and nontender.   Central nervous system: Awake and alert.    Data Reviewed: I have personally reviewed following labs and imaging studies  CBC: Recent Labs  Lab 04/18/24 1839 04/18/24 1855 04/19/24 0340 04/19/24 1555 04/19/24 1558  WBC 17.7*  --  16.1*  --   --   NEUTROABS 14.8*  --   --   --   --   HGB 9.1* 9.5* 8.0* 7.1* 7.1*  HCT 28.0* 28.0* 24.0* 21.0* 21.0*  MCV 85.9  --  84.2  --   --  PLT 289  --  245  --   --    Basic Metabolic Panel: Recent Labs  Lab 04/18/24 1839 04/18/24 1855 04/19/24 1555 04/19/24 1558 04/20/24 0407  NA 127* 127* 128* 129* 125*  K 3.8 3.7 3.8 3.9 3.4*  CL 92*  --   --   --  97*  CO2 21*  --   --   --  20*  GLUCOSE 129*  --   --   --  96  BUN 9  --   --   --  6*  CREATININE 0.76  --   --   --  0.85  CALCIUM  8.7*  --   --   --  7.8*  MG 1.7  --   --   --   --    GFR: Estimated Creatinine Clearance: 42 mL/min (by C-G formula based on SCr of 0.85 mg/dL). Liver Function Tests: Recent Labs  Lab 04/18/24 1839  04/20/24 0407  AST 291* 99*  ALT 50* 30  ALKPHOS 88 69  BILITOT 0.5 0.6  PROT 6.4* 5.2*  ALBUMIN  3.2* 2.3*   Recent Labs  Lab 04/18/24 1839  LIPASE 21   Recent Labs  Lab 04/18/24 2000  AMMONIA 16   Coagulation Profile: Recent Labs  Lab 04/18/24 1839  INR 1.1   Cardiac Enzymes: No results for input(s): CKTOTAL, CKMB, CKMBINDEX, TROPONINI in the last 168 hours. BNP (last 3 results) No results for input(s): PROBNP in the last 8760 hours. HbA1C: Recent Labs    04/18/24 1839  HGBA1C 5.2   CBG: No results for input(s): GLUCAP in the last 168 hours. Lipid Profile: Recent Labs    04/18/24 1839  CHOL 232*  HDL 51  LDLCALC 163*  TRIG 92  CHOLHDL 4.5   Thyroid Function Tests: No results for input(s): TSH, T4TOTAL, FREET4, T3FREE, THYROIDAB in the last 72 hours. Anemia Panel: Recent Labs    04/19/24 1006  VITAMINB12 251  FOLATE 16.1  FERRITIN 21  TIBC 342  IRON 11*  RETICCTPCT 2.1   Urine analysis:    Component Value Date/Time   COLORURINE YELLOW 07/22/2022 1131   APPEARANCEUR HAZY (A) 07/22/2022 1131   LABSPEC 1.002 (L) 07/22/2022 1131   PHURINE 7.0 07/22/2022 1131   GLUCOSEU NEGATIVE 07/22/2022 1131   HGBUR NEGATIVE 07/22/2022 1131   BILIRUBINUR NEGATIVE 07/22/2022 1131   KETONESUR NEGATIVE 07/22/2022 1131   PROTEINUR NEGATIVE 07/22/2022 1131   NITRITE NEGATIVE 07/22/2022 1131   LEUKOCYTESUR TRACE (A) 07/22/2022 1131   Sepsis Labs: @LABRCNTIP (procalcitonin:4,lacticidven:4)  ) Recent Results (from the past 240 hours)  Resp panel by RT-PCR (RSV, Flu A&B, Covid) Anterior Nasal Swab     Status: None   Collection Time: 04/18/24  6:42 PM   Specimen: Anterior Nasal Swab  Result Value Ref Range Status   SARS Coronavirus 2 by RT PCR NEGATIVE NEGATIVE Final   Influenza A by PCR NEGATIVE NEGATIVE Final   Influenza B by PCR NEGATIVE NEGATIVE Final    Comment: (NOTE) The Xpert Xpress SARS-CoV-2/FLU/RSV plus assay is intended as an  aid in the diagnosis of influenza from Nasopharyngeal swab specimens and should not be used as a sole basis for treatment. Nasal washings and aspirates are unacceptable for Xpert Xpress SARS-CoV-2/FLU/RSV testing.  Fact Sheet for Patients: BloggerCourse.com  Fact Sheet for Healthcare Providers: SeriousBroker.it  This test is not yet approved or cleared by the [place]  FDA and has been authorized for detection and/or diagnosis of SARS-CoV-2 by FDA under an  Emergency Use Authorization (EUA). This EUA will remain in effect (meaning this test can be used) for the duration of the COVID-19 declaration under Section 564(b)(1) of the Act, 21 U.S.C. section 360bbb-3(b)(1), unless the authorization is terminated or revoked.     Resp Syncytial Virus by PCR NEGATIVE NEGATIVE Final    Comment: (NOTE) Fact Sheet for Patients: BloggerCourse.com  Fact Sheet for Healthcare Providers: SeriousBroker.it  This test is not yet approved or cleared by the [place]  FDA and has been authorized for detection and/or diagnosis of SARS-CoV-2 by FDA under an Emergency Use Authorization (EUA). This EUA will remain in effect (meaning this test can be used) for the duration of the COVID-19 declaration under Section 564(b)(1) of the Act, 21 U.S.C. section 360bbb-3(b)(1), unless the authorization is terminated or revoked.  Performed at [facility], [address], [place], [place] [address]   Blood Culture (routine x 2)     Status: None (Preliminary result)   Collection Time: 04/18/24  6:42 PM   Specimen: BLOOD  Result Value Ref Range Status   Specimen Description BLOOD RIGHT ANTECUBITAL  Final   Special Requests   Final    BOTTLES DRAWN AEROBIC AND ANAEROBIC Blood Culture results may not be optimal due to an inadequate volume of blood received in culture bottles   Culture   Final    NO  GROWTH 2 DAYS Performed at [facility], [address], [place], [place] [address]    Report Status PENDING  Incomplete  Blood Culture (routine x 2)     Status: None (Preliminary result)   Collection Time: 04/18/24  6:47 PM   Specimen: BLOOD  Result Value Ref Range Status   Specimen Description BLOOD LEFT ANTECUBITAL  Final   Special Requests   Final    BOTTLES DRAWN AEROBIC AND ANAEROBIC Blood Culture results may not be optimal due to an inadequate volume of blood received in culture bottles   Culture  Setup Time   Final    GRAM POSITIVE COCCI IN CLUSTERS IN BOTH AEROBIC AND ANAEROBIC BOTTLES CRITICAL RESULT CALLED TO, READ BACK BY AND VERIFIED WITH: PHARMD ANDREW MEYER ON 04/19/24 @ 1600 BY DRT Performed at [facility], [address], [place], [place] [address]    Culture GRAM POSITIVE COCCI IN CLUSTERS  Final   Report Status PENDING  Incomplete  Blood Culture ID Panel (Reflexed)     Status: Abnormal   Collection Time: 04/18/24  6:47 PM  Result Value Ref Range Status   Enterococcus faecalis NOT DETECTED NOT DETECTED Final   Enterococcus Faecium NOT DETECTED NOT DETECTED Final   Listeria monocytogenes NOT DETECTED NOT DETECTED Final   Staphylococcus species DETECTED (A) NOT DETECTED Final    Comment: CRITICAL RESULT CALLED TO, READ BACK BY AND VERIFIED WITH: PHARMD ANDREW MEYER ON 04/19/24 @ 1600 BY DRT    Staphylococcus aureus (BCID) NOT DETECTED NOT DETECTED Final   Staphylococcus epidermidis NOT DETECTED NOT DETECTED Final   Staphylococcus lugdunensis NOT DETECTED NOT DETECTED Final   Streptococcus species NOT DETECTED NOT DETECTED Final   Streptococcus agalactiae NOT DETECTED NOT DETECTED Final   Streptococcus pneumoniae NOT DETECTED NOT DETECTED Final   Streptococcus pyogenes NOT DETECTED NOT DETECTED Final   A.calcoaceticus-baumannii NOT DETECTED NOT DETECTED Final   Bacteroides fragilis NOT DETECTED NOT DETECTED Final   Enterobacterales NOT DETECTED NOT  DETECTED Final   Enterobacter cloacae complex NOT DETECTED NOT DETECTED Final   Escherichia coli NOT DETECTED  NOT DETECTED Final   Klebsiella aerogenes NOT DETECTED NOT DETECTED Final   Klebsiella oxytoca NOT DETECTED NOT DETECTED Final   Klebsiella pneumoniae NOT DETECTED NOT DETECTED Final   Proteus species NOT DETECTED NOT DETECTED Final   Salmonella species NOT DETECTED NOT DETECTED Final   Serratia marcescens NOT DETECTED NOT DETECTED Final   Haemophilus influenzae NOT DETECTED NOT DETECTED Final   Neisseria meningitidis NOT DETECTED NOT DETECTED Final   Pseudomonas aeruginosa NOT DETECTED NOT DETECTED Final   Stenotrophomonas maltophilia NOT DETECTED NOT DETECTED Final   Candida albicans NOT DETECTED NOT DETECTED Final   Candida auris NOT DETECTED NOT DETECTED Final   Candida glabrata NOT DETECTED NOT DETECTED Final   Candida krusei NOT DETECTED NOT DETECTED Final   Candida parapsilosis NOT DETECTED NOT DETECTED Final   Candida tropicalis NOT DETECTED NOT DETECTED Final   Cryptococcus neoformans/gattii NOT DETECTED NOT DETECTED Final    Comment: Performed at [facility], [address], [place], [place] [address]  Culture, blood (Routine X 2) w Reflex to ID Panel     Status: None (Preliminary result)   Collection Time: 04/19/24  6:15 PM   Specimen: BLOOD LEFT HAND  Result Value Ref Range Status   Specimen Description BLOOD LEFT HAND  Final   Special Requests   Final    BOTTLES DRAWN AEROBIC AND ANAEROBIC Blood Culture adequate volume   Culture   Final    NO GROWTH < 12 HOURS Performed at [facility], [address] [place], [place] [address]    Report Status PENDING  Incomplete         Radiology Studies: CARDIAC CATHETERIZATION Result Date: 04/19/2024   Ost LAD to Prox LAD lesion is 100% stenosed. Late presenting MI secondary to occlusion of the proximal LAD stented segment. No obstructive disease in the Circumflex or RCA RA 8 RV 29/6/12 PA 31/16 mean 23  PCWP 15 LV 94/10/21 CO 4.87 L/min CI 3.06 Recommendations: Will not attempt to open LAD as it has likely been occluded for 72 hours. Medical management. Case reviewed with Dr. [place].   CT ABDOMEN PELVIS WO CONTRAST Result Date: 04/18/2024 CLINICAL DATA:  Abdominal pain EXAM: CT ABDOMEN AND PELVIS WITHOUT CONTRAST TECHNIQUE: Multidetector CT imaging of the abdomen and pelvis was performed following the standard protocol without IV contrast. RADIATION DOSE REDUCTION: This exam was performed according to the departmental dose-optimization program which includes automated exposure control, adjustment of the mA and/or kV according to patient size and/or use of iterative reconstruction technique. COMPARISON:  11/08/2017 FINDINGS: Lower chest: Lung bases are free of acute infiltrate or sizable effusion. Calcified granuloma is noted in the right base as well as in the right middle lobe. No definitive nodular changes are seen. Hepatobiliary: Dependent gallstones are noted within the gallbladder. The liver is within normal limits. Pancreas: Unremarkable. No pancreatic ductal dilatation or surrounding inflammatory changes. Spleen: Normal in size without focal abnormality. Adrenals/Urinary Tract: Adrenal glands are thickened worse on the left than the right but stable in appearance from the prior exam consistent with hyperplasia. No discrete nodule is noted. The kidneys are well visualized bilaterally. Renal cystic change is again noted. No follow-up is recommended. No renal calculi or obstructive changes are seen. The bladder is well distended. Stomach/Bowel: Scattered diverticular change of the colon is noted without evidence of diverticulitis. The appendix is within normal limits. Small bowel and stomach are unremarkable with the exception of a moderate-sized sliding-type hiatal hernia. Vascular/Lymphatic: Aortic atherosclerosis.  No enlarged abdominal or pelvic lymph nodes. Reproductive: Uterus has been surgically  removed. 3.4 cm simple appearing cyst is noted within the right adnexa. Other: No abdominal wall hernia or abnormality. No abdominopelvic ascites. Musculoskeletal: Chronic T12 and L1 compression deformities are noted. No acute bony abnormality is seen. IMPRESSION: Moderate-sized hiatal hernia. Cholelithiasis without complicating factors. Diverticulosis without diverticulitis. Right adnexal simple-appearing cyst measuring 3.4 cm. Recommend follow-up pelvic ultrasound in 6-12 months. Reference: JACR 2020 Feb;17(2):248-254 Electronically Signed   By: Oneil Devonshire M.D.   On: 04/18/2024 20:41   CT Head Wo Contrast Result Date: 04/18/2024 CLINICAL DATA:  Altered mental status EXAM: CT HEAD WITHOUT CONTRAST TECHNIQUE: Contiguous axial images were obtained from the base of the skull through the vertex without intravenous contrast. RADIATION DOSE REDUCTION: This exam was performed according to the departmental dose-optimization program which includes automated exposure control, adjustment of the mA and/or kV according to patient size and/or use of iterative reconstruction technique. COMPARISON:  02/20/2021 FINDINGS: Brain: No evidence of acute infarction, hemorrhage, hydrocephalus, extra-axial collection or mass lesion/mass effect. Chronic atrophic and ischemic changes are noted stable from the prior exam. Vascular: No hyperdense vessel or unexpected calcification. Skull: Normal. Negative for fracture or focal lesion. Sinuses/Orbits: No acute finding. Other: None IMPRESSION: Chronic atrophic and ischemic changes.  No acute abnormality noted. Electronically Signed   By: Oneil Devonshire M.D.   On: 04/18/2024 20:36   DG Chest Port 1 View Result Date: 04/18/2024 CLINICAL DATA:  Possible sepsis EXAM: PORTABLE CHEST 1 VIEW COMPARISON:  08/15/2022 FINDINGS: Cardiac shadow is stable. Defibrillator is again noted. The lungs are well aerated bilaterally. No focal infiltrate or effusion is seen. Chronic scarring in the left  suprahilar region is again noted consistent with prior therapy. IMPRESSION: No acute abnormality noted. Electronically Signed   By: Oneil Devonshire M.D.   On: 04/18/2024 19:37        Scheduled Meds:  sodium chloride    Intravenous Once   aspirin  EC  81 mg Oral Daily   atorvastatin   80 mg Oral Daily   Chlorhexidine  Gluconate Cloth  6 each Topical Daily   FLUoxetine   30 mg Oral Daily   losartan   12.5 mg Oral Daily   metoprolol  succinate  25 mg Oral Daily   potassium chloride   40 mEq Oral Q4H   sodium chloride  flush  3 mL Intravenous Q12H   sodium chloride  flush  3 mL Intravenous Q12H   Continuous Infusions:  sodium chloride      vancomycin        LOS: 2 days    Time spent: 35 minutes.    Leatrice Chapel, MD  [facility] Pager #: [phone] 7PM-7AM contact night coverage as above

## 2024-04-20 NOTE — Progress Notes (Signed)
 IV team to bedside to initiate new vascular access d/t loss of IV during PRBC infusion. Iv placed. If addditional IV access needed, I recommend central access d/t poor vascular anatomy in this patient.

## 2024-04-20 NOTE — Progress Notes (Signed)
   Rounding Note    Patient Name: TRELLIS GUIRGUIS Date of Encounter: 04/20/2024  [facility] Cardiologist: Alm Clay, MD   Subjective   NAEO. Still hypotensive with systolics in the 80s. She is asymptomatic and interactive this AM.  Vital Signs    Vitals:   04/20/24 0400 04/20/24 0500 04/20/24 0600 04/20/24 0715  BP: (!) 90/56 (!) 86/56 (!) 84/59 (!) 85/59  Pulse: 94 83 85 92  Resp: (!) 22 (!) 21 (!) 23 (!) 22  Temp: 97.9 F (36.6 C)     TempSrc: Axillary     SpO2: 95% 94% 94% 94%  Weight:      Height:        Intake/Output Summary (Last 24 hours) at 04/20/2024 0735 Last data filed at 04/19/2024 2100 Gross per 24 hour  Intake 1356.49 ml  Output --  Net 1356.49 ml      04/19/2024   12:00 AM 04/18/2024    6:29 PM 11/21/2023   11:22 AM  Last 3 Weights  Weight (lbs) 106 lb 7.7 oz 110 lb 105 lb  Weight (kg) 48.3 kg 49.896 kg 47.628 kg      Telemetry    Sinus. - Personally Reviewed  ECG    Personally Reviewed  Physical Exam   GEN: No acute distress.   Cardiac: RRR, no murmurs, rubs, or gallops.  Respiratory: Clear to auscultation bilaterally. Psych: Normal affect   Assessment & Plan    #Anterior MI Chest pain free now. Late presentation MI. 100% LAD occlusion.   #Hypotension Asymptomatic.   #Anemia #Iron deficiency anemia Hgb drifting down. Now 7.1.  Suspect she would benefit from PRBC transfusion. Will ask our IM colleagues to weigh in. I discussed the blood transfusion with her son who is in agreement. I will order 2u PRBCs.  #Staph bacteremia IM following. Likely contaminant.  CRITICAL CARE Performed by: Roshad Hack T Reola Buckles   Total critical care time: 40 minutes  Critical care time was exclusive of separately billable procedures and treating other patients.  Critical care was necessary to treat or prevent imminent or life-threatening deterioration.  Critical care was time spent personally by me on the following activities:  development of treatment plan with patient and/or surrogate as well as nursing, discussions with consultants, evaluation of patient's response to treatment, examination of patient, obtaining history from patient or surrogate, ordering and performing treatments and interventions, ordering and review of laboratory studies, ordering and review of radiographic studies, pulse oximetry and re-evaluation of patient's condition.    Ole T. Cindie, MD, [facility], [facility] Cardiac Electrophysiology

## 2024-04-21 DIAGNOSIS — R7881 Bacteremia: Secondary | ICD-10-CM | POA: Diagnosis not present

## 2024-04-21 DIAGNOSIS — E785 Hyperlipidemia, unspecified: Secondary | ICD-10-CM

## 2024-04-21 DIAGNOSIS — I5042 Chronic combined systolic (congestive) and diastolic (congestive) heart failure: Secondary | ICD-10-CM

## 2024-04-21 DIAGNOSIS — I2129 ST elevation (STEMI) myocardial infarction involving other sites: Secondary | ICD-10-CM

## 2024-04-21 LAB — MAGNESIUM: Magnesium: 1.6 mg/dL — ABNORMAL LOW (ref 1.7–2.4)

## 2024-04-21 LAB — COMPREHENSIVE METABOLIC PANEL WITH GFR
ALT: 23 U/L (ref 0–44)
AST: 47 U/L — ABNORMAL HIGH (ref 15–41)
Albumin: 2.9 g/dL — ABNORMAL LOW (ref 3.5–5.0)
Alkaline Phosphatase: 107 U/L (ref 38–126)
Anion gap: 8 (ref 5–15)
BUN: 7 mg/dL — ABNORMAL LOW (ref 8–23)
CO2: 22 mmol/L (ref 22–32)
Calcium: 8.6 mg/dL — ABNORMAL LOW (ref 8.9–10.3)
Chloride: 98 mmol/L (ref 98–111)
Creatinine, Ser: 0.84 mg/dL (ref 0.44–1.00)
GFR, Estimated: >60 mL/min (ref >60)
Glucose, Bld: 89 mg/dL (ref 70–99)
Potassium: 4.3 mmol/L (ref 3.5–5.1)
Sodium: 128 mmol/L — ABNORMAL LOW (ref 135–145)
Total Bilirubin: 1.1 mg/dL (ref 0.0–1.2)
Total Protein: 5.8 g/dL — ABNORMAL LOW (ref 6.5–8.1)

## 2024-04-21 LAB — RENAL FUNCTION PANEL
Albumin: 2.9 g/dL — ABNORMAL LOW (ref 3.5–5.0)
Anion gap: 7 (ref 5–15)
BUN: 7 mg/dL — ABNORMAL LOW (ref 8–23)
CO2: 23 mmol/L (ref 22–32)
Calcium: 8.6 mg/dL — ABNORMAL LOW (ref 8.9–10.3)
Chloride: 99 mmol/L (ref 98–111)
Creatinine, Ser: 0.76 mg/dL (ref 0.44–1.00)
GFR, Estimated: >60 mL/min (ref >60)
Glucose, Bld: 87 mg/dL (ref 70–99)
Phosphorus: 2.8 mg/dL (ref 2.5–4.6)
Potassium: 4.4 mmol/L (ref 3.5–5.1)
Sodium: 129 mmol/L — ABNORMAL LOW (ref 135–145)

## 2024-04-21 LAB — TYPE AND SCREEN
ABO/RH(D): A NEG
Antibody Screen: NEGATIVE
Unit division: 0
Unit division: 0

## 2024-04-21 LAB — BPAM RBC
Blood Product Expiration Date: 202507242359
Blood Product Expiration Date: 202507252359
ISSUE DATE / TIME: 202507191020
ISSUE DATE / TIME: 202507191434
Unit Type and Rh: 600
Unit Type and Rh: 600

## 2024-04-21 LAB — CBC WITH DIFFERENTIAL/PLATELET
Abs Immature Granulocytes: 0.03 K/uL (ref 0.00–0.07)
Basophils Absolute: 0 K/uL (ref 0.0–0.1)
Basophils Relative: 0 %
Eosinophils Absolute: 0 K/uL (ref 0.0–0.5)
Eosinophils Relative: 0 %
HCT: 29 % — ABNORMAL LOW (ref 36.0–46.0)
Hemoglobin: 9.8 g/dL — ABNORMAL LOW (ref 12.0–15.0)
Immature Granulocytes: 0 %
Lymphocytes Relative: 10 %
Lymphs Abs: 0.9 K/uL (ref 0.7–4.0)
MCH: 28.2 pg (ref 26.0–34.0)
MCHC: 33.8 g/dL (ref 30.0–36.0)
MCV: 83.6 fL (ref 80.0–100.0)
Monocytes Absolute: 1.2 K/uL — ABNORMAL HIGH (ref 0.1–1.0)
Monocytes Relative: 13 %
Neutro Abs: 7.1 K/uL (ref 1.7–7.7)
Neutrophils Relative %: 77 %
Platelets: 251 K/uL (ref 150–400)
RBC: 3.47 MIL/uL — ABNORMAL LOW (ref 3.87–5.11)
RDW: 17.4 % — ABNORMAL HIGH (ref 11.5–15.5)
WBC: 9.3 K/uL (ref 4.0–10.5)
nRBC: 0 % (ref 0.0–0.2)

## 2024-04-21 LAB — CULTURE, BLOOD (ROUTINE X 2)

## 2024-04-21 LAB — HEMOGLOBIN AND HEMATOCRIT, BLOOD
HCT: 29.5 % — ABNORMAL LOW (ref 36.0–46.0)
Hemoglobin: 9.8 g/dL — ABNORMAL LOW (ref 12.0–15.0)

## 2024-04-21 MED ORDER — MIDODRINE HCL 5 MG PO TABS
10.0000 mg | ORAL_TABLET | Freq: Three times a day (TID) | ORAL | Status: DC
  Administered 2024-04-21 – 2024-04-23 (×7): 10 mg via ORAL
  Filled 2024-04-21 (×7): qty 2

## 2024-04-21 MED ORDER — MAGNESIUM SULFATE 4 GM/100ML IV SOLN
4.0000 g | Freq: Once | INTRAVENOUS | Status: AC
  Administered 2024-04-21: 4 g via INTRAVENOUS
  Filled 2024-04-21: qty 100

## 2024-04-21 NOTE — Progress Notes (Signed)
 Advanced Heart Failure Rounding Note  Cardiologist: Alm Clay, MD  Chief Complaint: Chest discomfort Subjective:    Late presentation of anterior MI, severely reduced EF, though previously reduced. She was hypotensive overnight requiring midodrine . Stopped GDMT this morning.    Objective:   Weight Range: 52 kg Body mass index is 23.96 kg/m.   Vital Signs:   Temp:  [96.8 F (36 C)-99.2 F (37.3 C)] 96.8 F (36 C) (07/20 0700) Pulse Rate:  [75-115] 89 (07/20 0800) Resp:  [0-38] 22 (07/20 1000) BP: (63-106)/(45-78) 96/73 (07/20 1000) SpO2:  [90 %-100 %] 94 % (07/20 0800) Weight:  [52 kg] 52 kg (07/20 0615) Last BM Date : 04/20/24  Weight change: Filed Weights   04/18/24 1829 04/19/24 0000 04/21/24 0615  Weight: 49.9 kg 48.3 kg 52 kg    Intake/Output:   Intake/Output Summary (Last 24 hours) at 04/21/2024 1023 Last data filed at 04/21/2024 0400 Gross per 24 hour  Intake 2006 ml  Output 1450 ml  Net 556 ml      Physical Exam    GENERAL: chronically ill appearing, much older than stated age PULM:  Normal work of breathing CARDIAC:  JVP: flat         Normal rate with regular rhythm. No murmurs, rubs or gallops.  Trace edema. Warm and well perfused extremities. ABDOMEN: Soft, non-tender, non-distended. NEUROLOGIC: Slow to respond, short answers to questions, no focal defects   Telemetry   Sinus in the 80s  Medications:     Scheduled Medications:  sodium chloride    Intravenous Once   aspirin  EC  81 mg Oral Daily   atorvastatin   80 mg Oral Daily   Chlorhexidine  Gluconate Cloth  6 each Topical Daily   FLUoxetine   30 mg Oral Daily   midodrine   10 mg Oral TID WC    Infusions:  vancomycin  Stopped (04/20/24 2212)    PRN Medications: acetaminophen , calcium  carbonate, loperamide , nitroGLYCERIN , OLANZapine , ondansetron  (ZOFRAN ) IV, mouth rinse, sodium chloride  flush    Patient Profile   Patient is a 68 y.o. female with history of lung CA s/p  chemo/RT, tobacco abuse, CAD s/p anterior STEMI in 2022 with stenting of proximal LAD, HLD,  ICM with EF 30-35%, and failure to thrive who presents with late anterior STEMI.  Assessment/Plan    CAD with prior LAD infarct, late presentation of anterior STEMI Ischemic HFrEF - Occlusion of previously stented proximal LAD, normal filling pressures, EF severely reduced 20-25% with anterior wall motion abnormalities - Stop losartan  and metoprolol  given hypotension - Start midodrine  10mg  TID for BP support - No lasix , euvolemic - Given failure to thrive (bed bound and dependent at home, previously looking for SNF placement) would benefit from placement as well as palliative care discussions, consult on Monday - Continue aspirin  daily, holding on P2Y12 given recent suspected GI bleed  ABLA:  - Noted on arrival, has received multiple units - Holding P2Y12 - Given functional status and clinical trajectory could consider outpatient GI consult  ?Bcx: Staph haemolyticus, suspect contaminant.  - Repeat Bcx today - Stop vancomycin   Failure to thrive:  - Noted on previous clinic visits, incredibly weak and frail - Suspect will need placement, may benefit from hospice referral  Medication concerns reviewed with patient and pharmacy team. Barriers identified: Holding P2Y12 with active bleed  Length of Stay: 3  Morene JINNY Brownie, MD  04/21/2024, 10:23 AM  Advanced Heart Failure Team Pager [phone] (M-F; 7a - 5p)  Please contact [facility] for  night-coverage after hours (5p -7a ) and weekends on [email or web address]

## 2024-04-21 NOTE — Progress Notes (Signed)
 PROGRESS NOTE    Colleen Fleming  FMW:[id number] DOB: 12-23-1955 DOA: 04/18/2024 PCP: Boneta Parnell BRAVO, [name]  Outpatient Specialists:     Brief Narrative:  Patient is a 68 year old female past medical history significant for coronary artery disease, CHF, emphysema, GERD, anxiety, depression, obesity and small cell lung cancer.  Patient was admitted with STEMI, mild presentation, with decreased ejection fraction (20 to 25%).  Medical team has been consulted to assist with management of anemia and medical problems.  04/20/2024: Patient seen.  Patient is awaiting blood transfusion (2 units of packed red blood cells).  Will give IV Lasix  10 Mg before each unit of packed red blood cells.  No significant history from patient.  Patient reports not feeling well, but could not give details.  BMP done today revealed sodium of 125, potassium of 3.4, chloride 97, CO2 of 20, BUN of 6, serum creatinine of 0.85, blood sugar of 96.  Albumin  is 2.3.  H/H of 7.1 g/dL and 21.  No chest pain reported.  04/21/2024: Patient seen.  No significant history from patient.  Denies chest pain.  Posttransfusion hemoglobin of 9.8 g/dL.  Episodes of hypotension noted overnight.  Patient started on midodrine .  GDMT on hold.  Plavix  is on hold.  Losartan  and metoprolol  are on hold.  Palliative care consult is being considered.  Assessment & Plan:   Active Problems:   GERD (gastroesophageal reflux disease)   Hyperlipidemia with target LDL less than 70   Anxiety as acute reaction to exceptional stress   Coronary artery disease involving native coronary artery of native heart with unstable angina pectoris (HCC)   Hyponatremia   Chronic combined systolic and diastolic CHF, NYHA class 2 (HCC)   STEMI (ST elevation myocardial infarction) (HCC)   Positive blood culture   STEMI: - Cardiology team is directing care. - Due to hypotension, many medications on hold. - Palliative care consult has been considered.  Anemia,  likely iron deficiency anemia: - Status post transfusion of 2 packed red blood cells. - Posttransfusion hemoglobin 9.8 g/dL. - Plavix  is on hold. - Continue to monitor H/H clinically..  Hyperlipidemia: - Continue atorvastatin .  Anxiety: Depression: - Continue fluoxetine  and olanzapine .  Positive blood culture: - 1 out of 4 cultures is growing Staphylococcus hemolyticus (likely contaminant) - IV vancomycin  is on hold.    Guarded prognosis.  DVT prophylaxis: SCD. Code Status: Full code. Family Communication:  Disposition Plan: Inpatient.   Procedures:  None.  Antimicrobials:  IV vancomycin  discontinued.  .   Subjective: Poor historian. No chest pain.  Objective: Vitals:   04/21/24 0800 04/21/24 0920 04/21/24 0930 04/21/24 1000  BP: (!) 83/65 (!) 77/59 (!) 87/63 96/73  Pulse: 89     Resp: 17 (!) 28 (!) 27 (!) 22  Temp:      TempSrc:      SpO2: 94%     Weight:      Height:        Intake/Output Summary (Last 24 hours) at 04/21/2024 1135 Last data filed at 04/21/2024 0400 Gross per 24 hour  Intake 1766 ml  Output 1150 ml  Net 616 ml   Filed Weights   04/18/24 1829 04/19/24 0000 04/21/24 0615  Weight: 49.9 kg 48.3 kg 52 kg    Examination:  General exam: Chronically ill looking.  Not in distress.  Looks pale.    Respiratory system: Clear to auscultation.  Cardiovascular system: S1 & S2 heard Gastrointestinal system: Abdomen is soft and nontender.   Central  nervous system: Awake and alert.    Data Reviewed: I have personally reviewed following labs and imaging studies  CBC: Recent Labs  Lab 04/18/24 1839 04/18/24 1855 04/19/24 0340 04/19/24 1555 04/19/24 1558 04/21/24 1000 04/21/24 1001  WBC 17.7*  --  16.1*  --   --  9.3  --   NEUTROABS 14.8*  --   --   --   --  7.1  --   HGB 9.1*   < > 8.0* 7.1* 7.1* 9.8* 9.8*  HCT 28.0*   < > 24.0* 21.0* 21.0* 29.0* 29.5*  MCV 85.9  --  84.2  --   --  83.6  --   PLT 289  --  245  --   --  251  --    < > =  values in this interval not displayed.   Basic Metabolic Panel: Recent Labs  Lab 04/18/24 1839 04/18/24 1855 04/19/24 1555 04/19/24 1558 04/20/24 0407 04/20/24 1203  NA 127* 127* 128* 129* 125*  --   K 3.8 3.7 3.8 3.9 3.4*  --   CL 92*  --   --   --  97*  --   CO2 21*  --   --   --  20*  --   GLUCOSE 129*  --   --   --  96  --   BUN 9  --   --   --  6*  --   CREATININE 0.76  --   --   --  0.85  --   CALCIUM  8.7*  --   --   --  7.8*  --   MG 1.7  --   --   --   --  1.9   GFR: Estimated Creatinine Clearance: 46.6 mL/min (by C-G formula based on SCr of 0.85 mg/dL). Liver Function Tests: Recent Labs  Lab 04/18/24 1839 04/20/24 0407  AST 291* 99*  ALT 50* 30  ALKPHOS 88 69  BILITOT 0.5 0.6  PROT 6.4* 5.2*  ALBUMIN  3.2* 2.3*   Recent Labs  Lab 04/18/24 1839  LIPASE 21   Recent Labs  Lab 04/18/24 2000  AMMONIA 16   Coagulation Profile: Recent Labs  Lab 04/18/24 1839  INR 1.1   Cardiac Enzymes: No results for input(s): CKTOTAL, CKMB, CKMBINDEX, TROPONINI in the last 168 hours. BNP (last 3 results) No results for input(s): PROBNP in the last 8760 hours. HbA1C: Recent Labs    04/18/24 1839  HGBA1C 5.2   CBG: Recent Labs  Lab 04/20/24 1127  GLUCAP 143*   Lipid Profile: Recent Labs    04/18/24 1839  CHOL 232*  HDL 51  LDLCALC 163*  TRIG 92  CHOLHDL 4.5   Thyroid Function Tests: No results for input(s): TSH, T4TOTAL, FREET4, T3FREE, THYROIDAB in the last 72 hours. Anemia Panel: Recent Labs    04/19/24 1006  VITAMINB12 251  FOLATE 16.1  FERRITIN 21  TIBC 342  IRON 11*  RETICCTPCT 2.1   Urine analysis:    Component Value Date/Time   COLORURINE YELLOW 04/20/2024 1048   APPEARANCEUR CLEAR 04/20/2024 1048   LABSPEC 1.014 04/20/2024 1048   PHURINE 6.0 04/20/2024 1048   GLUCOSEU NEGATIVE 04/20/2024 1048   HGBUR NEGATIVE 04/20/2024 1048   BILIRUBINUR NEGATIVE 04/20/2024 1048   KETONESUR 5 (A) 04/20/2024 1048    PROTEINUR NEGATIVE 04/20/2024 1048   NITRITE NEGATIVE 04/20/2024 1048   LEUKOCYTESUR NEGATIVE 04/20/2024 1048   Sepsis Labs: @LABRCNTIP (procalcitonin:4,lacticidven:4)  ) Recent Results (from the  past 240 hours)  Resp panel by RT-PCR (RSV, Flu A&B, Covid) Anterior Nasal Swab     Status: None   Collection Time: 04/18/24  6:42 PM   Specimen: Anterior Nasal Swab  Result Value Ref Range Status   SARS Coronavirus 2 by RT PCR NEGATIVE NEGATIVE Final   Influenza A by PCR NEGATIVE NEGATIVE Final   Influenza B by PCR NEGATIVE NEGATIVE Final    Comment: (NOTE) The Xpert Xpress SARS-CoV-2/FLU/RSV plus assay is intended as an aid in the diagnosis of influenza from Nasopharyngeal swab specimens and should not be used as a sole basis for treatment. Nasal washings and aspirates are unacceptable for Xpert Xpress SARS-CoV-2/FLU/RSV testing.  Fact Sheet for Patients: BloggerCourse.com  Fact Sheet for Healthcare Providers: SeriousBroker.it  This test is not yet approved or cleared by the [place]  FDA and has been authorized for detection and/or diagnosis of SARS-CoV-2 by FDA under an Emergency Use Authorization (EUA). This EUA will remain in effect (meaning this test can be used) for the duration of the COVID-19 declaration under Section 564(b)(1) of the Act, 21 U.S.C. section 360bbb-3(b)(1), unless the authorization is terminated or revoked.     Resp Syncytial Virus by PCR NEGATIVE NEGATIVE Final    Comment: (NOTE) Fact Sheet for Patients: BloggerCourse.com  Fact Sheet for Healthcare Providers: SeriousBroker.it  This test is not yet approved or cleared by the [place]  FDA and has been authorized for detection and/or diagnosis of SARS-CoV-2 by FDA under an Emergency Use Authorization (EUA). This EUA will remain in effect (meaning this test can be used) for the duration of  the COVID-19 declaration under Section 564(b)(1) of the Act, 21 U.S.C. section 360bbb-3(b)(1), unless the authorization is terminated or revoked.  Performed at [facility], [address], [place], [place] [address]   Blood Culture (routine x 2)     Status: None (Preliminary result)   Collection Time: 04/18/24  6:42 PM   Specimen: BLOOD  Result Value Ref Range Status   Specimen Description BLOOD RIGHT ANTECUBITAL  Final   Special Requests   Final    BOTTLES DRAWN AEROBIC AND ANAEROBIC Blood Culture results may not be optimal due to an inadequate volume of blood received in culture bottles   Culture   Final    NO GROWTH 3 DAYS Performed at [facility], [address], [place], [place] [address]    Report Status PENDING  Incomplete  Blood Culture (routine x 2)     Status: Abnormal   Collection Time: 04/18/24  6:47 PM   Specimen: BLOOD  Result Value Ref Range Status   Specimen Description BLOOD LEFT ANTECUBITAL  Final   Special Requests   Final    BOTTLES DRAWN AEROBIC AND ANAEROBIC Blood Culture results may not be optimal due to an inadequate volume of blood received in culture bottles   Culture  Setup Time   Final    GRAM POSITIVE COCCI IN CLUSTERS IN BOTH AEROBIC AND ANAEROBIC BOTTLES CRITICAL RESULT CALLED TO, READ BACK BY AND VERIFIED WITH: PHARMD ANDREW MEYER ON 04/19/24 @ 1600 BY DRT    Culture (A)  Final    STAPHYLOCOCCUS HAEMOLYTICUS THE SIGNIFICANCE OF ISOLATING THIS ORGANISM FROM A SINGLE SET OF BLOOD CULTURES WHEN MULTIPLE SETS ARE DRAWN IS UNCERTAIN. PLEASE NOTIFY THE MICROBIOLOGY DEPARTMENT WITHIN ONE WEEK IF SPECIATION AND SENSITIVITIES ARE REQUIRED. Performed at [facility], [address] [place], [place] [address]    Report Status 04/21/2024 FINAL  Final  Blood Culture ID Panel (Reflexed)     Status: Abnormal   Collection Time: 04/18/24  6:47 PM  Result Value Ref Range Status   Enterococcus faecalis NOT DETECTED NOT DETECTED Final    Enterococcus Faecium NOT DETECTED NOT DETECTED Final   Listeria monocytogenes NOT DETECTED NOT DETECTED Final   Staphylococcus species DETECTED (A) NOT DETECTED Final    Comment: CRITICAL RESULT CALLED TO, READ BACK BY AND VERIFIED WITH: PHARMD ANDREW MEYER ON 04/19/24 @ 1600 BY DRT    Staphylococcus aureus (BCID) NOT DETECTED NOT DETECTED Final   Staphylococcus epidermidis NOT DETECTED NOT DETECTED Final   Staphylococcus lugdunensis NOT DETECTED NOT DETECTED Final   Streptococcus species NOT DETECTED NOT DETECTED Final   Streptococcus agalactiae NOT DETECTED NOT DETECTED Final   Streptococcus pneumoniae NOT DETECTED NOT DETECTED Final   Streptococcus pyogenes NOT DETECTED NOT DETECTED Final   A.calcoaceticus-baumannii NOT DETECTED NOT DETECTED Final   Bacteroides fragilis NOT DETECTED NOT DETECTED Final   Enterobacterales NOT DETECTED NOT DETECTED Final   Enterobacter cloacae complex NOT DETECTED NOT DETECTED Final   Escherichia coli NOT DETECTED NOT DETECTED Final   Klebsiella aerogenes NOT DETECTED NOT DETECTED Final   Klebsiella oxytoca NOT DETECTED NOT DETECTED Final   Klebsiella pneumoniae NOT DETECTED NOT DETECTED Final   Proteus species NOT DETECTED NOT DETECTED Final   Salmonella species NOT DETECTED NOT DETECTED Final   Serratia marcescens NOT DETECTED NOT DETECTED Final   Haemophilus influenzae NOT DETECTED NOT DETECTED Final   Neisseria meningitidis NOT DETECTED NOT DETECTED Final   Pseudomonas aeruginosa NOT DETECTED NOT DETECTED Final   Stenotrophomonas maltophilia NOT DETECTED NOT DETECTED Final   Candida albicans NOT DETECTED NOT DETECTED Final   Candida auris NOT DETECTED NOT DETECTED Final   Candida glabrata NOT DETECTED NOT DETECTED Final   Candida krusei NOT DETECTED NOT DETECTED Final   Candida parapsilosis NOT DETECTED NOT DETECTED Final   Candida tropicalis NOT DETECTED NOT DETECTED Final   Cryptococcus neoformans/gattii NOT DETECTED NOT DETECTED Final     Comment: Performed at [facility], [address] [place], [place] [address]  Culture, blood (Routine X 2) w Reflex to ID Panel     Status: None (Preliminary result)   Collection Time: 04/19/24  6:15 PM   Specimen: BLOOD LEFT HAND  Result Value Ref Range Status   Specimen Description BLOOD LEFT HAND  Final   Special Requests   Final    BOTTLES DRAWN AEROBIC AND ANAEROBIC Blood Culture adequate volume   Culture   Final    NO GROWTH 2 DAYS Performed at [facility], [address], [place], [place] [address]    Report Status PENDING  Incomplete         Radiology Studies: ECHOCARDIOGRAM COMPLETE Result Date: 04/20/2024    ECHOCARDIOGRAM REPORT   Patient Name:   Colleen Fleming Date of Exam: 04/20/2024 Medical Rec #:  [id number]     Height:       58.0 in Accession #:    [id number]    Weight:       106.5 lb Date of Birth:  Dec 03, 1955     BSA:          1.393 m Patient Age:    68 years      BP:           65/53 mmHg Patient Gender: F             HR:  99 bpm. Exam Location:  Inpatient Procedure: 2D Echo, Cardiac Doppler and Color Doppler (Both Spectral and Color            Flow Doppler were utilized during procedure). Indications:    Acute myocardial infarction, unspecified I21.9  History:        Patient has prior history of Echocardiogram examinations, most                 recent 08/23/2021. CHF and Cardiomyopathy; CAD and Previous                 Myocardial Infarction.  Sonographer:    Jayson Gaskins Referring Phys: [id number] CHRISTOPHER D MCALHANY IMPRESSIONS  1. No left ventricular thrombus is seen.. Left ventricular ejection fraction, by estimation, is 20 to 25%. The left ventricle has severely decreased function. The left ventricle demonstrates regional wall motion abnormalities (see scoring diagram/findings for description). The left ventricular internal cavity size was mildly dilated. Left ventricular diastolic parameters are consistent with Grade II diastolic dysfunction  (pseudonormalization). Elevated left atrial pressure. There is mild dyskinesis of the left ventricular, entire apical segment.  2. Right ventricular systolic function is normal. The right ventricular size is normal.  3. Left atrial size was moderately dilated.  4. The mitral valve is normal in structure. Mild mitral valve regurgitation. No evidence of mitral stenosis.  5. Tricuspid valve regurgitation is moderate.  6. The aortic valve is tricuspid. Aortic valve regurgitation is not visualized. No aortic stenosis is present. Comparison(s): Prior images reviewed side by side. The left ventricular function is worsened. The left ventricular wall motion abnormalities are worse. FINDINGS  Left Ventricle: No left ventricular thrombus is seen. Left ventricular ejection fraction, by estimation, is 20 to 25%. The left ventricle has severely decreased function. The left ventricle demonstrates regional wall motion abnormalities. Mild dyskinesis of the left ventricular, entire apical segment. The left ventricular internal cavity size was mildly dilated. There is no left ventricular hypertrophy. Left ventricular diastolic parameters are consistent with Grade II diastolic dysfunction (pseudonormalization). Elevated left atrial pressure.  LV Wall Scoring: The apical lateral segment, apical septal segment, and apex are dyskinetic. The mid and distal anterior wall, mid anteroseptal segment, mid anterolateral segment, mid inferoseptal segment, and apical inferior segment are akinetic. The inferior wall, posterior wall, basal anteroseptal segment, basal anterior segment, and basal inferoseptal segment are normal. Right Ventricle: The right ventricular size is normal. No increase in right ventricular wall thickness. Right ventricular systolic function is normal. Left Atrium: Left atrial size was moderately dilated. Right Atrium: Right atrial size was normal in size. Pericardium: There is no evidence of pericardial effusion. Mitral  Valve: The mitral valve is normal in structure. Mild mitral valve regurgitation. No evidence of mitral valve stenosis. Tricuspid Valve: The tricuspid valve is normal in structure. Tricuspid valve regurgitation is moderate. Aortic Valve: The aortic valve is tricuspid. Aortic valve regurgitation is not visualized. No aortic stenosis is present. Aortic valve mean gradient measures 5.0 mmHg. Aortic valve peak gradient measures 7.5 mmHg. Aortic valve area, by VTI measures 1.57 cm. Pulmonic Valve: The pulmonic valve was grossly normal. Pulmonic valve regurgitation is trivial. No evidence of pulmonic stenosis. Aorta: The aortic root is normal in size and structure. IAS/Shunts: No atrial level shunt detected by color flow Doppler. Additional Comments: A device lead is visualized in the right ventricle and right atrium.  LEFT VENTRICLE PLAX 2D LVIDd:         4.10 cm   Diastology LV PW:  1.00 cm   LV e' medial:    6.85 cm/s LV IVS:        0.70 cm   LV E/e' medial:  16.9 LVOT diam:     1.70 cm   LV e' lateral:   10.00 cm/s LV SV:         37        LV E/e' lateral: 11.6 LV SV Index:   27 LVOT Area:     2.27 cm  RIGHT VENTRICLE RV S prime:     14.40 cm/s TAPSE (M-mode): 1.6 cm LEFT ATRIUM             Index        RIGHT ATRIUM          Index LA Vol (A2C):   45.2 ml 32.44 ml/m  RA Area:     9.83 cm LA Vol (A4C):   51.1 ml 36.68 ml/m  RA Volume:   20.20 ml 14.50 ml/m LA Biplane Vol: 50.1 ml 35.96 ml/m  AORTIC VALVE AV Area (Vmax):    1.39 cm AV Area (Vmean):   1.38 cm AV Area (VTI):     1.57 cm AV Vmax:           137.00 cm/s AV Vmean:          102.000 cm/s AV VTI:            0.236 m AV Peak Grad:      7.5 mmHg AV Mean Grad:      5.0 mmHg LVOT Vmax:         83.80 cm/s LVOT Vmean:        62.100 cm/s LVOT VTI:          0.163 m LVOT/AV VTI ratio: 0.69  AORTA Ao Root diam: 2.50 cm MITRAL VALVE                TRICUSPID VALVE MV Area (PHT): 4.17 cm     TR Peak grad:   27.9 mmHg MV Decel Time: 182 msec     TR Vmax:         264.00 cm/s MV E velocity: 116.00 cm/s MV A velocity: 75.50 cm/s   SHUNTS MV E/A ratio:  1.54         Systemic VTI:  0.16 m                             Systemic Diam: 1.70 cm Jerel Croitoru MD Electronically signed by Jerel Balding MD Signature Date/Time: 04/20/2024/12:38:11 PM    Final    CARDIAC CATHETERIZATION Result Date: 04/19/2024   Ost LAD to Prox LAD lesion is 100% stenosed. Late presenting MI secondary to occlusion of the proximal LAD stented segment. No obstructive disease in the Circumflex or RCA RA 8 RV 29/6/12 PA 31/16 mean 23 PCWP 15 LV 94/10/21 CO 4.87 L/min CI 3.06 Recommendations: Will not attempt to open LAD as it has likely been occluded for 72 hours. Medical management. Case reviewed with Dr. [place].        Scheduled Meds:  sodium chloride    Intravenous Once   aspirin  EC  81 mg Oral Daily   atorvastatin   80 mg Oral Daily   Chlorhexidine  Gluconate Cloth  6 each Topical Daily   FLUoxetine   30 mg Oral Daily   midodrine   10 mg Oral TID WC   Continuous Infusions:     LOS: 3 days  Time spent: 35 minutes.    Leatrice Chapel, MD  [facility] Pager #: [phone] 7PM-7AM contact night coverage as above

## 2024-04-22 DIAGNOSIS — Z515 Encounter for palliative care: Secondary | ICD-10-CM | POA: Diagnosis not present

## 2024-04-22 DIAGNOSIS — I25110 Atherosclerotic heart disease of native coronary artery with unstable angina pectoris: Secondary | ICD-10-CM

## 2024-04-22 DIAGNOSIS — Z66 Do not resuscitate: Secondary | ICD-10-CM | POA: Diagnosis not present

## 2024-04-22 DIAGNOSIS — Z7189 Other specified counseling: Secondary | ICD-10-CM | POA: Diagnosis not present

## 2024-04-22 DIAGNOSIS — I213 ST elevation (STEMI) myocardial infarction of unspecified site: Secondary | ICD-10-CM | POA: Diagnosis not present

## 2024-04-22 LAB — COMPREHENSIVE METABOLIC PANEL WITH GFR
ALT: 17 U/L (ref 0–44)
AST: 42 U/L — ABNORMAL HIGH (ref 15–41)
Albumin: 2.5 g/dL — ABNORMAL LOW (ref 3.5–5.0)
Alkaline Phosphatase: 112 U/L (ref 38–126)
Anion gap: 12 (ref 5–15)
BUN: 7 mg/dL — ABNORMAL LOW (ref 8–23)
CO2: 18 mmol/L — ABNORMAL LOW (ref 22–32)
Calcium: 8 mg/dL — ABNORMAL LOW (ref 8.9–10.3)
Chloride: 97 mmol/L — ABNORMAL LOW (ref 98–111)
Creatinine, Ser: 0.74 mg/dL (ref 0.44–1.00)
GFR, Estimated: >60 mL/min (ref >60)
Glucose, Bld: 81 mg/dL (ref 70–99)
Potassium: 4.6 mmol/L (ref 3.5–5.1)
Sodium: 127 mmol/L — ABNORMAL LOW (ref 135–145)
Total Bilirubin: 1 mg/dL (ref 0.0–1.2)
Total Protein: 5.1 g/dL — ABNORMAL LOW (ref 6.5–8.1)

## 2024-04-22 LAB — CBC
HCT: 30.8 % — ABNORMAL LOW (ref 36.0–46.0)
Hemoglobin: 10.5 g/dL — ABNORMAL LOW (ref 12.0–15.0)
MCH: 28.3 pg (ref 26.0–34.0)
MCHC: 34.1 g/dL (ref 30.0–36.0)
MCV: 83 fL (ref 80.0–100.0)
Platelets: 283 K/uL (ref 150–400)
RBC: 3.71 MIL/uL — ABNORMAL LOW (ref 3.87–5.11)
RDW: 17.1 % — ABNORMAL HIGH (ref 11.5–15.5)
WBC: 10.3 K/uL (ref 4.0–10.5)
nRBC: 0 % (ref 0.0–0.2)

## 2024-04-22 LAB — LIPOPROTEIN A (LPA): Lipoprotein (a): 21.8 nmol/L (ref <75.0)

## 2024-04-22 MED ORDER — GERHARDT'S BUTT CREAM
TOPICAL_CREAM | Freq: Every day | CUTANEOUS | Status: DC | PRN
  Filled 2024-04-22: qty 60

## 2024-04-22 NOTE — Progress Notes (Signed)
 Advanced Heart Failure Rounding Note  Cardiologist: Alm Clay, MD  AHF MD: Dr. Cherrie Chief Complaint: Chest discomfort Patient Profile   Patient is a 68 y.o. female with history of lung CA s/p chemo/RT, tobacco abuse, CAD s/p anterior STEMI in 2022 with stenting of proximal LAD, HLD,  ICM with EF 30-35%, and failure to thrive. Admitted for late presenting anterior STEMI.  Subjective:    7/18: R/LHC with ok filling pressures and preserved CO. LAD culprit lesion. Medical mgmt. 7/19: 2u RBCs 7/20: hypotensive; GDMT stopped; midodrine  started; Repeat BCx  Lying in bed. Feeling okay this morning. No SOB.   Objective:    Weight Range: 52 kg Body mass index is 23.96 kg/m.   Vital Signs:   Temp:  [96.8 F (36 C)-98.8 F (37.1 C)] 97.6 F (36.4 C) (07/21 0315) Pulse Rate:  [76-100] 83 (07/21 0630) Resp:  [0-35] 24 (07/21 0630) BP: (77-122)/(52-84) 96/63 (07/21 0630) SpO2:  [90 %-99 %] 92 % (07/21 0630) Last BM Date : 04/21/24  Weight change: Filed Weights   04/18/24 1829 04/19/24 0000 04/21/24 0615  Weight: 49.9 kg 48.3 kg 52 kg   Intake/Output:  Intake/Output Summary (Last 24 hours) at 04/22/2024 0700 Last data filed at 04/22/2024 0430 Gross per 24 hour  Intake 95.48 ml  Output 850 ml  Net -754.52 ml    Physical Exam    General: Frail appearing. No distress on RA Cardiac: JVP flat. S1 and S2 present. No murmurs or rub. Extremities: Warm and dry.  No edema.  Neuro: Alert and oriented x3. Affect flat.   Telemetry   SR in 80s (personally reviewed)  Medications:    Scheduled Medications:  sodium chloride    Intravenous Once   aspirin  EC  81 mg Oral Daily   atorvastatin   80 mg Oral Daily   Chlorhexidine  Gluconate Cloth  6 each Topical Daily   FLUoxetine   30 mg Oral Daily   midodrine   10 mg Oral TID WC   Infusions:   PRN Medications: acetaminophen , calcium  carbonate, loperamide , nitroGLYCERIN , OLANZapine , ondansetron  (ZOFRAN ) IV, mouth rinse,  sodium chloride  flush  Assessment/Plan   CAD with prior LAD infarct, late presentation of anterior STEMI Ischemic HFrEF - Occlusion of previously stented proximal LAD, normal filling pressures, EF severely reduced 20-25% with anterior wall motion abnormalities - Euvolemic on exam. No diuretics.  - Continue midodrine  10mg  TID for BP support - GDMT limited by hypotension and midodrine  use.  - Consult to Palliative Care for goals of care discussion given failure to thrive (bed bound and dependent at home, previously looking for SNF placement) - Continue aspirin  daily, holding on P2Y12 given recent suspected GI bleed. Can consider adding back at a later time.   ABLA:  - Noted on arrival, concern for GIB. - s/p 2u RBCs 7/19 - Holding P2Y12; consider adding back at a later time - Given functional status and clinical trajectory could consider outpatient GI consult - hgb remains stable at 9.8 today  ?Bcx: 7/17 + Staph haemolyticus, suspect contaminant.  - Bcx repeated 7/20 - off vancomycin   Failure to thrive:  - Noted on previous clinic visits, incredibly weak and frail - Suspect will need placement, may benefit from hospice referral  Okay to transfer out of the ICU. [facility] to follow. Will not schedule AHF follow up.   Length of Stay: 4  [place] Mara Favero, NP  04/22/2024, 7:00 AM  Advanced Heart Failure Team Pager [phone] (M-F; 7a - 5p)  Please contact [facility] for  night-coverage after hours (5p -7a ) and weekends on [email or web address]

## 2024-04-22 NOTE — Evaluation (Signed)
 Clinical/Bedside Swallow Evaluation Patient Details  Name: Colleen Fleming MRN: [id number] Date of Birth: [birth year]  Today's Date: 04/22/2024 Time: SLP Start Time (ACUTE ONLY): 0935 SLP Stop Time (ACUTE ONLY): 0950 SLP Time Calculation (min) (ACUTE ONLY): 15 min  Past Medical History:  Past Medical History:  Diagnosis Date   Acute ST elevation myocardial infarction (STEMI) involving left anterior descending (LAD) coronary artery (HCC) 01/30/2021   99% thrombotic subtotal occlusion of Prox LAD => (DES PCI LAD).  apical LAD occlusion with distal embolization.  EF estimated 40-45% w/ mid-apical anterior HK; ~ normal LVEDP with SBP 98 mmHg. => Echo EF 20 to 25% with entire anterior wall hypokinesis and apical akinesis.   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  GR 2 DD.   COPD (chronic obstructive pulmonary disease) (HCC)    Long-term smoker greater than 35 years 1 to 2 pack a day.   Coronary artery disease involving native coronary artery of native heart with unstable angina pectoris (HCC)    Presented with anterior STEMI-99% subtotal occluded proximal LAD-DES PCI (Synergy DES 2.5 mm 60 mm - 2.8 mm) distally apical thromboembolism.  Reduced EF with anterior and apical hypokinesis.   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  On midodrine  for blood pressure support.  Previously not able to tolerate beta-blocker or other CHF medications.   Ischemic cardiomyopathy 08/16/2021   EF 20 to 25% following anterior STEMI, despite LAD PCI.SABRA  Unable to titrate medications due to hypotension.   Obesity    Panlobular emphysema (HCC)    Seizure (HCC)  08/11/2013   Small cell lung cancer, left upper lobe (HCC) 2014   Treated with radiation and chemotherapy [facility])   Past Surgical History:  Past Surgical History:  Procedure Laterality Date   CORONARY/GRAFT ACUTE MI REVASCULARIZATION N/A 01/30/2021   Procedure: Coronary/Graft Acute MI Revascularization;  Surgeon: Anner Alm ORN, MD;  Location: [facility] INVASIVE CV LAB;  Service: Cardiovascular; Prox LAD 99% (-> 0%) DES PCI (Synergy DES 2.5 mm x 16 mm - 2.16mm) -> distal embolization with apical 80% occlusion   ICD IMPLANT N/A 11/19/2021   Procedure: ICD IMPLANT;  Surgeon: Waddell Danelle ORN, MD;  Location: [facility] INVASIVE CV LAB;  Service: Cardiovascular;  Laterality: N/A;   LEFT HEART CATH AND CORONARY ANGIOGRAPHY N/A 01/30/2021   Procedure: LEFT HEART CATH AND CORONARY ANGIOGRAPHY;  Surgeon: Anner Alm ORN, MD;  Location[facility] INVASIVE CV LAB;  Service: Cardiovascular; ANT STEMI: 99% thrombotic subtotal occlusion of Prox LAD => (DES PCI LAD). Otherwise angiographically normal coronaries with exception of apical LAD occlusion with distal embolization.  EF estimated 45% w/ mid-apical anterior HK; ~ nl LVEDP w/  SBP 98 mmHg.   RIGHT HEART CATH N/A 02/15/2021   Procedure: RIGHT HEART CATH;  Surgeon: Cherrie Toribio SAUNDERS, MD;  Location: [facility] INVASIVE CV LAB;  Service: CardiovRA = 2 mmHg; RV = 17/2 mmHg, PA = 17/4 (8) mmHg; PCW = 4 mmHg;   Ao sat = 98%, PA sat = 64%, 65%Fick Cardiac Output/Index = 3.0/2.4; Thermo CO/CI = 2.8/2.2; PVR = 1.3 [employer];   RIGHT/LEFT HEART CATH AND CORONARY ANGIOGRAPHY N/A  04/19/2024   Procedure: RIGHT/LEFT HEART CATH AND CORONARY ANGIOGRAPHY;  Surgeon: Verlin Lonni BIRCH, MD;  Location: [facility] INVASIVE CV LAB;  Service: Cardiovascular;  Laterality: N/A;   TRANSTHORACIC ECHOCARDIOGRAM  01/31/2021   (post Ant STEMI -PCI): EF 25-30%. Severe HK of entire Anterior/Anteroseptal wall & mild dyskinesis of anteroapical/apical segment. Gr 2 DD.  Normal RV size and function with mildly  elevated.  RAP estimated 8 mm.  Small circumferential pericardial effusion.  Normal aortic and mitral valves.   TRANSTHORACIC ECHOCARDIOGRAM  02/01/2021   a) Limited: small-mod pericardial effusion primarily anterior to RV. IVC collapses ~50%. Nl RV & RAP. NO OVERT TAMPONADE; b) 5/14: Large, predominantly anterior pericardial effusion -> findings suggestive of early tamponade physiology, but no RV diastolic collapse and IVC not dilated.  RAP 3 mmHg. = EFFUSION LARGER, no TAMPONADE   HPI:  68 y.o. female with FTT, bedbound, dependent at home, admitted with STEMI. PMHx coronary artery disease, CHF, emphysema, GERD, anxiety, depression, obesity and small cell lung cancer. Palliative care has been consulted.    Assessment / Plan / Recommendation  Clinical Impression  Pt presents with a functional oropharyngeal swallow. The mechanics of swallowing appear to be WNL - mastication is thorough despite absence of teeth; there are no focal CN deficits. She drank sequential sips of water, often with crackers in her mouth, with no coughing. Her cued cough is strong/crisp.  Primary obstacles to eating are positioning and dependence on others for set-up.  In the future, being bed-bound and frail put her at higher risk for developing a pna in the context of aspiration, but at this time, she does not appear to have dysphagia.  Recommend continuing a HH diet, thin liquids. Provide support for tray set-up and position upright at meals. No SLP f/u is needed. Will sign off.  SLP Visit Diagnosis: Dysphagia, unspecified (R13.10)    Aspiration Risk  No limitations    Diet Recommendation   Thin;Age appropriate regular  Medication Administration: Whole meds with liquid    Other  Recommendations Oral Care Recommendations: Oral care BID       Swallow Study   General Date of Onset: 04/19/24 HPI: 68 y.o. female with FTT, bedbound, dependent at home, admitted with STEMI. PMHx coronary artery disease, CHF, emphysema,  GERD, anxiety, depression, obesity and small cell lung cancer. Palliative care has been consulted. Type of Study: Bedside Swallow Evaluation Previous Swallow Assessment: no Diet Prior to this Study: Regular;Thin liquids (Level 0) Temperature Spikes Noted: No Respiratory Status: Room air History of Recent Intubation: No Behavior/Cognition: Alert;Cooperative Oral Cavity Assessment: Within Functional Limits Oral Care Completed by SLP: No Oral Cavity - Dentition: Edentulous Vision: Functional for self-feeding Self-Feeding Abilities: Needs assist;Needs set up Patient Positioning: Upright in bed Baseline Vocal Quality: Normal Volitional Cough: Strong Volitional Swallow: Able to elicit    Oral/Motor/Sensory Function Overall Oral Motor/Sensory Function: Within functional limits   Ice Chips Ice chips: Within functional limits   Thin Liquid Thin Liquid: Within functional limits    Nectar Thick Nectar Thick Liquid: Not tested   Honey Thick Honey Thick Liquid: Not tested   Puree Puree: Within functional limits   Solid     Solid: Within functional limits      Vona Palma Laurice 04/22/2024,10:02 AM  Palma L. Vona, MA CCC/SLP Clinical Specialist - Acute Care SLP Acute Rehabilitation Services Office number [phone]

## 2024-04-22 NOTE — Progress Notes (Signed)
 PROGRESS NOTE    Colleen Fleming  FMW:[id number] DOB: 05-Dec-1955 DOA: 04/18/2024 PCP: Colleen Virginia  E, Fleming  Outpatient Specialists:     Brief Narrative:  Patient is a 68 year old female past medical history significant for coronary artery disease, CHF, emphysema, GERD, anxiety, depression, obesity and small cell lung cancer.  Patient was admitted with late presentation STEMI, with decreased ejection fraction (20 to 25%).  Cardiology team is directing care.  Patient has not able to tolerate GDMT due to hypotension.  Patient is currently on midodrine  10 Mg p.o. 3 times daily.  DAPT is also on hold due to GI bleed.  Patient was transfused 2 units of packed red blood cells, and hemoglobin has remained stable.  Palliative care team has been consulted.  Medical team was consulted to assist with anemia and medical management.    04/22/2024: Patient seen.  No new complaints.  Denies chest pain.  Lying quietly in bed.  Assessment & Plan:   Active Problems:   GERD (gastroesophageal reflux disease)   Hyperlipidemia with target LDL less than 70   Anxiety as acute reaction to exceptional stress   Coronary artery disease involving native coronary artery of native heart with unstable angina pectoris (HCC)   Hyponatremia   Chronic combined systolic and diastolic CHF, NYHA class 2 (HCC)   STEMI (ST elevation myocardial infarction) (HCC)   Positive blood culture   STEMI: - Late presentation. - Cardiology team is directing care. - Due to hypotension, GDMT is on hold.  Patient is on midodrine  10 Mg p.o. 3 times daily.   - Palliative care consult has been considered.  Anemia, likely iron deficiency anemia: - Status post transfusion of 2 packed red blood cells. - Posttransfusion hemoglobin 9.8 g/dL. - Plavix  is on hold. - Continue to monitor H/H clinically..  Hyperlipidemia: - Continue atorvastatin .  Anxiety: Depression: - Continue fluoxetine  and olanzapine .  Positive blood culture: - 1  out of 4 cultures is growing Staphylococcus hemolyticus (likely contaminant) - IV vancomycin  has been discontinued.     Guarded prognosis.  DVT prophylaxis: SCD. Code Status: Full code. Family Communication:  Disposition Plan: Inpatient.   Procedures:  None.  Antimicrobials:  IV vancomycin  discontinued.  .   Subjective: Poor historian. No chest pain.  Objective: Vitals:   04/22/24 0630 04/22/24 0700 04/22/24 0800 04/22/24 0900  BP: 96/63 97/75 98/70  94/64  Pulse: 83 85 79 82  Resp: (!) 24 (!) 25 (!) 23 (!) 26  Temp:  98.1 F (36.7 C)    TempSrc:  Oral    SpO2: 92% 92% 90% 91%  Weight:      Height:        Intake/Output Summary (Last 24 hours) at 04/22/2024 0946 Last data filed at 04/22/2024 0430 Gross per 24 hour  Intake 95.48 ml  Output 850 ml  Net -754.52 ml   Filed Weights   04/18/24 1829 04/19/24 0000 04/21/24 0615  Weight: 49.9 kg 48.3 kg 52 kg    Examination:  General exam: Chronically ill looking.  Not in distress.  Looks pale.    Respiratory system: Clear to auscultation.  Cardiovascular system: S1 & S2 heard Gastrointestinal system: Abdomen is soft and nontender.   Central nervous system: Awake and alert.    Data Reviewed: I have personally reviewed following labs and imaging studies  CBC: Recent Labs  Lab 04/18/24 1839 04/18/24 1855 04/19/24 0340 04/19/24 1555 04/19/24 1558 04/21/24 1000 04/21/24 1001  WBC 17.7*  --  16.1*  --   --  9.3  --   NEUTROABS 14.8*  --   --   --   --  7.1  --   HGB 9.1*   < > 8.0* 7.1* 7.1* 9.8* 9.8*  HCT 28.0*   < > 24.0* 21.0* 21.0* 29.0* 29.5*  MCV 85.9  --  84.2  --   --  83.6  --   PLT 289  --  245  --   --  251  --    < > = values in this interval not displayed.   Basic Metabolic Panel: Recent Labs  Lab 04/18/24 1839 04/18/24 1855 04/19/24 1558 04/20/24 0407 04/20/24 1203 04/21/24 1000 04/21/24 1001 04/22/24 0412  NA 127*   < > 129* 125*  --  128* 129* 127*  K 3.8   < > 3.9 3.4*  --  4.3  4.4 4.6  CL 92*  --   --  97*  --  98 99 97*  CO2 21*  --   --  20*  --  22 23 18*  GLUCOSE 129*  --   --  96  --  89 87 81  BUN 9  --   --  6*  --  7* 7* 7*  CREATININE 0.76  --   --  0.85  --  0.84 0.76 0.74  CALCIUM  8.7*  --   --  7.8*  --  8.6* 8.6* 8.0*  MG 1.7  --   --   --  1.9 1.6*  --   --   PHOS  --   --   --   --   --   --  2.8  --    < > = values in this interval not displayed.   GFR: Estimated Creatinine Clearance: 49.5 mL/min (by C-G formula based on SCr of 0.74 mg/dL). Liver Function Tests: Recent Labs  Lab 04/18/24 1839 04/20/24 0407 04/21/24 1000 04/21/24 1001 04/22/24 0412  AST 291* 99* 47*  --  42*  ALT 50* 30 23  --  17  ALKPHOS 88 69 107  --  112  BILITOT 0.5 0.6 1.1  --  1.0  PROT 6.4* 5.2* 5.8*  --  5.1*  ALBUMIN  3.2* 2.3* 2.9* 2.9* 2.5*   Recent Labs  Lab 04/18/24 1839  LIPASE 21   Recent Labs  Lab 04/18/24 2000  AMMONIA 16   Coagulation Profile: Recent Labs  Lab 04/18/24 1839  INR 1.1   Cardiac Enzymes: No results for input(s): CKTOTAL, CKMB, CKMBINDEX, TROPONINI in the last 168 hours. BNP (last 3 results) No results for input(s): PROBNP in the last 8760 hours. HbA1C: No results for input(s): HGBA1C in the last 72 hours.  CBG: Recent Labs  Lab 04/20/24 1127  GLUCAP 143*   Lipid Profile: No results for input(s): CHOL, HDL, LDLCALC, TRIG, CHOLHDL, LDLDIRECT in the last 72 hours.  Thyroid Function Tests: No results for input(s): TSH, T4TOTAL, FREET4, T3FREE, THYROIDAB in the last 72 hours. Anemia Panel: Recent Labs    04/19/24 1006  VITAMINB12 251  FOLATE 16.1  FERRITIN 21  TIBC 342  IRON 11*  RETICCTPCT 2.1   Urine analysis:    Component Value Date/Time   COLORURINE YELLOW 04/20/2024 1048   APPEARANCEUR CLEAR 04/20/2024 1048   LABSPEC 1.014 04/20/2024 1048   PHURINE 6.0 04/20/2024 1048   GLUCOSEU NEGATIVE 04/20/2024 1048   HGBUR NEGATIVE 04/20/2024 1048   BILIRUBINUR NEGATIVE  04/20/2024 1048   KETONESUR 5 (A) 04/20/2024 1048   PROTEINUR  NEGATIVE 04/20/2024 1048   NITRITE NEGATIVE 04/20/2024 1048   LEUKOCYTESUR NEGATIVE 04/20/2024 1048   Sepsis Labs: @LABRCNTIP (procalcitonin:4,lacticidven:4)  ) Recent Results (from the past 240 hours)  Resp panel by RT-PCR (RSV, Flu A&B, Covid) Anterior Nasal Swab     Status: None   Collection Time: 04/18/24  6:42 PM   Specimen: Anterior Nasal Swab  Result Value Ref Range Status   SARS Coronavirus 2 by RT PCR NEGATIVE NEGATIVE Final   Influenza A by PCR NEGATIVE NEGATIVE Final   Influenza B by PCR NEGATIVE NEGATIVE Final    Comment: (NOTE) The Xpert Xpress SARS-CoV-2/FLU/RSV plus assay is intended as an aid in the diagnosis of influenza from Nasopharyngeal swab specimens and should not be used as a sole basis for treatment. Nasal washings and aspirates are unacceptable for Xpert Xpress SARS-CoV-2/FLU/RSV testing.  Fact Sheet for Patients: BloggerCourse.com  Fact Sheet for Healthcare Providers: SeriousBroker.it  This test is not yet approved or cleared by the [place]  FDA and has been authorized for detection and/or diagnosis of SARS-CoV-2 by FDA under an Emergency Use Authorization (EUA). This EUA will remain in effect (meaning this test can be used) for the duration of the COVID-19 declaration under Section 564(b)(1) of the Act, 21 U.S.C. section 360bbb-3(b)(1), unless the authorization is terminated or revoked.     Resp Syncytial Virus by PCR NEGATIVE NEGATIVE Final    Comment: (NOTE) Fact Sheet for Patients: BloggerCourse.com  Fact Sheet for Healthcare Providers: SeriousBroker.it  This test is not yet approved or cleared by the [place]  FDA and has been authorized for detection and/or diagnosis of SARS-CoV-2 by FDA under an Emergency Use Authorization (EUA). This EUA will remain in effect  (meaning this test can be used) for the duration of the COVID-19 declaration under Section 564(b)(1) of the Act, 21 U.S.C. section 360bbb-3(b)(1), unless the authorization is terminated or revoked.  Performed at [facility], [address], [place], [place] [address]   Blood Culture (routine x 2)     Status: None (Preliminary result)   Collection Time: 04/18/24  6:42 PM   Specimen: BLOOD  Result Value Ref Range Status   Specimen Description BLOOD RIGHT ANTECUBITAL  Final   Special Requests   Final    BOTTLES DRAWN AEROBIC AND ANAEROBIC Blood Culture results may not be optimal due to an inadequate volume of blood received in culture bottles   Culture   Final    NO GROWTH 4 DAYS Performed at [facility], [address], [place], [place] [address]    Report Status PENDING  Incomplete  Blood Culture (routine x 2)     Status: Abnormal   Collection Time: 04/18/24  6:47 PM   Specimen: BLOOD  Result Value Ref Range Status   Specimen Description BLOOD LEFT ANTECUBITAL  Final   Special Requests   Final    BOTTLES DRAWN AEROBIC AND ANAEROBIC Blood Culture results may not be optimal due to an inadequate volume of blood received in culture bottles   Culture  Setup Time   Final    GRAM POSITIVE COCCI IN CLUSTERS IN BOTH AEROBIC AND ANAEROBIC BOTTLES CRITICAL RESULT CALLED TO, READ BACK BY AND VERIFIED WITH: PHARMD ANDREW MEYER ON 04/19/24 @ 1600 BY DRT    Culture (A)  Final    STAPHYLOCOCCUS HAEMOLYTICUS THE SIGNIFICANCE OF ISOLATING THIS ORGANISM FROM A SINGLE SET OF BLOOD CULTURES WHEN MULTIPLE SETS ARE DRAWN IS UNCERTAIN. PLEASE NOTIFY THE MICROBIOLOGY DEPARTMENT WITHIN ONE WEEK IF  SPECIATION AND SENSITIVITIES ARE REQUIRED. Performed at [facility], [address] [place], [place] [address]    Report Status 04/21/2024 FINAL  Final  Blood Culture ID Panel (Reflexed)     Status: Abnormal   Collection Time: 04/18/24  6:47 PM  Result Value Ref Range Status    Enterococcus faecalis NOT DETECTED NOT DETECTED Final   Enterococcus Faecium NOT DETECTED NOT DETECTED Final   Listeria monocytogenes NOT DETECTED NOT DETECTED Final   Staphylococcus species DETECTED (A) NOT DETECTED Final    Comment: CRITICAL RESULT CALLED TO, READ BACK BY AND VERIFIED WITH: PHARMD ANDREW MEYER ON 04/19/24 @ 1600 BY DRT    Staphylococcus aureus (BCID) NOT DETECTED NOT DETECTED Final   Staphylococcus epidermidis NOT DETECTED NOT DETECTED Final   Staphylococcus lugdunensis NOT DETECTED NOT DETECTED Final   Streptococcus species NOT DETECTED NOT DETECTED Final   Streptococcus agalactiae NOT DETECTED NOT DETECTED Final   Streptococcus pneumoniae NOT DETECTED NOT DETECTED Final   Streptococcus pyogenes NOT DETECTED NOT DETECTED Final   A.calcoaceticus-baumannii NOT DETECTED NOT DETECTED Final   Bacteroides fragilis NOT DETECTED NOT DETECTED Final   Enterobacterales NOT DETECTED NOT DETECTED Final   Enterobacter cloacae complex NOT DETECTED NOT DETECTED Final   Escherichia coli NOT DETECTED NOT DETECTED Final   Klebsiella aerogenes NOT DETECTED NOT DETECTED Final   Klebsiella oxytoca NOT DETECTED NOT DETECTED Final   Klebsiella pneumoniae NOT DETECTED NOT DETECTED Final   Proteus species NOT DETECTED NOT DETECTED Final   Salmonella species NOT DETECTED NOT DETECTED Final   Serratia marcescens NOT DETECTED NOT DETECTED Final   Haemophilus influenzae NOT DETECTED NOT DETECTED Final   Neisseria meningitidis NOT DETECTED NOT DETECTED Final   Pseudomonas aeruginosa NOT DETECTED NOT DETECTED Final   Stenotrophomonas maltophilia NOT DETECTED NOT DETECTED Final   Candida albicans NOT DETECTED NOT DETECTED Final   Candida auris NOT DETECTED NOT DETECTED Final   Candida glabrata NOT DETECTED NOT DETECTED Final   Candida krusei NOT DETECTED NOT DETECTED Final   Candida parapsilosis NOT DETECTED NOT DETECTED Final   Candida tropicalis NOT DETECTED NOT DETECTED Final   Cryptococcus  neoformans/gattii NOT DETECTED NOT DETECTED Final    Comment: Performed at [facility], [address] [place], [place] [address]  Culture, blood (Routine X 2) w Reflex to ID Panel     Status: None (Preliminary result)   Collection Time: 04/19/24  6:15 PM   Specimen: BLOOD LEFT HAND  Result Value Ref Range Status   Specimen Description BLOOD LEFT HAND  Final   Special Requests   Final    BOTTLES DRAWN AEROBIC AND ANAEROBIC Blood Culture adequate volume   Culture   Final    NO GROWTH 3 DAYS Performed at [facility], [address], [place], [place] [address]    Report Status PENDING  Incomplete  Culture, blood (Routine X 2) w Reflex to ID Panel     Status: None (Preliminary result)   Collection Time: 04/21/24 12:50 PM   Specimen: BLOOD LEFT ARM  Result Value Ref Range Status   Specimen Description BLOOD LEFT ARM  Final   Special Requests   Final    BOTTLES DRAWN AEROBIC ONLY Blood Culture results may not be optimal due to an inadequate volume of blood received in culture bottles   Culture   Final    NO GROWTH < 24 HOURS Performed at [facility], [address], [place], [place] [address]  Report Status PENDING  Incomplete  Culture, blood (Routine X 2) w Reflex to ID Panel     Status: None (Preliminary result)   Collection Time: 04/21/24 12:50 PM   Specimen: BLOOD LEFT ARM  Result Value Ref Range Status   Specimen Description BLOOD LEFT ARM  Final   Special Requests   Final    BOTTLES DRAWN AEROBIC ONLY Blood Culture results may not be optimal due to an inadequate volume of blood received in culture bottles   Culture   Final    NO GROWTH < 24 HOURS Performed at [facility], [address] [place], [place] [address]    Report Status PENDING  Incomplete         Radiology Studies: ECHOCARDIOGRAM COMPLETE Result Date: 04/20/2024    ECHOCARDIOGRAM REPORT   Patient Name:   Colleen Fleming Date of Exam: 04/20/2024 Medical Rec #:  [id number]     Height:        58.0 in Accession #:    [id number]    Weight:       106.5 lb Date of Birth:  1956-07-22     BSA:          1.393 m Patient Age:    68 years      BP:           65/53 mmHg Patient Gender: F             HR:           99 bpm. Exam Location:  Inpatient Procedure: 2D Echo, Cardiac Doppler and Color Doppler (Both Spectral and Color            Flow Doppler were utilized during procedure). Indications:    Acute myocardial infarction, unspecified I21.9  History:        Patient has prior history of Echocardiogram examinations, most                 recent 08/23/2021. CHF and Cardiomyopathy; CAD and Previous                 Myocardial Infarction.  Sonographer:    Jayson Gaskins Referring Phys: 68 CHRISTOPHER D MCALHANY IMPRESSIONS  1. No left ventricular thrombus is seen.. Left ventricular ejection fraction, by estimation, is 20 to 25%. The left ventricle has severely decreased function. The left ventricle demonstrates regional wall motion abnormalities (see scoring diagram/findings for description). The left ventricular internal cavity size was mildly dilated. Left ventricular diastolic parameters are consistent with Grade II diastolic dysfunction (pseudonormalization). Elevated left atrial pressure. There is mild dyskinesis of the left ventricular, entire apical segment.  2. Right ventricular systolic function is normal. The right ventricular size is normal.  3. Left atrial size was moderately dilated.  4. The mitral valve is normal in structure. Mild mitral valve regurgitation. No evidence of mitral stenosis.  5. Tricuspid valve regurgitation is moderate.  6. The aortic valve is tricuspid. Aortic valve regurgitation is not visualized. No aortic stenosis is present. Comparison(s): Prior images reviewed side by side. The left ventricular function is worsened. The left ventricular wall motion abnormalities are worse. FINDINGS  Left Ventricle: No left ventricular thrombus is seen. Left ventricular ejection fraction, by  estimation, is 20 to 25%. The left ventricle has severely decreased function. The left ventricle demonstrates regional wall motion abnormalities. Mild dyskinesis of the left ventricular, entire apical segment. The left ventricular internal cavity size was mildly dilated. There is no left ventricular hypertrophy. Left ventricular  diastolic parameters are consistent with Grade II diastolic dysfunction (pseudonormalization). Elevated left atrial pressure.  LV Wall Scoring: The apical lateral segment, apical septal segment, and apex are dyskinetic. The mid and distal anterior wall, mid anteroseptal segment, mid anterolateral segment, mid inferoseptal segment, and apical inferior segment are akinetic. The inferior wall, posterior wall, basal anteroseptal segment, basal anterior segment, and basal inferoseptal segment are normal. Right Ventricle: The right ventricular size is normal. No increase in right ventricular wall thickness. Right ventricular systolic function is normal. Left Atrium: Left atrial size was moderately dilated. Right Atrium: Right atrial size was normal in size. Pericardium: There is no evidence of pericardial effusion. Mitral Valve: The mitral valve is normal in structure. Mild mitral valve regurgitation. No evidence of mitral valve stenosis. Tricuspid Valve: The tricuspid valve is normal in structure. Tricuspid valve regurgitation is moderate. Aortic Valve: The aortic valve is tricuspid. Aortic valve regurgitation is not visualized. No aortic stenosis is present. Aortic valve mean gradient measures 5.0 mmHg. Aortic valve peak gradient measures 7.5 mmHg. Aortic valve area, by VTI measures 1.57 cm. Pulmonic Valve: The pulmonic valve was grossly normal. Pulmonic valve regurgitation is trivial. No evidence of pulmonic stenosis. Aorta: The aortic root is normal in size and structure. IAS/Shunts: No atrial level shunt detected by color flow Doppler. Additional Comments: A device lead is visualized in the  right ventricle and right atrium.  LEFT VENTRICLE PLAX 2D LVIDd:         4.10 cm   Diastology LV PW:         1.00 cm   LV e' medial:    6.85 cm/s LV IVS:        0.70 cm   LV E/e' medial:  16.9 LVOT diam:     1.70 cm   LV e' lateral:   10.00 cm/s LV SV:         37        LV E/e' lateral: 11.6 LV SV Index:   27 LVOT Area:     2.27 cm  RIGHT VENTRICLE RV S prime:     14.40 cm/s TAPSE (M-mode): 1.6 cm LEFT ATRIUM             Index        RIGHT ATRIUM          Index LA Vol (A2C):   45.2 ml 32.44 ml/m  RA Area:     9.83 cm LA Vol (A4C):   51.1 ml 36.68 ml/m  RA Volume:   20.20 ml 14.50 ml/m LA Biplane Vol: 50.1 ml 35.96 ml/m  AORTIC VALVE AV Area (Vmax):    1.39 cm AV Area (Vmean):   1.38 cm AV Area (VTI):     1.57 cm AV Vmax:           137.00 cm/s AV Vmean:          102.000 cm/s AV VTI:            0.236 m AV Peak Grad:      7.5 mmHg AV Mean Grad:      5.0 mmHg LVOT Vmax:         83.80 cm/s LVOT Vmean:        62.100 cm/s LVOT VTI:          0.163 m LVOT/AV VTI ratio: 0.69  AORTA Ao Root diam: 2.50 cm MITRAL VALVE                TRICUSPID VALVE MV  Area (PHT): 4.17 cm     TR Peak grad:   27.9 mmHg MV Decel Time: 182 msec     TR Vmax:        264.00 cm/s MV E velocity: 116.00 cm/s MV A velocity: 75.50 cm/s   SHUNTS MV E/A ratio:  1.54         Systemic VTI:  0.16 m                             Systemic Diam: 1.70 cm Mihai Croitoru MD Electronically signed by Jerel Balding MD Signature Date/Time: 04/20/2024/12:38:11 PM    Final         Scheduled Meds:  sodium chloride    Intravenous Once   aspirin  EC  81 mg Oral Daily   atorvastatin   80 mg Oral Daily   Chlorhexidine  Gluconate Cloth  6 each Topical Daily   FLUoxetine   30 mg Oral Daily   midodrine   10 mg Oral TID WC   Continuous Infusions:     LOS: 4 days    Time spent: 35 minutes.    Leatrice Chapel, MD  [facility] Pager #: [phone] 7PM-7AM contact night coverage as above

## 2024-04-22 NOTE — TOC Initial Note (Addendum)
 Transition of Care [facility]) - Initial/Assessment Note    Patient Details  Name: Colleen Fleming MRN: [id number] Date of Birth: 12-11-1955  Transition of Care [facility]) CM/SW Contact:    Justina Delcia Czar, RN Phone Number: [phone] 04/22/2024, 1:08 PM  Clinical Narrative:                 Spoke to pt at bedside. Gave permission to speak with son, [name]. States he lives at home with son. States she has a wheelchair.  Palliative spoke to son and pt will need SNF for rehab vs Hospice.  PT/OT recommendations needed.   Will continue to follow for dc needs.   Expected Discharge Plan: Skilled Nursing Facility Barriers to Discharge: Continued Medical Work up   Patient Goals and CMS Choice Patient states their goals for this hospitalization and ongoing recovery are:: wants to get better          Expected Discharge Plan and Services In-house Referral: Clinical Social Work Discharge Planning Services: CM Consult   Living arrangements for the past 2 months: Single Family Home                                      Prior Living Arrangements/Services Living arrangements for the past 2 months: Single Family Home Lives with:: Adult Children Patient language and need for interpreter reviewed:: Yes Do you feel safe going back to the place where you live?: Yes      Need for Family Participation in Patient Care: Yes (Comment) Care giver support system in place?: No (comment) Current home services: DME (wheelchair) Criminal Activity/Legal Involvement Pertinent to Current Situation/Hospitalization: No - Comment as needed  Activities of Daily Living   ADL Screening (condition at time of admission) Independently performs ADLs?: No Does the patient have a NEW difficulty with bathing/dressing/toileting/self-feeding that is expected to last >3 days?: No Does the patient have a NEW difficulty with getting in/out of bed, walking, or climbing stairs that is expected to last >3 days?: No Does  the patient have a NEW difficulty with communication that is expected to last >3 days?: No  Permission Sought/Granted Permission sought to share information with : Case Manager, Family Supports, PCP Permission granted to share information with : Yes, Verbal Permission Granted  Share Information with NAME: Waleska Buttery  Permission granted to share info w AGENCY: Home Hospice, Home Health, DME, SNF  Permission granted to share info w Relationship: son  Permission granted to share info w Contact Information: [phone]  Emotional Assessment Appearance:: Appears stated age Attitude/Demeanor/Rapport: Guarded Affect (typically observed): Guarded Orientation: : Oriented to Self, Oriented to Place, Oriented to  Time, Oriented to Situation   Psych Involvement: No (comment)  Admission diagnosis:  ST elevation [R94.31] STEMI (ST elevation myocardial infarction) (HCC) [I21.3] Troponin level elevated [R79.89] Patient Active Problem List   Diagnosis Date Noted   Positive blood culture 04/19/2024   STEMI (ST elevation myocardial infarction) (HCC) 04/18/2024   Persistent depressive disorder 05/18/2022   Hypotension (arterial) 08/16/2021   Ischemic cardiomyopathy 08/16/2021   Fall    Chronic combined systolic and diastolic CHF, NYHA class 2 (HCC) 02/13/2021   Hyponatremia 02/10/2021   Pericardial effusion 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 [facility]) 02/01/2021   Acute ST elevation myocardial infarction (STEMI) involving left anterior descending (LAD) coronary artery (HCC) 01/30/2021   Hypokalemia    Panlobular emphysema (HCC)    Coronary artery disease involving  native coronary artery of native heart with unstable angina pectoris (HCC)    Obesity 01/03/2015   Small cell lung cancer (HCC) 08/21/2013   GERD (gastroesophageal reflux disease) 11/25/2011   Nicotine abuse 11/25/2011   Hyperlipidemia with target LDL less than 70 11/25/2011   IBS (irritable bowel syndrome) 11/25/2011   Anxiety as acute  reaction to exceptional stress 11/25/2011   PCP:  Boneta Colleen BRAVO, [name] Pharmacy:   [facility] [phone] [address], [place] - [address] [place] [address] Phone: [phone] Fax: [phone]  [facility] - [address] [place] - [address] [place] [address] Phone: [phone] Fax: [phone]     Social Drivers of Health (SDOH) Social History: SDOH Screenings   Food Insecurity: No Food Insecurity (04/20/2024)  Housing: Low Risk  (04/20/2024)  Transportation Needs: Unknown (04/20/2024)  Utilities: Not At Risk (04/20/2024)  Financial Resource Strain: Low Risk  (02/18/2021)  Tobacco Use: High Risk (04/18/2024)   SDOH Interventions:     Readmission Risk Interventions    06/07/2022    1:39 PM  Readmission Risk Prevention Plan  Post Dischage Appt Complete  Medication Screening Complete  Transportation Screening Complete

## 2024-04-22 NOTE — Consult Note (Signed)
 Palliative Medicine Inpatient Consult Note  Consulting Provider:  Lee, [place], NP   Reason for consult:   Palliative Care Consult Services Palliative Medicine Consult  Reason for Consult? goals of care   04/22/2024  HPI:  Per intake H&P --> Patient is a 68 year old female past medical history significant for coronary artery disease, CHF, emphysema, GERD, anxiety, depression, obesity and small cell lung cancer.  Patient was admitted with STEMI, mild presentation, with decreased ejection fraction (20 to 25%). Palliative care has been asked to support additional goals of care conversations.   Clinical Assessment/Goals of Care:  *Please note that this is a verbal dictation therefore any spelling or grammatical errors are due to the Dragon Medical One system interpretation.  I have reviewed medical records including EPIC notes, labs and imaging, received report from bedside RN, assessed the patient who is lying in bed in NAD.    I met with Anneka Studer to further discuss diagnosis prognosis, GOC, EOL wishes, disposition and options.   I introduced Palliative Medicine as specialized medical care for people living with serious illness. It focuses on providing relief from the symptoms and stress of a serious illness. The goal is to improve quality of life for both the patient and the family.  Medical History Review and Understanding:  A review of Shaqueena's past medical history significant for small cell lung cancer identified to be in remission 4 years ago, heart failure, coronary artery disease status post 2 heart attacks, COPD GERD, anxiety, & depression was completed.  Social History:  Mahoganie shares that she is from [place] .  She is divorced.  She has 2 sons.  She has a granddaughter who is 58 years old who lives in [place] .  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  She is a woman of faith practicing within the [facility] [employer] [employer] denomination.  Functional and Nutritional  State:  Samauri had been doing well up into her STEMI in 2022 after which time she had become more depleted in energy and more dependent for all B ADLs.  She has been living with her son [name] since New Year's Eve of this year and for the past 7 months has had minimal mobility.  Preceding that she was living at an assisted living [facility] though due to her increased care needs they were unable to care for her.  Since then [name] has been seeking placement.  Daeja has had and continues to have a depleting appetite contributing to weight loss.  Advance Directives:  A detailed discussion was had today regarding advanced directives.  Patient's son, [name] is her Social research officer, government.  Code Status:  Concepts specific to code status, artifical feeding and hydration, continued IV antibiotics and rehospitalization was had.  The difference between a aggressive medical intervention path  and a palliative comfort care path for this patient at this time was had.   Encouraged patient/family to consider DNR/DNI status understanding evidenced based poor outcomes in similar hospitalized patient, as the cause of arrest is likely associated with advanced chronic/terminal illness rather than an easily reversible acute cardio-pulmonary event. I explained that DNR/DNI does not change the medical plan and it only comes into effect after a person has arrested (died).  It is a protective measure to keep us  from harming the patient in their last moments of life.  Dharma was agreeable to DNR/DNI with understanding that patient would not receive CPR, defibrillation, ACLS medications, or intubation.   Discussion:  Dannie shares understanding  of hospitalization in the setting of her having a heart attack.  She understands the severity of her disease and possibly long-term effects she may endure from this.  We reviewed her previous heart attack and how that had a profound effect on her day-to-day activities.  I  shared that this second heart attack would likely have a similar effect.  A review of patient's acute on chronic disease was held as well as her declined functional state.  We discussed options moving forward inclusive of her going to a rehabilitation facility.  Patient is rather indifferent when asked her goals.  We discussed hospice as consideration which would enable her to stay in a comfortable environment and have symptoms addressed in that environment without traveling back and forth to the hospital.   She is agreeable to me calling her son to ascertain additional information. ________________________ Addendum:  I called and spoke with patient's son [name] this morning.  He shares he has been caring for her since New Year's Eve and over time her level of disability has become difficult for him to provide care for.  He shares everything came to ahead when they were at [facility] last week and she declined.  He shares despite constant encouragement for Reginae to stop smoking she continues to engage in this habit.  He notes he is unable to change her choices and does understand her ongoing risk of further events.  We reviewed the chronic and progressive nature of congestive heart failure as well as COPD.  We reviewed the use of medical management to inhibit disease progression though I emphasized there is no curative treatment, and over time despite the best efforts disease progression occurs.  We reviewed that years ago Teri was on hospice with [facility] though she was too functional and ended up graduating from services.  [name] is open to hospice services again if this is what is recommended by the medical team.  [name] shares understanding that his mother does not want resuscitative efforts and wants to honor that wish.  [name] shares that his biggest hope is that Janetta can find a safe place to live and be cared for.  We discussed that social work would reach out to him to further  delineate a plan moving forward.  Discussed the importance of continued conversation with family and their  medical providers regarding overall plan of care and treatment options, ensuring decisions are within the context of the patients values and GOCs.  Decision Maker: Kynnadi, Dicenso (Son): [phone] (Mobile)   SUMMARY OF RECOMMENDATIONS   DNAR/DNI  Open and honest conversations held in the setting of patient's coronary artery disease in addition to other chronic comorbid conditions inclusive of COPD and heart failure  Reviewed palliative care versus hospice care  Patient does not share any overt objectives though her son notes wanting to get her safe housing --> I have requested medical social work follow-up. Preference towards [facility] in [place]  Ongoing palliative care support as needed  Code Status/Advance Care Planning: DNAR/DNI  Palliative Prophylaxis:  Aspiration, Bowel Regimen, Delirium Protocol, Frequent Pain Assessment, Oral Care, Palliative Wound Care, and Turn Reposition  Additional Recommendations (Limitations, Scope, Preferences): Continue current care  Psycho-social/Spiritual:  Desire for further Chaplaincy support: Patient is [facility] Additional Recommendations: Education on chronic disease trajectory   Prognosis: Patient has increased frailty, dependence, adult failure to thrive placing her at a high 19-month mortality risk.  Discharge Planning: Discharge plan to be determined.  Vitals:   04/22/24 0630  04/22/24 0700  BP: 96/63 97/75  Pulse: 83 85  Resp: (!) 24 (!) 25  Temp:  98.1 F (36.7 C)  SpO2: 92% 92%    Intake/Output Summary (Last 24 hours) at 04/22/2024 0744 Last data filed at 04/22/2024 0430 Gross per 24 hour  Intake 95.48 ml  Output 850 ml  Net -754.52 ml   Last Weight  Most recent update: 04/21/2024  6:28 AM    Weight  52 kg (114 lb 10.2 oz)            Gen: Elderly Caucasian female chronically ill in appearance HEENT: moist  mucous membranes CV: Regular rate and rhythm PULM: On RA, breathing is even and nonlabored ABD: soft/nontender  EXT: Trace  edema  Neuro: Alert and oriented x2-3  PPS: 40%   This conversation/these recommendations were discussed with patient primary care team, Dr. Gail ______________________________________________________ Rosaline Becton [facility] Palliative Medicine Team Team Cell Phone: [phone] Please utilize secure chat with additional questions, if there is no response within 30 minutes please call the above phone number  Total Time: 75 Billing based on MDM: High  Palliative Medicine Team providers are available by phone from 7am to 7pm daily and can be reached through the team cell phone.  Should this patient require assistance outside of these hours, please call the patient's attending physician.

## 2024-04-22 NOTE — Plan of Care (Signed)
  Problem: Cardiac: Goal: Ability to achieve and maintain adequate cardiovascular perfusion will improve Outcome: Progressing   Problem: Clinical Measurements: Goal: Will remain free from infection Outcome: Progressing Goal: Diagnostic test results will improve Outcome: Progressing

## 2024-04-22 NOTE — TOC Progression Note (Signed)
 Transition of Care [facility]) - Progression Note    Patient Details  Name: Colleen Fleming MRN: [id number] Date of Birth: 1955-12-30  [facility]) CM/SW Contact  Akhila Mahnken [name], [place] Phone Number: 04/22/2024, 3:40 PM  Clinical Narrative:     CSW notified by PMT that pt's son seeking LTC SNF facility and possible transition to hospice services in the future.  CSW called pt's son who confirmed he is seeking LTC at a SNF facility. He explains that pt was at an assisted living in the past and when they could no longer manage her care, they attempted to place her at [facility]. [name] son was not satisfied with quality of [facility] so he took pt to his home. He explained he is at the point where he can no longer care for her. He is agreeable to SNF w/u and agreeable to pt admitting to SNF under medicare for rehab while pt's medicaid is changed to LTC Medicaid. He specifically expressed interest in [facility] and also mentioned [facility] as 2nd choice. Fl2 completed and bed requests sent in hub.   Expected Discharge Plan: Skilled Nursing Facility Barriers to Discharge: No Barriers Identified, Insurance Authorization, Inadequate or no insurance  Expected Discharge Plan and Services In-house Referral: Clinical Social Work Discharge Planning Services: CM Consult   Living arrangements for the past 2 months: Single Family Home                                       Social Determinants of Health (SDOH) Interventions SDOH Screenings   Food Insecurity: No Food Insecurity (04/20/2024)  Housing: Low Risk  (04/20/2024)  Transportation Needs: Unknown (04/20/2024)  Utilities: Not At Risk (04/20/2024)  Financial Resource Strain: Low Risk  (02/18/2021)  Tobacco Use: High Risk (04/18/2024)    Readmission Risk Interventions    06/07/2022    1:39 PM  Readmission Risk Prevention Plan  Post Dischage Appt Complete  Medication Screening Complete  Transportation Screening  Complete

## 2024-04-23 ENCOUNTER — Other Ambulatory Visit: Payer: Self-pay

## 2024-04-23 DIAGNOSIS — D509 Iron deficiency anemia, unspecified: Secondary | ICD-10-CM | POA: Diagnosis not present

## 2024-04-23 DIAGNOSIS — Z7189 Other specified counseling: Secondary | ICD-10-CM | POA: Diagnosis not present

## 2024-04-23 DIAGNOSIS — Z515 Encounter for palliative care: Secondary | ICD-10-CM | POA: Diagnosis not present

## 2024-04-23 DIAGNOSIS — R7881 Bacteremia: Secondary | ICD-10-CM | POA: Diagnosis not present

## 2024-04-23 LAB — BASIC METABOLIC PANEL WITH GFR
Anion gap: 10 (ref 5–15)
BUN: 5 mg/dL — ABNORMAL LOW (ref 8–23)
CO2: 21 mmol/L — ABNORMAL LOW (ref 22–32)
Calcium: 8.5 mg/dL — ABNORMAL LOW (ref 8.9–10.3)
Chloride: 97 mmol/L — ABNORMAL LOW (ref 98–111)
Creatinine, Ser: 0.69 mg/dL (ref 0.44–1.00)
GFR, Estimated: >60 mL/min (ref >60)
Glucose, Bld: 92 mg/dL (ref 70–99)
Potassium: 3.8 mmol/L (ref 3.5–5.1)
Sodium: 128 mmol/L — ABNORMAL LOW (ref 135–145)

## 2024-04-23 LAB — CBC
HCT: 30.6 % — ABNORMAL LOW (ref 36.0–46.0)
Hemoglobin: 10.3 g/dL — ABNORMAL LOW (ref 12.0–15.0)
MCH: 27.8 pg (ref 26.0–34.0)
MCHC: 33.7 g/dL (ref 30.0–36.0)
MCV: 82.7 fL (ref 80.0–100.0)
Platelets: 336 K/uL (ref 150–400)
RBC: 3.7 MIL/uL — ABNORMAL LOW (ref 3.87–5.11)
RDW: 16.8 % — ABNORMAL HIGH (ref 11.5–15.5)
WBC: 9.2 K/uL (ref 4.0–10.5)
nRBC: 0 % (ref 0.0–0.2)

## 2024-04-23 LAB — CULTURE, BLOOD (ROUTINE X 2): Culture: NO GROWTH

## 2024-04-23 MED ORDER — ENSURE PLUS HIGH PROTEIN PO LIQD
237.0000 mL | Freq: Three times a day (TID) | ORAL | Status: DC
  Administered 2024-04-23: 237 mL via ORAL

## 2024-04-23 MED ORDER — NITROGLYCERIN 0.4 MG SL SUBL: 0.4000 mg | SUBLINGUAL_TABLET | SUBLINGUAL | Status: AC | PRN

## 2024-04-23 MED ORDER — FLUOXETINE HCL 10 MG PO CAPS: 30.0000 mg | ORAL_CAPSULE | Freq: Every day | ORAL | Status: AC

## 2024-04-23 MED ORDER — PANTOPRAZOLE SODIUM 40 MG PO TBEC: 40.0000 mg | DELAYED_RELEASE_TABLET | Freq: Every day | ORAL | Status: AC

## 2024-04-23 MED ORDER — CLOPIDOGREL BISULFATE 75 MG PO TABS: 75.0000 mg | ORAL_TABLET | Freq: Every day | ORAL | Status: DC

## 2024-04-23 MED ORDER — MIDODRINE HCL 10 MG PO TABS: 10.0000 mg | ORAL_TABLET | Freq: Three times a day (TID) | ORAL | Status: AC

## 2024-04-23 NOTE — Progress Notes (Signed)
   Palliative Medicine Inpatient Follow Up Note HPI: Patient is a 68 year old female past medical history significant for coronary artery disease, CHF, emphysema, GERD, anxiety, depression, obesity and small cell lung cancer.  Patient was admitted with STEMI, mild presentation, with decreased ejection fraction (20 to 25%). Palliative care has been asked to support additional goals of care conversations.   Today's Discussion 04/23/2024  *Please note that this is a verbal dictation therefore any spelling or grammatical errors are due to the Dragon Medical One system interpretation.  Chart reviewed inclusive of vital signs, progress notes, laboratory results, and diagnostic images.   I met with Colleen Fleming at bedside this morning. She shares, everyone keeps bothering her. I was able to assess her and asked is she was feeling uncomfortable, nauseated, or short of breath all of which she denied.   Created space and opportunity for patient to explore thoughts feelings and fears regarding current medical situation. She shares understanding of her weakened heart muscle. She and I reviewed the plan for her to transition to a nursing facility to gain strength.   __________________________________________________ Addendum:  Per coordination with MSW patient has been accepted to [facility] with the plan for transition today.  DNR placed on the front of chart.  Colleen Fleming with [facility] informed for OP Palliative support.   Patients son, Colleen Fleming called and updated.   Questions and concerns addressed/Palliative Support Provided.   Objective Assessment: Vital Signs Vitals:   04/23/24 0900 04/23/24 1000  BP: 96/69 101/80  Pulse: 90 74  Resp: (!) 28 (!) 21  Temp:    SpO2: 95% 95%    Intake/Output Summary (Last 24 hours) at 04/23/2024 1122 Last data filed at 04/23/2024 0800 Gross per 24 hour  Intake 60 ml  Output 550 ml  Net -490 ml   Last Weight  Most recent update: 04/21/2024  6:28 AM    Weight  52 kg  (114 lb 10.2 oz)            Gen: Elderly Caucasian female chronically ill in appearance HEENT: moist mucous membranes CV: Regular rate and rhythm PULM: On RA, breathing is even and nonlabored ABD: soft/nontender  EXT: Trace  edema  Neuro: Alert and oriented x2-3  SUMMARY OF RECOMMENDATIONS   DNAR/DNI   Plan for outpatient palliative support - if patient declines would transition to hospice through [facility]   Plan to transition to to [facility] in [place] today  ______________________________________________________________________________________ Colleen [place] Palliative Medicine Team Team Cell Phone: [phone] Please utilize secure chat with additional questions, if there is no response within 30 minutes please call the above phone number  Time Spent: 35  Palliative Medicine Team providers are available by phone from 7am to 7pm daily and can be reached through the team cell phone.  Should this patient require assistance outside of these hours, please call the patient's attending physician.

## 2024-04-23 NOTE — TOC Progression Note (Signed)
 Transition of Care [facility]) - Progression Note    Patient Details  Name: Colleen Fleming MRN: [id number] Date of Birth: November 29, 1955  Transition [facility] Care [facility]) CM/SW Contact  Justina Delcia Czar, RN Phone Number: [phone] 04/23/2024, 12:52 PM  Clinical Narrative:     TOC CM spoke to pt's son about outpt palliative. Agreeable and [name] has OP palliative services.   Expected Discharge Plan: Skilled Nursing Facility Barriers to Discharge: No Barriers Identified  Expected Discharge Plan and Services In-house Referral: Clinical Social Work Discharge Planning Services: CM Consult   Living arrangements for the past 2 months: Single Family Home Expected Discharge Date: 04/23/24                                     Social Determinants of Health (SDOH) Interventions SDOH Screenings   Food Insecurity: No Food Insecurity (04/20/2024)  Housing: Low Risk  (04/20/2024)  Transportation Needs: Patient Unable To Answer (04/23/2024)  Utilities: Not At Risk (04/20/2024)  Financial Resource Strain: Low Risk  (02/18/2021)  Social Connections: Unknown (04/23/2024)  Tobacco Use: High Risk (04/18/2024)    Readmission Risk Interventions    06/07/2022    1:39 PM  Readmission Risk Prevention Plan  Post Dischage Appt Complete  Medication Screening Complete  Transportation Screening Complete

## 2024-04-23 NOTE — [place] FL2 (Signed)
 [place]  MEDICAID FL2 LEVEL OF CARE FORM     IDENTIFICATION  Patient Name: Colleen Fleming Birthdate: 1955-12-10 Sex: female Admission Date (Current Location): 04/18/2024  [facility] and [place] Number:  Producer, television/film/video and Address:  The . [facility], [address], [place], [place] [address]      Provider Number: [id number]  Attending Physician Name and Address:  Gardenia Led, DO  Relative Name and Phone Number:  Barabara, Motz [phone])  [phone] [facility])    Current Level of Care: Hospital Recommended Level of Care: Skilled Nursing Facility Prior Approval Number:    Date Approved/Denied:   PASRR Number: [id number]  Discharge Plan: SNF    Current Diagnoses: Patient Active Problem List   Diagnosis Date Noted   Positive blood culture 04/19/2024   STEMI (ST elevation myocardial infarction) (HCC) 04/18/2024   Persistent depressive disorder 05/18/2022   Hypotension (arterial) 08/16/2021   Ischemic cardiomyopathy 08/16/2021   Fall    Chronic combined systolic and diastolic CHF, NYHA class 2 (HCC) 02/13/2021   Hyponatremia 02/10/2021   Pericardial effusion 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 [facility]) 02/01/2021   Acute ST elevation myocardial infarction (STEMI) involving left anterior descending (LAD) coronary artery (HCC) 01/30/2021   Hypokalemia    Panlobular emphysema (HCC)    Coronary artery disease involving native coronary artery of native heart with unstable angina pectoris (HCC)    Obesity 01/03/2015   Small cell lung cancer (HCC) 08/21/2013   GERD (gastroesophageal reflux disease) 11/25/2011   Nicotine abuse 11/25/2011   Hyperlipidemia with target LDL less than 70 11/25/2011   IBS (irritable bowel syndrome) 11/25/2011   Anxiety as acute reaction to exceptional stress 11/25/2011    Orientation RESPIRATION BLADDER Height & Weight     Self, Situation, Time, Place  Normal Incontinent Weight: 114 lb 10.2 oz (52 kg) Height:  4' 10 (147.3 cm)   BEHAVIORAL SYMPTOMS/MOOD NEUROLOGICAL BOWEL NUTRITION STATUS      Incontinent Diet (see d/c summary)  AMBULATORY STATUS COMMUNICATION OF NEEDS Skin   Extensive Assist Verbally Normal                       Personal Care Assistance Level of Assistance  Bathing, Feeding, Dressing Bathing Assistance: Maximum assistance Feeding assistance: Limited assistance Dressing Assistance: Limited assistance     Functional Limitations Info  Sight, Hearing, Speech Sight Info: Adequate Hearing Info: Adequate Speech Info: Adequate    SPECIAL CARE FACTORS FREQUENCY  OT (By licensed OT), PT (By licensed PT)     PT Frequency: 5x/week OT Frequency: 5x/week            Contractures Contractures Info: Not present    Additional Factors Info  Code Status, Allergies Code Status Info: DNR-limited Allergies Info: Penicillins           Current Medications (04/23/2024):  This is the current hospital active medication list Current Facility-Administered Medications  Medication Dose Route Frequency Provider Last Rate Last Admin   0.9 %  sodium chloride  infusion (Manually program via Guardrails IV Fluids)   Intravenous Once Cindie Ole DASEN, MD       acetaminophen  (TYLENOL ) tablet 500 mg  500 mg Oral Q6H PRN Verlin Lonni BIRCH, MD       aspirin  EC tablet 81 mg  81 mg Oral Daily Verlin Lonni BIRCH, MD   81 mg at 04/23/24 1031   atorvastatin  (LIPITOR ) tablet 80 mg  80 mg Oral Daily Verlin Lonni BIRCH, MD   80 mg at  04/23/24 1031   calcium  carbonate (TUMS - dosed in mg elemental calcium ) chewable tablet 200 mg of elemental calcium   1 tablet Oral Daily PRN Verlin Lonni BIRCH, MD       Chlorhexidine  Gluconate Cloth 2 % PADS 6 each  6 each Topical Daily Verlin Lonni BIRCH, MD   6 each at 04/23/24 1000   FLUoxetine  (PROZAC ) capsule 30 mg  30 mg Oral Daily Verlin Lonni BIRCH, MD   30 mg at 04/23/24 1031   Gerhardt's butt cream   Topical Daily PRN Ingram, Damarcus A, MD        loperamide  (IMODIUM ) capsule 2 mg  2 mg Oral PRN Verlin Lonni BIRCH, MD   2 mg at 04/22/24 2003   midodrine  (PROAMATINE ) tablet 10 mg  10 mg Oral TID WC Stoner, Benjamin J, MD   10 mg at 04/23/24 0757   nitroGLYCERIN  (NITROSTAT ) SL tablet 0.4 mg  0.4 mg Sublingual Q5 Min x 3 PRN Verlin Lonni BIRCH, MD       OLANZapine  (ZYPREXA ) tablet 2.5 mg  2.5 mg Oral Daily PRN Verlin Lonni BIRCH, MD       ondansetron  (ZOFRAN ) injection 4 mg  4 mg Intravenous Q6H PRN Verlin Lonni BIRCH, MD   4 mg at 04/22/24 2003   Oral care mouth rinse  15 mL Mouth Rinse PRN Verlin Lonni BIRCH, MD       sodium chloride  flush (NS) 0.9 % injection 3 mL  3 mL Intravenous PRN Verlin Lonni BIRCH, MD         Discharge Medications: Please see discharge summary for a list of discharge medications.  Relevant Imaging Results:  Relevant Lab Results:   Additional Information SS# [id number]  [facility], LCSW

## 2024-04-23 NOTE — TOC Transition Note (Addendum)
 Transition of Care [facility]) - Discharge Note   Patient Details  Name: Colleen Fleming MRN: [id number] Date of Birth: 15-Oct-1955  Transition of Care [facility]) CM/SW Contact:  Luann [name], LCSW Phone Number: 04/23/2024, 12:45 PM   Clinical Narrative:     [name] confirmed they can admit pt today. Pt will admit for Short term rehab under her medicare initially and then will transition to LTC. Pt' son agreeable to plan and has completed admission paperwork with [facility]. [facility] provides OP palliative in-house.    Per Provider patient ready for DC to [facility]. RN, patient, patient's family, and facility notified of DC. Discharge Summary and FL2 sent to facility. RN to call report prior to discharge [phone]). DC packet on chart. Ambulance transport requested for patient.   CSW will sign off for now as social work intervention is no longer needed. Please consult us  again if new needs arise.   Final next level of care: Skilled Nursing Facility Barriers to Discharge: No Barriers Identified   Patient Goals and CMS Choice Patient states their goals for this hospitalization and ongoing recovery are:: wants to get better          Discharge Placement              Patient chooses bed at: [facility] Patient to be transferred to facility by: [facility] Name of family member notified: Son [name] Patient and family notified of of transfer: 04/23/24  Discharge Plan and Services Additional resources added to the After Visit Summary for   In-house Referral: Clinical Social Work Discharge Planning Services: CM Consult                                 Social Drivers of Health (SDOH) Interventions SDOH Screenings   Food Insecurity: No Food Insecurity (04/20/2024)  Housing: Low Risk  (04/20/2024)  Transportation Needs: Patient Unable To Answer (04/23/2024)  Utilities: Not At Risk (04/20/2024)  Financial Resource Strain: Low Risk  (02/18/2021)  Social Connections:  Unknown (04/23/2024)  Tobacco Use: High Risk (04/18/2024)     Readmission Risk Interventions    06/07/2022    1:39 PM  Readmission Risk Prevention Plan  Post Dischage Appt Complete  Medication Screening Complete  Transportation Screening Complete

## 2024-04-23 NOTE — Discharge Summary (Addendum)
 Advanced Heart Failure Team  Discharge Summary   Patient ID: Colleen Fleming MRN: [id number], DOB/AGE: 02-03-1956 68 y.o. Admit date: 04/18/2024 D/C date:     04/23/2024   Primary Discharge Diagnoses:  CAD STEMI HFrEF d/t ICM  Secondary Discharge Diagnoses:  ABLA Failure to [facility] Course:   68 y.o. female with history of lung CA s/p chemo/RT, tobacco abuse, CAD s/p anterior STEMI in 2022 with stenting of proximal LAD, HLD,  ICM with EF 30-35%, and failure to thrive.   Admitted 04/18/24 for late presenting anterior STEMI.   She was taken to cath lab 04/19/24 showing occlusion of previously stented proximal LAD and normal filling pressures. Elected for medical management, however DAPT held in the setting of possible GIB requiring 2u RBCs 7/19. Hgb has been stable since. Single APT with aspirin  81 mg, P2Y12 held. Echo 7/19 showed EF severely reduced 20-25% with anterior wall motion abnormalities. She was admitted to the ICU for close monitoring. Course complicated by hypotension. Her GDMT was stopped and she was started on midodrine  10 mg tid. Given her debility and failure to thrive. Palliative Care was consulted who spoke to patient and son at length. She is discharging to SNF with outpatient Palliative Care follow up.   Stable for discharge today. [facility] follow up on 8/11 at 1:55p.   Discharge Vitals: Blood pressure 96/63, pulse 75, temperature 99 F (37.2 C), temperature source Oral, resp. rate (!) 22, height 4' 10 (1.473 m), weight 52 kg, SpO2 96%.  Labs: Lab Results  Component Value Date   WBC 9.2 04/23/2024   HGB 10.3 (L) 04/23/2024   HCT 30.6 (L) 04/23/2024   MCV 82.7 04/23/2024   PLT 336 04/23/2024    Recent Labs  Lab 04/22/24 0412 04/23/24 0331  NA 127* 128*  K 4.6 3.8  CL 97* 97*  CO2 18* 21*  BUN 7* 5*  CREATININE 0.74 0.69  CALCIUM  8.0* 8.5*  PROT 5.1*  --   BILITOT 1.0  --   ALKPHOS 112  --   ALT 17  --   AST 42*  --   GLUCOSE 81 92   Lab Results   Component Value Date   CHOL 232 (H) 04/18/2024   HDL 51 04/18/2024   LDLCALC 163 (H) 04/18/2024   TRIG 92 04/18/2024   BNP (last 3 results) No results for input(s): BNP in the last 8760 hours.  ProBNP (last 3 results) No results for input(s): PROBNP in the last 8760 hours.   Diagnostic Studies/Procedures   No results found.  Discharge Medications   Allergies as of 04/23/2024       Reactions   Penicillins Hives        Medication List     STOP taking these medications    famotidine  20 MG tablet Commonly known as: PEPCID    lipase/protease/amylase 12000-38000 units Cpep capsule Commonly known as: CREON    lisinopril 10 MG tablet Commonly known as: ZESTRIL   metoprolol  succinate 25 MG 24 hr tablet Commonly known as: Toprol  XL   OLANZapine  5 MG tablet Commonly known as: ZYPREXA    Vitamin D3 25 MCG (1000 UT) Caps       TAKE these medications    acetaminophen  500 MG tablet Commonly known as: TYLENOL  Take 500 mg by mouth every 6 (six) hours as needed for mild pain.   aspirin  EC 81 MG tablet Take 81 mg by mouth as needed. (0900) Swallow whole.   atorvastatin  80 MG tablet Commonly known as:  LIPITOR  Take 1 tablet (80 mg total) by mouth daily.   calcium  carbonate 750 MG chewable tablet Commonly known as: TUMS EX Chew 2 tablets by mouth daily as needed for heartburn.   dimenhyDRINATE 50 MG tablet Commonly known as: DRAMAMINE Take 50 mg by mouth 2 (two) times daily as needed for nausea.   FLUoxetine  10 MG capsule Commonly known as: PROzac  Take 3 capsules (30 mg total) by mouth daily. Start taking on: April 24, 2024 What changed:  how much to take Another medication with the same name was removed. Continue taking this medication, and follow the directions you see here.   loperamide  2 MG capsule Commonly known as: IMODIUM  Take 2 mg by mouth as needed for diarrhea or loose stools.   magnesium  oxide 400 MG tablet Commonly known as: MAG-OX Take  400 mg by mouth daily.   midodrine  10 MG tablet Commonly known as: PROAMATINE  Take 1 tablet (10 mg total) by mouth 3 (three) times daily with meals.   nitroGLYCERIN  0.4 MG SL tablet Commonly known as: NITROSTAT  Place 1 tablet (0.4 mg total) under the tongue every 5 (five) minutes x 3 doses as needed for chest pain.   pantoprazole  40 MG tablet Commonly known as: Protonix  Take 1 tablet (40 mg total) by mouth daily.       Disposition   The patient will be discharged in stable condition to home. Discharge Instructions     (HEART FAILURE PATIENTS) Call MD:  Anytime you have any of the following symptoms: 1) 3 pound weight gain in 24 hours or 5 pounds in 1 week 2) shortness of breath, with or without a dry hacking cough 3) swelling in the hands, feet or stomach 4) if you have to sleep on extra pillows at night in order to breathe.   Complete by: As directed    Diet - low sodium heart healthy   Complete by: As directed    Increase activity slowly   Complete by: As directed    STOP any activity that causes chest pain, shortness of breath, dizziness, sweating, or exessive weakness   Complete by: As directed        Contact information for follow-up providers     Lucien Orren SAILOR, [name] Follow up on 05/13/2024.   Specialty: Cardiology Why: at 1:55pm Contact information: [address] [place] [address] [phone]              Contact information for after-discharge care     Destination     [facility] .   Service: Skilled Nursing Contact information: [address] [place]  [address] [phone]                      Duration of Discharge Encounter: 25 min   [place] Ab Leaming, NP 04/23/2024, 12:24 PM

## 2024-04-23 NOTE — Progress Notes (Signed)
 PROGRESS NOTE  Colleen Fleming  FMW:[id number] DOB: 1956/03/22 DOA: 04/18/2024 PCP: Boneta, [place]  E, [name]  Consultants  Brief Narrative: Patient is a 68 year old female past medical history significant for coronary artery disease, CHF, emphysema, GERD, anxiety, depression, obesity and small cell lung cancer.  Patient was admitted with late presentation STEMI, with decreased ejection fraction (20 to 25%).  Cardiology team is directing care.  Patient has not able to tolerate GDMT due to hypotension.  Patient is currently on midodrine  10 Mg p.o. 3 times daily.  DAPT is also on hold due to GI bleed.  Patient was transfused 2 units of packed red blood cells, and hemoglobin has remained stable.  Palliative care team has been consulted.  Medical team was consulted to assist with anemia and medical management.     04/23/2024: Patient seen today.  No new complaints.  Denies chest pain or any other concerns.  Lying quietly in bed.   Assessment & Plan:    STEMI: - Late presentation. - Cardiology team is directing care-->Plan is DC to SNF today. - Due to hypotension, GDMT is on hold.  Patient is on midodrine  10 Mg p.o. 3 times daily.   - Palliative care also following.     Anemia, likely iron deficiency anemia: - Status post transfusion of 2 packed red blood cells-->Hgb has been very stable since then.  - Plavix  is on hold-->defer to cardiology.   - Continue to monitor H/H clinically..   Hyperlipidemia: - Continue atorvastatin .   Anxiety: Depression: - Continue fluoxetine  and olanzapine .  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 Afebrile, no leukocytosis - Repeat blood cx's negative    Guarded prognosis.  Pt discharged today per cardiology.       DVT  prophylaxis:    Code Status:   Code Status: Limited: Do not attempt resuscitation (DNR) -DNR-LIMITED -Do Not Intubate/DNI  Level of care: Telemetry Cardiac  Subjective: Pt lying in bed, quiet, doesn't want to be bothered.   Objective: Vitals:   04/23/24 1100 04/23/24 1135 04/23/24 1200 04/23/24 1300  BP: 92/60  96/63 (!) 85/61  Pulse:   75   Resp: (!) 24 (!) 24 (!) 22 (!) 22  Temp:  99 F (37.2 C)    TempSrc:  Oral    SpO2:   96%   Weight:      Height:        Intake/Output Summary (Last 24 hours) at 04/23/2024 1500 Last data filed at 04/23/2024 1200 Gross per 24 hour  Intake 400 ml  Output 550 ml  Net -150 ml   Filed Weights   04/18/24 1829 04/19/24 0000 04/21/24 0615  Weight: 49.9 kg 48.3 kg 52 kg   Body mass index is 23.96 kg/m.  Gen: 68 y.o. female in no apparent distress.  Frail appearing and chronically ill-appearing Pulm: Non-labored breathing.  Clear to auscultation bilaterally.  CV: Regular rate and rhythm. No murmur, rub, or gallop. No JVD GI: Abdomen soft, non-tender, non-distended Ext: Warm, no deformities Skin: No rashes, lesions  Neuro: Alert. No focal neurological deficits. Psych: Calm     I have personally reviewed the following labs and images: CBC: Recent Labs  Lab 04/18/24 1839 04/18/24 1855 04/19/24 0340 04/19/24 1555 04/19/24 1558 04/21/24 1000 04/21/24 1001 04/22/24 1141 04/23/24  0331  WBC 17.7*  --  16.1*  --   --  9.3  --  10.3 9.2  NEUTROABS 14.8*  --   --   --   --  7.1  --   --   --   HGB 9.1*   < > 8.0*   < > 7.1* 9.8* 9.8* 10.5* 10.3*  HCT 28.0*   < > 24.0*   < > 21.0* 29.0* 29.5* 30.8* 30.6*  MCV 85.9  --  84.2  --   --  83.6  --  83.0 82.7  PLT 289  --  245  --   --  251  --  283 336   < > = values in this interval not displayed.   BMP &GFR Recent Labs  Lab 04/18/24 1839 04/18/24 1855 04/20/24 0407 04/20/24 1203 04/21/24 1000 04/21/24 1001 04/22/24 0412 04/23/24 0331  NA 127*   < > 125*  --  128* 129* 127* 128*   K 3.8   < > 3.4*  --  4.3 4.4 4.6 3.8  CL 92*  --  97*  --  98 99 97* 97*  CO2 21*  --  20*  --  22 23 18* 21*  GLUCOSE 129*  --  96  --  89 87 81 92  BUN 9  --  6*  --  7* 7* 7* 5*  CREATININE 0.76  --  0.85  --  0.84 0.76 0.74 0.69  CALCIUM  8.7*  --  7.8*  --  8.6* 8.6* 8.0* 8.5*  MG 1.7  --   --  1.9 1.6*  --   --   --   PHOS  --   --   --   --   --  2.8  --   --    < > = values in this interval not displayed.   Estimated Creatinine Clearance: 49.5 mL/min (by C-G formula based on SCr of 0.69 mg/dL). Liver & Pancreas: Recent Labs  Lab 04/18/24 1839 04/20/24 0407 04/21/24 1000 04/21/24 1001 04/22/24 0412  AST 291* 99* 47*  --  42*  ALT 50* 30 23  --  17  ALKPHOS 88 69 107  --  112  BILITOT 0.5 0.6 1.1  --  1.0  PROT 6.4* 5.2* 5.8*  --  5.1*  ALBUMIN  3.2* 2.3* 2.9* 2.9* 2.5*   Recent Labs  Lab 04/18/24 1839  LIPASE 21   Recent Labs  Lab 04/18/24 2000  AMMONIA 16   Diabetic: No results for input(s): HGBA1C in the last 72 hours. Recent Labs  Lab 04/20/24 1127  GLUCAP 143*   Cardiac Enzymes: No results for input(s): CKTOTAL, CKMB, CKMBINDEX, TROPONINI in the last 168 hours. No results for input(s): PROBNP in the last 8760 hours. Coagulation Profile: Recent Labs  Lab 04/18/24 1839  INR 1.1   Thyroid Function Tests: No results for input(s): TSH, T4TOTAL, FREET4, T3FREE, THYROIDAB in the last 72 hours. Lipid Profile: No results for input(s): CHOL, HDL, LDLCALC, TRIG, CHOLHDL, LDLDIRECT in the last 72 hours. Anemia Panel: No results for input(s): VITAMINB12, FOLATE, FERRITIN, TIBC, IRON, RETICCTPCT in the last 72 hours. Urine analysis:    Component Value Date/Time   COLORURINE YELLOW 04/20/2024 1048   APPEARANCEUR CLEAR 04/20/2024 1048   LABSPEC 1.014 04/20/2024 1048   PHURINE 6.0 04/20/2024 1048   GLUCOSEU NEGATIVE 04/20/2024 1048   HGBUR NEGATIVE 04/20/2024 1048   BILIRUBINUR NEGATIVE 04/20/2024 1048    KETONESUR 5 (A) 04/20/2024 1048  PROTEINUR NEGATIVE 04/20/2024 1048   NITRITE NEGATIVE 04/20/2024 1048   LEUKOCYTESUR NEGATIVE 04/20/2024 1048   Sepsis Labs: Invalid input(s): PROCALCITONIN, LACTICIDVEN  Microbiology: Recent Results (from the past 240 hours)  Resp panel by RT-PCR (RSV, Flu A&B, Covid) Anterior Nasal Swab     Status: None   Collection Time: 04/18/24  6:42 PM   Specimen: Anterior Nasal Swab  Result Value Ref Range Status   SARS Coronavirus 2 by RT PCR NEGATIVE NEGATIVE Final   Influenza A by PCR NEGATIVE NEGATIVE Final   Influenza B by PCR NEGATIVE NEGATIVE Final    Comment: (NOTE) The Xpert Xpress SARS-CoV-2/FLU/RSV plus assay is intended as an aid in the diagnosis of influenza from Nasopharyngeal swab specimens and should not be used as a sole basis for treatment. Nasal washings and aspirates are unacceptable for Xpert Xpress SARS-CoV-2/FLU/RSV testing.  Fact Sheet for Patients: BloggerCourse.com  Fact Sheet for Healthcare Providers: SeriousBroker.it  This test is not yet approved or cleared by the [place]  FDA and has been authorized for detection and/or diagnosis of SARS-CoV-2 by FDA under an Emergency Use Authorization (EUA). This EUA will remain in effect (meaning this test can be used) for the duration of the COVID-19 declaration under Section 564(b)(1) of the Act, 21 U.S.C. section 360bbb-3(b)(1), unless the authorization is terminated or revoked.     Resp Syncytial Virus by PCR NEGATIVE NEGATIVE Final    Comment: (NOTE) Fact Sheet for Patients: BloggerCourse.com  Fact Sheet for Healthcare Providers: SeriousBroker.it  This test is not yet approved or cleared by the [place]  FDA and has been authorized for detection and/or diagnosis of SARS-CoV-2 by FDA under an Emergency Use Authorization (EUA). This EUA will remain in effect  (meaning this test can be used) for the duration of the COVID-19 declaration under Section 564(b)(1) of the Act, 21 U.S.C. section 360bbb-3(b)(1), unless the authorization is terminated or revoked.  Performed at [facility], [address], [place], [place] [address]   Blood Culture (routine x 2)     Status: None   Collection Time: 04/18/24  6:42 PM   Specimen: BLOOD  Result Value Ref Range Status   Specimen Description BLOOD RIGHT ANTECUBITAL  Final   Special Requests   Final    BOTTLES DRAWN AEROBIC AND ANAEROBIC Blood Culture results may not be optimal due to an inadequate volume of blood received in culture bottles   Culture   Final    NO GROWTH 5 DAYS Performed at [facility], [address], [place], [place] [address]    Report Status 04/23/2024 FINAL  Final  Blood Culture (routine x 2)     Status: Abnormal   Collection Time: 04/18/24  6:47 PM   Specimen: BLOOD  Result Value Ref Range Status   Specimen Description BLOOD LEFT ANTECUBITAL  Final   Special Requests   Final    BOTTLES DRAWN AEROBIC AND ANAEROBIC Blood Culture results may not be optimal due to an inadequate volume of blood received in culture bottles   Culture  Setup Time   Final    GRAM POSITIVE COCCI IN CLUSTERS IN BOTH AEROBIC AND ANAEROBIC BOTTLES CRITICAL RESULT CALLED TO, READ BACK BY AND VERIFIED WITH: PHARMD ANDREW MEYER ON 04/19/24 @ 1600 BY DRT    Culture (A)  Final    STAPHYLOCOCCUS HAEMOLYTICUS THE SIGNIFICANCE OF ISOLATING THIS ORGANISM FROM A SINGLE SET OF BLOOD CULTURES WHEN MULTIPLE SETS ARE DRAWN IS UNCERTAIN. PLEASE NOTIFY THE MICROBIOLOGY DEPARTMENT WITHIN  ONE WEEK IF SPECIATION AND SENSITIVITIES ARE REQUIRED. Performed at [facility], [address], [place], [place] [address]    Report Status 04/21/2024 FINAL  Final  Blood Culture ID Panel (Reflexed)     Status: Abnormal   Collection Time: 04/18/24  6:47 PM  Result Value Ref Range Status   Enterococcus faecalis NOT  DETECTED NOT DETECTED Final   Enterococcus Faecium NOT DETECTED NOT DETECTED Final   Listeria monocytogenes NOT DETECTED NOT DETECTED Final   Staphylococcus species DETECTED (A) NOT DETECTED Final    Comment: CRITICAL RESULT CALLED TO, READ BACK BY AND VERIFIED WITH: PHARMD ANDREW MEYER ON 04/19/24 @ 1600 BY DRT    Staphylococcus aureus (BCID) NOT DETECTED NOT DETECTED Final   Staphylococcus epidermidis NOT DETECTED NOT DETECTED Final   Staphylococcus lugdunensis NOT DETECTED NOT DETECTED Final   Streptococcus species NOT DETECTED NOT DETECTED Final   Streptococcus agalactiae NOT DETECTED NOT DETECTED Final   Streptococcus pneumoniae NOT DETECTED NOT DETECTED Final   Streptococcus pyogenes NOT DETECTED NOT DETECTED Final   A.calcoaceticus-baumannii NOT DETECTED NOT DETECTED Final   Bacteroides fragilis NOT DETECTED NOT DETECTED Final   Enterobacterales NOT DETECTED NOT DETECTED Final   Enterobacter cloacae complex NOT DETECTED NOT DETECTED Final   Escherichia coli NOT DETECTED NOT DETECTED Final   Klebsiella aerogenes NOT DETECTED NOT DETECTED Final   Klebsiella oxytoca NOT DETECTED NOT DETECTED Final   Klebsiella pneumoniae NOT DETECTED NOT DETECTED Final   Proteus species NOT DETECTED NOT DETECTED Final   Salmonella species NOT DETECTED NOT DETECTED Final   Serratia marcescens NOT DETECTED NOT DETECTED Final   Haemophilus influenzae NOT DETECTED NOT DETECTED Final   Neisseria meningitidis NOT DETECTED NOT DETECTED Final   Pseudomonas aeruginosa NOT DETECTED NOT DETECTED Final   Stenotrophomonas maltophilia NOT DETECTED NOT DETECTED Final   Candida albicans NOT DETECTED NOT DETECTED Final   Candida auris NOT DETECTED NOT DETECTED Final   Candida glabrata NOT DETECTED NOT DETECTED Final   Candida krusei NOT DETECTED NOT DETECTED Final   Candida parapsilosis NOT DETECTED NOT DETECTED Final   Candida tropicalis NOT DETECTED NOT DETECTED Final   Cryptococcus neoformans/gattii NOT  DETECTED NOT DETECTED Final    Comment: Performed at [facility], [address], [place], [place] [address]  Culture, blood (Routine X 2) w Reflex to ID Panel     Status: None (Preliminary result)   Collection Time: 04/19/24  6:15 PM   Specimen: BLOOD LEFT HAND  Result Value Ref Range Status   Specimen Description BLOOD LEFT HAND  Final   Special Requests   Final    BOTTLES DRAWN AEROBIC AND ANAEROBIC Blood Culture adequate volume   Culture   Final    NO GROWTH 4 DAYS Performed at [facility], [address] [place], [place] [address]    Report Status PENDING  Incomplete  Culture, blood (Routine X 2) w Reflex to ID Panel     Status: None (Preliminary result)   Collection Time: 04/21/24 12:50 PM   Specimen: BLOOD LEFT ARM  Result Value Ref Range Status   Specimen Description BLOOD LEFT ARM  Final   Special Requests   Final    BOTTLES DRAWN AEROBIC ONLY Blood Culture results may not be optimal due to an inadequate volume of blood received in culture bottles   Culture   Final    NO GROWTH 2 DAYS Performed at [facility], [address], [place],  [place] [address]    Report Status PENDING  Incomplete  Culture, blood (Routine X 2) w Reflex to ID Panel     Status: None (Preliminary result)   Collection Time: 04/21/24 12:50 PM   Specimen: BLOOD LEFT ARM  Result Value Ref Range Status   Specimen Description BLOOD LEFT ARM  Final   Special Requests   Final    BOTTLES DRAWN AEROBIC ONLY Blood Culture results may not be optimal due to an inadequate volume of blood received in culture bottles   Culture   Final    NO GROWTH 2 DAYS Performed at [facility], [address], [place], [place] [address]    Report Status PENDING  Incomplete    Radiology Studies: No results found.  Scheduled Meds:  sodium chloride    Intravenous Once   aspirin  EC  81 mg Oral Daily   atorvastatin   80 mg Oral Daily   Chlorhexidine  Gluconate Cloth  6 each Topical Daily   feeding  supplement  237 mL Oral TID BM   FLUoxetine   30 mg Oral Daily   midodrine   10 mg Oral TID WC   Continuous Infusions:   LOS: 5 days   35 minutes with more than 50% spent in reviewing records, counseling patient/family and coordinating care.  Reyes VEAR Gaw, MD [facility] [email or web address] 04/23/2024, 3:00 PM

## 2024-04-24 LAB — CULTURE, BLOOD (ROUTINE X 2)
Culture: NO GROWTH
Special Requests: ADEQUATE

## 2024-04-26 LAB — CULTURE, BLOOD (ROUTINE X 2)
Culture: NO GROWTH
Culture: NO GROWTH

## 2024-05-06 NOTE — Progress Notes (Deleted)
 Cardiology Office Note   Date:  05/06/2024  ID:  Colleen, Fleming 1956/04/25, MRN [id number] PCP: Boneta Doyle BRAVO, [name]  [facility] Providers Cardiologist:  Alm Clay, MD    History of Present Illness Colleen Fleming is a 68 y.o. female with a past medical history of lung cancer status post chemo/RT, tobacco abuse, CAD status post anterior STEMI in 2022 with stenting of proximal LAD, HLD, ICM with EF 33%, and failure to thrive here for follow-up appointment.  Was admitted 04/18/2024 for late presentation of anterior STEMI.  Was taken to the Cath Lab 04/19/2024 showing occlusion of previously stented proximal LAD and normal filling pressures.  Elected for medical management, however DAPT held in the setting of possible GIB requiring 2 units RBCs on 7/19.  Hemoglobin stable since then.  Single DAPT with aspirin  81 mg, P2 Y12 held.  Echo 7/19 showed EF severely reduced 2025% with anterior wall motion abnormalities.  Admitted to ICU for close monitoring.  Her course was complicated by hypotension.  Her GDMT was stopped and she was started on midodrine  10 mg 3 times daily.  Given her debility and failure to thrive palliative care was consulted and spoke with patient and son at length.  Discharged to SNF with outpatient palliative care follow-up.  Today, she***  ROS: Pertinent ROS in HPI  Studies Reviewed     Cardiac catheterization 04/19/2024 Left Main  Vessel is normal in caliber.    Left Anterior Descending  Ost LAD to Prox LAD lesion is 100% stenosed. The lesion is chronically occluded. The lesion was previously treated using a drug eluting stent over 2 years ago.    First Diagonal Branch  Vessel is small in size.    First Septal Branch  Vessel is small in size.    Second Diagonal Branch  Vessel is angiographically normal.    Second Septal Branch  Vessel is small in size.    Third Diagonal Branch  Vessel is small in size.    Left Circumflex  Vessel is large.  Vessel is angiographically normal.    First Obtuse Marginal Branch  Vessel is angiographically normal.    First Left Posterolateral Branch  Vessel is moderate in size.    Left Posterior Atrioventricular Artery  Vessel is moderate in size. Vessel is angiographically normal.    Right Coronary Artery  Vessel is large. Vessel is angiographically normal.    Acute Marginal Branch  Vessel is small in size.    Right Ventricular Branch  Vessel is small in size.    First Right Posterolateral Branch  Vessel is small in size.    Intervention   No interventions have been documented.   Coronary Diagrams  Diagnostic Dominance: Right  Risk Assessment/Calculations {Does this patient have ATRIAL FIBRILLATION?[phone]} No BP recorded.  {Refresh Note OR Click here to enter BP  :1}***       Physical Exam VS:  There were no vitals taken for this visit.       Wt Readings from Last 3 Encounters:  04/21/24 114 lb 10.2 oz (52 kg)  11/21/23 105 lb (47.6 kg)  07/22/22 107 lb 5.8 oz (48.7 kg)    GEN: Well nourished, well developed in no acute distress NECK: No JVD; No carotid bruits CARDIAC: ***RRR, no murmurs, rubs, gallops RESPIRATORY:  Clear to auscultation without rales, wheezing or rhonchi  ABDOMEN: Soft, non-tender, non-distended EXTREMITIES:  No edema; No deformity   ASSESSMENT AND PLAN STEMI Chronic combined systolic and diastolic  CHF CAD Hypertension Ischemic cardiomyopathy Pericardial effusion 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    {Are you ordering a CV Procedure (e.g. stress test, cath, DCCV, TEE, etc)?   Press F2        :[id number]}  Dispo: ***  Signed, Orren LOISE Fabry, [name]

## 2024-05-13 ENCOUNTER — Ambulatory Visit: Admitting: Physician Assistant

## 2024-05-13 DIAGNOSIS — I2510 Atherosclerotic heart disease of native coronary artery without angina pectoris: Secondary | ICD-10-CM

## 2024-05-13 DIAGNOSIS — I255 Ischemic cardiomyopathy: Secondary | ICD-10-CM

## 2024-05-13 DIAGNOSIS — I241 Dressler's syndrome: Secondary | ICD-10-CM

## 2024-05-13 DIAGNOSIS — I5022 Chronic systolic (congestive) heart failure: Secondary | ICD-10-CM

## 2024-05-13 DIAGNOSIS — I2102 ST elevation (STEMI) myocardial infarction involving left anterior descending coronary artery: Secondary | ICD-10-CM

## 2024-05-13 DIAGNOSIS — I10 Essential (primary) hypertension: Secondary | ICD-10-CM

## 2024-05-13 DIAGNOSIS — E7800 Pure hypercholesterolemia, unspecified: Secondary | ICD-10-CM

## 2024-05-20 ENCOUNTER — Ambulatory Visit

## 2024-05-29 ENCOUNTER — Ambulatory Visit (INDEPENDENT_AMBULATORY_CARE_PROVIDER_SITE_OTHER)

## 2024-05-29 DIAGNOSIS — I255 Ischemic cardiomyopathy: Secondary | ICD-10-CM

## 2024-05-30 ENCOUNTER — Ambulatory Visit: Payer: Self-pay | Admitting: Internal Medicine

## 2024-05-30 LAB — CUP PACEART REMOTE DEVICE CHECK
Battery Remaining Longevity: 111 mo
Battery Voltage: 3.01 V
Brady Statistic RV Percent Paced: 0.01 %
Date Time Interrogation Session: 20250827153307
HighPow Impedance: 60 Ohm
Implantable Lead Connection Status: 753985
Implantable Lead Implant Date: 20230217
Implantable Lead Location: 753860
Implantable Lead Model: 6935
Implantable Pulse Generator Implant Date: 20230217
Lead Channel Impedance Value: 342 Ohm
Lead Channel Impedance Value: 418 Ohm
Lead Channel Pacing Threshold Amplitude: 0.75 V
Lead Channel Pacing Threshold Pulse Width: 0.4 ms
Lead Channel Sensing Intrinsic Amplitude: 9.5 mV
Lead Channel Sensing Intrinsic Amplitude: 9.5 mV
Lead Channel Setting Pacing Amplitude: 2 V
Lead Channel Setting Pacing Pulse Width: 0.4 ms
Lead Channel Setting Sensing Sensitivity: 0.3 mV
Zone Setting Status: 755011
Zone Setting Status: 755011

## 2024-06-11 NOTE — Progress Notes (Unsigned)
 Cardiology Office Note    Date:  06/11/2024  ID:  Ernesha, Ramone 1956-04-04, MRN [id number] PCP:  Boneta Steva BRAVO, [name]  Cardiologist:  Alm Clay, MD  Electrophysiologist:  None   Chief Complaint: Follow up for CAD and HFrEF   History of Present Illness: .    Colleen Fleming is a 68 y.o. female with visit-pertinent history of COPD, lung cancer s/p chemo and radiation, heavy tobacco abuse, CAD, ICM, hyperlipidemia and obesity.  She was initially admitted on 01/30/2021 with acute anterior STEMI.  Emergent cardiac catheterization revealed 99% proximal LAD occlusion treated with DES, 80% distal LAD lesion treated medically, no other significant coronary artery disease.  Serial troponin was greater than 27,000.  Although LV gram on initial Showed EF 45 to 40%, follow-up echocardiogram revealed severely reduced LVEF of 25 to 30% with wall motion abnormality in anterior wall, small pericardial effusion.  Postprocedure she was placed on aspirin , Brilinta , high intensity statin and beta-blocker.  Her hospital course was complicated by hypotension requiring norepinephrine .  Repeat limited echocardiogram showed no obvious complication, RV was small, small pericardial fusion was noted.  She is felt to be dry and was treated with IV fluids.  After discharge she returned to the hospital in 02/10/2021 with diarrhea and weakness.  On arrival she was hypokalemic with potassium of 2.8, sodium was as low as 117.  She.  Hypocalcemia of 7.7.  COVID test came back positive.  She was started on IV fluid with hydration.  Midodrine  was added to help elevate blood pressure.  Repeat echo on 02/13/2021 continue to show EF 20 to 25%, moderate pericardial effusion, no overt tamponade.  Right heart cath performed 02/15/2021 showed wedge pressure 4, RV pressure 17/2, cardiac output 3.0, cardiac index 2.4.  Low filling pressures was moderately reduced cardiac output was noted.  Patient was hydrated, heart failure titration was  limited by low blood pressure and volume depletion.  She was started on digoxin .  Patient had follow-up with heart failure clinic however was lost to follow-up.   She was last seen by Dr. Clay on 08/16/2021, her midodrine  was reduced to 2.5 mg twice daily.  She was started on Toprol  12.5 mg daily with plans for close follow-up.  Her digoxin  was discontinued because of history of electrolyte derangement.  Echocardiogram on 08/23/2021 indicated LVEF of 30 to 35%, LV with moderately decreased function, wall motion abnormalities noted, internal cavity was moderately dilated, she had G1 DD.  A small pericardial effusion was present, there is mild mitral valve regurgitation.  She was seen by Dr. Waddell on 11/01/2021 for consideration of ICD insertion.  On 11/19/2021 she underwent ICD implant with Dr. Waddell.  Was admitted 04/18/2024 for late presentation of anterior STEMI.  Was taken to the Cath Lab 04/19/2024 showing occlusion of previously stented proximal LAD and normal filling pressures.  Elected for medical management, however DAPT held in the setting of possible GIB requiring 2 units RBCs on 7/19.  Hemoglobin stable since then.  Single antiplatelet therapy with aspirin  81 mg, P2 Y12 held.  Echo 7/19 showed EF severely reduced 2025% with anterior wall motion abnormalities.  Admitted to ICU for close monitoring.  Her course was complicated by hypotension.  Her GDMT was stopped and she was started on midodrine  10 mg 3 times daily.  Given her debility and failure to thrive palliative care was consulted and spoke with patient and son at length.  Discharged to SNF with outpatient palliative care follow-up.  Today she presents for follow-up with her son.  Patient provides much of patient's history as she is a poor historian, he notes that her dementia does seem to be progressing.  Patient denies any chest pain or shortness of breath, her son notes that she has not remarked as to any.  Denies any lower extremity edema,  with apnea or PND.  Patient has been continued on midodrine , on review of blood pressures at her facility appears to average 110/60.  Discussed checking EKG today, patient and son deferred.  They deny any cardiac concerns or complaints today.   Labwork independently reviewed:   ROS: .   *** denies chest pain, shortness of breath, lower extremity edema, fatigue, palpitations, melena, hematuria, hemoptysis, diaphoresis, weakness, presyncope, syncope, orthopnea, and PND.  All other systems are reviewed and otherwise negative.  Studies Reviewed: SABRA    EKG:  EKG is ordered today, personally reviewed, demonstrating ***     CV Studies: Cardiac studies reviewed are outlined and summarized above. Otherwise please see EMR for full report. Cardiac Studies & Procedures   ______________________________________________________________________________________________ CARDIAC CATHETERIZATION  CARDIAC CATHETERIZATION 04/19/2024  Conclusion   Ost LAD to Prox LAD lesion is 100% stenosed.  Late presenting MI secondary to occlusion of the proximal LAD stented segment. No obstructive disease in the Circumflex or RCA RA 8 RV 29/6/12 PA 31/16 mean 23 PCWP 15 LV 94/10/21 CO 4.87 L/min CI 3.06  Recommendations: Will not attempt to open LAD as it has likely been occluded for 72 hours. Medical management. Case reviewed with Dr. [place].  Findings Coronary Findings Diagnostic  Dominance: Right  Left Main Vessel is normal in caliber.  Left Anterior Descending Ost LAD to Prox LAD lesion is 100% stenosed. The lesion is chronically occluded. The lesion was previously treated using a drug eluting stent over 2 years ago.  First Diagonal Branch Vessel is small in size.  First Septal Branch Vessel is small in size.  Second Diagonal Branch Vessel is angiographically normal.  Second Septal Branch Vessel is small in size.  Third Diagonal Branch Vessel is small in size.  Left Circumflex Vessel is  large. Vessel is angiographically normal.  First Obtuse Marginal Branch Vessel is angiographically normal.  First Left Posterolateral Branch Vessel is moderate in size.  Left Posterior Atrioventricular Artery Vessel is moderate in size. Vessel is angiographically normal.  Right Coronary Artery Vessel is large. Vessel is angiographically normal.  Acute Marginal Branch Vessel is small in size.  Right Ventricular Branch Vessel is small in size.  First Right Posterolateral Branch Vessel is small in size.  Intervention  No interventions have been documented.   CARDIAC CATHETERIZATION  CARDIAC CATHETERIZATION 02/15/2021  Conclusion Findings:  RA = 2 RV = 17/2 PA = 17/4 (8) PCW = 4 Fick cardiac output/index = 3.0/2.4 Thermo CO/CI = 2.8/2.2 PVR = 1.3 Ao sat = 98% PA sat = 64%, 65%  Assessment: 1. Low filling pressures with moderately reduced cardiac output  Plan/Discussion:  Hydrate gently. No role for inotropic support at this time.  Toribio Fuel, MD 2:24 PM     ECHOCARDIOGRAM  ECHOCARDIOGRAM COMPLETE 04/20/2024  Narrative ECHOCARDIOGRAM REPORT    Patient Name:   Colleen Fleming Date of Exam: 04/20/2024 Medical Rec #:  [id number]     Height:       58.0 in Accession #:    [id number]    Weight:       106.5 lb Date of Birth:  Aug 07, 1956     BSA:  1.393 m Patient Age:    68 years      BP:           65/53 mmHg Patient Gender: F             HR:           99 bpm. Exam Location:  Inpatient  Procedure: 2D Echo, Cardiac Doppler and Color Doppler (Both Spectral and Color Flow Doppler were utilized during procedure).  Indications:    Acute myocardial infarction, unspecified I21.9  History:        Patient has prior history of Echocardiogram examinations, most recent 08/23/2021. CHF and Cardiomyopathy; CAD and Previous Myocardial Infarction.  Sonographer:    Jayson Gaskins Referring Phys: [id number] CHRISTOPHER D MCALHANY  IMPRESSIONS   1. No left  ventricular thrombus is seen.. Left ventricular ejection fraction, by estimation, is 20 to 25%. The left ventricle has severely decreased function. The left ventricle demonstrates regional wall motion abnormalities (see scoring diagram/findings for description). The left ventricular internal cavity size was mildly dilated. Left ventricular diastolic parameters are consistent with Grade II diastolic dysfunction (pseudonormalization). Elevated left atrial pressure. There is mild dyskinesis of the left ventricular, entire apical segment. 2. Right ventricular systolic function is normal. The right ventricular size is normal. 3. Left atrial size was moderately dilated. 4. The mitral valve is normal in structure. Mild mitral valve regurgitation. No evidence of mitral stenosis. 5. Tricuspid valve regurgitation is moderate. 6. The aortic valve is tricuspid. Aortic valve regurgitation is not visualized. No aortic stenosis is present.  Comparison(s): Prior images reviewed side by side. The left ventricular function is worsened. The left ventricular wall motion abnormalities are worse.  FINDINGS Left Ventricle: No left ventricular thrombus is seen. Left ventricular ejection fraction, by estimation, is 20 to 25%. The left ventricle has severely decreased function. The left ventricle demonstrates regional wall motion abnormalities. Mild dyskinesis of the left ventricular, entire apical segment. The left ventricular internal cavity size was mildly dilated. There is no left ventricular hypertrophy. Left ventricular diastolic parameters are consistent with Grade II diastolic dysfunction (pseudonormalization). Elevated left atrial pressure.   LV Wall Scoring: The apical lateral segment, apical septal segment, and apex are dyskinetic. The mid and distal anterior wall, mid anteroseptal segment, mid anterolateral segment, mid inferoseptal segment, and apical inferior segment are akinetic. The inferior wall,  posterior wall, basal anteroseptal segment, basal anterior segment, and basal inferoseptal segment are normal.  Right Ventricle: The right ventricular size is normal. No increase in right ventricular wall thickness. Right ventricular systolic function is normal.  Left Atrium: Left atrial size was moderately dilated.  Right Atrium: Right atrial size was normal in size.  Pericardium: There is no evidence of pericardial effusion.  Mitral Valve: The mitral valve is normal in structure. Mild mitral valve regurgitation. No evidence of mitral valve stenosis.  Tricuspid Valve: The tricuspid valve is normal in structure. Tricuspid valve regurgitation is moderate.  Aortic Valve: The aortic valve is tricuspid. Aortic valve regurgitation is not visualized. No aortic stenosis is present. Aortic valve mean gradient measures 5.0 mmHg. Aortic valve peak gradient measures 7.5 mmHg. Aortic valve area, by VTI measures 1.57 cm.  Pulmonic Valve: The pulmonic valve was grossly normal. Pulmonic valve regurgitation is trivial. No evidence of pulmonic stenosis.  Aorta: The aortic root is normal in size and structure.  IAS/Shunts: No atrial level shunt detected by color flow Doppler.  Additional Comments: A device lead is visualized in the right ventricle and right atrium.  LEFT VENTRICLE PLAX 2D LVIDd:         4.10 cm   Diastology LV PW:         1.00 cm   LV e' medial:    6.85 cm/s LV IVS:        0.70 cm   LV E/e' medial:  16.9 LVOT diam:     1.70 cm   LV e' lateral:   10.00 cm/s LV SV:         37        LV E/e' lateral: 11.6 LV SV Index:   27 LVOT Area:     2.27 cm   RIGHT VENTRICLE RV S prime:     14.40 cm/s TAPSE (M-mode): 1.6 cm  LEFT ATRIUM             Index        RIGHT ATRIUM          Index LA Vol (A2C):   45.2 ml 32.44 ml/m  RA Area:     9.83 cm LA Vol (A4C):   51.1 ml 36.68 ml/m  RA Volume:   20.20 ml 14.50 ml/m LA Biplane Vol: 50.1 ml 35.96 ml/m AORTIC VALVE AV Area (Vmax):     1.39 cm AV Area (Vmean):   1.38 cm AV Area (VTI):     1.57 cm AV Vmax:           137.00 cm/s AV Vmean:          102.000 cm/s AV VTI:            0.236 m AV Peak Grad:      7.5 mmHg AV Mean Grad:      5.0 mmHg LVOT Vmax:         83.80 cm/s LVOT Vmean:        62.100 cm/s LVOT VTI:          0.163 m LVOT/AV VTI ratio: 0.69  AORTA Ao Root diam: 2.50 cm  MITRAL VALVE                TRICUSPID VALVE MV Area (PHT): 4.17 cm     TR Peak grad:   27.9 mmHg MV Decel Time: 182 msec     TR Vmax:        264.00 cm/s MV E velocity: 116.00 cm/s MV A velocity: 75.50 cm/s   SHUNTS MV E/A ratio:  1.54         Systemic VTI:  0.16 m Systemic Diam: 1.70 cm  Jerel Croitoru MD Electronically signed by Jerel Balding MD Signature Date/Time: 04/20/2024/12:38:11 PM    Final          ______________________________________________________________________________________________       Current Reported Medications:.    No outpatient medications have been marked as taking for the 06/13/24 encounter (Appointment) with Kamaury Cutbirth D, NP.    Physical Exam:    VS:  There were no vitals taken for this visit.   Wt Readings from Last 3 Encounters:  04/21/24 114 lb 10.2 oz (52 kg)  11/21/23 105 lb (47.6 kg)  07/22/22 107 lb 5.8 oz (48.7 kg)    GEN: Well nourished, well developed in no acute distress NECK: No JVD; No carotid bruits CARDIAC: ***RRR, no murmurs, rubs, gallops RESPIRATORY:  Clear to auscultation without rales, wheezing or rhonchi  ABDOMEN: Soft, non-tender, non-distended EXTREMITIES:  No edema; No acute deformity     Asessement and Plan:.    CAD: Patient presented in 04/2024  with late presentation of anterior STEMI.  LHC noted occlusion of previously stented proximal LAD, normal filling pressures, EF severely reduced 2025% with anterior wall motion abnormalities.  Losartan  and metoprolol  were stopped given hypotension patient was started on midodrine  10 mg 3 times daily for blood  pressure support. Palliative care?  Hypotension:Blood pressure today   HFrEF: Echo on 04/20/2024 indicated LVEF 20 to 25%, RWMA, G2 DD, RV systolic function and size was normal, tricuspid valve regurgitation was moderate.  Patient was seen by heart failure service and given failure to thrive and significant hypotension prior GDMT was discontinued and patient was started on midodrine .    Disposition: F/u with ***  Signed, Magdalene Tardiff D Baylen Dea, NP

## 2024-06-13 ENCOUNTER — Ambulatory Visit: Attending: Cardiology | Admitting: Cardiology

## 2024-06-13 VITALS — BP 98/66 | HR 89 | Ht 59.0 in | Wt 114.0 lb

## 2024-06-13 DIAGNOSIS — I255 Ischemic cardiomyopathy: Secondary | ICD-10-CM | POA: Diagnosis present

## 2024-06-13 DIAGNOSIS — I2510 Atherosclerotic heart disease of native coronary artery without angina pectoris: Secondary | ICD-10-CM | POA: Insufficient documentation

## 2024-06-13 DIAGNOSIS — I5022 Chronic systolic (congestive) heart failure: Secondary | ICD-10-CM | POA: Insufficient documentation

## 2024-06-13 DIAGNOSIS — E7800 Pure hypercholesterolemia, unspecified: Secondary | ICD-10-CM | POA: Diagnosis present

## 2024-06-13 NOTE — Patient Instructions (Signed)
 Medication Instructions:  Your physician recommends that you continue on your current medications as directed. Please refer to the Current Medication list given to you today.  *If you need a refill on your cardiac medications before your next appointment, please call your pharmacy*  Lab Work: NONE If you have labs (blood work) drawn today and your tests are completely normal, you will receive your results only by: MyChart Message (if you have MyChart) OR A paper copy in the mail If you have any lab test that is abnormal or we need to change your treatment, we will call you to review the results.  Testing/Procedures: NONE  Follow-Up: At [facility], you and your health needs are our priority.  As part of our continuing mission to provide you with exceptional heart care, our providers are all part of one team.  This team includes your primary Cardiologist (physician) and Advanced Practice Providers or APPs (Physician Assistants and Nurse Practitioners) who all work together to provide you with the care you need, when you need it.  Your next appointment:   4-5 month(s)  Provider:   Alm Clay, MD   We recommend signing up for the patient portal called MyChart.  Sign up information is provided on this After Visit Summary.  MyChart is used to connect with patients for Virtual Visits (Telemedicine).  Patients are able to view lab/test results, encounter notes, upcoming appointments, etc.  Non-urgent messages can be sent to your provider as well.   To learn more about what you can do with MyChart, go to [email or web address].

## 2024-06-14 ENCOUNTER — Encounter: Payer: Self-pay | Admitting: Cardiology

## 2024-06-17 NOTE — Progress Notes (Signed)
Remote ICD Transmission.

## 2024-08-19 ENCOUNTER — Ambulatory Visit

## 2024-08-28 ENCOUNTER — Ambulatory Visit

## 2024-08-28 DIAGNOSIS — I255 Ischemic cardiomyopathy: Secondary | ICD-10-CM

## 2024-08-30 LAB — CUP PACEART REMOTE DEVICE CHECK
Battery Remaining Longevity: 107 mo
Battery Voltage: 3.02 V
Brady Statistic RV Percent Paced: 0.01 %
Date Time Interrogation Session: 20251126022723
HighPow Impedance: 58 Ohm
Implantable Lead Connection Status: 753985
Implantable Lead Implant Date: 20230217
Implantable Lead Location: 753860
Implantable Lead Model: 6935
Implantable Pulse Generator Implant Date: 20230217
Lead Channel Impedance Value: 399 Ohm
Lead Channel Impedance Value: 475 Ohm
Lead Channel Pacing Threshold Amplitude: 0.5 V
Lead Channel Pacing Threshold Pulse Width: 0.4 ms
Lead Channel Sensing Intrinsic Amplitude: 8.625 mV
Lead Channel Sensing Intrinsic Amplitude: 8.625 mV
Lead Channel Setting Pacing Amplitude: 2 V
Lead Channel Setting Pacing Pulse Width: 0.4 ms
Lead Channel Setting Sensing Sensitivity: 0.3 mV
Zone Setting Status: 755011
Zone Setting Status: 755011

## 2024-09-03 NOTE — Progress Notes (Signed)
 Remote ICD Transmission

## 2024-09-05 ENCOUNTER — Ambulatory Visit: Payer: Self-pay | Admitting: Internal Medicine

## 2024-11-18 ENCOUNTER — Ambulatory Visit

## 2024-11-27 ENCOUNTER — Ambulatory Visit

## 2025-02-17 ENCOUNTER — Ambulatory Visit

## 2025-02-26 ENCOUNTER — Ambulatory Visit

## 2025-05-19 ENCOUNTER — Ambulatory Visit
